# Patient Record
Sex: Female | Born: 1950
Health system: Southern US, Community
[De-identification: ages and names within clinical notes are randomized; demographics above are authoritative.]

## PROBLEM LIST (undated history)

## (undated) DIAGNOSIS — M25552 Pain in left hip: Secondary | ICD-10-CM

## (undated) DIAGNOSIS — I519 Heart disease, unspecified: Secondary | ICD-10-CM

## (undated) DIAGNOSIS — M793 Panniculitis, unspecified: Secondary | ICD-10-CM

## (undated) DIAGNOSIS — Z8711 Personal history of peptic ulcer disease: Secondary | ICD-10-CM

## (undated) DIAGNOSIS — E119 Type 2 diabetes mellitus without complications: Secondary | ICD-10-CM

## (undated) DIAGNOSIS — F419 Anxiety disorder, unspecified: Secondary | ICD-10-CM

## (undated) DIAGNOSIS — R5383 Other fatigue: Secondary | ICD-10-CM

## (undated) DIAGNOSIS — R7303 Prediabetes: Secondary | ICD-10-CM

## (undated) DIAGNOSIS — M1990 Unspecified osteoarthritis, unspecified site: Secondary | ICD-10-CM

## (undated) DIAGNOSIS — K760 Fatty (change of) liver, not elsewhere classified: Secondary | ICD-10-CM

## (undated) DIAGNOSIS — I5189 Other ill-defined heart diseases: Secondary | ICD-10-CM

## (undated) DIAGNOSIS — E739 Lactose intolerance, unspecified: Secondary | ICD-10-CM

## (undated) DIAGNOSIS — D649 Anemia, unspecified: Secondary | ICD-10-CM

## (undated) DIAGNOSIS — Z8719 Personal history of other diseases of the digestive system: Secondary | ICD-10-CM

## (undated) DIAGNOSIS — I10 Essential (primary) hypertension: Secondary | ICD-10-CM

## (undated) DIAGNOSIS — E041 Nontoxic single thyroid nodule: Secondary | ICD-10-CM

## (undated) DIAGNOSIS — G44309 Post-traumatic headache, unspecified, not intractable: Secondary | ICD-10-CM

## (undated) DIAGNOSIS — K5900 Constipation, unspecified: Secondary | ICD-10-CM

## (undated) DIAGNOSIS — D1779 Benign lipomatous neoplasm of other sites: Secondary | ICD-10-CM

## (undated) DIAGNOSIS — E785 Hyperlipidemia, unspecified: Secondary | ICD-10-CM

## (undated) DIAGNOSIS — M549 Dorsalgia, unspecified: Secondary | ICD-10-CM

## (undated) DIAGNOSIS — S069X9A Unspecified intracranial injury with loss of consciousness of unspecified duration, initial encounter: Secondary | ICD-10-CM

## (undated) DIAGNOSIS — E669 Obesity, unspecified: Secondary | ICD-10-CM

## (undated) DIAGNOSIS — F329 Major depressive disorder, single episode, unspecified: Secondary | ICD-10-CM

## (undated) DIAGNOSIS — G43909 Migraine, unspecified, not intractable, without status migrainosus: Secondary | ICD-10-CM

## (undated) DIAGNOSIS — K219 Gastro-esophageal reflux disease without esophagitis: Secondary | ICD-10-CM

## (undated) DIAGNOSIS — S0990XS Unspecified injury of head, sequela: Secondary | ICD-10-CM

## (undated) DIAGNOSIS — F32A Depression, unspecified: Secondary | ICD-10-CM

## (undated) DIAGNOSIS — Z5189 Encounter for other specified aftercare: Secondary | ICD-10-CM

## (undated) DIAGNOSIS — J45909 Unspecified asthma, uncomplicated: Secondary | ICD-10-CM

## (undated) DIAGNOSIS — C187 Malignant neoplasm of sigmoid colon: Secondary | ICD-10-CM

## (undated) DIAGNOSIS — J189 Pneumonia, unspecified organism: Secondary | ICD-10-CM

## (undated) DIAGNOSIS — R0600 Dyspnea, unspecified: Secondary | ICD-10-CM

## (undated) DIAGNOSIS — S069XAA Unspecified intracranial injury with loss of consciousness status unknown, initial encounter: Secondary | ICD-10-CM

## (undated) DIAGNOSIS — E559 Vitamin D deficiency, unspecified: Secondary | ICD-10-CM

## (undated) DIAGNOSIS — E269 Hyperaldosteronism, unspecified: Secondary | ICD-10-CM

## (undated) DIAGNOSIS — Z87442 Personal history of urinary calculi: Secondary | ICD-10-CM

## (undated) DIAGNOSIS — T7840XA Allergy, unspecified, initial encounter: Secondary | ICD-10-CM

## (undated) DIAGNOSIS — E079 Disorder of thyroid, unspecified: Secondary | ICD-10-CM

## (undated) HISTORY — DX: Anxiety disorder, unspecified: F41.9

## (undated) HISTORY — DX: Personal history of other diseases of the digestive system: Z87.19

## (undated) HISTORY — DX: Personal history of peptic ulcer disease: Z87.11

## (undated) HISTORY — PX: MYOMECTOMY: SHX85

## (undated) HISTORY — PX: FACET JOINT INJECTION: SHX5016

## (undated) HISTORY — DX: Unspecified osteoarthritis, unspecified site: M19.90

## (undated) HISTORY — DX: Constipation, unspecified: K59.00

## (undated) HISTORY — DX: Major depressive disorder, single episode, unspecified: F32.9

## (undated) HISTORY — DX: Dorsalgia, unspecified: M54.9

## (undated) HISTORY — DX: Hyperaldosteronism, unspecified: E26.9

## (undated) HISTORY — DX: Hyperlipidemia, unspecified: E78.5

## (undated) HISTORY — DX: Type 2 diabetes mellitus without complications: E11.9

## (undated) HISTORY — DX: Depression, unspecified: F32.A

## (undated) HISTORY — DX: Encounter for other specified aftercare: Z51.89

## (undated) HISTORY — PX: TUBAL LIGATION: SHX77

## (undated) HISTORY — PX: ABDOMINAL HYSTERECTOMY: SHX81

## (undated) HISTORY — DX: Obesity, unspecified: E66.9

## (undated) HISTORY — DX: Unspecified asthma, uncomplicated: J45.909

## (undated) HISTORY — DX: Allergy, unspecified, initial encounter: T78.40XA

## (undated) HISTORY — DX: Migraine, unspecified, not intractable, without status migrainosus: G43.909

## (undated) HISTORY — DX: Lactose intolerance, unspecified: E73.9

## (undated) HISTORY — DX: Gastro-esophageal reflux disease without esophagitis: K21.9

---

## 1987-11-29 HISTORY — PX: THYROIDECTOMY: SHX17

## 2001-11-28 HISTORY — PX: BREAST SURGERY: SHX581

## 2002-05-15 ENCOUNTER — Emergency Department (HOSPITAL_COMMUNITY): Admission: EM | Admit: 2002-05-15 | Discharge: 2002-05-15 | Payer: Self-pay | Admitting: Emergency Medicine

## 2002-07-08 ENCOUNTER — Emergency Department (HOSPITAL_COMMUNITY): Admission: EM | Admit: 2002-07-08 | Discharge: 2002-07-08 | Payer: Self-pay | Admitting: Emergency Medicine

## 2007-10-12 ENCOUNTER — Ambulatory Visit: Payer: Self-pay | Admitting: Gastroenterology

## 2007-10-22 ENCOUNTER — Ambulatory Visit: Payer: Self-pay | Admitting: Gastroenterology

## 2008-01-24 ENCOUNTER — Ambulatory Visit (HOSPITAL_COMMUNITY): Admission: RE | Admit: 2008-01-24 | Discharge: 2008-01-24 | Payer: Self-pay | Admitting: Neurology

## 2008-02-20 ENCOUNTER — Ambulatory Visit (HOSPITAL_COMMUNITY): Admission: RE | Admit: 2008-02-20 | Discharge: 2008-02-20 | Payer: Self-pay | Admitting: Neurology

## 2008-07-04 ENCOUNTER — Emergency Department (HOSPITAL_COMMUNITY): Admission: EM | Admit: 2008-07-04 | Discharge: 2008-07-04 | Payer: Self-pay | Admitting: Emergency Medicine

## 2008-11-28 HISTORY — PX: CHALAZION EXCISION: SHX213

## 2009-01-09 ENCOUNTER — Observation Stay (HOSPITAL_COMMUNITY): Admission: EM | Admit: 2009-01-09 | Discharge: 2009-01-11 | Payer: Self-pay | Admitting: Emergency Medicine

## 2009-03-28 HISTORY — PX: NM MYOCAR PERF WALL MOTION: HXRAD629

## 2009-03-28 HISTORY — PX: TRANSTHORACIC ECHOCARDIOGRAM: SHX275

## 2009-10-08 ENCOUNTER — Emergency Department (HOSPITAL_COMMUNITY): Admission: EM | Admit: 2009-10-08 | Discharge: 2009-10-08 | Payer: Self-pay | Admitting: Emergency Medicine

## 2011-03-15 LAB — PHOSPHORUS
Phosphorus: 3.4 mg/dL (ref 2.3–4.6)
Phosphorus: 4 mg/dL (ref 2.3–4.6)

## 2011-03-15 LAB — BASIC METABOLIC PANEL
BUN: 10 mg/dL (ref 6–23)
BUN: 12 mg/dL (ref 6–23)
BUN: 14 mg/dL (ref 6–23)
CO2: 27 mEq/L (ref 19–32)
Calcium: 8.9 mg/dL (ref 8.4–10.5)
Chloride: 104 mEq/L (ref 96–112)
Chloride: 107 mEq/L (ref 96–112)
Creatinine, Ser: 0.59 mg/dL (ref 0.4–1.2)
GFR calc non Af Amer: >60 mL/min (ref >60)
Glucose, Bld: 104 mg/dL — ABNORMAL HIGH (ref 70–99)
Glucose, Bld: 113 mg/dL — ABNORMAL HIGH (ref 70–99)
Glucose, Bld: 88 mg/dL (ref 70–99)
Potassium: 3.4 mEq/L — ABNORMAL LOW (ref 3.5–5.1)
Potassium: 3.5 mEq/L (ref 3.5–5.1)

## 2011-03-15 LAB — LIPID PANEL
LDL Cholesterol: 92 mg/dL (ref 0–99)
Total CHOL/HDL Ratio: 4.1 RATIO
Triglycerides: 181 mg/dL — ABNORMAL HIGH (ref <150)
VLDL: 36 mg/dL (ref 0–40)

## 2011-03-15 LAB — DIFFERENTIAL
Basophils Absolute: 0 10*3/uL (ref 0.0–0.1)
Eosinophils Relative: 3 % (ref 0–5)
Lymphocytes Relative: 42 % (ref 12–46)
Lymphs Abs: 1.6 10*3/uL (ref 0.7–4.0)
Neutro Abs: 1.6 10*3/uL — ABNORMAL LOW (ref 1.7–7.7)
Neutrophils Relative %: 43 % (ref 43–77)

## 2011-03-15 LAB — CBC
HCT: 33.7 % — ABNORMAL LOW (ref 36.0–46.0)
HCT: 34.9 % — ABNORMAL LOW (ref 36.0–46.0)
MCV: 88.1 fL (ref 78.0–100.0)
MCV: 88.7 fL (ref 78.0–100.0)
Platelets: 229 10*3/uL (ref 150–400)
Platelets: 249 10*3/uL (ref 150–400)
Platelets: 269 10*3/uL (ref 150–400)
RDW: 13.2 % (ref 11.5–15.5)
RDW: 13.5 % (ref 11.5–15.5)
WBC: 3.6 10*3/uL — ABNORMAL LOW (ref 4.0–10.5)
WBC: 3.8 10*3/uL — ABNORMAL LOW (ref 4.0–10.5)

## 2011-03-15 LAB — POCT CARDIAC MARKERS: CKMB, poc: <1 ng/mL — ABNORMAL LOW (ref 1.0–8.0)

## 2011-03-15 LAB — URINALYSIS, ROUTINE W REFLEX MICROSCOPIC
Ketones, ur: NEGATIVE mg/dL
Nitrite: NEGATIVE
Specific Gravity, Urine: 1.01 (ref 1.005–1.030)
pH: 6.5 (ref 5.0–8.0)

## 2011-03-15 LAB — TSH: TSH: 0.62 u[IU]/mL (ref 0.350–4.500)

## 2011-03-15 LAB — CARDIAC PANEL(CRET KIN+CKTOT+MB+TROPI)
CK, MB: 0.9 ng/mL (ref 0.3–4.0)
Relative Index: 0.8 (ref 0.0–2.5)

## 2011-03-15 LAB — PROTIME-INR
INR: 1.1 (ref 0.00–1.49)
Prothrombin Time: 14.3 seconds (ref 11.6–15.2)

## 2011-03-15 LAB — HEPATIC FUNCTION PANEL
ALT: 20 U/L (ref 0–35)
Alkaline Phosphatase: 74 U/L (ref 39–117)
Bilirubin, Direct: <0.1 mg/dL (ref 0.0–0.3)

## 2011-03-15 LAB — HEMOGLOBIN A1C: Mean Plasma Glucose: 120 mg/dL

## 2011-04-12 NOTE — H&P (Signed)
[name], [name] NO.:  [id number]   MEDICAL RECORD NO.:  [id number]          PATIENT TYPE:  OBV   LOCATION:                               FACILITY:  [facility]   PHYSICIAN:  Herbie Saxon, MDDATE OF BIRTH:  01/01/1968   DATE OF ADMISSION:  01/09/2009  DATE OF DISCHARGE:  01/11/2009                              HISTORY & PHYSICAL   PRIMARY CARE PHYSICIAN:  Pearline Cables, MD.   She has no assigned health care power of attorney and she is a full  code.   PRESENTING COMPLAINTS:  Left-sided chest pain, shortness of breath and  headache 1 day's duration.   HISTORY OF PRESENT ILLNESS:  This 60 year old African American female  has been experiencing intermittent left-sided chest pain for the last 1  week.  Per the patient, overnight she had severe intensity left-sided  chest pain, 9-10/10, tightening and dull and pressure-like in nature  radiating to her left arm, associated with shortness of breath,  dizziness, no diaphoresis, some sensation of palpitation.  No syncopal  episode.  Patient also noticed increasing headache.  She denies any body  swelling, no orthopnea, paroxysmal nocturnal dyspnea.  She has not been  having increasing heartburn or abdominal discomfort.  No change in bowel  habits.  No symptoms refferable to the genitourinary systems.  Patient  denies any cough, wheezing or hemoptysis.  There is no new joint  swelling or skin rash.   FAMILY HISTORY:  There is a strong family of coronary artery disease;  mother, myocardial infarction at age 87; uncle, myocardial infarction at  age 60; and grandmother, myocardial infarction at age 45.   PAST SURGICAL HISTORY:  Nil of note.   PAST MEDICAL HISTORY:  1. Hypertension diagnosed 20 years ago.  2. Migraines diagnosed 1-1/2 years ago.   SOCIAL HISTORY:  She lives with her children.  Currently on sick-leave.  No history of alcohol or tobacco, no drug abuse.   FAMILY HISTORY:  As stated, mother  - myocardial infarction, uncle -  myocardial infarction and grandmother - myocardial infarction.   REVIEW OF SYSTEMS:  A 14-point review of systems was performed with the  patient.  Pertinent positives as in the history of presenting  complaints.   ALLERGIES:  No known drug allergies.   MEDICATIONS:  1. Procardia 90 mg daily.  2. Pristiq 100 mg daily.  3. Keppra 500 mg b.i.d.  4. Seroquel 25 mg daily.  5. Etodolac 400-800 mg daily as needed.  6. Levoxyl 0.88 mcg daily.   PHYSICAL EXAMINATION:  VITAL SIGNS:  Temperature 98, pulse 80,  respiratory rate 20, blood pressure 136/63.  GENERAL APPEARANCE:  She is a middle-aged lady not in acute respiratory  distress.  HEENT:  Head is atraumatic and normocephalic.  Mucous membranes are  moist.  Pupils are equal, reactive to light and accommodation.  Extraocular muscles are intact.  No appreciated asymmetry.  Oropharynx  is clear.  NECK:  Supple.  No lymphadenopathy.  No thyromegaly.  No elevated JVD.  CHEST WALL:  No deformities, no tenderness.  Bilateral air entry is  fair, no rales or rhonchi.  CARDIOVASCULAR:  Heart sounds 1 and 2, regular rate and rhythm.  No  murmurs, rubs, or gallops.  Apex beat is in the fifth intercostal space  lateral to the mid clavicular line.  No parasternal heave.  ABDOMEN:  Soft, nontender, bowel sounds present, no organomegaly, no  inguinal hernia.  EXTREMITIES:  Peripheral pulses with no pedal edema, no calf tenderness,  no erythema, clubbing or cyanosis.  NEUROLOGIC:  She is alert and oriented to time, place and person.  Cranial nerves II-XII intact.  Power is 5 in all limbs. Deep tendon  reflexes 2 globally.  Sensory system normal.   LABORATORY DATA:  WBC 3.8, hematocrit 39, platelet count 269.   EKG shows normal sinus rhythm  with moderate left ventricular  hypertrophy.  Initial troponin is less than 0.05.   Sodium 134, potassium 3.5, chloride 108, bicarbonate 26, BUN 10,  creatinine 0.59,  glucose 104.  Urinalysis is negative.   Chest x-ray shows linear lingular scarring, borderline cardiomegaly.   ASSESSMENT:  1. Atypical chest pain query acute coronary syndrome.  2. Migraine.  3. Mild neutropenia.  4. Depression.  5. Family history of coronary artery disease.  6. Hypertension.  7. Obesity.  8. Anxiety.   PLAN:  Patient will be admitted to telemetry bed the initial 24 hours.  Will obtain serial cardiac enzymes and EKG.  Patient will be sent for an  echocardiogram.  We will check fasting lipids, thyroid function,  coagulation parameters.   DIET:  Heart healthy.   ACTIVITY:  Bed rest.   MEDICATIONS:  1. Morphine IV p.r.n. for severe chest pain.  2. Use of Proventil to keep sats greater than 90.  3. Nitroglycerin paste half inch q.6 h.  Hold in between 6 a.m. to      noon daily.  4. Aspirin 325 mg daily.  5. Lovenox 40 mg subcu daily for DVT prophylaxis.  6. Protonix 40 mg IV daily.  7. IV fluid to keep vein open.  8. Continue home medications except for Procardia which will be held.  9. Will start on metoprolol 25 mg p.o. b.i.d. and lisinopril 5 mg      daily.  10.Lopressor 2.5 mg IV q.6 h p.r.n. will be given if blood pressure      greater than 160/110 or irregular heart rate greater than 110 per      minute.  11.DuoNeb on unit dose q.6 h p.r.n. shortness of breath.   The patient's illness, medication, tests and treatment plan explained to  her, she verbalizes understanding.   ENCOUNTER TIME:  50 minutes.      Herbie Saxon, MD  Electronically Signed     MIO/MEDQ  D:  01/09/2009  T:  01/09/2009  Job:  [id number]

## 2011-04-12 NOTE — Procedures (Signed)
EEG NUMBER:  [id number]   HISTORY:  This is a 60 year old patient with a history of a right  occipital concussion, having trouble with memory loss.  This is a sleep-  deprived EEG recording.  No skull defects were noted.   EEG CLASSIFICATION:  Normal awake and asleep.   DESCRIPTION OF RECORDING:  The background rhythm of this recording  consists of a fairly well-modulated, medium-amplitude alpha rhythm of 8  Hz that is reactive to eye opening and closure.  As the record  progresses, the patient initially is in the waking state.  Photic  stimulation is performed, resulting in a bilateral and symmetric photic  driving response.  Hyperventilation is then performed, resulting in a  buildup of background rhythm activities with minimal background slowing  seen.  As the record progresses, the patient towards the end of the  recording enters early stage II sleep with rudimentary sleep spindles  and K complexes seen.  At no time during the recording does there appear  to be evidence of spikes, spike-wave discharges or evidence of focal  slowing.  EKG monitor shows no evidence of cardiac rhythm abnormalities  with a heart rate of 56.   IMPRESSION:  This is a normal EEG recording in the awake and sleeping  state.  No evidence of ictal or interictal discharges were seen.      Marlan Palau, M.D.  Electronically Signed     AOZ:HYQM  D:  02/20/2008 15:42:00  T:  02/21/2008 09:15:48  Job #:  [id number]

## 2011-04-12 NOTE — Procedures (Signed)
EEG NUMBER:  [id number].   REFERRING PHYSICIAN:  Rene Kocher, M.D.   CLINICAL HISTORY:  A 60 year old lady with episodes of altered awareness  following a fall in November 2008.   MEDICATIONS LISTED:  None.   This is a 17-channel EEG recorded with the patient awake and drowsy  using standard 10/20 electrode placement.   Background awake rhythm consists of 10 to 11 Hz alpha, which is of  excellent amplitude, synchronous, reactive to eye-opening and closure.  Intermittent bilateral central and temporal sharp waves are seen,  particularly during drowsiness, but no definite epileptiform activity is  noted.  Definite sleep stages are not seen except mild drowsiness, which  showed prominent sharp waves as stated above.  Length of the recording  is 23 minutes.  Technical component is excellent.  Hyperventilation and  photic stimulation are both  unremarkable.  EKG tracing reveals regular  sinus rhythm.   IMPRESSION:  This EEG performed during awake and drowsy states is  abnormal due to the presence of mild bilateral cortical irritability;  however, no definitive epileptiform features were noted.   If seizures are suspected, repeat sleep-deprived study may be  beneficial.           ______________________________  Sunny Schlein. Pearlean Brownie, MD     GEX:BMWU  D:  01/24/2008 19:10:33  T:  01/25/2008 18:03:40  Job #:  [id number]   cc:   Rene Kocher, M.D.  Fax: [phone]

## 2011-04-12 NOTE — Discharge Summary (Signed)
[name], [name]        ACCOUNT NO.:  [id number]   MEDICAL RECORD NO.:  [id number]          PATIENT TYPE:  OBV   LOCATION:  1401                         FACILITY:  [facility]   PHYSICIAN:  Herbie Saxon, MDDATE OF BIRTH:  Mar 05, 1951   DATE OF ADMISSION:  01/09/2009  DATE OF DISCHARGE:                               DISCHARGE SUMMARY   DISCHARGE DIAGNOSES:  1. Chest pain, atypical.  2. Migraine headache.  3. Mild neutropenia.  4. Depression.  5. Anxiety.  6. Mild fatty liver.  7. Family history of coronary artery disease.  8. Obesity.  9. Hypertension, stable.  10.Mild anemia of chronic disease.   RADIOLOGY:  The chest x-ray of January 09, 2009, shows borderline  cardiomegaly with linear lingular scarring or atelectasis.  Abdominal  ultrasound of January 11, 2009, shows a questionable mild fatty  infiltration of the liver.   HOSPITAL COURSE:  This 60 year old lady with a history of hypertension  and migraine headache, presented with left-sided chest pain, shortness  of breath, and headache of 1 day duration.  An initial EKG on this  admission shows moderate left ventricular hypertrophy; however, serial  cardiac enzymes and EKG have been negative.  Hemoccult has also been  negative.  A two-D echo is pending.  The patient is chest pain free and  there is no shortness of breath.  A lipid panel did show  hyperglyceridemia and she has been started on Niacin and aspirin for  this.  Migraine headache has improved with Fioricet p.r.n.  She did have  hypokalemia, which has been supplemented.  She is being discharged home  to follow up with her primary care physician in the next 1 to 3 days.  The primary care physician will arrange outpatient stress echo and  Cardiology followup as an outpatient.   CONDITION ON DISCHARGE:  Stable.   DIET:  Heart healthy, low cholesterol.   ACTIVITY:  To be increased slowly as tolerated.  The patient to report  to the emergency room  in the case of recurrence of any chest pain,  shortness of breath, or diaphoresis.  Follow up with her primary care  physician, Dr. Shanda Bumps Copland in the next 1 to 3 days.   MEDICATIONS ON DISCHARGE:  1. Nitroglycerin 0.4 mg sublingual p.r.n.  2. Enteric-coated aspirin 81 mg daily.  3. Prilosec 20 mg daily.  4. Lisinopril 5 mg daily.  5. Toprol-XL 25 mg daily.  6. Fioricet 1 to 2 tablets q.6 hours p.r.n.  7. Niacin 100 mg b.i.d.   Continue with her home medications of:  1. Pristiq 100 mg daily.  2. Keppra 500 mg b.i.d.  3. Seroquel 25 mg daily.  4. Etodolac 400 to 800 mg daily as needed.  5. Levoxyl 0.88 mg daily.   DISCHARGE PHYSICAL EXAMINATION:  GENERAL:  On examination today, she is  a middle-aged lady not in acute distress.  VITAL SIGNS:  Temperature 97.4, pulse is 60, respiratory rate is 18,  blood pressure 120/70.  HEENT:  Pupils are equal and reactive to light and accommodation.  Head  is atraumatic, normocephalic.  Mucous membranes are moist.  Oropharynx  and  nasopharynx are clear.  NECK:  Supple, no lymphadenopathy, no thyromegaly or carotid bruit, JVD  is not elevated.  CHEST:  Clinically clear, and no rales or rhonchi.  CARDIAC:  Heart sounds 1 and 2 regular rate and rhythm.  ABDOMEN:  Benign.  CNS:  No neurologic deficits.  EXTREMITIES:  Peripheral pulses present, no pedal edema.   DISCHARGE LABORATORY DATA:  Chemistry:  Sodium is 137, potassium 3.5,  chloride 104, bicarbonate 29, glucose 88, BUN 12, creatinine 0.6.  Complete blood count:  WBC is 3.6, hematocrit 33.7, platelet count is  229.  Troponin less than 0.01.  Thyroid function test is normal.  Homocystine is normal.   Discharge time less than 30 minutes.      Herbie Saxon, MD  Electronically Signed     MIO/MEDQ  D:  01/11/2009  T:  01/11/2009  Job:  [id number]

## 2011-12-12 ENCOUNTER — Ambulatory Visit (INDEPENDENT_AMBULATORY_CARE_PROVIDER_SITE_OTHER): Payer: BC Managed Care – PPO

## 2011-12-12 DIAGNOSIS — D2310 Other benign neoplasm of skin of unspecified eyelid, including canthus: Secondary | ICD-10-CM

## 2012-01-18 ENCOUNTER — Ambulatory Visit (INDEPENDENT_AMBULATORY_CARE_PROVIDER_SITE_OTHER): Payer: BC Managed Care – PPO | Admitting: Family Medicine

## 2012-01-18 VITALS — BP 150/86 | HR 60 | Temp 97.6°F | Resp 18 | Ht 63.25 in | Wt 212.0 lb

## 2012-01-18 DIAGNOSIS — E039 Hypothyroidism, unspecified: Secondary | ICD-10-CM

## 2012-01-18 DIAGNOSIS — G43909 Migraine, unspecified, not intractable, without status migrainosus: Secondary | ICD-10-CM | POA: Insufficient documentation

## 2012-01-18 DIAGNOSIS — F329 Major depressive disorder, single episode, unspecified: Secondary | ICD-10-CM | POA: Insufficient documentation

## 2012-01-18 DIAGNOSIS — K219 Gastro-esophageal reflux disease without esophagitis: Secondary | ICD-10-CM | POA: Insufficient documentation

## 2012-01-18 DIAGNOSIS — M549 Dorsalgia, unspecified: Secondary | ICD-10-CM

## 2012-01-18 DIAGNOSIS — I10 Essential (primary) hypertension: Secondary | ICD-10-CM | POA: Insufficient documentation

## 2012-01-18 MED ORDER — HYDROCODONE-ACETAMINOPHEN 5-500 MG PO TABS: 1.0000 | ORAL_TABLET | Freq: Three times a day (TID) | ORAL | Status: AC | PRN

## 2012-01-18 MED ORDER — CYCLOBENZAPRINE HCL 10 MG PO TABS: 10.0000 mg | ORAL_TABLET | Freq: Two times a day (BID) | ORAL | Status: AC | PRN

## 2012-01-18 MED ORDER — PREDNISONE 20 MG PO TABS: ORAL_TABLET | ORAL | Status: DC

## 2012-01-18 NOTE — Patient Instructions (Signed)
Call Dr. Juanda Chance office asap- [phone] ([facility]) and schedule an appointment.   We have prescribed you flexeril and vicodin (hydrocodone) today.  Remember that these medications can make you sleepy.  Do not take other narcotic or tylenol containing pain medications while you are taking the vicodin.

## 2012-01-18 NOTE — Progress Notes (Signed)
  Patient Name: [name] Date of Birth: [birth year] Medical Record Number: [id number] Gender: female Date of Encounter: 01/18/2012  History of Present Illness:  [name] is a 61 y.o. very pleasant female patient who presents with the following:  Continues to have severe lower back pain.  Has a history of facet disease and has received injections by Dr. Regino Schultze in the past.  Was here in January- received tramadol which helps some but she still wakes up in the night and needs to take another.  Pain has been worse for the last month or so.  His seems very similar to the pain that took her to Dr. Regino Schultze before.  No radiation of pain down her legs, no numbness or tingling.  Riding in a car is painful because of "the vibrations."  Is currently using ultram and robaxim for her symptoms.  Pain is especially bad if she has to change position.  Also had tried some hydrocodone which was left over from a foot operation- this relieved her pain better.   Patient Active Problem List  Diagnoses  . Hypothyroidism (acquired)  . Hypertension  . GERD (gastroesophageal reflux disease)  . Depression  . Migraine   History reviewed. No pertinent past medical history. Past Surgical History  Procedure Date  . Facet joint injection 2011 and 2012  . Breast surgery 2003    reduction  . Thyroidectomy 1989  . Tubal ligation   . Abdominal hysterectomy   . Myomectomy    History  Substance Use Topics  . Smoking status: Never Smoker   . Smokeless tobacco: Never Used  . Alcohol Use: Yes     wine on weekends   Family History  Problem Relation Age of Onset  . Heart disease Mother   . Heart disease Maternal Grandmother   . Heart disease Maternal Grandfather   . Heart disease Paternal Grandmother   . Heart disease Paternal Grandfather    Allergies  Allergen Reactions  . Toprol Xl (Metoprolol Succinate)     Medication list has been reviewed and updated.  Review of Systems: As per  HPI- otherwise negative.  Physical Examination: Filed Vitals:   01/18/12 1044  BP: 150/86  Pulse: 60  Temp: 97.6 F (36.4 C)  TempSrc: Oral  Resp: 18  Height: 5' 3.25" (1.607 m)  Weight: 212 lb (96.163 kg)    Body mass index is 37.26 kg/(m^2).  GEN: WDWN, NAD, Non-toxic, A & O x 3 HEENT: Atraumatic, Normocephalic. Neck supple. No masses, No LAD. Ears and Nose: No external deformity. CV: RRR, No M/G/R. No JVD. No thrill. No extra heart sounds. PULM: CTA B, no wheezes, crackles, rhonchi. No retractions. No resp. distress. No accessory muscle use. ABD: S, NT, ND, +BS. No rebound. No HSM. EXTR: No c/c/e NEURO Normal gait.  PSYCH: Normally interactive. Conversant. Not depressed or anxious appearing.  Calm demeanor.  Back: exquisite tenderness at both facet joints.    Assessment and Plan: 1. Back pain  cyclobenzaprine (FLEXERIL) 10 MG tablet, HYDROcodone-acetaminophen (VICODIN) 5-500 MG per tablet, predniSONE (DELTASONE) 20 MG tablet  2. Hypothyroidism (acquired)    3. Hypertension    4. GERD (gastroesophageal reflux disease)    5. Depression    6. Migraine     Continued facet diease as above- will treat with flexeril and vicodin as needed, and she will schedule an appt to see Dr. Regino Schultze as soon as possible.

## 2012-03-09 ENCOUNTER — Other Ambulatory Visit: Payer: Self-pay | Admitting: Family Medicine

## 2012-03-10 ENCOUNTER — Telehealth: Payer: Self-pay

## 2012-03-10 NOTE — Telephone Encounter (Signed)
.  [facility] Patient called and stated that she was supposed to have medication called into [facility] on [address] and it was not done.  No previous messages noted regarding this patient. Please call patient at [phone] regarding this request.

## 2012-03-10 NOTE — Telephone Encounter (Signed)
ADVISED PT THAT RX WAS FAXED IN 

## 2012-08-14 ENCOUNTER — Other Ambulatory Visit: Payer: Self-pay | Admitting: Family Medicine

## 2012-10-24 ENCOUNTER — Other Ambulatory Visit: Payer: Self-pay | Admitting: *Deleted

## 2012-11-05 ENCOUNTER — Telehealth: Payer: Self-pay

## 2012-11-05 NOTE — Telephone Encounter (Signed)
PT STATES SHE NEED HER 04-29-11 AND 01-09-12 VISITS OV AND ANY LABS,XRAYS OR CULTURES DONE THOSE 2 DAYS. PLEASE CALL [phone]. WAS TOLD IT COULD TAKE UP TO 72HRS.

## 2012-11-06 NOTE — Telephone Encounter (Signed)
Spoke with patient. She needed office notes related to back pain, no labs or cultures. Copied 01/18/12, 12/12/11, 04/29/11, 10/28/10, and 07/20/10 and x-ray report from 10/28/10. She already has x-ray reports from June 2011. Records ready for pickup.

## 2012-11-28 DIAGNOSIS — Z0271 Encounter for disability determination: Secondary | ICD-10-CM

## 2012-12-27 ENCOUNTER — Other Ambulatory Visit: Payer: Self-pay | Admitting: Physician Assistant

## 2013-01-13 ENCOUNTER — Emergency Department (HOSPITAL_COMMUNITY)
Admission: EM | Admit: 2013-01-13 | Discharge: 2013-01-13 | Disposition: A | Discharge status: Home / Self Care | Payer: Worker's Compensation | Attending: Emergency Medicine | Admitting: Emergency Medicine

## 2013-01-13 ENCOUNTER — Encounter (HOSPITAL_COMMUNITY): Payer: Self-pay | Admitting: Emergency Medicine

## 2013-01-13 DIAGNOSIS — Z8679 Personal history of other diseases of the circulatory system: Secondary | ICD-10-CM | POA: Insufficient documentation

## 2013-01-13 DIAGNOSIS — Z87828 Personal history of other (healed) physical injury and trauma: Secondary | ICD-10-CM | POA: Insufficient documentation

## 2013-01-13 DIAGNOSIS — E079 Disorder of thyroid, unspecified: Secondary | ICD-10-CM | POA: Insufficient documentation

## 2013-01-13 DIAGNOSIS — Z79899 Other long term (current) drug therapy: Secondary | ICD-10-CM | POA: Insufficient documentation

## 2013-01-13 DIAGNOSIS — Z7982 Long term (current) use of aspirin: Secondary | ICD-10-CM | POA: Insufficient documentation

## 2013-01-13 DIAGNOSIS — Z791 Long term (current) use of non-steroidal anti-inflammatories (NSAID): Secondary | ICD-10-CM | POA: Insufficient documentation

## 2013-01-13 DIAGNOSIS — G43909 Migraine, unspecified, not intractable, without status migrainosus: Secondary | ICD-10-CM | POA: Insufficient documentation

## 2013-01-13 DIAGNOSIS — I10 Essential (primary) hypertension: Secondary | ICD-10-CM | POA: Insufficient documentation

## 2013-01-13 DIAGNOSIS — K219 Gastro-esophageal reflux disease without esophagitis: Secondary | ICD-10-CM | POA: Insufficient documentation

## 2013-01-13 HISTORY — DX: Essential (primary) hypertension: I10

## 2013-01-13 HISTORY — DX: Unspecified injury of head, sequela: S09.90XS

## 2013-01-13 HISTORY — DX: Disorder of thyroid, unspecified: E07.9

## 2013-01-13 HISTORY — DX: Unspecified injury of head, sequela: G44.309

## 2013-01-13 MED ORDER — DIPHENHYDRAMINE HCL 50 MG/ML IJ SOLN
25.0000 mg | Freq: Once | INTRAMUSCULAR | Status: AC
  Administered 2013-01-13: 25 mg via INTRAVENOUS
  Filled 2013-01-13: qty 1

## 2013-01-13 MED ORDER — METOCLOPRAMIDE HCL 5 MG/ML IJ SOLN
10.0000 mg | Freq: Once | INTRAMUSCULAR | Status: AC
  Administered 2013-01-13: 10 mg via INTRAVENOUS
  Filled 2013-01-13: qty 2

## 2013-01-13 MED ORDER — HYDROMORPHONE HCL PF 1 MG/ML IJ SOLN
1.0000 mg | Freq: Once | INTRAMUSCULAR | Status: AC
  Administered 2013-01-13: 1 mg via INTRAVENOUS
  Filled 2013-01-13: qty 1

## 2013-01-13 MED ORDER — DEXAMETHASONE SODIUM PHOSPHATE 10 MG/ML IJ SOLN
10.0000 mg | Freq: Once | INTRAMUSCULAR | Status: AC
  Administered 2013-01-13: 10 mg via INTRAVENOUS
  Filled 2013-01-13: qty 1

## 2013-01-13 MED ORDER — SODIUM CHLORIDE 0.9 % IV BOLUS (SEPSIS)
1000.0000 mL | Freq: Once | INTRAVENOUS | Status: AC
  Administered 2013-01-13: 1000 mL via INTRAVENOUS

## 2013-01-13 NOTE — ED Notes (Signed)
Patient states that she has had a MHA x 4 -5 days. Has tried all of her own medications. The patient reports Nausea and typical MHA Symtoms

## 2013-01-13 NOTE — ED Provider Notes (Signed)
History     CSN: [id number]  Arrival date & time 01/13/13  1251   First MD Initiated Contact with Patient 01/13/13 1342      Chief Complaint  Patient presents with  . Migraine    (Consider location/radiation/quality/duration/timing/severity/associated sxs/prior treatment) HPI  62 year-old female with history of headache due to an old head injury presents complaining of worsening headache. Patient reports she usually has headache 3 or 4 times weekly. Her headaches usually improves with taking her usual headache cocktail. For the past 4 or 5 days she is having worsening headache that has not improved with her home medication. Describe headaches as a sharp and throbbing sensation to the right parietal region, nonradiating, persistent, moderate in severity, with associate nausea. She has cold symptoms prior to having headache including runny nose, sneezing, and nonproductive cough which has resolved. She denies fever, chills, double vision, next deafness, vomiting, diarrhea, or rash. Headache feels similar to her usual headache.  Past Medical History  Diagnosis Date  . Headaches due to old head injury   . Hypertension   . Thyroid disease   . Reflux     Past Surgical History  Procedure Laterality Date  . Facet joint injection  2011 and 2012  . Breast surgery  2003    reduction  . Thyroidectomy  1989  . Tubal ligation    . Abdominal hysterectomy    . Myomectomy      Family History  Problem Relation Age of Onset  . Heart disease Mother   . Heart disease Maternal Grandmother   . Heart disease Maternal Grandfather   . Heart disease Paternal Grandmother   . Heart disease Paternal Grandfather     History  Substance Use Topics  . Smoking status: Never Smoker   . Smokeless tobacco: Never Used  . Alcohol Use: Yes     Comment: wine on weekends    OB History   Grav Para Term Preterm Abortions TAB SAB Ect Mult Living                  Review of Systems  Constitutional:      10 Systems reviewed and all are negative for acute change except as noted in the HPI.     Allergies  Toprol xl  Home Medications   Current Outpatient Rx  Name  Route  Sig  Dispense  Refill  . aspirin 81 MG tablet   Oral   Take 81 mg by mouth daily.         . butalbital-acetaminophen-caffeine (FIORICET WITH CODEINE) 50-325-40-30 MG per capsule   Oral   Take 1 capsule by mouth every 4 (four) hours as needed.         . desvenlafaxine (PRISTIQ) 100 MG 24 hr tablet   Oral   Take 100 mg by mouth daily.         . diclofenac sodium (VOLTAREN) 1 % GEL   Topical   Apply topically.         . gabapentin (NEURONTIN) 300 MG capsule   Oral   Take 300 mg by mouth 3 (three) times daily.         Marland Kitchen HYDROcodone-acetaminophen (VICODIN) 5-500 MG per tablet      TAKE 1 TABLET BY MOUTH EVERY 8 HOURS AS NEEDED FOR PAIN   30 tablet   0   . indomethacin (INDOCIN SR) 75 MG CR capsule   Oral   Take 75 mg by mouth 2 (two) times daily  with a meal.         . levothyroxine (SYNTHROID, LEVOTHROID) 100 MCG tablet   Oral   Take 100 mcg by mouth daily.         Marland Kitchen losartan (COZAAR) 100 MG tablet      TAKE 1 TABLET BY MOUTH DAILY   30 tablet   0     Needs office visit   . losartan (COZAAR) 50 MG tablet   Oral   Take 100 mg by mouth daily.         Marland Kitchen NIFEdipine (PROCARDIA XL/ADALAT-CC) 60 MG 24 hr tablet   Oral   Take 60 mg by mouth daily.         . nitroGLYCERIN (NITROSTAT) 0.4 MG SL tablet   Sublingual   Place 0.4 mg under the tongue every 5 (five) minutes as needed.         Marland Kitchen omega-3 acid ethyl esters (LOVAZA) 1 G capsule   Oral   Take 1 g by mouth 2 (two) times daily.         Marland Kitchen omeprazole (PRILOSEC) 40 MG capsule   Oral   Take 40 mg by mouth daily.         . phentermine 37.5 MG capsule   Oral   Take 37.5 mg by mouth every morning.         . predniSONE (DELTASONE) 20 MG tablet      Take two daily for 3 days, then one daily for 3 days   9 tablet    0   . tiZANidine (ZANAFLEX) 4 MG capsule   Oral   Take 4 mg by mouth 3 (three) times daily.           BP 140/70  Pulse 69  Temp(Src) 98.4 F (36.9 C) (Oral)  Resp 16  SpO2 100%  Physical Exam  Nursing note and vitals reviewed. Constitutional: She is oriented to person, place, and time. She appears well-developed and well-nourished. No distress.  Nontoxic appearance  HENT:  Head: Normocephalic and atraumatic.  Right Ear: External ear normal.  Left Ear: External ear normal.  Nose: Nose normal.  Mouth/Throat: Oropharynx is clear and moist.  Eyes: Conjunctivae and EOM are normal. Pupils are equal, round, and reactive to light.  Neck: Normal range of motion. Neck supple.  No nuchal rigidity  Neurological: She is alert and oriented to person, place, and time. No cranial nerve deficit. Coordination normal. GCS eye subscore is 4. GCS verbal subscore is 5. GCS motor subscore is 6.  Skin: Skin is warm. No rash noted.  Psychiatric: She has a normal mood and affect.    ED Course  Procedures (including critical care time)  Labs Reviewed - No data to display No results found.   No diagnosis found.  1:58 PM Patient was seen and evaluated by me for headache. Her headache appears to be chronic in nature, likely worsened after having a recent cold symptoms. She has no evidence concerning for meningitis, or stroke. She appears nontoxic. Plan to give patient migraine cocktail and will continue to monitor. She is afebrile with stable normal vital sign.  4:35 PM Patient felt much better after receiving her migraine cocktail. She is stable for discharge. Patient will follow up with her Dr. for further management.  BP 196/102  Pulse 71  Temp(Src) 98 F (36.7 C) (Oral)  Resp 20  SpO2 97%  I have reviewed nursing notes and vital signs.  I reviewed available ER/hospitalization records thought  the EMR  1. migraine MDM          Fayrene Helper, PA-C 01/13/13 1635

## 2013-01-14 NOTE — ED Provider Notes (Signed)
Medical screening examination/treatment/procedure(s) were performed by non-physician practitioner and as supervising physician I was immediately available for consultation/collaboration.  David H Yao, MD 01/14/13 0659 

## 2013-01-31 ENCOUNTER — Ambulatory Visit (INDEPENDENT_AMBULATORY_CARE_PROVIDER_SITE_OTHER): Payer: BC Managed Care – PPO | Admitting: Family Medicine

## 2013-01-31 ENCOUNTER — Other Ambulatory Visit: Payer: Self-pay | Admitting: Physician Assistant

## 2013-01-31 VITALS — BP 138/82 | HR 76 | Temp 97.1°F | Resp 16 | Ht 63.5 in | Wt 213.0 lb

## 2013-01-31 DIAGNOSIS — I10 Essential (primary) hypertension: Secondary | ICD-10-CM

## 2013-01-31 DIAGNOSIS — M545 Low back pain, unspecified: Secondary | ICD-10-CM

## 2013-01-31 DIAGNOSIS — E889 Metabolic disorder, unspecified: Secondary | ICD-10-CM

## 2013-01-31 DIAGNOSIS — E7800 Pure hypercholesterolemia, unspecified: Secondary | ICD-10-CM

## 2013-01-31 LAB — LIPID PANEL
LDL Cholesterol: 130 mg/dL — ABNORMAL HIGH (ref 0–99)
VLDL: 21 mg/dL (ref 0–40)

## 2013-01-31 LAB — COMPREHENSIVE METABOLIC PANEL
ALT: 16 U/L (ref 0–35)
AST: 17 U/L (ref 0–37)
Albumin: 4.2 g/dL (ref 3.5–5.2)
Alkaline Phosphatase: 69 U/L (ref 39–117)
Calcium: 9.4 mg/dL (ref 8.4–10.5)
Chloride: 104 mEq/L (ref 96–112)
Potassium: 3.6 mEq/L (ref 3.5–5.3)
Sodium: 142 mEq/L (ref 135–145)

## 2013-01-31 LAB — POCT CBC
Granulocyte percent: 52.9 %G (ref 37–80)
MID (cbc): 0.3 (ref 0–0.9)
MPV: 9.1 fL (ref 0–99.8)
POC Granulocyte: 2.1 (ref 2–6.9)
POC LYMPH PERCENT: 39.5 %L (ref 10–50)
POC MID %: 7.6 %M (ref 0–12)
Platelet Count, POC: 305 10*3/uL (ref 142–424)
RBC: 4.49 M/uL (ref 4.04–5.48)
RDW, POC: 14 %

## 2013-01-31 MED ORDER — OXYCODONE-ACETAMINOPHEN 5-325 MG PO TABS: 1.0000 | ORAL_TABLET | Freq: Three times a day (TID) | ORAL | Status: DC | PRN

## 2013-01-31 MED ORDER — LEVOTHYROXINE SODIUM 100 MCG PO TABS: 100.0000 ug | ORAL_TABLET | Freq: Every day | ORAL | Status: DC

## 2013-01-31 NOTE — Patient Instructions (Addendum)
I will be in touch with you with the rest of your labs.  Please come and see me for a physical exam in the next few months- we are overdue.  Please bring all of the medications that you are currently taking that that visit so we can make sure your list is accurate.    remember that the percocet medication can make you drowsy- do not take it when you need to drive, and do not combine it with other sedating medications such as zanaflex, fiocet, xanax, or lunesta.  Use caution if combining it with your gabapentin as this can increase sedation

## 2013-01-31 NOTE — Progress Notes (Signed)
[facility] [address] [place] [address] [phone]  Date:  01/31/2013   Name:  [name]   DOB:  10/27/1951   MRN:  [id number]  PCP:  No primary provider on file.    Chief Complaint: Follow-up and Thyroid Problem   History of Present Illness:  [name] is a 62 y.o. very pleasant female patient who presents with the following:  She is here for a recheck.  She continues to be bothered by back pain- she has a history of facet disease, and will soon go back to ortho care as her worker's compensation case has been reopened.  She had used oxycodone in the past which did help, but she has tried to get by with aleve as of late.  However, the pain is interfering with her sleep and she would like some oxycodone if possible.   She sees Dr. Regino Schultze with [facility].  Last MRI about 6 months ago.    Her psychologist rxs her pristiq, lunesta and alprazolam.    She last ate this morning around 9am- .  It is now 3:30 pm  She admits to being somewhat unclear on what medications she is taking right now. Our last CPE was in 2011  Patient Active Problem List  Diagnosis  . Hypothyroidism (acquired)  . Hypertension  . GERD (gastroesophageal reflux disease)  . Depression  . Migraine    Past Medical History  Diagnosis Date  . Headaches due to old head injury   . Hypertension   . Thyroid disease   . Reflux   . Arthritis     Past Surgical History  Procedure Laterality Date  . Facet joint injection  2011 and 2012  . Breast surgery  2003    reduction  . Thyroidectomy  1989  . Tubal ligation    . Abdominal hysterectomy    . Myomectomy    . Cesarean section      History  Substance Use Topics  . Smoking status: Never Smoker   . Smokeless tobacco: Never Used  . Alcohol Use: Yes     Comment: wine on weekends    Family History  Problem Relation Age of Onset  . Heart disease Mother   . Heart disease Maternal Grandmother   . Heart  disease Maternal Grandfather   . Stroke Maternal Grandfather   . Heart disease Paternal Grandmother   . Stroke Paternal Grandmother   . Heart disease Paternal Grandfather   . Glaucoma Father   . Depression Daughter   . ADD / ADHD Son     Allergies  Allergen Reactions  . Botox (Botulinum Toxin Type A)   . Toprol Xl (Metoprolol Succinate)     Medication list has been reviewed and updated.  Current Outpatient Prescriptions on File Prior to Visit  Medication Sig Dispense Refill  . aspirin 81 MG tablet Take 81 mg by mouth daily.      . butalbital-acetaminophen-caffeine (FIORICET WITH CODEINE) 50-325-40-30 MG per capsule Take 1 capsule by mouth every 4 (four) hours as needed.      . desvenlafaxine (PRISTIQ) 100 MG 24 hr tablet Take 100 mg by mouth daily.      Marland Kitchen gabapentin (NEURONTIN) 300 MG capsule Take 300 mg by mouth 3 (three) times daily.      Marland Kitchen levothyroxine (SYNTHROID, LEVOTHROID) 100 MCG tablet Take 100 mcg by mouth daily.      Marland Kitchen losartan (COZAAR) 100 MG tablet TAKE 1 TABLET BY  MOUTH DAILY  30 tablet  0  . NIFEdipine (PROCARDIA XL/ADALAT-CC) 60 MG 24 hr tablet Take 60 mg by mouth daily.      Marland Kitchen omega-3 acid ethyl esters (LOVAZA) 1 G capsule Take 1 g by mouth 2 (two) times daily.      Marland Kitchen omeprazole (PRILOSEC) 40 MG capsule Take 40 mg by mouth daily.      Marland Kitchen tiZANidine (ZANAFLEX) 4 MG capsule Take 4 mg by mouth 3 (three) times daily.      . diclofenac sodium (VOLTAREN) 1 % GEL Apply topically.      Marland Kitchen HYDROcodone-acetaminophen (VICODIN) 5-500 MG per tablet TAKE 1 TABLET BY MOUTH EVERY 8 HOURS AS NEEDED FOR PAIN  30 tablet  0  . indomethacin (INDOCIN SR) 75 MG CR capsule Take 75 mg by mouth 2 (two) times daily with a meal.      . losartan (COZAAR) 50 MG tablet Take 100 mg by mouth daily.      . nitroGLYCERIN (NITROSTAT) 0.4 MG SL tablet Place 0.4 mg under the tongue every 5 (five) minutes as needed.      . phentermine 37.5 MG capsule Take 37.5 mg by mouth every morning.      . predniSONE  (DELTASONE) 20 MG tablet Take two daily for 3 days, then one daily for 3 days  9 tablet  0   No current facility-administered medications on file prior to visit.    Review of Systems:  As per HPI- otherwise negative.   Physical Examination: Filed Vitals:   01/31/13 1352  BP: 138/82  Pulse: 76  Temp: 97.1 F (36.2 C)  Resp: 16   Filed Vitals:   01/31/13 1352  Height: 5' 3.5" (1.613 m)  Weight: 213 lb (96.616 kg)   Body mass index is 37.13 kg/(m^2). Ideal Body Weight: Weight in (lb) to have BMI = 25: 143.1  GEN: WDWN, NAD, Non-toxic, A & O x 3, obese HEENT: Atraumatic, Normocephalic. Neck supple. No masses, No LAD. Ears and Nose: No external deformity. CV: RRR, No M/G/R. No JVD. No thrill. No extra heart sounds. PULM: CTA B, no wheezes, crackles, rhonchi. No retractions. No resp. distress. No accessory muscle use. ABD: S, NT, ND, +BS. No rebound. No HSM. EXTR: No c/c/e NEURO Normal gait.  PSYCH: Normally interactive. Conversant. Not depressed or anxious appearing.  Calm demeanor.  Back: tenderness over her lower back/ sacral area bilaterally.  Normal SLR, normal leg strength and sensation bilaterally, normal DTR.    Results for orders placed in visit on 01/31/13  POCT CBC      Result Value Range   WBC 3.9 (*) 4.6 - 10.2 K/uL   Lymph, poc 1.5  0.6 - 3.4   POC LYMPH PERCENT 39.5  10 - 50 %L   MID (cbc) 0.3  0 - 0.9   POC MID % 7.6  0 - 12 %M   POC Granulocyte 2.1  2 - 6.9   Granulocyte percent 52.9  37 - 80 %G   RBC 4.49  4.04 - 5.48 M/uL   Hemoglobin 12.7  12.2 - 16.2 g/dL   HCT, POC 19.1  47.8 - 47.9 %   MCV 91.6  80 - 97 fL   MCH, POC 28.3  27 - 31.2 pg   MCHC 30.9 (*) 31.8 - 35.4 g/dL   RDW, POC 29.5     Platelet Count, POC 305  142 - 424 K/uL   MPV 9.1  0 - 99.8 fL  Assessment and Plan: HTN (hypertension) - Plan: POCT CBC, Comprehensive metabolic panel  Unspecified hypothyroidism - Plan: TSH, levothyroxine (SYNTHROID, LEVOTHROID) 100 MCG  tablet  High cholesterol - Plan: Comprehensive metabolic panel, Lipid panel  Back pain, lumbosacral - Plan: oxyCODONE-acetaminophen (ROXICET) 5-325 MG per tablet  Refilled her synthroid today, asked her to wait to pick it up until I receive her TSH results.  Other labs pending as above.  BP ok today.  Overdue for a CPE- See patient instructions for more details.  cautioned her regarding sedation and medication interaction with her percocet.  She has used it in the past and feels able to monitor its effect on her, will use a 1/2 pill if a whole is too strong   COPLAND,JESSICA, MD

## 2013-02-01 ENCOUNTER — Encounter: Payer: Self-pay | Admitting: Family Medicine

## 2013-05-15 ENCOUNTER — Encounter: Payer: Self-pay | Admitting: Family Medicine

## 2013-05-15 ENCOUNTER — Ambulatory Visit (INDEPENDENT_AMBULATORY_CARE_PROVIDER_SITE_OTHER): Payer: BC Managed Care – PPO | Admitting: Family Medicine

## 2013-05-15 VITALS — BP 186/104 | HR 62 | Temp 97.4°F | Resp 18 | Ht 63.5 in | Wt 215.0 lb

## 2013-05-15 DIAGNOSIS — M25521 Pain in right elbow: Secondary | ICD-10-CM

## 2013-05-15 DIAGNOSIS — Z0000 Encounter for general adult medical examination without abnormal findings: Secondary | ICD-10-CM

## 2013-05-15 DIAGNOSIS — Z8742 Personal history of other diseases of the female genital tract: Secondary | ICD-10-CM

## 2013-05-15 DIAGNOSIS — I10 Essential (primary) hypertension: Secondary | ICD-10-CM

## 2013-05-15 DIAGNOSIS — B009 Herpesviral infection, unspecified: Secondary | ICD-10-CM

## 2013-05-15 DIAGNOSIS — Z87898 Personal history of other specified conditions: Secondary | ICD-10-CM

## 2013-05-15 DIAGNOSIS — D72819 Decreased white blood cell count, unspecified: Secondary | ICD-10-CM

## 2013-05-15 LAB — POCT CBC
Granulocyte percent: 59 %G (ref 37–80)
HCT, POC: 41.5 % (ref 37.7–47.9)
MCH, POC: 28.8 pg (ref 27–31.2)
MCV: 94.1 fL (ref 80–97)
MID (cbc): 0.4 (ref 0–0.9)
POC LYMPH PERCENT: 33.6 %L (ref 10–50)
Platelet Count, POC: 263 10*3/uL (ref 142–424)
RBC: 4.41 M/uL (ref 4.04–5.48)
RDW, POC: 14.4 %
WBC: 4.9 10*3/uL (ref 4.6–10.2)

## 2013-05-15 LAB — POCT URINALYSIS DIPSTICK
Blood, UA: NEGATIVE
Glucose, UA: NEGATIVE
Ketones, UA: NEGATIVE
Spec Grav, UA: 1.015
Urobilinogen, UA: 0.2

## 2013-05-15 LAB — POCT UA - MICROSCOPIC ONLY
Crystals, Ur, HPF, POC: NEGATIVE
Mucus, UA: NEGATIVE
RBC, urine, microscopic: NEGATIVE
WBC, Ur, HPF, POC: NEGATIVE

## 2013-05-15 MED ORDER — NIFEDIPINE ER OSMOTIC RELEASE 60 MG PO TB24: 60.0000 mg | ORAL_TABLET | Freq: Every day | ORAL | Status: DC

## 2013-05-15 MED ORDER — DICLOFENAC SODIUM 1 % TD GEL: 2.0000 g | Freq: Four times a day (QID) | TRANSDERMAL | Status: DC

## 2013-05-15 MED ORDER — LOSARTAN POTASSIUM 100 MG PO TABS: ORAL_TABLET | ORAL | Status: DC

## 2013-05-15 MED ORDER — VALACYCLOVIR HCL 500 MG PO TABS: 500.0000 mg | ORAL_TABLET | Freq: Two times a day (BID) | ORAL | Status: DC

## 2013-05-15 NOTE — Progress Notes (Addendum)
[facility] [address], [place] [address] [phone]  Date:  05/15/2013   Name:  [name]   DOB:  1951-10-05   MRN:  [id number]  PCP:  Abbe Amsterdam, MD    Chief Complaint: Annual Exam   History of Present Illness:  [name] is a 62 y.o. very pleasant female patient who presents with the following:  Here today for a recheck pap and CPE.  She has an LSIL pap in 2008, although she was s/p total hyst.  She then suffered a head injury which caused her to forget to follow- up her pap.  She did have a repeat pap of her vaginal cuff in 2011- it was normal.   Last seen here and had labs in March of this year.   She had her hysterectomy due to fibroids, but is aware of a prior history of abnormal pap prior to her hysterectomy.  No history of cancer that she is aware of  She notes some spots on her buttocks for the last 2 or 3 weeks- this problem seems to run in her family.  They are itchy, and will swell and seem to get infected.  This will happen to her every 6 months or so over the last few years.     She had a fall and head injury in 2008  She continues to note problems with her back pain- she has a hard time walking and would like a HD placard.  She is seeing Dr. Modesto Charon at [facility] ortho and having medial branch neurotomy; this is a procedure where her "nerve endings are burned" which is supposed to give her relief of her pain for several months.  She hopes this will help with her pain.    Her weight has gone up to 215 lbs as she is not exercising as much due to her pain.    She still sees her psychiatric nurse for medication maintenance- they continue to rx her pristiq, lunesta and alprazolam.  Overall she feels that she is doing well  Her last mammogram was about 3 weeks ago. Her last colonoscopy was in 2008- normal, 10 year recall.    She is taking losartan, but has not taken her nifedipine in some time, it seems due to  confusion.  She states her BP at home has been 160- 170/90 as of late.  She has with her a medication bag which is full of duplicate bottles- she has 5 bottles of her zanaflex.  Many of them are expired.    She recalls that her last tetanus shot was in approx 2007  Patient Active Problem List   Diagnosis Date Noted  . Hypothyroidism (acquired) 01/18/2012  . Hypertension 01/18/2012  . GERD (gastroesophageal reflux disease) 01/18/2012  . Depression 01/18/2012  . Migraine 01/18/2012    Past Medical History  Diagnosis Date  . Headaches due to old head injury   . Hypertension   . Thyroid disease   . Reflux   . Arthritis   . GERD (gastroesophageal reflux disease)   . Depression   . Allergy     Past Surgical History  Procedure Laterality Date  . Facet joint injection  2011 and 2012  . Breast surgery  2003    reduction  . Thyroidectomy  1989  . Tubal ligation    . Abdominal hysterectomy    . Myomectomy    . Cesarean section      History  Substance Use  Topics  . Smoking status: Never Smoker   . Smokeless tobacco: Never Used  . Alcohol Use: No    Family History  Problem Relation Age of Onset  . Heart disease Mother   . Heart disease Maternal Grandmother   . Heart disease Maternal Grandfather   . Stroke Maternal Grandfather   . Heart disease Paternal Grandmother   . Stroke Paternal Grandmother   . Heart disease Paternal Grandfather   . Glaucoma Father   . Diabetes Father   . Depression Daughter   . ADD / ADHD Son     Allergies  Allergen Reactions  . Botox (Botulinum Toxin Type A)   . Toprol Xl (Metoprolol Succinate)     Medication list has been reviewed and updated.  Current Outpatient Prescriptions on File Prior to Visit  Medication Sig Dispense Refill  . aspirin 81 MG tablet Take 81 mg by mouth daily.      . butalbital-acetaminophen-caffeine (FIORICET WITH CODEINE) 50-325-40-30 MG per capsule Take 1 capsule by mouth every 4 (four) hours as needed.      .  desvenlafaxine (PRISTIQ) 100 MG 24 hr tablet Take 100 mg by mouth daily.      . Diclofenac Potassium (CAMBIA PO) Take by mouth.      . diclofenac sodium (VOLTAREN) 1 % GEL Apply topically.      . gabapentin (NEURONTIN) 300 MG capsule Take 300 mg by mouth 3 (three) times daily.      Marland Kitchen levothyroxine (SYNTHROID, LEVOTHROID) 100 MCG tablet Take 1 tablet (100 mcg total) by mouth daily.  90 tablet  3  . losartan (COZAAR) 100 MG tablet TAKE 1 TABLET BY MOUTH DAILY  30 tablet  0  . omeprazole (PRILOSEC) 40 MG capsule Take 40 mg by mouth daily.      Marland Kitchen tiZANidine (ZANAFLEX) 4 MG capsule Take 4 mg by mouth 3 (three) times daily.      . indomethacin (INDOCIN SR) 75 MG CR capsule Take 75 mg by mouth 2 (two) times daily with a meal.      . NIFEdipine (PROCARDIA XL/ADALAT-CC) 60 MG 24 hr tablet Take 60 mg by mouth daily.      . nitroGLYCERIN (NITROSTAT) 0.4 MG SL tablet Place 0.4 mg under the tongue every 5 (five) minutes as needed.      Marland Kitchen omega-3 acid ethyl esters (LOVAZA) 1 G capsule Take 1 g by mouth 2 (two) times daily.      Marland Kitchen oxyCODONE-acetaminophen (ROXICET) 5-325 MG per tablet Take 1 tablet by mouth every 8 (eight) hours as needed for pain.  20 tablet  0  . phentermine 37.5 MG capsule Take 37.5 mg by mouth every morning.      . predniSONE (DELTASONE) 20 MG tablet Take two daily for 3 days, then one daily for 3 days  9 tablet  0   No current facility-administered medications on file prior to visit.    Review of Systems:  As per HPI- otherwise negative. She has many chronic complaints of pain, but nothing acute   Physical Examination: Filed Vitals:   05/15/13 0851  BP: 182/102  Pulse: 62  Temp: 97.4 F (36.3 C)  Resp: 18   Filed Vitals:   05/15/13 0851  Height: 5' 3.5" (1.613 m)  Weight: 215 lb (97.523 kg)   Body mass index is 37.48 kg/(m^2). Ideal Body Weight: Weight in (lb) to have BMI = 25: 143.1  GEN: WDWN, NAD, Non-toxic, A & O x 3, obese HEENT: Atraumatic, Normocephalic.  Neck  supple. No masses, No LAD.   Bilateral TM wnl, oropharynx normal.  PEERL,EOMI.   Ears and Nose: No external deformity. CV: RRR, No M/G/R. No JVD. No thrill. No extra heart sounds. PULM: CTA B, no wheezes, crackles, rhonchi. No retractions. No resp. distress. No accessory muscle use. ABD: S, NT, ND, +BS. No rebound. No HSM. EXTR: No c/c/e NEURO Normal gait.  PSYCH: Normally interactive. Conversant. Not depressed or anxious appearing.  Calm demeanor.  Breast; s/p reduction surgery.  No masses, discharge or dimpling GU: normal post- hysterectomy anatomy, no masses or lesions, no adnexal tenderness or masses Buttock: she has several clusters of what appear to be HSV vesicles on the right buttok.     Results for orders placed in visit on 05/15/13  POCT CBC      Result Value Range   WBC 4.9  4.6 - 10.2 K/uL   Lymph, poc 1.3  0.6 - 3.4   POC LYMPH PERCENT 33.6  10 - 50 %L   MID (cbc) 0.4  0 - 0.9   POC MID % 7.7  0 - 12 %M   POC Granulocyte 2.9  2 - 6.9   Granulocyte percent 59.0  37 - 80 %G   RBC 4.41  4.04 - 5.48 M/uL   Hemoglobin 12.7  12.2 - 16.2 g/dL   HCT, POC 78.2  95.6 - 47.9 %   MCV 94.1  80 - 97 fL   MCH, POC 28.8  27 - 31.2 pg   MCHC 30.6 (*) 31.8 - 35.4 g/dL   RDW, POC 21.3     Platelet Count, POC 263  142 - 424 K/uL   MPV 9.1  0 - 99.8 fL  POCT URINALYSIS DIPSTICK      Result Value Range   Color, UA yellow     Clarity, UA clear     Glucose, UA neg     Bilirubin, UA neg     Ketones, UA neg     Spec Grav, UA 1.015     Blood, UA neg     pH, UA 7.0     Protein, UA neg     Urobilinogen, UA 0.2     Nitrite, UA neg     Leukocytes, UA Negative    POCT UA - MICROSCOPIC ONLY      Result Value Range   WBC, Ur, HPF, POC neg     RBC, urine, microscopic neg     Bacteria, U Microscopic neg     Mucus, UA neg     Epithelial cells, urine per micros 0-1     Crystals, Ur, HPF, POC neg     Casts, Ur, LPF, POC neg     Yeast, UA neg     Amorphous, UA pos      Assessment and  Plan: Physical exam - Plan: POCT urinalysis dipstick, POCT UA - Microscopic Only, CANCELED: Pap IG and HPV (high risk) DNA detection  History of abnormal Pap smear - Plan: Pap IG and HPV (high risk) DNA detection, CANCELED: Pap IG and HPV (high risk) DNA detection  HSV (herpes simplex virus) infection - Plan: Herpes simplex virus culture, valACYclovir (VALTREX) 500 MG tablet  Leukopenia - Plan: POCT CBC, Pathologist smear review  HTN (hypertension) - Plan: losartan (COZAAR) 100 MG tablet, NIFEdipine (PROCARDIA XL/ADALAT-CC) 60 MG 24 hr tablet  Elbow pain, right - Plan: diclofenac sodium (VOLTAREN) 1 % GEL  Went through and helped her organize her medications, threw out  expired meds and consolidated duplicate bottles.  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  HSV culture pending, treat apparent recurrent outbreak with valtrex.   History of leukopenia: she has had mild leukopenia on several past CBC.  Peripheral smear today, but her wbc count is normal today HTN: she has stopped her procardia.  Will start back on this medication.  It seems she stopped it due to confusion about her medications.  Will need to follow- up soon to check her response- plan this along with lab results.   Encouraged her to regularly go through her medications and be sure that they are organized and in date, encouraged a pill box for her daily medications.   See patient instructions for more details.      Signed Abbe Amsterdam, MD  6/21- called to go over lab results but no answer.  Will wait final peripheral smear results

## 2013-05-15 NOTE — Patient Instructions (Addendum)
We are going to add back your nifedipine (BP medication).  Continue to take the losartan as well.  Use the valtrex as needed when you get an outbreak on your behind  Let's do a tetanus shot at your next visit- you need a Tdap but I do not want to do this while you have an active outbreak  You can have the zostavax (shingles shot) at your convenience.    You saw Dr. Christella Hartigan at [facility] in 2008 for your colonoscopy.  According to his notes you are due in 10 years from your last test

## 2013-05-17 ENCOUNTER — Telehealth: Payer: Self-pay | Admitting: Radiology

## 2013-05-17 LAB — PAP IG AND HPV HIGH-RISK: HPV DNA High Risk: NOT DETECTED

## 2013-05-17 NOTE — Telephone Encounter (Signed)
Patients insurance company requires prior auth for Voltaren Gel, in order to get this covered she must have tried and failed 2 NSAIDs or be unable to take NSAIDs due to anticoagulation, she can not use in combination with oral NSAID. Please advise, does not look like she meets criteria. [name]

## 2013-05-21 ENCOUNTER — Encounter: Payer: Self-pay | Admitting: Family Medicine

## 2013-05-21 ENCOUNTER — Telehealth: Payer: Self-pay | Admitting: Family Medicine

## 2013-05-21 MED ORDER — METRONIDAZOLE 500 MG PO TABS: 500.0000 mg | ORAL_TABLET | Freq: Two times a day (BID) | ORAL | Status: DC

## 2013-05-21 NOTE — Telephone Encounter (Signed)
Called to discuss her labs.  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   Normal peripheral smear, CBC now normal.   She has not yet started checking her BP since going back on her procardia, but she will start and give me an update by phone in a couple of weeks.  Copy of labs to pt

## 2013-05-21 NOTE — Telephone Encounter (Signed)
Also, forgot to tell her that she has BV.  Called in rx for flagyl for her, [facility] with her cell phone

## 2013-05-21 NOTE — Telephone Encounter (Signed)
Called her back- she actually gets this through her [facility] insurance, and has not had trouble getting it filled

## 2013-05-21 NOTE — Addendum Note (Signed)
Addended by: Abbe Amsterdam C on: 05/21/2013 04:51 PM   Modules accepted: Orders, Medications

## 2013-07-02 ENCOUNTER — Encounter: Payer: Self-pay | Admitting: Family Medicine

## 2013-10-08 ENCOUNTER — Ambulatory Visit: Payer: BC Managed Care – PPO

## 2013-10-08 ENCOUNTER — Ambulatory Visit (INDEPENDENT_AMBULATORY_CARE_PROVIDER_SITE_OTHER): Payer: BC Managed Care – PPO | Admitting: Family Medicine

## 2013-10-08 VITALS — BP 132/84 | HR 74 | Temp 98.4°F | Resp 18 | Ht 63.25 in | Wt 217.0 lb

## 2013-10-08 DIAGNOSIS — R079 Chest pain, unspecified: Secondary | ICD-10-CM

## 2013-10-08 DIAGNOSIS — R05 Cough: Secondary | ICD-10-CM

## 2013-10-08 DIAGNOSIS — J209 Acute bronchitis, unspecified: Secondary | ICD-10-CM

## 2013-10-08 LAB — POCT CBC
Granulocyte percent: 55 %G (ref 37–80)
HCT, POC: 38.3 % (ref 37.7–47.9)
Hemoglobin: 11.9 g/dL — AB (ref 12.2–16.2)
MCHC: 31.1 g/dL — AB (ref 31.8–35.4)
MPV: 9.1 fL (ref 0–99.8)
POC Granulocyte: 2.3 (ref 2–6.9)
POC MID %: 8.4 %M (ref 0–12)
RBC: 4.11 M/uL (ref 4.04–5.48)

## 2013-10-08 LAB — TSH: TSH: 0.431 u[IU]/mL (ref 0.350–4.500)

## 2013-10-08 MED ORDER — HYDROCODONE-HOMATROPINE 5-1.5 MG/5ML PO SYRP: 5.0000 mL | ORAL_SOLUTION | Freq: Three times a day (TID) | ORAL | Status: DC | PRN

## 2013-10-08 MED ORDER — NITROGLYCERIN 0.4 MG SL SUBL: 0.4000 mg | SUBLINGUAL_TABLET | SUBLINGUAL | Status: DC | PRN

## 2013-10-08 MED ORDER — IPRATROPIUM BROMIDE 0.03 % NA SOLN: 2.0000 | Freq: Four times a day (QID) | NASAL | Status: DC

## 2013-10-08 NOTE — Progress Notes (Addendum)
This chart was scribed for Shelby Sorenson, MD, by [name], ED Scribe. This patient's care was started at 8:39 AM.   Subjective:    Patient ID: [name], female    DOB: July 30, 1951, 62 y.o.   MRN: [id number]  Chief Complaint  Patient presents with   Cough    congestion   Fever    3 days last week   Medication Refill    nitroglycerin   HPI  HPI Comments:  [name] is a 62 y.o. female who presents to the [facility] complaining of a non-productive cough which has persisted for three weeks. She reports increased coughing with talking and she also reports the cough has interrupted her sleep. Additionally, she states that she often becomes hot with an episode of coughing.  Associated with the cough, she has experienced post-nasal drip, chest congestion, bilateral otalgia, and sinus pressure.   She also reports experiencing a fever - max temp was 99 F. She has used Mucinex and Theraflu without resolution. The pt denies any SOB. She also denies any productive rhinorrhea.   She states that she also experienced intermittent left-sided chest pain which she attributed to the cough and congestion. She has a sig family h/o heart disease, and she was alarmed by the pain. The pt cannot identify any triggers of the chest pain. She states her last cardiology eval was 18 months ago, was negative, and that she has never used the nitroglycerine prescribed to her. Her nitroglycerine prescription has expired for sev yrs and so she would like it renewed. Though she has no h/o personal CAD she was told to see cardiology yrly due to her family hx and multiple comorbidities.   The pt takes a thyroid replacement. She reports that in the past year, she has experienced decreased energy and increased fatigue. The pt states she has been on the same level of thyroid medication for years so wonders if her dose needs to be changed.   She is a non-smoker.   Current Outpatient Prescriptions on File  Prior to Visit  Medication Sig Dispense Refill   ALPRAZolam (XANAX) 0.25 MG tablet Take 0.25 mg by mouth 2 (two) times daily as needed for sleep.       aspirin 81 MG tablet Take 81 mg by mouth daily.       butalbital-acetaminophen-caffeine (FIORICET WITH CODEINE) 50-325-40-30 MG per capsule Take 1 capsule by mouth every 4 (four) hours as needed.       desvenlafaxine (PRISTIQ) 100 MG 24 hr tablet Take 100 mg by mouth daily.       Diclofenac Potassium (CAMBIA PO) Take by mouth.       diclofenac sodium (VOLTAREN) 1 % GEL Apply 2 g topically 4 (four) times daily.  100 g  3   Eszopiclone (ESZOPICLONE) 3 MG TABS Take 3 mg by mouth at bedtime. Take immediately before bedtime       frovatriptan (FROVA) 2.5 MG tablet Take 2.5 mg by mouth as needed for migraine. If recurs, may repeat after 2 hours. Max of 3 tabs in 24 hours.       gabapentin (NEURONTIN) 300 MG capsule Take 300 mg by mouth 3 (three) times daily.       levothyroxine (SYNTHROID, LEVOTHROID) 100 MCG tablet Take 1 tablet (100 mcg total) by mouth daily.  90 tablet  3   losartan (COZAAR) 100 MG tablet TAKE 1 TABLET BY MOUTH DAILY  90 tablet  3   NIFEdipine (PROCARDIA XL/ADALAT-CC) 60  MG 24 hr tablet Take 1 tablet (60 mg total) by mouth daily.  90 tablet  3   nitroGLYCERIN (NITROSTAT) 0.4 MG SL tablet Place 0.4 mg under the tongue every 5 (five) minutes as needed.       oxyCODONE-acetaminophen (ROXICET) 5-325 MG per tablet Take 1 tablet by mouth every 8 (eight) hours as needed for pain.  20 tablet  0   tiZANidine (ZANAFLEX) 4 MG capsule Take 4 mg by mouth 3 (three) times daily.       valACYclovir (VALTREX) 500 MG tablet Take 1 tablet (500 mg total) by mouth 2 (two) times daily. Take for 3 days as needed for outbreaks.  30 tablet  0   HYDROcodone-acetaminophen (NORCO) 7.5-325 MG per tablet Take 1 tablet by mouth 2 (two) times daily as needed for pain.       omeprazole (PRILOSEC) 40 MG capsule Take 40 mg by mouth daily.       No  current facility-administered medications on file prior to visit.   Past Medical History  Diagnosis Date   Headaches due to old head injury    Hypertension    Thyroid disease    Reflux    Arthritis    GERD (gastroesophageal reflux disease)    Depression    Allergy    Allergies  Allergen Reactions   Botox [Botulinum Toxin Type A]    Toprol Xl [Metoprolol Succinate]     Review of Systems  Constitutional: Positive for fever, diaphoresis and fatigue. Negative for chills.  HENT: Positive for congestion (Mild), ear pain, postnasal drip, sinus pressure and sore throat. Negative for ear discharge, mouth sores, nosebleeds, rhinorrhea, sneezing, trouble swallowing and voice change.   Eyes: Positive for photophobia. Negative for pain.  Respiratory: Positive for cough and chest tightness. Negative for shortness of breath.   Cardiovascular: Positive for chest pain.  Gastrointestinal: Negative for nausea, vomiting and abdominal pain.  Genitourinary: Positive for frequency. Negative for dysuria.  Musculoskeletal: Positive for arthralgias and back pain. Negative for gait problem, joint swelling, neck pain and neck stiffness.  Neurological: Positive for weakness, light-headedness and headaches. Negative for dizziness and syncope.  Hematological: Negative for adenopathy.  Psychiatric/Behavioral: Positive for sleep disturbance.     Triage Vitals: BP 132/84   Pulse 74   Temp(Src) 98.4 F (36.9 C) (Oral)   Resp 18   Ht 5' 3.25" (1.607 m)   Wt 217 lb (98.431 kg)   BMI 38.12 kg/m2   SpO2 99%    Objective:   Physical Exam  Nursing note and vitals reviewed. Constitutional: She is oriented to person, place, and time. She appears well-developed and well-nourished. No distress.  HENT:  Head: Normocephalic and atraumatic.  Right Ear: External ear and ear canal normal. Tympanic membrane is not injected, not erythematous and not retracted. A middle ear effusion is present.  Left Ear: External  ear and ear canal normal. Tympanic membrane is not injected, not erythematous and not retracted. A middle ear effusion is present.  Nose: Nose normal. No mucosal edema or rhinorrhea.  Mouth/Throat: Uvula is midline and oropharynx is clear and moist. No oropharyngeal exudate, posterior oropharyngeal edema or posterior oropharyngeal erythema.  Eyes: EOM are normal.  Neck: Neck supple. No tracheal deviation present.  Cardiovascular: Normal rate, regular rhythm and normal heart sounds.   No murmur heard. Pulmonary/Chest: Effort normal and breath sounds normal. No respiratory distress.  Musculoskeletal: Normal range of motion.  Lymphadenopathy:       Head (right side): No  submental, no submandibular, no tonsillar, no preauricular and no posterior auricular adenopathy present.       Head (left side): No submental, no submandibular, no tonsillar, no preauricular and no posterior auricular adenopathy present.    She has no cervical adenopathy.       Right: No supraclavicular adenopathy present.       Left: No supraclavicular adenopathy present.  Neurological: She is alert and oriented to person, place, and time.  Skin: Skin is warm and dry.  Psychiatric: She has a normal mood and affect. Her behavior is normal.   [facility] reading (PRIMARY) by  Dr. Clelia Croft. Chest x-ray: Question cardiomegaly vs. Poor inspiration. Increased perihilar markings.  EKG: NSR, no ischemic changes  Results for orders placed in visit on 10/08/13  POCT CBC      Result Value Range   WBC 4.2 (*) 4.6 - 10.2 K/uL   Lymph, poc 1.5  0.6 - 3.4   POC LYMPH PERCENT 36.6  10 - 50 %L   MID (cbc) 0.4  0 - 0.9   POC MID % 8.4  0 - 12 %M   POC Granulocyte 2.3  2 - 6.9   Granulocyte percent 55.0  37 - 80 %G   RBC 4.11  4.04 - 5.48 M/uL   Hemoglobin 11.9 (*) 12.2 - 16.2 g/dL   HCT, POC 91.4  78.2 - 47.9 %   MCV 93.1  80 - 97 fL   MCH, POC 29.0  27 - 31.2 pg   MCHC 31.1 (*) 31.8 - 35.4 g/dL   RDW, POC 95.6     Platelet Count, POC 246   142 - 424 K/uL   MPV 9.1  0 - 99.8 fL       Assessment & Plan:  Chest pain - Plan: Ambulatory referral to Cardiology, EKG 12-Lead, DG Chest 2 View, POCT CBC, TSH - pt has a sig fam hx of heart disease so has been seeing cardiology yrly and given prophylactic nitro which was refilled today but no actual diagnosis of CAD. Her prior cardiologist moved away and last eval was 2 yrs ago so will refer back to cardiology  Cough - Plan: EKG 12-Lead, DG Chest 2 View, POCT CBC, TSH  Acute bronchitis - If pt continues to worsen, I will be happy to call her in a zpack in the future - I just don't think she will get any significant benefit from antibiotic treatment at this time due to nml cbc and dxr - advised symptomatic care but if not starting to improve in sev d, will call in zpack.  9:22 AM Informed pt that she has bronchitis. Explained to pt that bronchitis is viral and usually lasts 3-4 weeks. Informed pt that she would be prescribed a cough syrup with pain medication as well as a prescription for nasal spray. Reviewed the pt's labs and imaging with her. Encouraged pt to continue to drink hot-tea with honey and to continue sleeping upright. Also encouraged the pt to use a humidifier at night.  Meds ordered this encounter  Medications   nitroGLYCERIN (NITROSTAT) 0.4 MG SL tablet    Sig: Place 1 tablet (0.4 mg total) under the tongue every 5 (five) minutes as needed.    Dispense:  30 tablet    Refill:  0   HYDROcodone-homatropine (HYCODAN) 5-1.5 MG/5ML syrup    Sig: Take 5 mLs by mouth every 8 (eight) hours as needed for cough.    Dispense:  120 mL  Refill:  0   ipratropium (ATROVENT) 0.03 % nasal spray    Sig: Place 2 sprays into the nose 4 (four) times daily.    Dispense:  30 mL    Refill:  1    I personally performed the services described in this documentation, which was scribed in my presence. The recorded information has been reviewed and considered, and addended by me as needed.  Shelby Sorenson, MD MPH

## 2013-10-08 NOTE — Patient Instructions (Signed)
Acute Bronchitis Bronchitis is inflammation of the airways that extend from the windpipe into the lungs (bronchi). The inflammation often causes mucus to develop. This leads to a cough, which is the most common symptom of bronchitis.  In acute bronchitis, the condition usually develops suddenly and goes away over time, usually in a couple weeks. Smoking, allergies, and asthma can make bronchitis worse. Repeated episodes of bronchitis may cause further lung problems.  CAUSES Acute bronchitis is most often caused by the same virus that causes a cold. The virus can spread from person to person (contagious).  SIGNS AND SYMPTOMS   Cough.   Fever.   Coughing up mucus.   Body aches.   Chest congestion.   Chills.   Shortness of breath.   Sore throat.  DIAGNOSIS  Acute bronchitis is usually diagnosed through a physical exam. Tests, such as chest X-rays, are sometimes done to rule out other conditions.  TREATMENT  Acute bronchitis usually goes away in a couple weeks. Often times, no medical treatment is necessary. Medicines are sometimes given for relief of fever or cough. Antibiotics are usually not needed but may be prescribed in certain situations. In some cases, an inhaler may be recommended to help reduce shortness of breath and control the cough. A cool mist vaporizer may also be used to help thin bronchial secretions and make it easier to clear the chest.  HOME CARE INSTRUCTIONS  Get plenty of rest.   Drink enough fluids to keep your urine clear or pale yellow (unless you have a medical condition that requires fluid restriction). Increasing fluids may help thin your secretions and will prevent dehydration.   Only take over-the-counter or prescription medicines as directed by your health care provider.   Avoid smoking and secondhand smoke. Exposure to cigarette smoke or irritating chemicals will make bronchitis worse. If you are a smoker, consider using nicotine gum or skin  patches to help control withdrawal symptoms. Quitting smoking will help your lungs heal faster.   Reduce the chances of another bout of acute bronchitis by washing your hands frequently, avoiding people with cold symptoms, and trying not to touch your hands to your mouth, nose, or eyes.   Follow up with your health care provider as directed.  SEEK MEDICAL CARE IF: Your symptoms do not improve after 1 week of treatment.  SEEK IMMEDIATE MEDICAL CARE IF:  You develop an increased fever or chills.   You have chest pain.   You have severe shortness of breath.  You have bloody sputum.   You develop dehydration.  You develop fainting.  You develop repeated vomiting.  You develop a severe headache. MAKE SURE YOU:   Understand these instructions.  Will watch your condition.  Will get help right away if you are not doing well or get worse. Document Released: 12/22/2004 Document Revised: 07/17/2013 Document Reviewed: 05/07/2013 [employer] Patient Information 2014 [employer].  

## 2013-10-09 ENCOUNTER — Encounter: Payer: Self-pay | Admitting: Family Medicine

## 2013-10-09 NOTE — Progress Notes (Signed)
This chart was scribed for [name], MD, by [name], ED Scribe. This patient's care was started at 8:39 AM.   Subjective:    Patient ID: [name], female    DOB: 1951/02/13, 62 y.o.   MRN: [id number]  Chief Complaint  Patient presents with  . Cough    congestion  . Fever    3 days last week  . Medication Refill    nitroglycerin   Cough Associated symptoms include chest pain, ear pain, a fever, headaches, postnasal drip and a sore throat. Pertinent negatives include no chills, rhinorrhea or shortness of breath.  Fever  Associated symptoms include chest pain, congestion (Mild), coughing, ear pain, headaches and a sore throat. Pertinent negatives include no abdominal pain, nausea or vomiting.    HPI Comments:  [name] is a 62 y.o. female who presents to the [facility] complaining of a non-productive cough which has persisted for three weeks. She reports increased coughing with talking and she also reports the cough has interrupted her sleep. Additionally, she states that she often becomes hot with an episode of coughing.  Associated with the cough, she has experienced post-nasal drip, chest congestion, bilateral otalgia, and sinus pressure.   She also reports experiencing a fever - max temp was 99 F. She has used Mucinex and Theraflu without resolution. The pt denies any SOB. She also denies any productive rhinorrhea.   She states that she also experienced intermittent left-sided chest pain which she attributed to the cough and congestion. She has a sig family h/o heart disease, and she was alarmed by the pain. The pt cannot identify any triggers of the chest pain. She states her last cardiology eval was 18 months ago, was negative, and that she has never used the nitroglycerine prescribed to her. Her nitroglycerine prescription has expired for sev yrs and so she would like it renewed. Though she has no h/o personal CAD she was told to see cardiology yrly due to  her family hx and multiple comorbidities.   The pt takes a thyroid replacement. She reports that in the past year, she has experienced decreased energy and increased fatigue. The pt states she has been on the same level of thyroid medication for years so wonders if her dose needs to be changed.   She is a non-smoker.   Current Outpatient Prescriptions on File Prior to Visit  Medication Sig Dispense Refill  . ALPRAZolam (XANAX) 0.25 MG tablet Take 0.25 mg by mouth 2 (two) times daily as needed for sleep.      Marland Kitchen aspirin 81 MG tablet Take 81 mg by mouth daily.      . butalbital-acetaminophen-caffeine (FIORICET WITH CODEINE) 50-325-40-30 MG per capsule Take 1 capsule by mouth every 4 (four) hours as needed.      . desvenlafaxine (PRISTIQ) 100 MG 24 hr tablet Take 100 mg by mouth daily.      . Diclofenac Potassium (CAMBIA PO) Take by mouth.      . diclofenac sodium (VOLTAREN) 1 % GEL Apply 2 g topically 4 (four) times daily.  100 g  3  . Eszopiclone (ESZOPICLONE) 3 MG TABS Take 3 mg by mouth at bedtime. Take immediately before bedtime      . frovatriptan (FROVA) 2.5 MG tablet Take 2.5 mg by mouth as needed for migraine. If recurs, may repeat after 2 hours. Max of 3 tabs in 24 hours.      . gabapentin (NEURONTIN) 300 MG capsule Take 300 mg by  mouth 3 (three) times daily.      Marland Kitchen levothyroxine (SYNTHROID, LEVOTHROID) 100 MCG tablet Take 1 tablet (100 mcg total) by mouth daily.  90 tablet  3  . losartan (COZAAR) 100 MG tablet TAKE 1 TABLET BY MOUTH DAILY  90 tablet  3  . NIFEdipine (PROCARDIA XL/ADALAT-CC) 60 MG 24 hr tablet Take 1 tablet (60 mg total) by mouth daily.  90 tablet  3  . oxyCODONE-acetaminophen (ROXICET) 5-325 MG per tablet Take 1 tablet by mouth every 8 (eight) hours as needed for pain.  20 tablet  0  . tiZANidine (ZANAFLEX) 4 MG capsule Take 4 mg by mouth 3 (three) times daily.      . valACYclovir (VALTREX) 500 MG tablet Take 1 tablet (500 mg total) by mouth 2 (two) times daily. Take  for 3 days as needed for outbreaks.  30 tablet  0  . HYDROcodone-acetaminophen (NORCO) 7.5-325 MG per tablet Take 1 tablet by mouth 2 (two) times daily as needed for pain.       No current facility-administered medications on file prior to visit.   Past Medical History  Diagnosis Date  . Headaches due to old head injury   . Hypertension   . Thyroid disease   . Reflux   . Arthritis   . GERD (gastroesophageal reflux disease)   . Depression   . Allergy    Allergies  Allergen Reactions  . Botox [Botulinum Toxin Type A]   . Toprol Xl [Metoprolol Succinate]     Review of Systems  Constitutional: Positive for fever, diaphoresis and fatigue. Negative for chills.  HENT: Positive for congestion (Mild), ear pain, postnasal drip, sinus pressure and sore throat. Negative for ear discharge, mouth sores, nosebleeds, rhinorrhea, sneezing, trouble swallowing and voice change.   Eyes: Positive for photophobia. Negative for pain.  Respiratory: Positive for cough and chest tightness. Negative for shortness of breath.   Cardiovascular: Positive for chest pain.  Gastrointestinal: Negative for nausea, vomiting and abdominal pain.  Genitourinary: Positive for frequency. Negative for dysuria.  Musculoskeletal: Positive for arthralgias and back pain. Negative for gait problem, joint swelling, neck pain and neck stiffness.  Neurological: Positive for weakness, light-headedness and headaches. Negative for dizziness and syncope.  Hematological: Negative for adenopathy.  Psychiatric/Behavioral: Positive for sleep disturbance.     Triage Vitals: BP 132/84  Pulse 74  Temp(Src) 98.4 F (36.9 C) (Oral)  Resp 18  Ht 5' 3.25" (1.607 m)  Wt 217 lb (98.431 kg)  BMI 38.12 kg/m2  SpO2 99%    Objective:   Physical Exam  Nursing note and vitals reviewed. Constitutional: She is oriented to person, place, and time. She appears well-developed and well-nourished. No distress.  HENT:  Head: Normocephalic and  atraumatic.  Right Ear: External ear and ear canal normal. Tympanic membrane is not injected, not erythematous and not retracted. A middle ear effusion is present.  Left Ear: External ear and ear canal normal. Tympanic membrane is not injected, not erythematous and not retracted. A middle ear effusion is present.  Nose: Nose normal. No mucosal edema or rhinorrhea.  Mouth/Throat: Uvula is midline and oropharynx is clear and moist. No oropharyngeal exudate, posterior oropharyngeal edema or posterior oropharyngeal erythema.  Eyes: EOM are normal.  Neck: Neck supple. No tracheal deviation present.  Cardiovascular: Normal rate, regular rhythm and normal heart sounds.   No murmur heard. Pulmonary/Chest: Effort normal and breath sounds normal. No respiratory distress.  Musculoskeletal: Normal range of motion.  Lymphadenopathy:  Head (right side): No submental, no submandibular, no tonsillar, no preauricular and no posterior auricular adenopathy present.       Head (left side): No submental, no submandibular, no tonsillar, no preauricular and no posterior auricular adenopathy present.    She has no cervical adenopathy.       Right: No supraclavicular adenopathy present.       Left: No supraclavicular adenopathy present.  Neurological: She is alert and oriented to person, place, and time.  Skin: Skin is warm and dry.  Psychiatric: She has a normal mood and affect. Her behavior is normal.  EXAM: CHEST 2 VIEW  COMPARISON: None.  FINDINGS: Mild cardiomegaly is noted. No pleural effusion or pneumothorax is seen. Right lung is clear. Linear density is seen in left midlung most consistent with scarring. The visualized skeletal structures are unremarkable.  IMPRESSION: No active cardiopulmonary disease.   [facility] reading (PRIMARY) by  Dr. Clelia Croft. Chest x-ray: Question cardiomegaly vs. poor inspiration. Increased perihilar markings on left.  EKG: NSR, no ischemic changes  Results for orders  placed in visit on 10/08/13  TSH      Result Value Range   TSH 0.431  0.350 - 4.500 uIU/mL  POCT CBC      Result Value Range   WBC 4.2 (*) 4.6 - 10.2 K/uL   Lymph, poc 1.5  0.6 - 3.4   POC LYMPH PERCENT 36.6  10 - 50 %L   MID (cbc) 0.4  0 - 0.9   POC MID % 8.4  0 - 12 %M   POC Granulocyte 2.3  2 - 6.9   Granulocyte percent 55.0  37 - 80 %G   RBC 4.11  4.04 - 5.48 M/uL   Hemoglobin 11.9 (*) 12.2 - 16.2 g/dL   HCT, POC 65.7  84.6 - 47.9 %   MCV 93.1  80 - 97 fL   MCH, POC 29.0  27 - 31.2 pg   MCHC 31.1 (*) 31.8 - 35.4 g/dL   RDW, POC 96.2     Platelet Count, POC 246  142 - 424 K/uL   MPV 9.1  0 - 99.8 fL       Assessment & Plan:  Chest pain - Plan: Ambulatory referral to Cardiology, EKG 12-Lead, DG Chest 2 View, POCT CBC, TSH - pt has a sig fam hx of heart disease so has been seeing cardiology yrly and given prophylactic nitro which was refilled today but no actual diagnosis of CAD. Her prior cardiologist moved away and last eval was 2 yrs ago so will refer back to cardiology  Cough - Plan: EKG 12-Lead, DG Chest 2 View, POCT CBC, TSH  Acute bronchitis - If pt continues to worsen, I will be happy to call her in a zpack in the future - I just don't think she will get any significant benefit from antibiotic treatment at this time due to nml cbc and dxr - advised symptomatic care but if not starting to improve in sev d, will call in zpack.  9:22 AM Informed pt that she has bronchitis. Explained to pt that bronchitis is viral and usually lasts 3-4 weeks. Informed pt that she would be prescribed a cough syrup with pain medication as well as a prescription for nasal spray. Reviewed the pt's labs and imaging with her. Encouraged pt to continue to drink hot-tea with honey and to continue sleeping upright. Also encouraged the pt to use a humidifier at night.  Meds ordered this encounter  Medications  .  nitroGLYCERIN (NITROSTAT) 0.4 MG SL tablet    Sig: Place 1 tablet (0.4 mg total) under the  tongue every 5 (five) minutes as needed.    Dispense:  30 tablet    Refill:  0  . HYDROcodone-homatropine (HYCODAN) 5-1.5 MG/5ML syrup    Sig: Take 5 mLs by mouth every 8 (eight) hours as needed for cough.    Dispense:  120 mL    Refill:  0  . ipratropium (ATROVENT) 0.03 % nasal spray    Sig: Place 2 sprays into the nose 4 (four) times daily.    Dispense:  30 mL    Refill:  1    I personally performed the services described in this documentation, which was scribed in my presence. The recorded information has been reviewed and considered, and addended by me as needed.  [name], MD MPH

## 2013-12-09 ENCOUNTER — Encounter: Payer: Self-pay | Admitting: *Deleted

## 2013-12-11 ENCOUNTER — Encounter: Payer: Self-pay | Admitting: Internal Medicine

## 2013-12-13 ENCOUNTER — Ambulatory Visit (INDEPENDENT_AMBULATORY_CARE_PROVIDER_SITE_OTHER): Payer: BC Managed Care – PPO | Admitting: Internal Medicine

## 2013-12-13 ENCOUNTER — Encounter: Payer: Self-pay | Admitting: Internal Medicine

## 2013-12-13 VITALS — BP 162/96 | HR 71 | Ht 63.5 in | Wt 217.5 lb

## 2013-12-13 DIAGNOSIS — R079 Chest pain, unspecified: Secondary | ICD-10-CM

## 2013-12-13 DIAGNOSIS — Z79899 Other long term (current) drug therapy: Secondary | ICD-10-CM

## 2013-12-13 DIAGNOSIS — R9431 Abnormal electrocardiogram [ECG] [EKG]: Secondary | ICD-10-CM

## 2013-12-13 DIAGNOSIS — Z8249 Family history of ischemic heart disease and other diseases of the circulatory system: Secondary | ICD-10-CM

## 2013-12-13 DIAGNOSIS — E785 Hyperlipidemia, unspecified: Secondary | ICD-10-CM

## 2013-12-13 DIAGNOSIS — Z23 Encounter for immunization: Secondary | ICD-10-CM

## 2013-12-13 DIAGNOSIS — E669 Obesity, unspecified: Secondary | ICD-10-CM | POA: Insufficient documentation

## 2013-12-13 DIAGNOSIS — I10 Essential (primary) hypertension: Secondary | ICD-10-CM

## 2013-12-13 NOTE — Patient Instructions (Addendum)
Your physician has requested that you have en exercise stress myoview. For further information please visit [email or web address]. Please follow instruction sheet, as given.  Your physician recommends that you return for lab work in: a few days to a week. You will need to be fasting - nothing to eat/drink after midnight.  Your physician recommends that you schedule a follow-up appointment in: 2-3 weeks, after your test and blood work.   You received the flu vaccine today 12/13/13

## 2013-12-13 NOTE — Progress Notes (Signed)
OFFICE NOTE  Chief Complaint:  Chest pain  Primary Care Physician: Lamar Blinks, MD  HPI:  [name] is a pleasant 63 year old female with a strong family history of heart disease. Her mother died of a sudden heart attack at age 49, her both grandparents had heart disease and a grandmother had a stroke at age 55. She was seen remotely by cardiologist more than 3 years ago and had stress testing at that time which was negative. Back in November she was complaining of chest pain which was intermittent. It was somewhat worse at that time however she had an active bronchitis and was treated. Her symptoms improved somewhat however she still had some chest pain since that time. Some of it is associated with exertion and relieved by rest and other times it comes fairly randomly. It does feel like a squeezing sensation in the upper chest it does not radiate to the neck, arm or back. She describes the discomfort as a 5 or 6/10 in severity.  She occasionally takes nitroglycerin for this however her old prescription was only recently renewed.  She is desiring to start an exercise program and would like cardiovascular evaluation and risk stratification prior to that.  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  PMHx:  Past Medical History  Diagnosis Date  . Headaches due to old head injury   . Hypertension   . Thyroid disease   . Reflux   . Arthritis   . GERD (gastroesophageal reflux disease)   . Depression   . Allergy   . Dyslipidemia   . Obesity     Past Surgical History  Procedure Laterality Date  . Facet joint injection  2011 and 2012  . Breast surgery  2003    reduction  . Thyroidectomy  1989  . Tubal ligation    . Abdominal hysterectomy    . Myomectomy    . Cesarean section    .  Transthoracic echocardiogram  03/2009    EF=>55%, borderline conc LVH; mild mitral annular calcif, trace MR; trace TR; AV mildly sclerotic, mild AV regurg; mild pulm valve regurg   . Nm myocar perf wall motion  03/2009    dipyridamole; normal pattern of perfusion in all regions, post-stress EF 65%, normal study     FAMHx:  Family History  Problem Relation Age of Onset  . Heart disease Mother     MI, HTN  . Heart disease Maternal Grandmother     MI, CVA  . Heart disease Maternal Grandfather     MI  . Stroke Maternal Grandfather   . Heart disease Paternal Grandmother   . Stroke Paternal Grandmother   . Heart disease Paternal Grandfather     MI  . Glaucoma Father   . Diabetes Father   . Depression Daughter   . ADD / ADHD Son   . Heart attack Paternal Uncle     HTN    SOCHx:   reports that she has never smoked. She has never used smokeless tobacco. She reports that she drinks about 0.5 ounces of alcohol per week. She reports that she does not use illicit drugs.  ALLERGIES:  Allergies  Allergen Reactions  . Botox [Botulinum Toxin Type A]   . Toprol Xl [Metoprolol Succinate] Hives    ROS: A comprehensive review  of systems was negative except for: Constitutional: positive for weight gain Cardiovascular: positive for chest pressure/discomfort Neurological: positive for headaches and memory problems  HOME MEDS: Current Outpatient Prescriptions  Medication Sig Dispense Refill  . ALPRAZolam (XANAX) 0.25 MG tablet Take 0.25 mg by mouth 2 (two) times daily as needed for sleep.      Marland Kitchen aspirin 81 MG tablet Take 81 mg by mouth daily.      . butalbital-acetaminophen-caffeine (FIORICET WITH CODEINE) 50-325-40-30 MG per capsule Take 1 capsule by mouth every 4 (four) hours as needed.      . desvenlafaxine (PRISTIQ) 100 MG 24 hr tablet Take 100 mg by mouth daily.      . Diclofenac Potassium (CAMBIA PO) Take 50 mg by mouth as needed.       . diclofenac sodium (VOLTAREN) 1 % GEL Apply 2 g  topically 4 (four) times daily.  100 g  3  . Eszopiclone (ESZOPICLONE) 3 MG TABS Take 3 mg by mouth at bedtime. Take immediately before bedtime      . frovatriptan (FROVA) 2.5 MG tablet Take 2.5 mg by mouth as needed for migraine. If recurs, may repeat after 2 hours. Max of 3 tabs in 24 hours.      . gabapentin (NEURONTIN) 300 MG capsule Take 300 mg by mouth 3 (three) times daily.      Marland Kitchen HYDROcodone-acetaminophen (NORCO) 7.5-325 MG per tablet Take 1 tablet by mouth 2 (two) times daily as needed for pain.      Marland Kitchen ipratropium (ATROVENT) 0.03 % nasal spray Place 2 sprays into the nose 4 (four) times daily.  30 mL  1  . levothyroxine (SYNTHROID, LEVOTHROID) 100 MCG tablet Take 1 tablet (100 mcg total) by mouth daily.  90 tablet  3  . losartan (COZAAR) 100 MG tablet TAKE 1 TABLET BY MOUTH DAILY  90 tablet  3  . NIFEdipine (PROCARDIA XL/ADALAT-CC) 60 MG 24 hr tablet Take 1 tablet (60 mg total) by mouth daily.  90 tablet  3  . nitroGLYCERIN (NITROSTAT) 0.4 MG SL tablet Place 1 tablet (0.4 mg total) under the tongue every 5 (five) minutes as needed.  30 tablet  0  . omeprazole (PRILOSEC) 40 MG capsule Take 40 mg by mouth daily.      Marland Kitchen oxyCODONE-acetaminophen (ROXICET) 5-325 MG per tablet Take 1 tablet by mouth every 8 (eight) hours as needed for pain.  20 tablet  0  . tiZANidine (ZANAFLEX) 4 MG capsule Take 4 mg by mouth 3 (three) times daily.      . valACYclovir (VALTREX) 500 MG tablet Take 1 tablet (500 mg total) by mouth 2 (two) times daily. Take for 3 days as needed for outbreaks.  30 tablet  0   No current facility-administered medications for this visit.    LABS/IMAGING: No results found for this or any previous visit (from the past 48 hour(s)). No results found.  VITALS: BP 162/96  Pulse 71  Ht 5' 3.5" (1.613 m)  Wt 217 lb 8 oz (98.657 kg)  BMI 37.92 kg/m2  EXAM: General appearance: alert and no distress Neck: no carotid bruit and no JVD Lungs: clear to auscultation bilaterally Heart:  regular rate and rhythm, S1, S2 normal, no murmur, click, rub or gallop Abdomen: soft, non-tender; bowel sounds normal; no masses,  no organomegaly Extremities: extremities normal, atraumatic, no cyanosis or edema Pulses: 2+ and symmetric Skin: Skin color, texture, turgor normal. No rashes or lesions Neurologic: Grossly normal Psych: Mood, affect normal  EKG: Normal sinus rhythm, minimal voltage criteria for LVH, nonspecific T wave changes at 71  ASSESSMENT: 1. Chest pain, concerning for unstable angina 2. Dyslipidemia - last LDL 130 in 01/2013, not on statin 3. Uncontrolled hypertension 4. Obesity 5. History of TBI with headaches and memory loss 6. Strong family history of premature coronary artery disease  PLAN: 1.   Mrs. Advincula is complaining of chest pain which is relieved by rest and nitroglycerin, but sometimes occurs without activity. She is desiring to start exercise and weight loss program and feel that risk stratification and exclusion of coronary artery disease is warranted. I would recommend an exercise nuclear stress test, based on her risk factors and strong family history. In addition she will need improvement in her cholesterol.  She has not had assessment an almost one year, would like to get a lipid NMR as well as a CMP.  Her blood pressure is elevated today she reports that it has been creeping up some. I will plan to recheck at her next office visit and if it remains elevated, likely switch her losartan over to a combination of a later generation ARB/diuretic.  Plan for followup in 2 weeks.  Thanks again for the kind referral.  Pixie Casino, MD, [facility] Attending Cardiologist [facility]  HILTY,Kenneth C 12/13/2013, 8:38 AM

## 2013-12-19 ENCOUNTER — Ambulatory Visit (HOSPITAL_COMMUNITY)
Admission: RE | Admit: 2013-12-19 | Discharge: 2013-12-19 | Disposition: A | Discharge status: Home / Self Care | Payer: BC Managed Care – PPO | Source: Ambulatory Visit | Attending: Internal Medicine | Admitting: Internal Medicine

## 2013-12-19 DIAGNOSIS — R079 Chest pain, unspecified: Secondary | ICD-10-CM | POA: Insufficient documentation

## 2013-12-19 DIAGNOSIS — Z8249 Family history of ischemic heart disease and other diseases of the circulatory system: Secondary | ICD-10-CM

## 2013-12-19 DIAGNOSIS — I10 Essential (primary) hypertension: Secondary | ICD-10-CM | POA: Insufficient documentation

## 2013-12-19 DIAGNOSIS — E785 Hyperlipidemia, unspecified: Secondary | ICD-10-CM | POA: Insufficient documentation

## 2013-12-19 DIAGNOSIS — R9431 Abnormal electrocardiogram [ECG] [EKG]: Secondary | ICD-10-CM | POA: Insufficient documentation

## 2013-12-19 LAB — NMR LIPOPROFILE WITH LIPIDS
Cholesterol, Total: 206 mg/dL — ABNORMAL HIGH (ref <200)
HDL Particle Number: 34.8 umol/L (ref >=30.5)
HDL SIZE: 9.8 nm (ref >=9.2)
HDL-C: 70 mg/dL (ref >=40)
LARGE VLDL-P: 2.8 nmol/L — AB (ref <=2.7)
LDL (calc): 114 mg/dL — ABNORMAL HIGH (ref <100)
LDL Particle Number: 1347 nmol/L — ABNORMAL HIGH (ref <1000)
LDL Size: 21.5 nm (ref >20.5)
LP-IR Score: 27 (ref <=45)
Large HDL-P: 10.6 umol/L (ref >=4.8)
Small LDL Particle Number: 303 nmol/L (ref <=527)
Triglycerides: 111 mg/dL (ref <150)
VLDL Size: 48.7 nm — ABNORMAL HIGH (ref <=46.6)

## 2013-12-19 LAB — COMPREHENSIVE METABOLIC PANEL
ALT: 22 U/L (ref 0–35)
AST: 25 U/L (ref 0–37)
Albumin: 3.9 g/dL (ref 3.5–5.2)
Alkaline Phosphatase: 72 U/L (ref 39–117)
BUN: 9 mg/dL (ref 6–23)
CALCIUM: 8.9 mg/dL (ref 8.4–10.5)
CO2: 28 mEq/L (ref 19–32)
Chloride: 106 mEq/L (ref 96–112)
Creat: 0.77 mg/dL (ref 0.50–1.10)
Glucose, Bld: 93 mg/dL (ref 70–99)
Potassium: 3.5 mEq/L (ref 3.5–5.3)
Sodium: 143 mEq/L (ref 135–145)
TOTAL PROTEIN: 6.5 g/dL (ref 6.0–8.3)
Total Bilirubin: 0.4 mg/dL (ref 0.3–1.2)

## 2013-12-19 MED ORDER — TECHNETIUM TC 99M SESTAMIBI GENERIC - CARDIOLITE
30.0000 | Freq: Once | INTRAVENOUS | Status: AC | PRN
  Administered 2013-12-19: 30 via INTRAVENOUS

## 2013-12-19 MED ORDER — TECHNETIUM TC 99M SESTAMIBI GENERIC - CARDIOLITE
10.0000 | Freq: Once | INTRAVENOUS | Status: AC | PRN
  Administered 2013-12-19: 10 via INTRAVENOUS

## 2013-12-19 NOTE — Procedures (Addendum)
[place] [address] [place] [address] [phone]  Cardiology Nuclear Med Study  [name] is a 63 y.o. female     MRN : [id number]     DOB: 1951/07/05  Procedure Date: 12/19/2013  Nuclear Med Background Indication for Stress Test:  Evaluation for Ischemia History:  Asthma Cardiac Risk Factors: Family History - CAD, Hypertension, Lipids and Obesity  Symptoms:  Chest Pain, Dizziness, DOE, Fatigue, Light-Headedness and Palpitations   Nuclear Pre-Procedure Caffeine/Decaff Intake:  7:00pm NPO After: 5:00am   IV Site: R Antecubital  IV 0.9% NS with Angio Cath:  22g  Chest Size (in):  n/a IV Started by: Azucena Cecil, RN  Height: 5' 3.5" (1.613 m)  Cup Size: D  BMI:  Body mass index is 37.83 kg/(m^2). Weight:  217 lb (98.431 kg)   Tech Comments:  n/a    Nuclear Med Study 1 or 2 day study: 1 day  Stress Test Type:  Stress  Order Authorizing Provider:  Lyman Bishop, MD    Resting Radionuclide: Technetium 80m Sestamibi  Resting Radionuclide Dose: 10.5 mCi   Stress Radionuclide:  Technetium 26m Sestamibi  Stress Radionuclide Dose: 31.2 mCi           Stress Protocol Rest HR: 67 Stress HR: 162  Rest BP: 194/108 Stress BP: 213/97  Exercise Time (min): 5:24 METS: 7.0   Predicted Max HR: 159 bpm % Max HR: 101.89 bpm Rate Pressure Product: 34506  Dose of Adenosine (mg):  n/a Dose of Lexiscan: n/a mg  Dose of Atropine (mg): n/a Dose of Dobutamine: n/a mcg/kg/min (at max HR)  Stress Test Technologist: Leane Para, CCT Nuclear Technologist: Otho Perl, CNMT   Rest Procedure:  Myocardial perfusion imaging was performed at rest 45 minutes following the intravenous administration of Technetium 39m Sestamibi. Stress Procedure:  The patient performed treadmill exercise using a Bruce  Protocol for 5:24 minutes. The patient stopped due to SOB and Fatigue and denied any chest pain.  There were no  significant ST-T wave changes.  Technetium 71m Sestamibi was injected at peak exercise and myocardial perfusion imaging was performed after a brief delay.  Transient Ischemic Dilatation (Normal <1.22):  0.98 Lung/Heart Ratio (Normal <0.45):  0.27 QGS EDV:  81 ml QGS ESV:  33 ml LV Ejection Fraction: 59%  Signed by Otho Perl, CNMT  PHYSICIAN INTERPRETATION.  Rest ECG: NSR with non-specific ST-T wave changes  Stress ECG: Borderline ST (some beats upo-sloping & other true horizontal segment depression. Not diagnostic.   Defer nuclear images.  QPS Raw Data Images:  Acquisition technically good; normal left ventricular size. Stress Images:  Normal homogeneous uptake in all areas of the myocardium. Rest Images:  Normal homogeneous uptake in all areas of the myocardium. Subtraction (SDS):  There is no evidence of scar or ischemia.  Impression Exercise Capacity:  Poor exercise capacity. BP Response:  Normal blood pressure response. Clinical Symptoms:  No chest pain. ECG Impression:  Insignificant upsloping ST segment depression. Comparison with Prior Nuclear Study: No images to compare  Overall Impression:  Low risk stress nuclear study No scintigraphic evidence of myocardial ishcemia..  LV Wall Motion:  NL LV Function; NL Wall Motion   Trusten Hume W, MD  12/19/2013 6:14 PM

## 2014-01-08 ENCOUNTER — Encounter: Payer: Self-pay | Admitting: Internal Medicine

## 2014-01-08 ENCOUNTER — Ambulatory Visit (INDEPENDENT_AMBULATORY_CARE_PROVIDER_SITE_OTHER): Payer: BC Managed Care – PPO | Admitting: Internal Medicine

## 2014-01-08 VITALS — BP 146/86 | HR 80 | Ht 64.0 in | Wt 217.7 lb

## 2014-01-08 DIAGNOSIS — K219 Gastro-esophageal reflux disease without esophagitis: Secondary | ICD-10-CM

## 2014-01-08 DIAGNOSIS — E785 Hyperlipidemia, unspecified: Secondary | ICD-10-CM

## 2014-01-08 DIAGNOSIS — E669 Obesity, unspecified: Secondary | ICD-10-CM

## 2014-01-08 DIAGNOSIS — R079 Chest pain, unspecified: Secondary | ICD-10-CM

## 2014-01-08 DIAGNOSIS — I10 Essential (primary) hypertension: Secondary | ICD-10-CM

## 2014-01-08 MED ORDER — VALSARTAN-HYDROCHLOROTHIAZIDE 160-12.5 MG PO TABS
1.0000 | ORAL_TABLET | Freq: Every day | ORAL | Status: DC
Start: 2014-01-08 — End: 2014-01-15

## 2014-01-08 NOTE — Patient Instructions (Addendum)
Dr Debara Pickett has made the following medication changes:  STOP Losartan START Diovan HCT 160-12.5 mg - take 1 tablet daily.  Please schedule a blood pressure check with Erasmo Downer, our pharmacist, in one week.  Dr Debara Pickett wants you to follow-up in 6 months. You will receive a reminder letter in the mail two months in advance. If you don't receive a letter, please call our office to schedule the follow-up appointment.

## 2014-01-08 NOTE — Progress Notes (Signed)
OFFICE NOTE  Chief Complaint:  Chest pain  Primary Care Physician: Lamar Blinks, MD  HPI:  [name] is a pleasant 63 year old female with a strong family history of heart disease. Her mother died of a sudden heart attack at age 62, her both grandparents had heart disease and a grandmother had a stroke at age 85. She was seen remotely by cardiologist more than 3 years ago and had stress testing at that time which was negative. Back in November she was complaining of chest pain which was intermittent. It was somewhat worse at that time however she had an active bronchitis and was treated. Her symptoms improved somewhat however she still had some chest pain since that time. Some of it is associated with exertion and relieved by rest and other times it comes fairly randomly. It does feel like a squeezing sensation in the upper chest it does not radiate to the neck, arm or back. She describes the discomfort as a 5 or 6/10 in severity.  She occasionally takes nitroglycerin for this however her old prescription was only recently renewed.  She is desiring to start an exercise program and would like cardiovascular evaluation and risk stratification prior to that.  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  [name] returns today for followup of her nuclear stress test.   This demonstrated no reversible ischemia. She continues to have intermittent shortness of breath with exertion and short-lived chest discomfort. She also had a lipid NMR performed, which demonstrated elevated LDL cholesterol of 117, with elevated LDL particle numbers to 1347.  Her contents a metabolic profile was normal.  Finally, her blood pressure is noted to be elevated again today and read 146/86 in the office. It has been high in  the 150s routinely.  PMHx:  Past Medical History  Diagnosis Date  . Headaches due to old head injury   . Hypertension   . Thyroid disease   . Reflux   . Arthritis   . GERD (gastroesophageal reflux disease)   . Depression   . Allergy   . Dyslipidemia   . Obesity     Past Surgical History  Procedure Laterality Date  . Facet joint injection  2011 and 2012  . Breast surgery  2003    reduction  . Thyroidectomy  1989  . Tubal ligation    . Abdominal hysterectomy    . Myomectomy    . Cesarean section    . Transthoracic echocardiogram  03/2009    EF=>55%, borderline conc LVH; mild mitral annular calcif, trace MR; trace TR; AV mildly sclerotic, mild AV regurg; mild pulm valve regurg   . Nm myocar perf wall motion  03/2009    dipyridamole; normal pattern of perfusion in all regions, post-stress EF 65%, normal study     FAMHx:  Family History  Problem Relation Age of Onset  . Heart disease Mother     MI, HTN  . Heart disease Maternal Grandmother     MI, CVA  . Heart disease Maternal Grandfather     MI  . Stroke Maternal Grandfather   . Heart disease Paternal Grandmother   . Stroke Paternal Grandmother   . Heart disease Paternal Grandfather     MI  . Glaucoma Father   . Diabetes Father   .  Depression Daughter   . ADD / ADHD Son   . Heart attack Paternal Uncle     HTN    SOCHx:   reports that she has never smoked. She has never used smokeless tobacco. She reports that she drinks about 0.5 ounces of alcohol per week. She reports that she does not use illicit drugs.  ALLERGIES:  Allergies  Allergen Reactions  . Botox [Botulinum Toxin Type A]   . Toprol Xl [Metoprolol Succinate] Hives    ROS: A comprehensive review of systems was negative except for: Constitutional: positive for weight gain Cardiovascular: positive for chest pressure/discomfort Neurological: positive for headaches and memory problems  HOME MEDS: Current Outpatient Prescriptions  Medication Sig  Dispense Refill  . ALPRAZolam (XANAX) 0.25 MG tablet Take 0.25 mg by mouth 2 (two) times daily as needed for sleep.      Marland Kitchen aspirin 81 MG tablet Take 81 mg by mouth daily.      . butalbital-acetaminophen-caffeine (FIORICET WITH CODEINE) 50-325-40-30 MG per capsule Take 1 capsule by mouth every 4 (four) hours as needed.      . desvenlafaxine (PRISTIQ) 100 MG 24 hr tablet Take 100 mg by mouth daily.      . Diclofenac Potassium (CAMBIA PO) Take 50 mg by mouth as needed.       . diclofenac sodium (VOLTAREN) 1 % GEL Apply 2 g topically 4 (four) times daily.  100 g  3  . Eszopiclone (ESZOPICLONE) 3 MG TABS Take 3 mg by mouth at bedtime. Take immediately before bedtime      . frovatriptan (FROVA) 2.5 MG tablet Take 2.5 mg by mouth as needed for migraine. If recurs, may repeat after 2 hours. Max of 3 tabs in 24 hours.      . gabapentin (NEURONTIN) 300 MG capsule Take 300 mg by mouth 3 (three) times daily.      Marland Kitchen HYDROcodone-acetaminophen (NORCO) 7.5-325 MG per tablet Take 1 tablet by mouth 2 (two) times daily as needed for pain.      Marland Kitchen ipratropium (ATROVENT) 0.03 % nasal spray Place 2 sprays into the nose 4 (four) times daily.  30 mL  1  . levothyroxine (SYNTHROID, LEVOTHROID) 100 MCG tablet Take 1 tablet (100 mcg total) by mouth daily.  90 tablet  3  . NIFEdipine (PROCARDIA XL/ADALAT-CC) 60 MG 24 hr tablet Take 1 tablet (60 mg total) by mouth daily.  90 tablet  3  . nitroGLYCERIN (NITROSTAT) 0.4 MG SL tablet Place 1 tablet (0.4 mg total) under the tongue every 5 (five) minutes as needed.  30 tablet  0  . omeprazole (PRILOSEC) 40 MG capsule Take 40 mg by mouth daily.      Marland Kitchen oxyCODONE-acetaminophen (ROXICET) 5-325 MG per tablet Take 1 tablet by mouth every 8 (eight) hours as needed for pain.  20 tablet  0  . tiZANidine (ZANAFLEX) 4 MG capsule Take 4 mg by mouth 3 (three) times daily.      . valACYclovir (VALTREX) 500 MG tablet Take 1 tablet (500 mg total) by mouth 2 (two) times daily. Take for 3 days as  needed for outbreaks.  30 tablet  0  . valsartan-hydrochlorothiazide (DIOVAN HCT) 160-12.5 MG per tablet Take 1 tablet by mouth daily.  30 tablet  11   No current facility-administered medications for this visit.    LABS/IMAGING: No results found for this or any previous visit (from the past 48 hour(s)). No results found.  VITALS: BP 146/86  Pulse 80  Ht  5\' 4"  (1.626 m)  Wt 217 lb 11.2 oz (98.748 kg)  BMI 37.35 kg/m2  EXAM: deferred  EKG: deferred  ASSESSMENT: 1. Chest pain - negative nuclear stress test 2. Dyslipidemia - last LDL 130 in 01/2013, not on statin 3. Uncontrolled hypertension 4. Obesity 5. History of TBI with headaches and memory loss 6. Strong family history of premature coronary artery disease  PLAN: 1.   [name] had a negative nuclear stress test which was low risk. I think this is fairly reassuring as her symptoms are not typical. Her cholesterol remains elevated however her LDL is improved since her last study in March of 2014. Overall her LDL control is good and particle numbers only mildly elevated. I would recommend continuing work on dietary changes, weight loss and exercise. She should be able to reach her cholesterol targets with this. She did not reach that goal in 6 months, she may need low-dose statin therapy. I would recommend discontinuing losartan today and starting Diovan/HCT 160/12.5 mg daily.  I will have her followup with Cyril Mourning, or hypertension pharmacist for followup of her blood pressure in one week period and then I will see her back in 6 months.  Thanks again for the kind referral.  Pixie Casino, MD, [facility] Attending Cardiologist [facility]  Nafisa Olds C 01/08/2014, 7:19 PM

## 2014-01-15 ENCOUNTER — Ambulatory Visit (INDEPENDENT_AMBULATORY_CARE_PROVIDER_SITE_OTHER): Payer: BC Managed Care – PPO | Admitting: Pharmacist Clinician (PhC)/ Clinical Pharmacy Specialist

## 2014-01-15 ENCOUNTER — Encounter: Payer: Self-pay | Admitting: Pharmacist Clinician (PhC)/ Clinical Pharmacy Specialist

## 2014-01-15 VITALS — BP 164/92 | HR 68 | Ht 64.0 in | Wt 219.3 lb

## 2014-01-15 DIAGNOSIS — I10 Essential (primary) hypertension: Secondary | ICD-10-CM

## 2014-01-15 MED ORDER — VALSARTAN-HYDROCHLOROTHIAZIDE 320-25 MG PO TABS: 1.0000 | ORAL_TABLET | Freq: Every day | ORAL | Status: DC

## 2014-01-15 NOTE — Progress Notes (Signed)
01/15/2014 HARVEST DEIST 1951-07-05 [id number]   HPI:  [name] is a 63 y.o. female patient of Dr Debara Pickett, with a PMH below who presents today for a blood pressure check.  She saw Dr. Debara Pickett last week and was switched from losartan 100mg  to valsartan hct 160/12.5mg .  Her BP at that time was 146/86, however she noted to him that it tended to run in the 941D systolic at home.  [name] has a strong family history of heart disease, including 3 grandparents a paternal uncle and mother all having an MI.  At this time neither of her children appear to have any problems.  She has had hypertension for >20 years and been on the losartan since "it was a new drug".   She does not use tobacco products, rarely drinks alcohol and drinks only minimal caffeine.  She eats most of her meals at home and does not cook with added salt.  She has recently started an aerobics program 2-3 times per week and tries to walk 1/4-1/2 mile on the days she does not go to aerobics.  Today she brings some home BP readings from the past week, ranging from 142/90 to 189/102.  Her BP cuff is a newer Omron model that she purchased approximately 1 year ago.   She admits to some issues with medication compliance, states that she gets her meds in every day, but not always at the same time.   Current Outpatient Prescriptions  Medication Sig Dispense Refill  . ALPRAZolam (XANAX) 0.25 MG tablet Take 0.25 mg by mouth 2 (two) times daily as needed for sleep.      Marland Kitchen aspirin 81 MG tablet Take 81 mg by mouth daily.      . butalbital-acetaminophen-caffeine (FIORICET WITH CODEINE) 50-325-40-30 MG per capsule Take 1 capsule by mouth every 4 (four) hours as needed.      . desvenlafaxine (PRISTIQ) 100 MG 24 hr tablet Take 100 mg by mouth daily.      . Diclofenac Potassium (CAMBIA PO) Take 50 mg by mouth as needed.       . diclofenac sodium (VOLTAREN) 1 % GEL Apply 2 g topically 4 (four) times daily.  100 g  3  .  Eszopiclone (ESZOPICLONE) 3 MG TABS Take 3 mg by mouth at bedtime. Take immediately before bedtime      . frovatriptan (FROVA) 2.5 MG tablet Take 2.5 mg by mouth as needed for migraine. If recurs, may repeat after 2 hours. Max of 3 tabs in 24 hours.      . gabapentin (NEURONTIN) 300 MG capsule Take 300 mg by mouth 3 (three) times daily.      Marland Kitchen HYDROcodone-acetaminophen (NORCO) 7.5-325 MG per tablet Take 1 tablet by mouth 2 (two) times daily as needed for pain.      Marland Kitchen ipratropium (ATROVENT) 0.03 % nasal spray Place 2 sprays into the nose 4 (four) times daily.  30 mL  1  . levothyroxine (SYNTHROID, LEVOTHROID) 100 MCG tablet Take 1 tablet (100 mcg total) by mouth daily.  90 tablet  3  . NIFEdipine (PROCARDIA XL/ADALAT-CC) 60 MG 24 hr tablet Take 1 tablet (60 mg total) by mouth daily.  90 tablet  3  . nitroGLYCERIN (NITROSTAT) 0.4 MG SL tablet Place 1 tablet (0.4 mg total) under the tongue every 5 (five) minutes as needed.  30 tablet  0  . omeprazole (PRILOSEC) 40 MG capsule Take 40 mg by mouth daily.      Marland Kitchen  oxyCODONE-acetaminophen (ROXICET) 5-325 MG per tablet Take 1 tablet by mouth every 8 (eight) hours as needed for pain.  20 tablet  0  . tiZANidine (ZANAFLEX) 4 MG capsule Take 4 mg by mouth 3 (three) times daily.      . valACYclovir (VALTREX) 500 MG tablet Take 1 tablet (500 mg total) by mouth 2 (two) times daily. Take for 3 days as needed for outbreaks.  30 tablet  0  . valsartan-hydrochlorothiazide (DIOVAN HCT) 160-12.5 MG per tablet Take 1 tablet by mouth daily.  30 tablet  11   No current facility-administered medications for this visit.    Allergies  Allergen Reactions  . Botox [Botulinum Toxin Type A]   . Toprol Xl [Metoprolol Succinate] Hives    Past Medical History  Diagnosis Date  . Headaches due to old head injury   . Hypertension   . Thyroid disease   . Reflux   . Arthritis   . GERD (gastroesophageal reflux disease)   . Depression   . Allergy   . Dyslipidemia   . Obesity      Blood pressure 164/92, pulse 68, height 5\' 4"  (1.626 m), weight 219 lb 4.8 oz (99.474 kg).   ASSESSMENT AND PLAN:  Today [name]'s blood pressure remains elevated.  It is actually higher than when she was in to see Dr. Debara Pickett last week.  She has had no problems with converting to the valsartan hct 160/12.5, so I am going to double that dose to 320/25mg .  I have asked her to continue monitoring home BP readings several times per week and gave her a log sheet to record those values.  We checked her Omron cuff in the office today and found it to be quite accurate, reading 166/92, HR 66.  I have encouraged her to continue with the exercise and monitor her diet to avoid high sodium foods.  I will see her back in 1 month for a follow up appointment.  Tommy Medal PharmD CPP [facility]

## 2014-01-15 NOTE — Patient Instructions (Signed)
Return for follow up blood pressure check in 1 month  Your blood pressure today is high at 164/92  Check your blood pressure at home 3-5 times per week and keep record of the readings.  Double your valsartan hct 160/32 to 320/25mg   Bring all of your meds, your BP cuff and your record of home blood pressures to your next appointment.  Exercise as you're able, try to walk approximately 30 minutes per day.  Keep salt intake to a minimum, especially watch canned and prepared boxed foods.  Eat more fresh fruits and vegetables and fewer canned items.  Avoid eating in fast food restaurants.   HOW TO TAKE YOUR BLOOD PRESSURE:   Rest 5 minutes before taking your blood pressure.    Don't smoke or drink caffeinated beverages for at least 30 minutes before.   Take your blood pressure before (not after) you eat.   Sit comfortably with your back supported and both feet on the floor (don't cross your legs).   Elevate your arm to heart level on a table or a desk.   Use the proper sized cuff. It should fit smoothly and snugly around your bare upper arm. There should be enough room to slip a fingertip under the cuff. The bottom edge of the cuff should be 1 inch above the crease of the elbow.   Ideally, take 3 measurements at one sitting and record the average.

## 2014-02-05 ENCOUNTER — Other Ambulatory Visit: Payer: Self-pay | Admitting: Family Medicine

## 2014-02-12 ENCOUNTER — Ambulatory Visit (INDEPENDENT_AMBULATORY_CARE_PROVIDER_SITE_OTHER): Payer: BC Managed Care – PPO | Admitting: Pharmacist Clinician (PhC)/ Clinical Pharmacy Specialist

## 2014-02-12 VITALS — BP 160/90 | Ht 64.0 in | Wt 215.5 lb

## 2014-02-12 DIAGNOSIS — I10 Essential (primary) hypertension: Secondary | ICD-10-CM

## 2014-02-12 MED ORDER — AMLODIPINE BESYLATE 5 MG PO TABS: 5.0000 mg | ORAL_TABLET | Freq: Every day | ORAL | Status: DC

## 2014-02-12 NOTE — Patient Instructions (Signed)
Return for a a follow up appointment in 6 wks  Your blood pressure today is 160/90  Check your blood pressure at home daily (if able) and keep record of the readings.  Take your BP meds as follows - continue with valsartan/hct each morning and switch from nifedipine (procardia) to amlodipine 5mg  and take this one in the evenings  Bring your record of home blood pressures to your next appointment.  Exercise as you're able, try to walk approximately 30 minutes per day.  Keep salt intake to a minimum, especially watch canned and prepared boxed foods.  Eat more fresh fruits and vegetables and fewer canned items.  Avoid eating in fast food restaurants.    HOW TO TAKE YOUR BLOOD PRESSURE:   Rest 5 minutes before taking your blood pressure.    Don't smoke or drink caffeinated beverages for at least 30 minutes before.   Take your blood pressure before (not after) you eat.   Sit comfortably with your back supported and both feet on the floor (don't cross your legs).   Elevate your arm to heart level on a table or a desk.   Use the proper sized cuff. It should fit smoothly and snugly around your bare upper arm. There should be enough room to slip a fingertip under the cuff. The bottom edge of the cuff should be 1 inch above the crease of the elbow.   Ideally, take 3 measurements at one sitting and record the average.

## 2014-02-13 ENCOUNTER — Encounter: Payer: Self-pay | Admitting: Pharmacist Clinician (PhC)/ Clinical Pharmacy Specialist

## 2014-02-13 NOTE — Progress Notes (Signed)
02/13/2014 LAKEN ROG [birth year] [id number]   HPI:  KELCY LAIBLE is a 63 y.o. female patient of Dr Debara Pickett, with a PMH below who presents today for a blood pressure check.  I saw her last on February 18 and at that time her BP was 164/92. We doubled her valsartan hct from 160/12.5 to 320/25 at that visit.  Unfortunately her pressures seem to be unchanged with the increase in dose.  Her lowest home reading was 140/82 and the highest was 194/90.  She brought her home meter in at her last visit and we found it to be quite accurate.  She does complain today of severe allergy problems including persistent cough and difficulty sleeping.  States this has been going on for several days.  She is not taking any decongestants, only chlorpheniramine and cetirizine.  Ms. Petion has a strong family history of heart disease, including 3 grandparents, paternal uncle and mother all having an MI.  At this time neither of her children appear to have any problems.  She has had hypertension for >20 years and previously been on losartan since "it was a new drug".   She does not use tobacco products, rarely drinks alcohol and drinks only minimal caffeine.  She eats most of her meals at home and does not cook with added salt.  She has recently started an aerobics program 2-3 times per week and tries to walk 1/4-1/2 mile on the days she does not go to aerobics.  She admits to some issues with medication compliance, states that she gets her meds in every day, but not always at the same time.   Current Outpatient Prescriptions  Medication Sig Dispense Refill  . ALPRAZolam (XANAX) 0.25 MG tablet Take 0.25 mg by mouth 2 (two) times daily as needed for sleep.      Marland Kitchen amLODipine (NORVASC) 5 MG tablet Take 1 tablet (5 mg total) by mouth daily.  30 tablet  3  . aspirin 81 MG tablet Take 81 mg by mouth daily.      . butalbital-acetaminophen-caffeine (FIORICET WITH CODEINE) 50-325-40-30 MG per capsule Take  1 capsule by mouth every 4 (four) hours as needed.      . desvenlafaxine (PRISTIQ) 100 MG 24 hr tablet Take 100 mg by mouth daily.      . Diclofenac Potassium (CAMBIA PO) Take 50 mg by mouth as needed.       . diclofenac sodium (VOLTAREN) 1 % GEL Apply 2 g topically 4 (four) times daily.  100 g  3  . Eszopiclone (ESZOPICLONE) 3 MG TABS Take 3 mg by mouth at bedtime. Take immediately before bedtime      . frovatriptan (FROVA) 2.5 MG tablet Take 2.5 mg by mouth as needed for migraine. If recurs, may repeat after 2 hours. Max of 3 tabs in 24 hours.      . gabapentin (NEURONTIN) 300 MG capsule Take 300 mg by mouth 3 (three) times daily.      Marland Kitchen HYDROcodone-acetaminophen (NORCO) 7.5-325 MG per tablet Take 1 tablet by mouth 2 (two) times daily as needed for pain.      Marland Kitchen ipratropium (ATROVENT) 0.03 % nasal spray Place 2 sprays into the nose 4 (four) times daily.  30 mL  1  . levothyroxine (SYNTHROID, LEVOTHROID) 100 MCG tablet Take 1 tablet (100 mcg total) by mouth daily.  90 tablet  3  . nitroGLYCERIN (NITROSTAT) 0.4 MG SL tablet Place 1 tablet (0.4 mg total) under the  tongue every 5 (five) minutes as needed.  30 tablet  0  . omeprazole (PRILOSEC) 40 MG capsule Take 40 mg by mouth daily.      Marland Kitchen oxyCODONE-acetaminophen (ROXICET) 5-325 MG per tablet Take 1 tablet by mouth every 8 (eight) hours as needed for pain.  20 tablet  0  . tiZANidine (ZANAFLEX) 4 MG capsule Take 4 mg by mouth 3 (three) times daily.      . valACYclovir (VALTREX) 500 MG tablet TAKE 1 TABLET BY MOUTH TWICE DAILY FOR 3 DAYS AS NEEDED FOR OUTBREAKS  30 tablet  2  . valsartan-hydrochlorothiazide (DIOVAN-HCT) 320-25 MG per tablet Take 1 tablet by mouth daily.  30 tablet  5   No current facility-administered medications for this visit.    Allergies  Allergen Reactions  . Botox [Botulinum Toxin Type A]   . Toprol Xl [Metoprolol Succinate] Hives    Past Medical History  Diagnosis Date  . Headaches due to old head injury   .  Hypertension   . Thyroid disease   . Reflux   . Arthritis   . GERD (gastroesophageal reflux disease)   . Depression   . Allergy   . Dyslipidemia   . Obesity     Blood pressure 160/90, height 5\' 4"  (1.626 m), weight 215 lb 8 oz (97.75 kg).   ASSESSMENT AND PLAN:  Today Ms. Hosack's blood pressure continues to remain elevated.  After doubling the valsartan I would have expected some decrease in her home readings.  She has been taking nifedipine xl 60mg  for some time.  She does complain about chest pain from time to time, but has no indication of vasospastic or chronic stable angina.  So I will go ahead and switch the Procardia to amlodipine 5mg .  She doe She will take this every evening and continue the higher dose valsartan hctz every morning.  If the number of chest pain episodes increases I have asked her to call me and we can switch back to the nifedipine.   I have asked her to continue monitoring home BP readings several times per week and gave her a log sheet to record those values.  I have encouraged her to continue with the exercise (she hasn't done any for the past 2-3 weeks) and monitor her diet to avoid high sodium foods.  I will see her back in 6 weeks for a follow up appointment.  Tommy Medal PharmD CPP [facility]

## 2014-03-26 ENCOUNTER — Encounter: Payer: Self-pay | Admitting: Pharmacist Clinician (PhC)/ Clinical Pharmacy Specialist

## 2014-03-26 ENCOUNTER — Ambulatory Visit (INDEPENDENT_AMBULATORY_CARE_PROVIDER_SITE_OTHER): Payer: BC Managed Care – PPO | Admitting: Pharmacist Clinician (PhC)/ Clinical Pharmacy Specialist

## 2014-03-26 VITALS — BP 174/96 | Ht 64.0 in | Wt 216.0 lb

## 2014-03-26 DIAGNOSIS — I10 Essential (primary) hypertension: Secondary | ICD-10-CM

## 2014-03-26 MED ORDER — AMLODIPINE BESYLATE 10 MG PO TABS: 10.0000 mg | ORAL_TABLET | Freq: Every day | ORAL | Status: DC

## 2014-03-26 NOTE — Progress Notes (Signed)
03/26/2014 [name] 11/21/1951 [id number]   HPI:  [name] is a 63 y.o. female patient of Dr Debara Pickett, with a PMH below who presents today for a blood pressure check.  I saw her last about a month ago, when her pressure was still in the 160/90 range.  We switched her off the procardia 60 mg and to amlodipine 5mg .  Today her pressures remain unchanged, and she states that the home readings are running from 160-190/90-100.  February 18 and at that time her BP was 164/92.  Previously we had doubled her valsartan/hctz, again found little to no change in her readings.   [name] has a strong family history of heart disease, including 3 grandparents, paternal uncle and mother all having an MI.  At this time neither of her children appear to have any problems.  She has had hypertension for >20 years and previously been on losartan since "it was a new drug".   She does not use tobacco products, rarely drinks alcohol and drinks only minimal caffeine.  She eats most of her meals at home and does not cook with added salt.  She states her compliance with medications has improved, on the other hand, she has already quit going to a new aerobics class and walking, in favor of working in her garden.  She states she has only used her migraine medication twice in the past month, although non-migraine headaches are frequent, for which she takes Fioricet (butalbital-APAP-caffeine).    Current Outpatient Prescriptions  Medication Sig Dispense Refill  . ALPRAZolam (XANAX) 0.25 MG tablet Take 0.25 mg by mouth 2 (two) times daily as needed for sleep.      Marland Kitchen amLODipine (NORVASC) 5 MG tablet Take 1 tablet (5 mg total) by mouth daily.  30 tablet  3  . aspirin 81 MG tablet Take 81 mg by mouth daily.      . butalbital-acetaminophen-caffeine (FIORICET WITH CODEINE) 50-325-40-30 MG per capsule Take 1 capsule by mouth every 4 (four) hours as needed.      . desvenlafaxine (PRISTIQ) 100 MG 24 hr  tablet Take 100 mg by mouth daily.      . Diclofenac Potassium (CAMBIA PO) Take 50 mg by mouth as needed.       . diclofenac sodium (VOLTAREN) 1 % GEL Apply 2 g topically 4 (four) times daily.  100 g  3  . Eszopiclone (ESZOPICLONE) 3 MG TABS Take 3 mg by mouth at bedtime. Take immediately before bedtime      . frovatriptan (FROVA) 2.5 MG tablet Take 2.5 mg by mouth as needed for migraine. If recurs, may repeat after 2 hours. Max of 3 tabs in 24 hours.      . gabapentin (NEURONTIN) 300 MG capsule Take 300 mg by mouth 3 (three) times daily.      Marland Kitchen HYDROcodone-acetaminophen (NORCO) 7.5-325 MG per tablet Take 1 tablet by mouth 2 (two) times daily as needed for pain.      Marland Kitchen ipratropium (ATROVENT) 0.03 % nasal spray Place 2 sprays into the nose 4 (four) times daily.  30 mL  1  . levothyroxine (SYNTHROID, LEVOTHROID) 100 MCG tablet Take 1 tablet (100 mcg total) by mouth daily.  90 tablet  3  . nitroGLYCERIN (NITROSTAT) 0.4 MG SL tablet Place 1 tablet (0.4 mg total) under the tongue every 5 (five) minutes as needed.  30 tablet  0  . omeprazole (PRILOSEC) 40 MG capsule Take 40 mg by mouth daily.      Marland Kitchen  oxyCODONE-acetaminophen (ROXICET) 5-325 MG per tablet Take 1 tablet by mouth every 8 (eight) hours as needed for pain.  20 tablet  0  . tiZANidine (ZANAFLEX) 4 MG capsule Take 4 mg by mouth 3 (three) times daily.      . valACYclovir (VALTREX) 500 MG tablet TAKE 1 TABLET BY MOUTH TWICE DAILY FOR 3 DAYS AS NEEDED FOR OUTBREAKS  30 tablet  2  . valsartan-hydrochlorothiazide (DIOVAN-HCT) 320-25 MG per tablet Take 1 tablet by mouth daily.  30 tablet  5   No current facility-administered medications for this visit.    Allergies  Allergen Reactions  . Botox [Botulinum Toxin Type A]   . Toprol Xl [Metoprolol Succinate] Hives    Past Medical History  Diagnosis Date  . Headaches due to old head injury   . Hypertension   . Thyroid disease   . Reflux   . Arthritis   . GERD (gastroesophageal reflux disease)    . Depression   . Allergy   . Dyslipidemia   . Obesity     Blood pressure 174/96, height 5\' 4"  (1.626 m), weight 216 lb (97.977 kg).   ASSESSMENT AND PLAN:  Today [name]'s blood pressure continues to remain elevated.  After doubling the valsartan I would have expected some decrease in her home readings.  Today we will increase the amlodipine to 10mg  once daily.   If we continue to see no improvement in her readings I would consider checking her for renal artery stenosis or looking into a sleep study.  I have asked her to continue monitoring home BP readings several times per week and gave her a log sheet to record those values.  I will see her back in about 3 weeks.  I have encouraged her to continue with the exercise (she hasn't done any for the past 2-3 weeks) and monitor her diet to avoid high sodium foods.    Tommy Medal PharmD CPP [facility]

## 2014-03-26 NOTE — Patient Instructions (Signed)
.  Return for a a follow up appointment in 3 weeks   Your blood pressure today is 174/96  Check your blood pressure at home daily (if able) and keep record of the readings.  Take your BP meds as follows - continue with valsartan hctz 320/25 and increase amlodipine to 10mg  each evening  Bring all of your meds, your BP cuff and your record of home blood pressures to your next appointment.  Exercise as you're able, try to walk approximately 30 minutes per day.  Keep salt intake to a minimum, especially watch canned and prepared boxed foods.  Eat more fresh fruits and vegetables and fewer canned items.  Avoid eating in fast food restaurants.    HOW TO TAKE YOUR BLOOD PRESSURE:   Rest 5 minutes before taking your blood pressure.    Don't smoke or drink caffeinated beverages for at least 30 minutes before.   Take your blood pressure before (not after) you eat.   Sit comfortably with your back supported and both feet on the floor (don't cross your legs).   Elevate your arm to heart level on a table or a desk.   Use the proper sized cuff. It should fit smoothly and snugly around your bare upper arm. There should be enough room to slip a fingertip under the cuff. The bottom edge of the cuff should be 1 inch above the crease of the elbow.   Ideally, take 3 measurements at one sitting and record the average.

## 2014-04-23 ENCOUNTER — Ambulatory Visit: Payer: Self-pay | Admitting: Pharmacist Clinician (PhC)/ Clinical Pharmacy Specialist

## 2014-05-12 ENCOUNTER — Ambulatory Visit (INDEPENDENT_AMBULATORY_CARE_PROVIDER_SITE_OTHER): Payer: BC Managed Care – PPO | Admitting: Pharmacist Clinician (PhC)/ Clinical Pharmacy Specialist

## 2014-05-12 VITALS — BP 156/86 | Ht 64.0 in | Wt 217.2 lb

## 2014-05-12 DIAGNOSIS — I10 Essential (primary) hypertension: Secondary | ICD-10-CM

## 2014-05-12 NOTE — Patient Instructions (Signed)
Return for a a follow up appointment in 2 months with Dr. Debara Pickett  Your blood pressure today is 156/86  Check your blood pressure at home 3 times per week and keep record of the readings.  Continue with BP meds as you have been  Bring all of your meds, your BP cuff and your record of home blood pressures to your next appointment.  Exercise as you're able, try to walk approximately 30 minutes per day.  Keep salt intake to a minimum, especially watch canned and prepared boxed foods.  Eat more fresh fruits and vegetables and fewer canned items.  Avoid eating in fast food restaurants.    HOW TO TAKE YOUR BLOOD PRESSURE:   Rest 5 minutes before taking your blood pressure.    Don't smoke or drink caffeinated beverages for at least 30 minutes before.   Take your blood pressure before (not after) you eat.   Sit comfortably with your back supported and both feet on the floor (don't cross your legs).   Elevate your arm to heart level on a table or a desk.   Use the proper sized cuff. It should fit smoothly and snugly around your bare upper arm. There should be enough room to slip a fingertip under the cuff. The bottom edge of the cuff should be 1 inch above the crease of the elbow.   Ideally, take 3 measurements at one sitting and record the average.

## 2014-05-14 ENCOUNTER — Encounter: Payer: Self-pay | Admitting: Pharmacist Clinician (PhC)/ Clinical Pharmacy Specialist

## 2014-05-14 NOTE — Progress Notes (Signed)
05/14/2014 [name] Oct 03, 1951 [id number]   HPI:  [name] is a 63 y.o. female patient of Dr Debara Pickett, with a PMH below who presents today for a blood pressure check.  When I saw her last about a 6 weeks ago, we had previously switched her Procardia to amlodipine 5 mg, then raised to 10mg  when her BP was unchanged.  We had also previously doubled her valsartan/hctz, again finding little to no change in her readings.   Ms. Morace has a strong family history of heart disease, including 3 grandparents, paternal uncle and mother all having an MI.  At this time neither of her children appear to have any problems.  She has had hypertension for >20 years and previously been on losartan since "it was a new drug".   She does not use tobacco products, rarely drinks alcohol and drinks only minimal caffeine.  She eats most of her meals at home and does not cook with added salt.  She has been trying to increase the number of vegetables she eats and decrease the starches.  She states her compliance with medications is good, but does no regular exercise other than working in her garden.  Today she comes in, stating she is having a migrane, which started early this am.  She has been checking her BP at home, and about 1/2 of those readings were ,150/90, with the highest being 527 systolic on a day she indicated she had forgotten to take her medications.    Current Outpatient Prescriptions  Medication Sig Dispense Refill  . ALPRAZolam (XANAX) 0.25 MG tablet Take 0.25 mg by mouth 2 (two) times daily as needed for sleep.      Marland Kitchen amLODipine (NORVASC) 10 MG tablet Take 1 tablet (10 mg total) by mouth daily.  30 tablet  5  . aspirin 81 MG tablet Take 81 mg by mouth daily.      . butalbital-acetaminophen-caffeine (FIORICET WITH CODEINE) 50-325-40-30 MG per capsule Take 1 capsule by mouth every 4 (four) hours as needed.      . desvenlafaxine (PRISTIQ) 100 MG 24 hr tablet Take 100 mg by mouth  daily.      . Diclofenac Potassium (CAMBIA PO) Take 50 mg by mouth as needed.       . diclofenac sodium (VOLTAREN) 1 % GEL Apply 2 g topically 4 (four) times daily.  100 g  3  . Eszopiclone (ESZOPICLONE) 3 MG TABS Take 3 mg by mouth at bedtime. Take immediately before bedtime      . frovatriptan (FROVA) 2.5 MG tablet Take 2.5 mg by mouth as needed for migraine. If recurs, may repeat after 2 hours. Max of 3 tabs in 24 hours.      . gabapentin (NEURONTIN) 300 MG capsule Take 300 mg by mouth 3 (three) times daily.      Marland Kitchen HYDROcodone-acetaminophen (NORCO) 7.5-325 MG per tablet Take 1 tablet by mouth 2 (two) times daily as needed for pain.      Marland Kitchen ipratropium (ATROVENT) 0.03 % nasal spray Place 2 sprays into the nose 4 (four) times daily.  30 mL  1  . levothyroxine (SYNTHROID, LEVOTHROID) 100 MCG tablet Take 1 tablet (100 mcg total) by mouth daily.  90 tablet  3  . nitroGLYCERIN (NITROSTAT) 0.4 MG SL tablet Place 1 tablet (0.4 mg total) under the tongue every 5 (five) minutes as needed.  30 tablet  0  . omeprazole (PRILOSEC) 40 MG capsule Take 40 mg by  mouth daily.      Marland Kitchen oxyCODONE-acetaminophen (ROXICET) 5-325 MG per tablet Take 1 tablet by mouth every 8 (eight) hours as needed for pain.  20 tablet  0  . tiZANidine (ZANAFLEX) 4 MG capsule Take 4 mg by mouth 3 (three) times daily.      . valACYclovir (VALTREX) 500 MG tablet TAKE 1 TABLET BY MOUTH TWICE DAILY FOR 3 DAYS AS NEEDED FOR OUTBREAKS  30 tablet  2  . valsartan-hydrochlorothiazide (DIOVAN-HCT) 320-25 MG per tablet Take 1 tablet by mouth daily.  30 tablet  5   No current facility-administered medications for this visit.    Allergies  Allergen Reactions  . Botox [Botulinum Toxin Type A]   . Toprol Xl [Metoprolol Succinate] Hives    Past Medical History  Diagnosis Date  . Headaches due to old head injury   . Hypertension   . Thyroid disease   . Reflux   . Arthritis   . GERD (gastroesophageal reflux disease)   . Depression   . Allergy    . Dyslipidemia   . Obesity     Blood pressure 156/86, height 5\' 4"  (1.626 m), weight 217 lb 3.2 oz (98.521 kg).   ASSESSMENT AND PLAN:  Her BP has definitely improved since increasing the amlodipine to 10mg  daily.  I'm not sure how much the migraine today is affecting her readings.  [facility] guidelines recommend for her age are that we keep her BP <150/90.  I will not make any changes today.  I have asked that she continue to keep a log of her home readings and I would like to see her again in about 6 weeks.  However, she is scheduled to see Dr. Debara Pickett in 2 months, so I will defer to that appointment.  As she has good renal function, if her pressure remains high, I would consider adding spironolactone to bring her readings down.     [name] PharmD CPP [facility]

## 2014-05-15 ENCOUNTER — Ambulatory Visit (INDEPENDENT_AMBULATORY_CARE_PROVIDER_SITE_OTHER): Payer: BC Managed Care – PPO | Admitting: Family Medicine

## 2014-05-15 ENCOUNTER — Other Ambulatory Visit: Payer: Self-pay | Admitting: Family Medicine

## 2014-05-15 VITALS — BP 138/80 | HR 63 | Temp 97.8°F | Resp 18 | Ht 63.5 in | Wt 213.0 lb

## 2014-05-15 DIAGNOSIS — A499 Bacterial infection, unspecified: Secondary | ICD-10-CM

## 2014-05-15 DIAGNOSIS — A5901 Trichomonal vulvovaginitis: Secondary | ICD-10-CM

## 2014-05-15 DIAGNOSIS — B9689 Other specified bacterial agents as the cause of diseases classified elsewhere: Secondary | ICD-10-CM

## 2014-05-15 DIAGNOSIS — N390 Urinary tract infection, site not specified: Secondary | ICD-10-CM

## 2014-05-15 DIAGNOSIS — N898 Other specified noninflammatory disorders of vagina: Secondary | ICD-10-CM

## 2014-05-15 LAB — POCT UA - MICROSCOPIC ONLY
CASTS, UR, LPF, POC: NEGATIVE
Crystals, Ur, HPF, POC: NEGATIVE
Mucus, UA: POSITIVE
Yeast, UA: NEGATIVE

## 2014-05-15 LAB — POCT WET PREP WITH KOH
Clue Cells Wet Prep HPF POC: 25
KOH PREP POC: NEGATIVE
TRICHOMONAS UA: POSITIVE
YEAST WET PREP PER HPF POC: NEGATIVE

## 2014-05-15 LAB — POCT URINALYSIS DIPSTICK
Glucose, UA: NEGATIVE
Ketones, UA: NEGATIVE
Nitrite, UA: NEGATIVE
Protein, UA: 100
Spec Grav, UA: 1.025
Urobilinogen, UA: 1
pH, UA: 6

## 2014-05-15 MED ORDER — METRONIDAZOLE 500 MG PO TABS: 2000.0000 mg | ORAL_TABLET | Freq: Once | ORAL | Status: DC

## 2014-05-15 NOTE — Patient Instructions (Signed)
Trichomoniasis °Trichomoniasis is an infection caused by an organism called Trichomonas. The infection can affect both women and men. In women, the outer female genitalia and the vagina are affected. In men, the penis is mainly affected, but the prostate and other reproductive organs can also be involved. Trichomoniasis is a sexually transmitted infection (STI) and is most often passed to another person through sexual contact.  °RISK FACTORS °· Having unprotected sexual intercourse. °· Having sexual intercourse with an infected partner. °SIGNS AND SYMPTOMS  °Symptoms of trichomoniasis in women include: °· Abnormal gray-green frothy vaginal discharge. °· Itching and irritation of the vagina. °· Itching and irritation of the area outside the vagina. °Symptoms of trichomoniasis in men include:  °· Penile discharge with or without pain. °· Pain during urination. This results from inflammation of the urethra. °DIAGNOSIS  °Trichomoniasis may be found during a Pap test or physical exam. Your health care provider may use one of the following methods to help diagnose this infection: °· Examining vaginal discharge under a microscope. For men, urethral discharge would be examined. °· Testing the pH of the vagina with a test tape. °· Using a vaginal swab test that checks for the Trichomonas organism. A test is available that provides results within a few minutes. °· Doing a culture test for the organism. This is not usually needed. °TREATMENT  °· You may be given medicine to fight the infection. Women should inform their health care provider if they could be or are pregnant. Some medicines used to treat the infection should not be taken during pregnancy. °· Your health care provider may recommend over-the-counter medicines or creams to decrease itching or irritation. °· Your sexual partner will need to be treated if infected. °HOME CARE INSTRUCTIONS  °· Take all medicine prescribed by your health care provider. °· Take  over-the-counter medicine for itching or irritation as directed by your health care provider. °· Do not have sexual intercourse while you have the infection. °· Women should not douche or wear tampons while they have the infection. °· Discuss your infection with your partner. Your partner may have gotten the infection from you, or you may have gotten it from your partner. °· Have your sex partner get examined and treated if necessary. °· Practice safe, informed, and protected sex. °· See your health care provider for other STI testing. °SEEK MEDICAL CARE IF:  °· You still have symptoms after you finish your medicine. °· You develop abdominal pain. °· You have pain when you urinate. °· You have bleeding after sexual intercourse. °· You develop a rash. °· Your medicine makes you sick or makes you throw up (vomit). °Document Released: 05/10/2001 Document Revised: 11/19/2013 Document Reviewed: 08/26/2013 °[employer]® Patient Information ©2015 [employer]. This information is not intended to replace advice given to you by your health care provider. Make sure you discuss any questions you have with your health care provider. ° °

## 2014-05-15 NOTE — Progress Notes (Addendum)
Subjective:    Patient ID: [name], female    DOB: 04/10/1951, 63 y.o.   MRN: [id number] Chief Complaint  Patient presents with  . Vaginal Discharge    HPI Noticed a thin watery yellow-green discharge for several weeks. No itching or pain but there is an odd odor. Did try monistat which slowed it down. No abd, n/v. 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 Separated from her husband a while ago and he did have sex w/ someone else. She has had trichomonas in the past and thinks it is that again.  Past Medical History  Diagnosis Date  . Headaches due to old head injury   . Hypertension   . Thyroid disease   . Reflux   . Arthritis   . GERD (gastroesophageal reflux disease)   . Depression   . Allergy   . Dyslipidemia   . Obesity    Current Outpatient Prescriptions on File Prior to Visit  Medication Sig Dispense Refill  . ALPRAZolam (XANAX) 0.25 MG tablet Take 0.25 mg by mouth 2 (two) times daily as needed for sleep.      Marland Kitchen amLODipine (NORVASC) 10 MG tablet Take 1 tablet (10 mg total) by mouth daily.  30 tablet  5  . aspirin 81 MG tablet Take 81 mg by mouth daily.      . butalbital-acetaminophen-caffeine (FIORICET WITH CODEINE) 50-325-40-30 MG per capsule Take 1 capsule by mouth every 4 (four) hours as needed.      . desvenlafaxine (PRISTIQ) 100 MG 24 hr tablet Take 100 mg by mouth daily.      . Diclofenac Potassium (CAMBIA PO) Take 50 mg by mouth as needed.       . diclofenac sodium (VOLTAREN) 1 % GEL Apply 2 g topically 4 (four) times daily.  100 g  3  . Eszopiclone (ESZOPICLONE) 3 MG TABS Take 3 mg by mouth at bedtime. Take immediately before bedtime      . frovatriptan (FROVA) 2.5 MG tablet Take 2.5 mg by mouth as needed for migraine. If recurs, may repeat after 2 hours. Max of 3 tabs in 24 hours.      . gabapentin (NEURONTIN) 300 MG capsule Take 300 mg by mouth 3 (three) times daily.      Marland Kitchen  HYDROcodone-acetaminophen (NORCO) 7.5-325 MG per tablet Take 1 tablet by mouth 2 (two) times daily as needed for pain.      Marland Kitchen ipratropium (ATROVENT) 0.03 % nasal spray Place 2 sprays into the nose 4 (four) times daily.  30 mL  1  . levothyroxine (SYNTHROID, LEVOTHROID) 100 MCG tablet Take 1 tablet (100 mcg total) by mouth daily.  90 tablet  3  . nitroGLYCERIN (NITROSTAT) 0.4 MG SL tablet Place 1 tablet (0.4 mg total) under the tongue every 5 (five) minutes as needed.  30 tablet  0  . omeprazole (PRILOSEC) 40 MG capsule Take 40 mg by mouth daily.      Marland Kitchen oxyCODONE-acetaminophen (ROXICET) 5-325 MG per tablet Take 1 tablet by mouth every 8 (eight) hours as needed for pain.  20 tablet  0  . tiZANidine (ZANAFLEX) 4 MG capsule Take 4 mg by mouth 3 (three) times daily.      . valACYclovir (VALTREX) 500 MG tablet TAKE 1 TABLET BY MOUTH TWICE DAILY FOR 3 DAYS AS NEEDED FOR OUTBREAKS  30 tablet  2  . valsartan-hydrochlorothiazide (DIOVAN-HCT) 320-25 MG per tablet Take 1  tablet by mouth daily.  30 tablet  5   No current facility-administered medications on file prior to visit.   Allergies  Allergen Reactions  . Botox [Botulinum Toxin Type A]   . Toprol Xl [Metoprolol Succinate] Hives      Review of Systems  Constitutional: Negative for fever, chills, diaphoresis, activity change, appetite change, fatigue and unexpected weight change.  Gastrointestinal: Negative for abdominal pain, diarrhea, constipation, blood in stool, anal bleeding and rectal pain.  Genitourinary: Positive for vaginal discharge. Negative for dysuria, urgency, frequency, hematuria, decreased urine volume, vaginal bleeding, difficulty urinating, genital sores, vaginal pain, menstrual problem, pelvic pain and dyspareunia.  Musculoskeletal: Negative for gait problem.  Skin: Negative for rash.  Hematological: Negative for adenopathy.  Psychiatric/Behavioral: The patient is not nervous/anxious.       BP 138/80  Pulse 63  Temp(Src)  97.8 F (36.6 C) (Oral)  Resp 18  Ht 5' 3.5" (1.613 m)  Wt 213 lb (96.616 kg)  BMI 37.13 kg/m2  SpO2 98% Objective:   Physical Exam  Constitutional: She is oriented to person, place, and time. She appears well-developed and well-nourished. No distress.  HENT:  Head: Normocephalic and atraumatic.  Cardiovascular: Normal rate, regular rhythm, normal heart sounds and intact distal pulses.   Pulmonary/Chest: Effort normal and breath sounds normal.  Abdominal: Soft. Bowel sounds are normal. She exhibits no distension. There is no tenderness. There is no rebound and no guarding.  Genitourinary: Uterus normal. Pelvic exam was performed with patient supine. There is no rash, tenderness or lesion on the right labia. There is no rash, tenderness or lesion on the left labia. Cervix exhibits no motion tenderness and no friability. Right adnexum displays no mass, no tenderness and no fullness. Left adnexum displays no mass, no tenderness and no fullness. There is erythema around the vagina. No tenderness around the vagina. Vaginal discharge found.  Moderate amount of frothy yellow thin discharge  Lymphadenopathy:       Right: No inguinal adenopathy present.       Left: No inguinal adenopathy present.  Neurological: She is alert and oriented to person, place, and time.  Skin: Skin is warm and dry. She is not diaphoretic.  Psychiatric: She has a normal mood and affect. Her behavior is normal.   Results for orders placed in visit on 05/15/14  POCT UA - MICROSCOPIC ONLY      Result Value Ref Range   WBC, Ur, HPF, POC 30-40     RBC, urine, microscopic 10-15     Bacteria, U Microscopic 3+     Mucus, UA positive     Epithelial cells, urine per micros 5-10     Crystals, Ur, HPF, POC neg     Casts, Ur, LPF, POC neg     Yeast, UA neg    POCT URINALYSIS DIPSTICK      Result Value Ref Range   Color, UA dark yellow     Clarity, UA cloudy     Glucose, UA neg     Bilirubin, UA small     Ketones, UA neg      Spec Grav, UA 1.025     Blood, UA small     pH, UA 6.0     Protein, UA 100     Urobilinogen, UA 1.0     Nitrite, UA neg     Leukocytes, UA large (3+)    POCT WET PREP WITH KOH      Result Value Ref Range  Trichomonas, UA Positive     Clue Cells Wet Prep HPF POC 25%     Epithelial Wet Prep HPF POC 10-15     Yeast Wet Prep HPF POC neg     Bacteria Wet Prep HPF POC 3+     RBC Wet Prep HPF POC 3-5     WBC Wet Prep HPF POC 20-30     KOH Prep POC Negative        Assessment & Plan:   Vaginal discharge - Plan: POCT UA - Microscopic Only, POCT urinalysis dipstick, POCT Wet Prep with KOH, GC/Chlamydia Probe Amp, Urine culture  Trichomonal vaginitis - Plan: Urine culture Husband needs treated as well to prevent re-infection. Abstain from intercourse x 1 wk after treatment and RTC for TOC in about 3 wks - ok to sched appt for this.  GBS UTI - amox 875 bid x 5d  Meds ordered this encounter  Medications  . metroNIDAZOLE (FLAGYL) 500 MG tablet    Sig: Take 4 tablets (2,000 mg total) by mouth once.    Dispense:  4 tablet    Refill:  0   Delman Cheadle, MD MPH

## 2014-05-17 LAB — GC/CHLAMYDIA PROBE AMP
CT PROBE, AMP APTIMA: NEGATIVE
GC Probe RNA: NEGATIVE

## 2014-05-17 LAB — URINE CULTURE: Colony Count: >=100000

## 2014-05-27 ENCOUNTER — Encounter: Payer: Self-pay | Admitting: Family Medicine

## 2014-05-28 MED ORDER — AMOXICILLIN 875 MG PO TABS: 875.0000 mg | ORAL_TABLET | Freq: Two times a day (BID) | ORAL | Status: DC

## 2014-05-28 NOTE — Addendum Note (Signed)
Addended by: Delman Cheadle on: 05/28/2014 09:49 AM   Modules accepted: Orders

## 2014-05-29 ENCOUNTER — Encounter: Payer: Self-pay | Admitting: Family Medicine

## 2014-07-15 ENCOUNTER — Encounter: Payer: Self-pay | Admitting: Internal Medicine

## 2014-07-15 ENCOUNTER — Ambulatory Visit (INDEPENDENT_AMBULATORY_CARE_PROVIDER_SITE_OTHER): Payer: BC Managed Care – PPO | Admitting: Internal Medicine

## 2014-07-15 VITALS — BP 112/78 | HR 61 | Ht 63.0 in | Wt 214.5 lb

## 2014-07-15 DIAGNOSIS — E669 Obesity, unspecified: Secondary | ICD-10-CM

## 2014-07-15 DIAGNOSIS — S069X0D Unspecified intracranial injury without loss of consciousness, subsequent encounter: Secondary | ICD-10-CM

## 2014-07-15 DIAGNOSIS — G43909 Migraine, unspecified, not intractable, without status migrainosus: Secondary | ICD-10-CM

## 2014-07-15 DIAGNOSIS — S069X9A Unspecified intracranial injury with loss of consciousness of unspecified duration, initial encounter: Secondary | ICD-10-CM | POA: Insufficient documentation

## 2014-07-15 DIAGNOSIS — R0789 Other chest pain: Secondary | ICD-10-CM

## 2014-07-15 DIAGNOSIS — S069XAA Unspecified intracranial injury with loss of consciousness status unknown, initial encounter: Secondary | ICD-10-CM | POA: Insufficient documentation

## 2014-07-15 DIAGNOSIS — E785 Hyperlipidemia, unspecified: Secondary | ICD-10-CM

## 2014-07-15 DIAGNOSIS — G43109 Migraine with aura, not intractable, without status migrainosus: Secondary | ICD-10-CM

## 2014-07-15 DIAGNOSIS — I10 Essential (primary) hypertension: Secondary | ICD-10-CM

## 2014-07-15 DIAGNOSIS — Z5189 Encounter for other specified aftercare: Secondary | ICD-10-CM

## 2014-07-15 LAB — LIPID PANEL
CHOLESTEROL: 196 mg/dL (ref 0–200)
HDL: 66 mg/dL (ref >39)
LDL Cholesterol: 111 mg/dL — ABNORMAL HIGH (ref 0–99)
Total CHOL/HDL Ratio: 3 Ratio
Triglycerides: 94 mg/dL (ref <150)
VLDL: 19 mg/dL (ref 0–40)

## 2014-07-15 NOTE — Progress Notes (Signed)
OFFICE NOTE  Chief Complaint:  No complaints  Primary Care Physician: Shelby Blinks, MD  HPI:  [name] is a pleasant 63 year old female with a strong family history of heart disease. Her mother died of a sudden heart attack at age 61, her both grandparents had heart disease and a grandmother had a stroke at age 62. She was seen remotely by cardiologist more than 3 years ago and had stress testing at that time which was negative. Back in November she was complaining of chest pain which was intermittent. It was somewhat worse at that time however she had an active bronchitis and was treated. Her symptoms improved somewhat however she still had some chest pain since that time. Some of it is associated with exertion and relieved by rest and other times it comes fairly randomly. It does feel like a squeezing sensation in the upper chest it does not radiate to the neck, arm or back. She describes the discomfort as a 5 or 6/10 in severity.  She occasionally takes nitroglycerin for this however her old prescription was only recently renewed.  She is desiring to start an exercise program and would like cardiovascular evaluation and risk stratification prior to that.  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  [name] had a recent nuclear stress test which was negative.  This demonstrated no reversible ischemia. She also had a lipid NMR performed, which demonstrated elevated LDL cholesterol of 117, with elevated LDL particle numbers to 1347.  Her contents a metabolic profile was normal.  Her blood pressure today is much improved. She denies any further chest pain and only occasionally has migraine headaches. We have not assessed her cholesterol since her last lipid profile and she's not on treatment for  her elevated cholesterol.  PMHx:  Past Medical History  Diagnosis Date  . Headaches due to old head injury   . Hypertension   . Thyroid disease   . Reflux   . Arthritis   . GERD (gastroesophageal reflux disease)   . Depression   . Allergy   . Dyslipidemia   . Obesity     Past Surgical History  Procedure Laterality Date  . Facet joint injection  2011 and 2012  . Breast surgery  2003    reduction  . Thyroidectomy  1989  . Tubal ligation    . Abdominal hysterectomy    . Myomectomy    . Cesarean section    . Transthoracic echocardiogram  03/2009    EF=>55%, borderline conc LVH; mild mitral annular calcif, trace MR; trace TR; AV mildly sclerotic, mild AV regurg; mild pulm valve regurg   . Nm myocar perf wall motion  03/2009    dipyridamole; normal pattern of perfusion in all regions, post-stress EF 65%, normal study     FAMHx:  Family History  Problem Relation Age of Onset  . Heart disease Mother     MI, HTN  . Hypertension Mother   . Heart disease Maternal Grandmother     MI, CVA  . Heart disease Maternal Grandfather     MI  . Stroke Maternal Grandfather   . Heart disease Paternal Grandmother   . Stroke Paternal Grandmother   . Heart disease Paternal Grandfather     MI  . Glaucoma Father   . Diabetes  Father   . Depression Daughter   . ADD / ADHD Son   . Heart attack Paternal Uncle     HTN  . Hypertension Paternal Uncle     SOCHx:   reports that she has never smoked. She has never used smokeless tobacco. She reports that she drinks about .5 - 1 ounces of alcohol per week. She reports that she does not use illicit drugs.  ALLERGIES:  Allergies  Allergen Reactions  . Botox [Botulinum Toxin Type A]   . Toprol Xl [Metoprolol Succinate] Hives    ROS: A comprehensive review of systems was negative except for: Neurological: positive for headaches and memory problems  HOME MEDS: Current Outpatient Prescriptions  Medication Sig Dispense Refill  . ALPRAZolam  (XANAX) 0.25 MG tablet Take 0.25 mg by mouth 2 (two) times daily as needed for sleep.      Marland Kitchen amLODipine (NORVASC) 10 MG tablet Take 1 tablet (10 mg total) by mouth daily.  30 tablet  5  . aspirin 81 MG tablet Take 81 mg by mouth daily.      . butalbital-acetaminophen-caffeine (FIORICET WITH CODEINE) 50-325-40-30 MG per capsule Take 1 capsule by mouth every 4 (four) hours as needed.      . desvenlafaxine (PRISTIQ) 100 MG 24 hr tablet Take 100 mg by mouth daily.      . Diclofenac Potassium (CAMBIA PO) Take 50 mg by mouth as needed.       . diclofenac sodium (VOLTAREN) 1 % GEL Apply 2 g topically 4 (four) times daily.  100 g  3  . Eszopiclone (ESZOPICLONE) 3 MG TABS Take 3 mg by mouth at bedtime. Take immediately before bedtime      . frovatriptan (FROVA) 2.5 MG tablet Take 2.5 mg by mouth as needed for migraine. If recurs, may repeat after 2 hours. Max of 3 tabs in 24 hours.      . gabapentin (NEURONTIN) 300 MG capsule Take 300 mg by mouth 3 (three) times daily.      Marland Kitchen HYDROcodone-acetaminophen (NORCO) 7.5-325 MG per tablet Take 1 tablet by mouth 2 (two) times daily as needed for pain.      Marland Kitchen ipratropium (ATROVENT) 0.03 % nasal spray Place 2 sprays into the nose 4 (four) times daily.  30 mL  1  . levothyroxine (SYNTHROID, LEVOTHROID) 100 MCG tablet Take 1 tablet (100 mcg total) by mouth daily.  90 tablet  3  . nitroGLYCERIN (NITROSTAT) 0.4 MG SL tablet Place 1 tablet (0.4 mg total) under the tongue every 5 (five) minutes as needed.  30 tablet  0  . omeprazole (PRILOSEC) 40 MG capsule Take 40 mg by mouth daily.      Marland Kitchen oxyCODONE-acetaminophen (ROXICET) 5-325 MG per tablet Take 1 tablet by mouth every 8 (eight) hours as needed for pain.  20 tablet  0  . valACYclovir (VALTREX) 500 MG tablet TAKE 1 TABLET BY MOUTH TWICE DAILY FOR 3 DAYS AS NEEDED FOR OUTBREAKS  30 tablet  2  . valsartan-hydrochlorothiazide (DIOVAN-HCT) 320-25 MG per tablet Take 1 tablet by mouth daily.  30 tablet  5   No current  facility-administered medications for this visit.    LABS/IMAGING: No results found for this or any previous visit (from the past 48 hour(s)). No results found.  VITALS: BP 112/78  Pulse 61  Ht 5\' 3"  (1.6 m)  Wt 214 lb 8 oz (97.297 kg)  BMI 38.01 kg/m2  EXAM: General appearance: alert and no distress Neck: no carotid bruit  and no JVD Lungs: clear to auscultation bilaterally Heart: regular rate and rhythm, S1, S2 normal, no murmur, click, rub or gallop Abdomen: soft, non-tender; bowel sounds normal; no masses,  no organomegaly Extremities: extremities normal, atraumatic, no cyanosis or edema Pulses: 2+ and symmetric Skin: Skin color, texture, turgor normal. No rashes or lesions Neurologic: Grossly normal  EKG: Normal sinus rhythm at 61, nonspecific T wave changes  ASSESSMENT: 1. Dyslipidemia - last LDL 130 in 01/2013, not on statin 2. Hypertension - controlled 3. Obesity 4. History of TBI with headaches and memory loss 5. Strong family history of premature coronary artery disease  PLAN: 1.   Mrs. Condie had a negative nuclear stress test which was low risk. Her chest pain is now resolved. Her blood pressure is much better controlled. She continues to have some migraine headaches secondary to traumatic brain injury. She does have a family history of coronary artery disease which is strong and therefore needs aggressive screening. Her last lipid profile was elevated a year ago. I would recommend rechecking her cholesterol this time. I continue to stress working on weight loss. Her weight is 214 today and was as low as 203. She needs to work on exercise and dietary changes. Plan to see her back annually or sooner as necessary.  Pixie Casino, MD, [facility] Attending Cardiologist [facility]  HILTY,Kenneth C 07/15/2014, 8:41 AM

## 2014-07-15 NOTE — Patient Instructions (Addendum)
Your physician recommends that you schedule a follow-up appointment in: [place] physician recommends that you return for lab work  Fasting Lipids

## 2014-07-21 ENCOUNTER — Encounter: Payer: Self-pay | Admitting: *Deleted

## 2014-08-25 ENCOUNTER — Ambulatory Visit (INDEPENDENT_AMBULATORY_CARE_PROVIDER_SITE_OTHER): Payer: BC Managed Care – PPO

## 2014-08-25 ENCOUNTER — Ambulatory Visit (INDEPENDENT_AMBULATORY_CARE_PROVIDER_SITE_OTHER): Payer: BC Managed Care – PPO | Admitting: Family Medicine

## 2014-08-25 ENCOUNTER — Encounter: Payer: Self-pay | Admitting: Family Medicine

## 2014-08-25 VITALS — BP 142/90 | HR 88 | Temp 98.0°F | Resp 16 | Ht 63.25 in | Wt 208.2 lb

## 2014-08-25 DIAGNOSIS — E039 Hypothyroidism, unspecified: Secondary | ICD-10-CM

## 2014-08-25 DIAGNOSIS — M25559 Pain in unspecified hip: Secondary | ICD-10-CM

## 2014-08-25 DIAGNOSIS — H6120 Impacted cerumen, unspecified ear: Secondary | ICD-10-CM

## 2014-08-25 DIAGNOSIS — I10 Essential (primary) hypertension: Secondary | ICD-10-CM

## 2014-08-25 DIAGNOSIS — Z23 Encounter for immunization: Secondary | ICD-10-CM

## 2014-08-25 DIAGNOSIS — M25552 Pain in left hip: Secondary | ICD-10-CM

## 2014-08-25 DIAGNOSIS — H6122 Impacted cerumen, left ear: Secondary | ICD-10-CM

## 2014-08-25 LAB — CBC
HEMATOCRIT: 36.7 % (ref 36.0–46.0)
Hemoglobin: 12.6 g/dL (ref 12.0–15.0)
MCH: 29.1 pg (ref 26.0–34.0)
MCHC: 34.3 g/dL (ref 30.0–36.0)
MCV: 84.8 fL (ref 78.0–100.0)
Platelets: 233 10*3/uL (ref 150–400)
RBC: 4.33 MIL/uL (ref 3.87–5.11)
RDW: 14 % (ref 11.5–15.5)
WBC: 4 10*3/uL (ref 4.0–10.5)

## 2014-08-25 LAB — TSH: TSH: 0.265 u[IU]/mL — ABNORMAL LOW (ref 0.350–4.500)

## 2014-08-25 LAB — COMPREHENSIVE METABOLIC PANEL
ALBUMIN: 4.1 g/dL (ref 3.5–5.2)
ALT: 14 U/L (ref 0–35)
AST: 17 U/L (ref 0–37)
Alkaline Phosphatase: 59 U/L (ref 39–117)
BUN: 10 mg/dL (ref 6–23)
CALCIUM: 9.7 mg/dL (ref 8.4–10.5)
CHLORIDE: 103 meq/L (ref 96–112)
CO2: 30 mEq/L (ref 19–32)
Creat: 0.75 mg/dL (ref 0.50–1.10)
GLUCOSE: 103 mg/dL — AB (ref 70–99)
Potassium: 3.5 mEq/L (ref 3.5–5.3)
Sodium: 140 mEq/L (ref 135–145)
Total Bilirubin: 0.4 mg/dL (ref 0.2–1.2)
Total Protein: 7 g/dL (ref 6.0–8.3)

## 2014-08-25 MED ORDER — LEVOTHYROXINE SODIUM 100 MCG PO TABS: 100.0000 ug | ORAL_TABLET | Freq: Every day | ORAL | Status: DC

## 2014-08-25 NOTE — Patient Instructions (Signed)
I will be in touch with your TSH level- wait to fill your thyroid medication until you hear from me!   You can certainly continue to use aspirin or tylenol as needed for your joints.  If your pain gets to be enough that you would consider further treatment [facility] can certainly help you.

## 2014-08-25 NOTE — Progress Notes (Addendum)
[facility] [address], [place]  [address] [phone]  Date:  08/25/2014   Name:  [name]   DOB:  1951-05-17   MRN:  [id number]  PCP:  Lamar Blinks, MD    Chief Complaint: Medication Refill and Hearing Problem   History of Present Illness:  [name] is a 63 y.o. very pleasant female patient who presents with the following:  Here today for a follow-up.  She needs her synthroid refilled.   Due for a TSH check today.  She is working on losing weight slowly.  Cholesterol check done in August and looked ok. She does have arthritis and uses some aspirin.  She hates taking medications so only takes aspirin occasionally  Her right big toe and left hip are her main issues.  There is a strong family history of hip degenerative change  She also cannot seem to hear as well as usual- wonders if her ears need irrigation.    Dr. Debara Pickett follows her BP She is not fasting today.    She is seeing Dr. Jacelyn Grip for her back at [facility] Readings from Last 3 Encounters:  08/25/14 208 lb 3.2 oz (94.439 kg)  07/15/14 214 lb 8 oz (97.297 kg)  05/15/14 213 lb (96.616 kg)     Patient Active Problem List   Diagnosis Date Noted  . TBI (traumatic brain injury) 07/15/2014  . Chest pain 12/13/2013  . Dyslipidemia 12/13/2013  . Obesity (BMI 35.0-39.9 without comorbidity) 12/13/2013  . Hypothyroidism (acquired) 01/18/2012  . Hypertension 01/18/2012  . GERD (gastroesophageal reflux disease) 01/18/2012  . Depression 01/18/2012  . Migraine syndrome 01/18/2012    Past Medical History  Diagnosis Date  . Headaches due to old head injury   . Hypertension   . Thyroid disease   . Reflux   . Arthritis   . GERD (gastroesophageal reflux disease)   . Depression   . Allergy   . Dyslipidemia   . Obesity     Past Surgical History  Procedure Laterality Date  . Facet joint injection  2011 and 2012  . Breast surgery  2003    reduction  .  Thyroidectomy  1989  . Tubal ligation    . Abdominal hysterectomy    . Myomectomy    . Cesarean section    . Transthoracic echocardiogram  03/2009    EF=>55%, borderline conc LVH; mild mitral annular calcif, trace MR; trace TR; AV mildly sclerotic, mild AV regurg; mild pulm valve regurg   . Nm myocar perf wall motion  03/2009    dipyridamole; normal pattern of perfusion in all regions, post-stress EF 65%, normal study     History  Substance Use Topics  . Smoking status: Never Smoker   . Smokeless tobacco: Never Used  . Alcohol Use: 0.5 - 1.0 oz/week    1-2 drink(s) per week    Family History  Problem Relation Age of Onset  . Heart disease Mother     MI, HTN  . Hypertension Mother   . Heart disease Maternal Grandmother     MI, CVA  . Heart disease Maternal Grandfather     MI  . Stroke Maternal Grandfather   . Heart disease Paternal Grandmother   . Stroke Paternal Grandmother   . Heart disease Paternal Grandfather     MI  . Glaucoma Father   . Diabetes Father   . Depression Daughter   . ADD / ADHD Son   .  Heart attack Paternal Uncle     HTN  . Hypertension Paternal Uncle     Allergies  Allergen Reactions  . Botox [Botulinum Toxin Type A]   . Toprol Xl [Metoprolol Succinate] Hives    Medication list has been reviewed and updated.  Current Outpatient Prescriptions on File Prior to Visit  Medication Sig Dispense Refill  . ALPRAZolam (XANAX) 0.25 MG tablet Take 0.25 mg by mouth 2 (two) times daily as needed for sleep.      Marland Kitchen amLODipine (NORVASC) 10 MG tablet Take 1 tablet (10 mg total) by mouth daily.  30 tablet  5  . aspirin 81 MG tablet Take 81 mg by mouth daily.      . butalbital-acetaminophen-caffeine (FIORICET WITH CODEINE) 50-325-40-30 MG per capsule Take 1 capsule by mouth every 4 (four) hours as needed.      . desvenlafaxine (PRISTIQ) 100 MG 24 hr tablet Take 100 mg by mouth daily.      . Diclofenac Potassium (CAMBIA PO) Take 50 mg by mouth as needed.       .  diclofenac sodium (VOLTAREN) 1 % GEL Apply 2 g topically 4 (four) times daily.  100 g  3  . Eszopiclone (ESZOPICLONE) 3 MG TABS Take 3 mg by mouth at bedtime. Take immediately before bedtime      . frovatriptan (FROVA) 2.5 MG tablet Take 2.5 mg by mouth as needed for migraine. If recurs, may repeat after 2 hours. Max of 3 tabs in 24 hours.      . gabapentin (NEURONTIN) 300 MG capsule Take 300 mg by mouth 3 (three) times daily.      Marland Kitchen HYDROcodone-acetaminophen (NORCO) 7.5-325 MG per tablet Take 1 tablet by mouth 2 (two) times daily as needed for pain.      Marland Kitchen ipratropium (ATROVENT) 0.03 % nasal spray Place 2 sprays into the nose 4 (four) times daily.  30 mL  1  . levothyroxine (SYNTHROID, LEVOTHROID) 100 MCG tablet Take 1 tablet (100 mcg total) by mouth daily.  90 tablet  3  . nitroGLYCERIN (NITROSTAT) 0.4 MG SL tablet Place 1 tablet (0.4 mg total) under the tongue every 5 (five) minutes as needed.  30 tablet  0  . omeprazole (PRILOSEC) 40 MG capsule Take 40 mg by mouth daily.      Marland Kitchen oxyCODONE-acetaminophen (ROXICET) 5-325 MG per tablet Take 1 tablet by mouth every 8 (eight) hours as needed for pain.  20 tablet  0  . valACYclovir (VALTREX) 500 MG tablet TAKE 1 TABLET BY MOUTH TWICE DAILY FOR 3 DAYS AS NEEDED FOR OUTBREAKS  30 tablet  2  . valsartan-hydrochlorothiazide (DIOVAN-HCT) 320-25 MG per tablet Take 1 tablet by mouth daily.  30 tablet  5   No current facility-administered medications on file prior to visit.    Review of Systems:  As per HPI- otherwise negative.   Physical Examination: Filed Vitals:   08/25/14 0920  BP: 162/83  Pulse: 88  Temp: 98 F (36.7 C)  Resp: 16   Filed Vitals:   08/25/14 0920  Height: 5' 3.25" (1.607 m)  Weight: 208 lb 3.2 oz (94.439 kg)   Body mass index is 36.57 kg/(m^2). Ideal Body Weight: Weight in (lb) to have BMI = 25: 142  GEN: WDWN, NAD, Non-toxic, A & O x 3, obese, looks well HEENT: Atraumatic, Normocephalic. Neck supple. No masses, No LAD.   rightTM wnl, oropharynx normal.  PEERL,EOMI.   Ears and Nose: No external deformity. CV: RRR, No M/G/R.  No JVD. No thrill. No extra heart sounds. PULM: CTA B, no wheezes, crackles, rhonchi. No retractions. No resp. distress. No accessory muscle use. EXTR: No c/c/e NEURO Normal gait.  PSYCH: Normally interactive. Conversant. Not depressed or anxious appearing.  Calm demeanor.  Irrigated left ear and removed cerumen, then normal exam of left TM Left hip: she has some discomfort with rotation of the left hip.  Normal flexion, normal strength of the leg.    [facility] reading (PRIMARY) by  Dr. Lorelei Pont Left hip: minimal degenerative change   LEFT HIP - COMPLETE 2+ VIEW  COMPARISON: 04/29/2011  FINDINGS: Question mild osseous demineralization.  Minimal narrowing of the hip joints bilaterally.  SI joints symmetric.  No acute fracture, dislocation or bone destruction.  IMPRESSION: Minimal stable narrowing of the hip joints bilaterally suggesting minimal degenerative changes.  No acute abnormalities.  Assessment and Plan: Need for prophylactic vaccination and inoculation against influenza - Plan: Flu Vaccine QUAD 36+ mos IM  Unspecified hypothyroidism - Plan: levothyroxine (SYNTHROID, LEVOTHROID) 100 MCG tablet, TSH  Left hip pain - Plan: DG Hip Complete Left, Ambulatory referral to Orthopedic Surgery  Essential hypertension - Plan: CBC, Comprehensive metabolic panel  Cerumen impaction, left  Resolved cerumen impaction and she did feel that her hearing is better She would like to have ortho evaluate her hip- will make referral for her.   Check TSH and other labs, refilled her synthroid   Signed Lamar Blinks, MD  9/30: called her to go over her labs.  Her TSH is suppressed- will decrease her dose from 100 to 88 mcg, and she will come by for a lab draw only in about 6 weeks to check her TSH.    Results for orders placed in visit on 08/25/14  TSH      Result Value Ref Range    TSH 0.265 (*) 0.350 - 4.500 uIU/mL  CBC      Result Value Ref Range   WBC 4.0  4.0 - 10.5 K/uL   RBC 4.33  3.87 - 5.11 MIL/uL   Hemoglobin 12.6  12.0 - 15.0 g/dL   HCT 36.7  36.0 - 46.0 %   MCV 84.8  78.0 - 100.0 fL   MCH 29.1  26.0 - 34.0 pg   MCHC 34.3  30.0 - 36.0 g/dL   RDW 14.0  11.5 - 15.5 %   Platelets 233  150 - 400 K/uL  COMPREHENSIVE METABOLIC PANEL      Result Value Ref Range   Sodium 140  135 - 145 mEq/L   Potassium 3.5  3.5 - 5.3 mEq/L   Chloride 103  96 - 112 mEq/L   CO2 30  19 - 32 mEq/L   Glucose, Bld 103 (*) 70 - 99 mg/dL   BUN 10  6 - 23 mg/dL   Creat 0.75  0.50 - 1.10 mg/dL   Total Bilirubin 0.4  0.2 - 1.2 mg/dL   Alkaline Phosphatase 59  39 - 117 U/L   AST 17  0 - 37 U/L   ALT 14  0 - 35 U/L   Total Protein 7.0  6.0 - 8.3 g/dL   Albumin 4.1  3.5 - 5.2 g/dL   Calcium 9.7  8.4 - 10.5 mg/dL

## 2014-08-27 MED ORDER — LEVOTHYROXINE SODIUM 88 MCG PO TABS: 88.0000 ug | ORAL_TABLET | Freq: Every day | ORAL | Status: DC

## 2014-08-27 NOTE — Addendum Note (Signed)
Addended by: Lamar Blinks C on: 08/27/2014 05:10 PM   Modules accepted: Orders, Medications

## 2014-09-01 ENCOUNTER — Encounter: Payer: Self-pay | Admitting: Family Medicine

## 2014-09-01 DIAGNOSIS — E034 Atrophy of thyroid (acquired): Secondary | ICD-10-CM

## 2014-09-01 MED ORDER — LEVOTHYROXINE SODIUM 88 MCG PO TABS: 88.0000 ug | ORAL_TABLET | Freq: Every day | ORAL | Status: DC

## 2014-09-01 NOTE — Telephone Encounter (Signed)
Hi- I sent it over again.  Not sure why they did not get it but let me know if not successful this time!

## 2014-10-13 ENCOUNTER — Telehealth: Payer: Self-pay

## 2014-10-13 DIAGNOSIS — E039 Hypothyroidism, unspecified: Secondary | ICD-10-CM

## 2014-10-13 NOTE — Telephone Encounter (Signed)
Pt is needing to talk with someone about beening referred to [facility] endocrinology  Best number [phone]

## 2014-10-13 NOTE — Telephone Encounter (Signed)
Pt states that she has discussed this with Dr. Lorelei Pont numerous times due to her thyroid malfunctioning. She would like to finally get the referral and wants to go to [place]. I have pended referral. Please advise.

## 2014-10-28 ENCOUNTER — Other Ambulatory Visit: Payer: Self-pay | Admitting: Internal Medicine

## 2014-10-28 NOTE — Telephone Encounter (Signed)
Rx was sent to pharmacy electronically. 

## 2014-10-29 ENCOUNTER — Encounter: Payer: Self-pay | Admitting: Endocrinology

## 2014-10-29 ENCOUNTER — Ambulatory Visit (INDEPENDENT_AMBULATORY_CARE_PROVIDER_SITE_OTHER): Payer: BC Managed Care – PPO | Admitting: Endocrinology

## 2014-10-29 ENCOUNTER — Other Ambulatory Visit (INDEPENDENT_AMBULATORY_CARE_PROVIDER_SITE_OTHER): Payer: BC Managed Care – PPO

## 2014-10-29 VITALS — BP 121/97 | HR 86 | Temp 98.2°F | Resp 14 | Ht 63.5 in | Wt 205.4 lb

## 2014-10-29 DIAGNOSIS — E049 Nontoxic goiter, unspecified: Secondary | ICD-10-CM

## 2014-10-29 DIAGNOSIS — E039 Hypothyroidism, unspecified: Secondary | ICD-10-CM

## 2014-10-29 DIAGNOSIS — I519 Heart disease, unspecified: Principal | ICD-10-CM

## 2014-10-29 DIAGNOSIS — E041 Nontoxic single thyroid nodule: Secondary | ICD-10-CM

## 2014-10-29 LAB — T3, FREE: T3, Free: 3.2 pg/mL (ref 2.3–4.2)

## 2014-10-29 LAB — TSH: TSH: 0.14 u[IU]/mL — ABNORMAL LOW (ref 0.35–4.50)

## 2014-10-29 LAB — T4, FREE: Free T4: 1.15 ng/dL (ref 0.60–1.60)

## 2014-10-29 NOTE — Progress Notes (Signed)
[name] 63 y.o.     Reason for Appointment: Goiter, new consultation    History of Present Illness:   The patient's thyroid enlargement was first discovered in the late 1980s when she had a thyroid nodule on the left side. She does not remember the details of this and [place] was [place] state She was told to have surgery for this because of having symptoms of pressure such as swallowing difficulties. She does not know if her whole thyroid was removed She was told to take thyroid hormone subsequently and was told to take it indefinitely Initially was started on 88 g levothyroxine  Not clear from her records if she has had hypothyroidism, has not had any high TSH levels documented Her dosage has been adjusted periodically by her PCP and has been reduced in 9/15 when TSH was slightly low  She has now come in for a consultation because of her feeling a swelling in her left lower neck for about a year which is gradually increasing in size.  She has not had any local discomfort She has had no difficulty with swallowing   Does not feel like she has any choking sensation in her neck or pressure in any position or when lying down.  She does complain of feeling fatigued but this has been going on for some time and is not any worse recently No cold intolerance or recent weight change She is taking her thyroid supplement regularly in the mornings  Lab Results  Component Value Date   TSH 0.265* 08/25/2014   TSH 0.431 10/08/2013   TSH 0.533 01/31/2013        Medication List       This list is accurate as of: 10/29/14  9:43 AM.  Always use your most recent med list.               ALPRAZolam 0.25 MG tablet  Commonly known as:  XANAX  Take 0.25 mg by mouth 2 (two) times daily as needed for sleep.     amLODipine 10 MG tablet  Commonly known as:  NORVASC  Take 1 tablet (10 mg total) by mouth daily.     aspirin 81 MG tablet  Take 81 mg by mouth  daily.     butalbital-acetaminophen-caffeine 50-325-40-30 MG per capsule  Commonly known as:  FIORICET WITH CODEINE  Take 1 capsule by mouth every 4 (four) hours as needed.     CAMBIA PO  Take 50 mg by mouth as needed.     desvenlafaxine 100 MG 24 hr tablet  Commonly known as:  PRISTIQ  Take 100 mg by mouth daily.     diclofenac sodium 1 % Gel  Commonly known as:  VOLTAREN  Apply 2 g topically 4 (four) times daily.     eszopiclone 3 MG Tabs  Generic drug:  Eszopiclone  Take 3 mg by mouth at bedtime. Take immediately before bedtime     frovatriptan 2.5 MG tablet  Commonly known as:  FROVA  Take 2.5 mg by mouth as needed for migraine. If recurs, may repeat after 2 hours. Max of 3 tabs in 24 hours.     gabapentin 300 MG capsule  Commonly known as:  NEURONTIN  Take 300 mg by mouth 3 (three) times daily.     HYDROcodone-acetaminophen 7.5-325 MG per tablet  Commonly known as:  NORCO  Take 1 tablet by mouth 2 (two) times daily as needed for pain.  ipratropium 0.03 % nasal spray  Commonly known as:  ATROVENT  Place 2 sprays into the nose 4 (four) times daily.     levothyroxine 88 MCG tablet  Commonly known as:  SYNTHROID, LEVOTHROID  Take 1 tablet (88 mcg total) by mouth daily.     nitroGLYCERIN 0.4 MG SL tablet  Commonly known as:  NITROSTAT  Place 1 tablet (0.4 mg total) under the tongue every 5 (five) minutes as needed.     omeprazole 40 MG capsule  Commonly known as:  PRILOSEC  Take 40 mg by mouth daily.     oxyCODONE-acetaminophen 5-325 MG per tablet  Commonly known as:  ROXICET  Take 1 tablet by mouth every 8 (eight) hours as needed for pain.     valACYclovir 500 MG tablet  Commonly known as:  VALTREX  TAKE 1 TABLET BY MOUTH TWICE DAILY FOR 3 DAYS AS NEEDED FOR OUTBREAKS     valsartan-hydrochlorothiazide 320-25 MG per tablet  Commonly known as:  DIOVAN-HCT  TAKE 1 TABLET BY MOUTH EVERY DAY        Allergies:  Allergies  Allergen Reactions  . Botox  [Botulinum Toxin Type A]   . Toprol Xl [Metoprolol Succinate] Hives    Past Medical History  Diagnosis Date  . Headaches due to old head injury   . Hypertension   . Thyroid disease   . Reflux   . Arthritis   . GERD (gastroesophageal reflux disease)   . Depression   . Allergy   . Dyslipidemia   . Obesity     Past Surgical History  Procedure Laterality Date  . Facet joint injection  2011 and 2012  . Breast surgery  2003    reduction  . Thyroidectomy  1989  . Tubal ligation    . Abdominal hysterectomy    . Myomectomy    . Cesarean section    . Transthoracic echocardiogram  03/2009    EF=>55%, borderline conc LVH; mild mitral annular calcif, trace MR; trace TR; AV mildly sclerotic, mild AV regurg; mild pulm valve regurg   . Nm myocar perf wall motion  03/2009    dipyridamole; normal pattern of perfusion in all regions, post-stress EF 65%, normal study     Family History  Problem Relation Age of Onset  . Heart disease Mother     MI, HTN  . Hypertension Mother   . Heart disease Maternal Grandmother     MI, CVA  . Heart disease Maternal Grandfather     MI  . Stroke Maternal Grandfather   . Heart disease Paternal Grandmother   . Stroke Paternal Grandmother   . Heart disease Paternal Grandfather     MI  . Glaucoma Father   . Diabetes Father   . Depression Daughter   . ADD / ADHD Son   . Heart attack Paternal Uncle     HTN  . Hypertension Paternal Uncle     Social History:  reports that she has never smoked. She has never used smokeless tobacco. She reports that she drinks about 0.5 - 1.0 oz of alcohol per week. She reports that she does not use illicit drugs.   Review of Systems:  She has headaches after her injury ? Migraines No history of coronary artery disease She is on multiple medications for hypertension There is a history of depression and anxiety and is on multiple medications No recent change in bowel habits  She is not taking any lipid-lowering drugs  No  history of Diabetes.             Examination:   BP 121/97 mmHg  Pulse 86  Temp(Src) 98.2 F (36.8 C)  Resp 14  Ht 5' 3.5" (1.613 m)  Wt 205 lb 6.4 oz (93.169 kg)  BMI 35.81 kg/m2  SpO2 97%   General Appearance: pleasant, well-built and nourished, mild generalized obesity present          Eyes: No abnormal prominence or swelling.          Neck: The thyroid is enlarged mostly on the left side, firm, rubbery and smooth.  Left lobe is enlarged about 2-1/2-3 times normal and extends to the isthmus to a smaller extent and slightly to the right medial lobe.  No discrete nodule is felt There is no stridor. Pemberton sign is negative There is no cervical lymphadenopathy.     Cardiovascular: Normal  heart sounds, no murmur Respiratory:  Lungs clear Abdomen: No hepatosplenomegaly Neurological: REFLEXES: at biceps and ankles are difficult to elicit Skin: no rash      Extremities: No edema   Assessment/Plan:  GOITER: Long-standing history of goiter treated previously with at least partial thyroidectomy Also has a strong family history of goiter She has a recurrence of her left-sided goiter which is moderately increased in size and diffuse. She does not have any symptoms of local pressure on her trachea or esophagus  Discussed with the patient that she will need an ultrasound to evaluate the nature of her thyroid enlargement and consider a needle aspiration if she has a dominant nodule   ?  HYPOTHYROIDISM Although she has been on levothyroxine long-term it is unclear whether she is taking this for supplementation or suppression since her surgery on her thyroid nodule/goiter Since her TSH level has been generally low normal she may still have normal functioning thyroid tissue and may not need thyroid supplementation.  Her levothyroxine has not been beneficial in reducing recurrence of her goiter Will also check her TSH again today as well as TPO antibodies to rule out  Hashimoto's thyroiditis   [facility] 10/29/2014

## 2014-10-30 LAB — THYROID PEROXIDASE ANTIBODY: Thyroid Peroxidase Ab: 8 IU/mL (ref 0–34)

## 2014-11-03 ENCOUNTER — Telehealth: Payer: Self-pay | Admitting: *Deleted

## 2014-11-03 NOTE — Telephone Encounter (Signed)
Received prior authorization for valsartan-hctz 320/25mg  from [employer]. Called [place] number [id number] ([employer]) which is the 3rd party vendor for worker's comp. They report that patient's med was denied by the "adjuster" and patient would need to contact the "adjuster". Spoke with patient about this and she reports she pays for this medication and it is to be processed by her [employer] and NOT worker's comp. She states she has this issue every few months when she requests refill. Patient has medication and states she will straighten this out with pharmacy.

## 2014-11-04 ENCOUNTER — Ambulatory Visit
Admission: RE | Admit: 2014-11-04 | Discharge: 2014-11-04 | Disposition: A | Discharge status: Home / Self Care | Payer: BC Managed Care – PPO | Source: Ambulatory Visit | Attending: Endocrinology | Admitting: Endocrinology

## 2014-11-04 DIAGNOSIS — E049 Nontoxic goiter, unspecified: Secondary | ICD-10-CM

## 2014-11-10 ENCOUNTER — Other Ambulatory Visit: Payer: Self-pay | Admitting: Endocrinology

## 2014-11-10 DIAGNOSIS — E041 Nontoxic single thyroid nodule: Secondary | ICD-10-CM | POA: Insufficient documentation

## 2014-12-04 ENCOUNTER — Telehealth: Payer: Self-pay | Admitting: Endocrinology

## 2014-12-04 NOTE — Telephone Encounter (Signed)
Patient states that Dr. Dwyane Dee was to refer her out for thyroid nodules; she has not heard of anything yet    Please advise   Thank you

## 2014-12-10 ENCOUNTER — Ambulatory Visit (INDEPENDENT_AMBULATORY_CARE_PROVIDER_SITE_OTHER): Payer: BC Managed Care – PPO | Admitting: Family Medicine

## 2014-12-10 VITALS — BP 152/80 | HR 64 | Temp 97.8°F | Resp 16 | Ht 64.0 in | Wt 213.6 lb

## 2014-12-10 DIAGNOSIS — M10041 Idiopathic gout, right hand: Secondary | ICD-10-CM

## 2014-12-10 MED ORDER — COLCHICINE 0.6 MG PO TABS: ORAL_TABLET | ORAL | Status: DC

## 2014-12-10 NOTE — Progress Notes (Signed)
[facility] [address] [place] [address] [phone]  Date:  12/10/2014   Name:  [name]   DOB:  [birth year]   MRN:  [id number]  PCP:  Lamar Blinks, MD    Chief Complaint: Gout and Hand Pain   History of Present Illness:  [name] is a 64 y.o. very pleasant female patient who presents with the following:  Here today with possible gout in her hand.  Dr. Dwyane Dee is handling her thyroid issues at this point.  She notes pain in her right hand that started a week ago.  There was no injury, it was there when she woke up one morning.  She has had gout in her knee and big toe and this seems like the same thing.  She tried some voltaren gel which did help.   She has a bottle of colchicine from 2009 but she did not try taking it yet because it was so old.   She also tried some aleve.  She is right handed  She did not take her BP medication yet today  Patient Active Problem List   Diagnosis Date Noted  . Left thyroid nodule 11/10/2014  . TBI (traumatic brain injury) 07/15/2014  . Chest pain 12/13/2013  . Dyslipidemia 12/13/2013  . Obesity (BMI 35.0-39.9 without comorbidity) 12/13/2013  . Hypothyroidism (acquired) 01/18/2012  . Hypertension 01/18/2012  . GERD (gastroesophageal reflux disease) 01/18/2012  . Depression 01/18/2012  . Migraine syndrome 01/18/2012    Past Medical History  Diagnosis Date  . Headaches due to old head injury   . Hypertension   . Thyroid disease   . Reflux   . Arthritis   . GERD (gastroesophageal reflux disease)   . Depression   . Allergy   . Dyslipidemia   . Obesity     Past Surgical History  Procedure Laterality Date  . Facet joint injection  2011 and 2012  . Breast surgery  2003    reduction  . Thyroidectomy  1989  . Tubal ligation    . Abdominal hysterectomy    . Myomectomy    . Cesarean section    . Transthoracic echocardiogram  03/2009    EF=>55%, borderline conc LVH; mild mitral  annular calcif, trace MR; trace TR; AV mildly sclerotic, mild AV regurg; mild pulm valve regurg   . Nm myocar perf wall motion  03/2009    dipyridamole; normal pattern of perfusion in all regions, post-stress EF 65%, normal study     History  Substance Use Topics  . Smoking status: Never Smoker   . Smokeless tobacco: Never Used  . Alcohol Use: 0.5 - 1.0 oz/week    1-2 Not specified per week    Family History  Problem Relation Age of Onset  . Heart disease Mother     MI, HTN  . Hypertension Mother   . Thyroid disease Mother   . Heart disease Maternal Grandmother     MI, CVA  . Heart disease Maternal Grandfather     MI  . Stroke Maternal Grandfather   . Heart disease Paternal Grandmother   . Stroke Paternal Grandmother   . Heart disease Paternal Grandfather     MI  . Glaucoma Father   . Diabetes Father   . Depression Daughter   . ADD / ADHD Son   . Heart attack Paternal Uncle     HTN  . Hypertension Paternal Uncle   . Thyroid disease Sister  Allergies  Allergen Reactions  . Botox [Botulinum Toxin Type A]   . Toprol Xl [Metoprolol Succinate] Hives    Medication list has been reviewed and updated.  Current Outpatient Prescriptions on File Prior to Visit  Medication Sig Dispense Refill  . ALPRAZolam (XANAX) 0.25 MG tablet Take 0.25 mg by mouth 2 (two) times daily as needed for sleep.    Marland Kitchen amLODipine (NORVASC) 10 MG tablet Take 1 tablet (10 mg total) by mouth daily. 30 tablet 5  . aspirin 81 MG tablet Take 81 mg by mouth daily.    . butalbital-acetaminophen-caffeine (FIORICET WITH CODEINE) 50-325-40-30 MG per capsule Take 1 capsule by mouth every 4 (four) hours as needed.    . desvenlafaxine (PRISTIQ) 100 MG 24 hr tablet Take 100 mg by mouth daily.    . Diclofenac Potassium (CAMBIA PO) Take 50 mg by mouth as needed.     . diclofenac sodium (VOLTAREN) 1 % GEL Apply 2 g topically 4 (four) times daily. 100 g 3  . Eszopiclone (ESZOPICLONE) 3 MG TABS Take 3 mg by mouth  at bedtime. Take immediately before bedtime    . frovatriptan (FROVA) 2.5 MG tablet Take 2.5 mg by mouth as needed for migraine. If recurs, may repeat after 2 hours. Max of 3 tabs in 24 hours.    . gabapentin (NEURONTIN) 300 MG capsule Take 300 mg by mouth 3 (three) times daily.    Marland Kitchen HYDROcodone-acetaminophen (NORCO) 7.5-325 MG per tablet Take 1 tablet by mouth 2 (two) times daily as needed for pain.    Marland Kitchen ipratropium (ATROVENT) 0.03 % nasal spray Place 2 sprays into the nose 4 (four) times daily. 30 mL 1  . nitroGLYCERIN (NITROSTAT) 0.4 MG SL tablet Place 1 tablet (0.4 mg total) under the tongue every 5 (five) minutes as needed. 30 tablet 0  . omeprazole (PRILOSEC) 40 MG capsule Take 40 mg by mouth daily.    Marland Kitchen oxyCODONE-acetaminophen (ROXICET) 5-325 MG per tablet Take 1 tablet by mouth every 8 (eight) hours as needed for pain. 20 tablet 0  . valACYclovir (VALTREX) 500 MG tablet TAKE 1 TABLET BY MOUTH TWICE DAILY FOR 3 DAYS AS NEEDED FOR OUTBREAKS 30 tablet 2  . valsartan-hydrochlorothiazide (DIOVAN-HCT) 320-25 MG per tablet TAKE 1 TABLET BY MOUTH EVERY DAY 30 tablet 8  . levothyroxine (SYNTHROID, LEVOTHROID) 88 MCG tablet Take 1 tablet (88 mcg total) by mouth daily. (Patient not taking: Reported on 12/10/2014) 90 tablet 3   No current facility-administered medications on file prior to visit.    Review of Systems:  As per HPI- otherwise negative.   Physical Examination: Filed Vitals:   12/10/14 0844  BP: 160/70  Pulse: 64  Temp: 97.8 F (36.6 C)  Resp: 16   Filed Vitals:   12/10/14 0844  Height: 5\' 4"  (1.626 m)  Weight: 213 lb 9.6 oz (96.888 kg)   Body mass index is 36.65 kg/(m^2). Ideal Body Weight: Weight in (lb) to have BMI = 25: 145.3  GEN: WDWN, NAD, Non-toxic, A & O x 3, overweight, looks well HEENT: Atraumatic, Normocephalic. Neck supple. No masses, No LAD. Ears and Nose: No external deformity. CV: RRR, No M/G/R. No JVD. No thrill. No extra heart sounds. PULM: CTA B, no  wheezes, crackles, rhonchi. No retractions. No resp. distress. No accessory muscle use. EXTR: No c/c/e NEURO Normal gait.  PSYCH: Normally interactive. Conversant. Not depressed or anxious appearing.  Calm demeanor.  Right hand: tenderness and mild swelling over the 3rd MCP.  She  states this area was more swollen, red and hot yesterday but this has improved.  She has discomfort with flexion of the finger but full ROM.  NV intact  Assessment and Plan: Acute idiopathic gout of right hand - Plan: colchicine 0.6 MG tablet  Likely gout of right hand.  No injury so did not perform x-rays today.  She has had good results with colchicine in the past- she will try this again and will let me know if not better in the next couple of days.  Ok to continue OTC NSAID medications, plan general recheck in 3 months.    Signed Lamar Blinks, MD

## 2014-12-10 NOTE — Patient Instructions (Signed)
We are going to treat you for gout with colchicine, please let me know if you are not better in the next couple of days.

## 2014-12-16 ENCOUNTER — Encounter (HOSPITAL_COMMUNITY)
Admission: RE | Admit: 2014-12-16 | Discharge: 2014-12-16 | Disposition: A | Discharge status: Home / Self Care | Payer: BC Managed Care – PPO | Source: Ambulatory Visit | Attending: Endocrinology | Admitting: Endocrinology

## 2014-12-16 DIAGNOSIS — E041 Nontoxic single thyroid nodule: Secondary | ICD-10-CM | POA: Insufficient documentation

## 2014-12-16 MED ORDER — SODIUM IODIDE I 131 CAPSULE
14.1000 | Freq: Once | INTRAVENOUS | Status: AC | PRN
  Administered 2014-12-16: 14.1 via ORAL

## 2014-12-17 ENCOUNTER — Encounter (HOSPITAL_COMMUNITY)
Admission: RE | Admit: 2014-12-17 | Discharge: 2014-12-17 | Disposition: A | Discharge status: Home / Self Care | Payer: BC Managed Care – PPO | Source: Ambulatory Visit | Attending: Endocrinology | Admitting: Endocrinology

## 2014-12-17 DIAGNOSIS — E041 Nontoxic single thyroid nodule: Secondary | ICD-10-CM | POA: Diagnosis not present

## 2014-12-17 MED ORDER — SODIUM PERTECHNETATE TC 99M INJECTION
10.0000 | Freq: Once | INTRAVENOUS | Status: AC | PRN
  Administered 2014-12-17: 10 via INTRAVENOUS

## 2014-12-19 ENCOUNTER — Other Ambulatory Visit: Payer: Self-pay | Admitting: Endocrinology

## 2014-12-19 DIAGNOSIS — E041 Nontoxic single thyroid nodule: Secondary | ICD-10-CM

## 2014-12-25 ENCOUNTER — Encounter: Payer: Self-pay | Admitting: Family Medicine

## 2014-12-26 ENCOUNTER — Encounter: Payer: Self-pay | Admitting: Family Medicine

## 2014-12-29 ENCOUNTER — Other Ambulatory Visit (INDEPENDENT_AMBULATORY_CARE_PROVIDER_SITE_OTHER): Payer: BC Managed Care – PPO

## 2014-12-29 DIAGNOSIS — E041 Nontoxic single thyroid nodule: Secondary | ICD-10-CM

## 2014-12-29 LAB — T3, FREE: T3, Free: 3.2 pg/mL (ref 2.3–4.2)

## 2014-12-29 LAB — TSH: TSH: 0.7 u[IU]/mL (ref 0.35–4.50)

## 2014-12-29 LAB — T4, FREE: FREE T4: 0.85 ng/dL (ref 0.60–1.60)

## 2015-01-01 ENCOUNTER — Encounter: Payer: Self-pay | Admitting: Endocrinology

## 2015-01-01 ENCOUNTER — Ambulatory Visit (INDEPENDENT_AMBULATORY_CARE_PROVIDER_SITE_OTHER): Payer: BC Managed Care – PPO | Admitting: Endocrinology

## 2015-01-01 VITALS — BP 161/95 | HR 64 | Temp 98.0°F | Resp 14 | Ht 64.0 in | Wt 210.2 lb

## 2015-01-01 DIAGNOSIS — E041 Nontoxic single thyroid nodule: Secondary | ICD-10-CM

## 2015-01-01 NOTE — Progress Notes (Signed)
[name] 63 y.o.     Reason for Appointment: Goiter, follow-up visit     History of Present Illness:   Prior history: The patient's thyroid enlargement was first discovered in the late 1980s when she had a thyroid nodule on the left side. She does not remember the details of this and this was evaluated in another state She was told to have surgery for this because of having symptoms of pressure such as swallowing difficulties. She does not know if her whole thyroid was removed She was told to take thyroid hormone subsequently and was told to take it indefinitely Initially was started on 88 g levothyroxine but this was subsequently reduced progressively down to 25 g  Recent history:  Because of her TSH being persistently low her levothyroxine was stopped and she is not taking any now.  She does feel better overall with her energy level  She was evaluated for the swelling of her neck that she had been noticing for about a year which was gradually increasing in size. She has not had any local discomfort She has had no difficulty with swallowing   Does not feel like she has any choking sensation in her neck or pressure in any position   Her ultrasound showed left thyroid enlargement with a complex nodule 3.4 cm in size Thyroid scan showed heterogenous uptake with areas of increased and decreased uptake and no cold nodule Uptake was slightly above the 30% upper normal limit  Lab Results  Component Value Date   TSH 0.70 12/29/2014   TSH 0.14* 10/29/2014   TSH 0.265* 08/25/2014   FREET4 0.85 12/29/2014   FREET4 1.15 10/29/2014         Medication List       This list is accurate as of: 01/01/15 10:29 AM.  Always use your most recent med list.               ALPRAZolam 0.25 MG tablet  Commonly known as:  XANAX  Take 0.25 mg by mouth 2 (two) times daily as needed for sleep.     amLODipine 10 MG tablet  Commonly known as:  NORVASC  Take 1 tablet  (10 mg total) by mouth daily.     aspirin 81 MG tablet  Take 81 mg by mouth daily.     butalbital-acetaminophen-caffeine 50-325-40-30 MG per capsule  Commonly known as:  FIORICET WITH CODEINE  Take 1 capsule by mouth every 4 (four) hours as needed.     CAMBIA PO  Take 50 mg by mouth as needed.     colchicine 0.6 MG tablet  Take 2 pills, then repeat one pill an hour later     desvenlafaxine 100 MG 24 hr tablet  Commonly known as:  PRISTIQ  Take 100 mg by mouth daily.     diclofenac sodium 1 % Gel  Commonly known as:  VOLTAREN  Apply 2 g topically 4 (four) times daily.     eszopiclone 3 MG Tabs  Generic drug:  Eszopiclone  Take 3 mg by mouth at bedtime. Take immediately before bedtime     frovatriptan 2.5 MG tablet  Commonly known as:  FROVA  Take 2.5 mg by mouth as needed for migraine. If recurs, may repeat after 2 hours. Max of 3 tabs in 24 hours.     gabapentin 300 MG capsule  Commonly known as:  NEURONTIN  Take 300 mg by mouth 3 (three) times daily.     HYDROcodone-acetaminophen  7.5-325 MG per tablet  Commonly known as:  NORCO  Take 1 tablet by mouth 2 (two) times daily as needed for pain.     ipratropium 0.03 % nasal spray  Commonly known as:  ATROVENT  Place 2 sprays into the nose 4 (four) times daily.     nitroGLYCERIN 0.4 MG SL tablet  Commonly known as:  NITROSTAT  Place 1 tablet (0.4 mg total) under the tongue every 5 (five) minutes as needed.     omeprazole 40 MG capsule  Commonly known as:  PRILOSEC  Take 40 mg by mouth daily.     oxyCODONE-acetaminophen 5-325 MG per tablet  Commonly known as:  ROXICET  Take 1 tablet by mouth every 8 (eight) hours as needed for pain.     valACYclovir 500 MG tablet  Commonly known as:  VALTREX  TAKE 1 TABLET BY MOUTH TWICE DAILY FOR 3 DAYS AS NEEDED FOR OUTBREAKS     valsartan-hydrochlorothiazide 320-25 MG per tablet  Commonly known as:  DIOVAN-HCT  TAKE 1 TABLET BY MOUTH EVERY DAY        Allergies:    Allergies  Allergen Reactions  . Botox [Botulinum Toxin Type A]   . Toprol Xl [Metoprolol Succinate] Hives    Past Medical History  Diagnosis Date  . Headaches due to old head injury   . Hypertension   . Thyroid disease   . Reflux   . Arthritis   . GERD (gastroesophageal reflux disease)   . Depression   . Allergy   . Dyslipidemia   . Obesity     Past Surgical History  Procedure Laterality Date  . Facet joint injection  2011 and 2012  . Breast surgery  2003    reduction  . Thyroidectomy  1989  . Tubal ligation    . Abdominal hysterectomy    . Myomectomy    . Cesarean section    . Transthoracic echocardiogram  03/2009    EF=>55%, borderline conc LVH; mild mitral annular calcif, trace MR; trace TR; AV mildly sclerotic, mild AV regurg; mild pulm valve regurg   . Nm myocar perf wall motion  03/2009    dipyridamole; normal pattern of perfusion in all regions, post-stress EF 65%, normal study     Family History  Problem Relation Age of Onset  . Heart disease Mother     MI, HTN  . Hypertension Mother   . Thyroid disease Mother   . Heart disease Maternal Grandmother     MI, CVA  . Heart disease Maternal Grandfather     MI  . Stroke Maternal Grandfather   . Heart disease Paternal Grandmother   . Stroke Paternal Grandmother   . Heart disease Paternal Grandfather     MI  . Glaucoma Father   . Diabetes Father   . Depression Daughter   . ADD / ADHD Son   . Heart attack Paternal Uncle     HTN  . Hypertension Paternal Uncle   . Thyroid disease Sister     Social History:  reports that she has never smoked. She has never used smokeless tobacco. She reports that she drinks about 0.5 - 1.0 oz of alcohol per week. She reports that she does not use illicit drugs.   Review of Systems:   She is on multiple medications for hypertension, blood pressure is high which she thinks is from rushing and not taking her morning medication         Examination:   BP  161/95 mmHg   Pulse 64  Temp(Src) 98 F (36.7 C)  Resp 14  Ht 5\' 4"  (1.626 m)  Wt 210 lb 3.2 oz (95.346 kg)  BMI 36.06 kg/m2  SpO2 98%   General Appearance: pleasant, well-built and nourished, mild generalized obesity present          Eyes: No abnormal prominence or swelling.           Neck: The thyroid is palpable on the left side, firm and smooth.  Left lobe is enlarged about 3 times normal and extends to the isthmus to a smaller extent and slightly to the right medial lobe.  Feels firmer on the superior margin and extends inferiorly to the suprasternal notch.  No discrete nodule is felt   Assessment/Plan:  GOITER: Long-standing history of goiter treated previously with thyroidectomy She has a recurrence of her left-sided goiter which is moderately increased in size and appears to contain a heterogeneously active large nodule.. She does not have any symptoms of local pressure on her trachea or esophagus Since she does not have a cold area in the thyroid nodule she does not need a biopsy.  Most likely this is a recurrence of her previous goiter Also appears to have partial autonomy of the thyroid since her TSH was low even with 25 g of levothyroxine.  Thyroid function is now normal with out any thyroid supplement and she feels fairly good  She will come back in 6 months and will repeat her ultrasound at that time   [place] 01/01/2015

## 2015-01-26 ENCOUNTER — Telehealth: Payer: Self-pay | Admitting: Pharmacist Clinician (PhC)/ Clinical Pharmacy Specialist

## 2015-01-26 MED ORDER — VALSARTAN 320 MG PO TABS: 320.0000 mg | ORAL_TABLET | Freq: Every day | ORAL | Status: DC

## 2015-01-26 NOTE — Telephone Encounter (Signed)
Pt called, states that BP good since started valsartan hctz last summer (320/25), but having more episodes of gout and with increasing frequency.  Will stop hctz and call in rx for just valsartan 320.  Scheduled pt for follow up in 1 month.

## 2015-03-02 ENCOUNTER — Ambulatory Visit (INDEPENDENT_AMBULATORY_CARE_PROVIDER_SITE_OTHER): Payer: BC Managed Care – PPO | Admitting: Pharmacist Clinician (PhC)/ Clinical Pharmacy Specialist

## 2015-03-02 VITALS — BP 158/80 | HR 76 | Ht 64.0 in | Wt 214.4 lb

## 2015-03-02 DIAGNOSIS — I10 Essential (primary) hypertension: Secondary | ICD-10-CM | POA: Diagnosis not present

## 2015-03-02 NOTE — Patient Instructions (Signed)
Your blood pressure today is  158/80  Check your blood pressure at home several times each week and keep record of the readings.  Take your BP meds as follows: no changes  Bring all of your meds, your BP cuff and your record of home blood pressures to your next appointment.  Exercise as you're able, try to walk approximately 30 minutes per day.  Keep salt intake to a minimum, especially watch canned and prepared boxed foods.  Eat more fresh fruits and vegetables and fewer canned items.  Avoid eating in fast food restaurants.    HOW TO TAKE YOUR BLOOD PRESSURE: . Rest 5 minutes before taking your blood pressure. .  Don't smoke or drink caffeinated beverages for at least 30 minutes before. . Take your blood pressure before (not after) you eat. . Sit comfortably with your back supported and both feet on the floor (don't cross your legs). . Elevate your arm to heart level on a table or a desk. . Use the proper sized cuff. It should fit smoothly and snugly around your bare upper arm. There should be enough room to slip a fingertip under the cuff. The bottom edge of the cuff should be 1 inch above the crease of the elbow. . Ideally, take 3 measurements at one sitting and record the average.

## 2015-03-05 ENCOUNTER — Encounter: Payer: Self-pay | Admitting: Pharmacist Clinician (PhC)/ Clinical Pharmacy Specialist

## 2015-03-05 NOTE — Assessment & Plan Note (Signed)
Pt has slightly elevated BP in the office today, however her home readings have all been much better.  She has just recently joined a gym and would like to work on lifestyle changes to keep her BP in check.  I encouraged her to increase her gym time to 3-4 days per week as well as to continue working on weight loss and healthy eating.  She will continue to monitor her BP at home on a regular basis and should she see it trend to >150 at least 1/2 of the time, she is to call the office for follow up.  She is due for follow up with Dr. Debara Pickett in the summer.  I can continue to see her afterward, should her BP not be well controlled at that time.

## 2015-03-05 NOTE — Progress Notes (Signed)
03/05/2015 [name] 05/16/1951 [id number]   HPI:  [name] is a 64 y.o. female patient of Dr Debara Pickett, with a PMH below who presents today for hypertension clinic evaluation.  She was previously on valsartan/hctz 320/25, however the HCTZ was triggering gout flares.  At the end of February we discontinued the HCTZ portion and left her on just valsartan 320 in addition to the amlodipine she was already taking  Cardiac Hx: has had hypertension for >20 years, also suffers from migranes  Family Hx: MI in mother, 3 grandparents, paternal uncle  Social Hx: rare alcohol, no tobacco, minimal caffeine  Diet: eats most meals at home, does not add salt  Exercise: recently joined gym, has been going twice weekly; 35-45 minutes on treadmill, some weights (trying to decrease "arm flab")  Home BP readings: only 2 readings in past month were >563 systolic, majority were 149-702O.    Current antihypertensive medications: valsartan 320 mg qd, amlodipine 10 mg qd   Current Outpatient Prescriptions  Medication Sig Dispense Refill  . ALPRAZolam (XANAX) 0.25 MG tablet Take 0.25 mg by mouth 2 (two) times daily as needed for sleep.    Marland Kitchen amLODipine (NORVASC) 10 MG tablet Take 1 tablet (10 mg total) by mouth daily. 30 tablet 5  . aspirin 81 MG tablet Take 81 mg by mouth daily.    . butalbital-acetaminophen-caffeine (FIORICET WITH CODEINE) 50-325-40-30 MG per capsule Take 1 capsule by mouth every 4 (four) hours as needed.    . colchicine 0.6 MG tablet Take 2 pills, then repeat one pill an hour later 30 tablet 0  . desvenlafaxine (PRISTIQ) 100 MG 24 hr tablet Take 100 mg by mouth daily.    . Diclofenac Potassium (CAMBIA PO) Take 50 mg by mouth as needed.     . diclofenac sodium (VOLTAREN) 1 % GEL Apply 2 g topically 4 (four) times daily. 100 g 3  . Eszopiclone (ESZOPICLONE) 3 MG TABS Take 3 mg by mouth at bedtime. Take immediately before bedtime    . frovatriptan (FROVA) 2.5 MG  tablet Take 2.5 mg by mouth as needed for migraine. If recurs, may repeat after 2 hours. Max of 3 tabs in 24 hours.    . gabapentin (NEURONTIN) 300 MG capsule Take 300 mg by mouth 3 (three) times daily.    Marland Kitchen HYDROcodone-acetaminophen (NORCO) 7.5-325 MG per tablet Take 1 tablet by mouth 2 (two) times daily as needed for pain.    Marland Kitchen ipratropium (ATROVENT) 0.03 % nasal spray Place 2 sprays into the nose 4 (four) times daily. 30 mL 1  . nitroGLYCERIN (NITROSTAT) 0.4 MG SL tablet Place 1 tablet (0.4 mg total) under the tongue every 5 (five) minutes as needed. 30 tablet 0  . omeprazole (PRILOSEC) 40 MG capsule Take 40 mg by mouth daily.    Marland Kitchen oxyCODONE-acetaminophen (ROXICET) 5-325 MG per tablet Take 1 tablet by mouth every 8 (eight) hours as needed for pain. (Patient not taking: Reported on 01/01/2015) 20 tablet 0  . valACYclovir (VALTREX) 500 MG tablet TAKE 1 TABLET BY MOUTH TWICE DAILY FOR 3 DAYS AS NEEDED FOR OUTBREAKS 30 tablet 2  . valsartan (DIOVAN) 320 MG tablet Take 1 tablet (320 mg total) by mouth daily. 30 tablet 3   No current facility-administered medications for this visit.    Allergies  Allergen Reactions  . Botox [Botulinum Toxin Type A]   . Toprol Xl [Metoprolol Succinate] Hives    Past Medical History  Diagnosis Date  .  Headaches due to old head injury   . Hypertension   . Thyroid disease   . Reflux   . Arthritis   . GERD (gastroesophageal reflux disease)   . Depression   . Allergy   . Dyslipidemia   . Obesity     Blood pressure 158/80, pulse 76, height 5\' 4"  (1.626 m), weight 214 lb 6.4 oz (97.251 kg).    [name] PharmD CPP [facility]

## 2015-03-25 ENCOUNTER — Other Ambulatory Visit: Payer: Self-pay | Admitting: Family Medicine

## 2015-04-14 ENCOUNTER — Other Ambulatory Visit: Payer: Self-pay | Admitting: Internal Medicine

## 2015-04-14 NOTE — Telephone Encounter (Signed)
Rx has been sent to the pharmacy electronically. ° °

## 2015-04-22 ENCOUNTER — Telehealth: Payer: Self-pay

## 2015-04-22 NOTE — Telephone Encounter (Signed)
Patient is calling because she asked pharmacy for refill and they told her no. Patient is requesting a new prescription for valtrex sent to [facility] on [address]. Patient phone: [phone]

## 2015-04-23 NOTE — Telephone Encounter (Signed)
Called [facility], pt has a Rx on file and is ready to refill. Left detailed message letting pt know.

## 2015-05-15 ENCOUNTER — Telehealth: Payer: Self-pay | Admitting: Internal Medicine

## 2015-05-15 MED ORDER — CARVEDILOL 3.125 MG PO TABS: 3.1250 mg | ORAL_TABLET | Freq: Two times a day (BID) | ORAL | Status: DC

## 2015-05-15 MED ORDER — VALSARTAN 320 MG PO TABS: 160.0000 mg | ORAL_TABLET | Freq: Every day | ORAL | Status: DC

## 2015-05-15 NOTE — Telephone Encounter (Signed)
Spoke with pt, aware of dr Doug Sou recommendations. Patient voiced understanding of medications change. Follow up scheduled next week with the pharm md to help with the replacement of the valsartan.

## 2015-05-15 NOTE — Addendum Note (Signed)
Addended by: Cristopher Estimable on: 05/15/2015 01:48 PM   Modules accepted: Orders

## 2015-05-15 NOTE — Telephone Encounter (Signed)
The Valsartan is not agreeing with her stomach. Her stomach is upset every day,Please call to advise.

## 2015-05-15 NOTE — Telephone Encounter (Signed)
Spoke with pt, for about one month she has nausea after the valsartan. She has tried taking it in the morning and at night with no change in the symptoms. She is taking the omeprazole. Her bp is running 145-148/80's. She would like to change to something else. Will forward for dr jordan's (dod) review

## 2015-05-15 NOTE — Telephone Encounter (Signed)
Options limited. We can try her on carvedilol 3.125 mg bid. May be limited by bradycardia. With carvedilol she can reduce Valsartan to 160 mg until she can be seen back in HTN clinic.  Dineen Conradt Martin[place] MD, [facility]

## 2015-05-20 ENCOUNTER — Ambulatory Visit (INDEPENDENT_AMBULATORY_CARE_PROVIDER_SITE_OTHER): Payer: BC Managed Care – PPO | Admitting: Pharmacist

## 2015-05-20 VITALS — BP 142/84 | HR 68 | Wt 216.2 lb

## 2015-05-20 DIAGNOSIS — I10 Essential (primary) hypertension: Secondary | ICD-10-CM

## 2015-05-20 NOTE — Progress Notes (Signed)
05/20/2015 [name] [birth year] [id number]   HPI:  DAINELLE HUN is a 64 y.o. female patient of Dr Debara Pickett, with a PMH below who presents today for hypertension clinic evaluation.  She called in last week to report nausea with her valsartan 320mg  daily.  Dr. Martinique suggested decreasing valsartan to 160mg  and starting carvediolol 3.125mg  BID.  She made this change on 6/20 and states she is feeling much better.  Her BP have been running high at home since this change but she is also taking her BP before she takes her medication.  They have been 675-916B systolic.  She is close to goal of <140/90 in office today.  HR stable with low dose beta blocker.   Cardiac Hx: has had hypertension for >20 years, also suffers from migranes  Family Hx: MI in mother, 3 grandparents, paternal uncle  Social Hx: rare alcohol, no tobacco, minimal caffeine  Diet: eats most meals at home, does not add salt  Exercise: Pt working with sister to get motivated to start an exercise program.   Home BP readings: average 846K systolic  Current antihypertensive medications: valsartan 160 mg qd, amlodipine 10 mg qd, carvediolol 3.125mg  BID   Current Outpatient Prescriptions  Medication Sig Dispense Refill  . ALPRAZolam (XANAX) 0.25 MG tablet Take 0.25 mg by mouth 2 (two) times daily as needed for sleep.    Marland Kitchen amLODipine (NORVASC) 10 MG tablet TAKE 1 TABLET BY MOUTH DAILY 30 tablet 9  . aspirin 81 MG tablet Take 81 mg by mouth daily.    . butalbital-acetaminophen-caffeine (FIORICET WITH CODEINE) 50-325-40-30 MG per capsule Take 1 capsule by mouth every 4 (four) hours as needed.    . carvedilol (COREG) 3.125 MG tablet Take 1 tablet (3.125 mg total) by mouth 2 (two) times daily. 60 tablet 12  . colchicine 0.6 MG tablet Take 2 pills, then repeat one pill an hour later 30 tablet 0  . desvenlafaxine (PRISTIQ) 100 MG 24 hr tablet Take 100 mg by mouth daily.    . Diclofenac Potassium (CAMBIA PO) Take  50 mg by mouth as needed.     . diclofenac sodium (VOLTAREN) 1 % GEL Apply 2 g topically 4 (four) times daily. 100 g 3  . Eszopiclone (ESZOPICLONE) 3 MG TABS Take 3 mg by mouth at bedtime. Take immediately before bedtime    . frovatriptan (FROVA) 2.5 MG tablet Take 2.5 mg by mouth as needed for migraine. If recurs, may repeat after 2 hours. Max of 3 tabs in 24 hours.    . gabapentin (NEURONTIN) 300 MG capsule Take 300 mg by mouth 3 (three) times daily.    Marland Kitchen HYDROcodone-acetaminophen (NORCO) 7.5-325 MG per tablet Take 1 tablet by mouth 2 (two) times daily as needed for pain.    Marland Kitchen ipratropium (ATROVENT) 0.03 % nasal spray Place 2 sprays into the nose 4 (four) times daily. 30 mL 1  . nitroGLYCERIN (NITROSTAT) 0.4 MG SL tablet Place 1 tablet (0.4 mg total) under the tongue every 5 (five) minutes as needed. 30 tablet 0  . omeprazole (PRILOSEC) 40 MG capsule Take 40 mg by mouth daily.    Marland Kitchen oxyCODONE-acetaminophen (ROXICET) 5-325 MG per tablet Take 1 tablet by mouth every 8 (eight) hours as needed for pain. (Patient not taking: Reported on 01/01/2015) 20 tablet 0  . valACYclovir (VALTREX) 500 MG tablet TAKE 1 TABLET BY MOUTH TWICE DAILY FOR 3 DAYS AS NEEDED FOR OUTBREAKS 30 tablet 4  . valsartan (DIOVAN) 320  MG tablet Take 0.5 tablets (160 mg total) by mouth daily. 30 tablet 3   No current facility-administered medications for this visit.    Allergies  Allergen Reactions  . Botox [Botulinum Toxin Type A]   . Hydrochlorothiazide Other (See Comments)    Increases risk of gout flares  . Toprol Xl [Metoprolol Succinate] Hives    Past Medical History  Diagnosis Date  . Headaches due to old head injury   . Hypertension   . Thyroid disease   . Reflux   . Arthritis   . GERD (gastroesophageal reflux disease)   . Depression   . Allergy   . Dyslipidemia   . Obesity     Blood pressure 142/84, pulse 68, weight 216 lb 4 oz (98.09 kg).   Assessment and Plan 1.  Hypertension- Pt's BP close to goal  in office today.  Suspect her home BP are higher because she is checking this before taking her morning medications.  Suggested she check her blood pressure a few hours after her AM medications to see if she is getting better readings.  Her nausea is better with lower dose valsartan.  Will continue for now.  If issues arise in the future, may need to switch to ACE-I as I found no reason patient has failed therapy in the past.  Will have her follow up with Dr. Debara Pickett for her regular appointment as long as her BP improve at home.  If no improvement, she was instructed to call the office for a follow up in the HTN clinic.

## 2015-06-05 ENCOUNTER — Other Ambulatory Visit: Payer: Self-pay | Admitting: Endocrinology

## 2015-06-05 DIAGNOSIS — E041 Nontoxic single thyroid nodule: Secondary | ICD-10-CM

## 2015-06-19 ENCOUNTER — Encounter: Payer: Self-pay | Admitting: Family Medicine

## 2015-06-19 ENCOUNTER — Other Ambulatory Visit: Payer: Self-pay | Admitting: Family Medicine

## 2015-06-19 DIAGNOSIS — M545 Low back pain, unspecified: Secondary | ICD-10-CM

## 2015-06-22 ENCOUNTER — Ambulatory Visit
Admission: RE | Admit: 2015-06-22 | Discharge: 2015-06-22 | Disposition: A | Discharge status: Home / Self Care | Payer: BC Managed Care – PPO | Source: Ambulatory Visit | Attending: Endocrinology | Admitting: Endocrinology

## 2015-06-22 DIAGNOSIS — E041 Nontoxic single thyroid nodule: Secondary | ICD-10-CM

## 2015-06-22 MED ORDER — OXYCODONE-ACETAMINOPHEN 5-325 MG PO TABS: 1.0000 | ORAL_TABLET | Freq: Three times a day (TID) | ORAL | Status: DC | PRN

## 2015-06-25 NOTE — Progress Notes (Signed)
Quick Note:  Please let patient know that the thyroid nodule is significantly larger, recommend needle biopsy. Will order if agreeable with her  ______

## 2015-07-01 ENCOUNTER — Other Ambulatory Visit: Payer: Self-pay | Admitting: Endocrinology

## 2015-07-01 DIAGNOSIS — E041 Nontoxic single thyroid nodule: Secondary | ICD-10-CM

## 2015-07-09 ENCOUNTER — Encounter: Payer: Self-pay | Admitting: Family Medicine

## 2015-07-20 ENCOUNTER — Ambulatory Visit (INDEPENDENT_AMBULATORY_CARE_PROVIDER_SITE_OTHER): Payer: BC Managed Care – PPO | Admitting: Pharmacist Clinician (PhC)/ Clinical Pharmacy Specialist

## 2015-07-20 ENCOUNTER — Encounter: Payer: Self-pay | Admitting: Pharmacist Clinician (PhC)/ Clinical Pharmacy Specialist

## 2015-07-20 VITALS — BP 150/80 | HR 60 | Ht 63.0 in | Wt 220.1 lb

## 2015-07-20 DIAGNOSIS — I10 Essential (primary) hypertension: Secondary | ICD-10-CM | POA: Diagnosis not present

## 2015-07-20 NOTE — Patient Instructions (Addendum)
Return for a a follow up appointment in 3 months  Your blood pressure today is 150/80  (goal is <150/90)  Check your blood pressure at home 2-4 times each week and keep record of the readings.  Take your BP meds as follows: switch either valsartan or amlodipine to evenings.  Continue all other medications as is  Bring all of your meds, your BP cuff and your record of home blood pressures to your next appointment.  Exercise as you're able, try to walk approximately 30 minutes per day.  Keep salt intake to a minimum, especially watch canned and prepared boxed foods.  Eat more fresh fruits and vegetables and fewer canned items.  Avoid eating in fast food restaurants.    HOW TO TAKE YOUR BLOOD PRESSURE: . Rest 5 minutes before taking your blood pressure. .  Don't smoke or drink caffeinated beverages for at least 30 minutes before. . Take your blood pressure before (not after) you eat. . Sit comfortably with your back supported and both feet on the floor (don't cross your legs). . Elevate your arm to heart level on a table or a desk. . Use the proper sized cuff. It should fit smoothly and snugly around your bare upper arm. There should be enough room to slip a fingertip under the cuff. The bottom edge of the cuff should be 1 inch above the crease of the elbow. . Ideally, take 3 measurements at one sitting and record the average.

## 2015-07-20 NOTE — Progress Notes (Signed)
07/20/2015 [name] 01/17/1951 [id number]   HPI:  [name] is a 64 y.o. female patient of Dr Debara Pickett, with a PMH below who presents today for hypertension clinic follow up.  She has previously reported nausea with her valsartan 320 mg, but is currently taking full dose without problems.  Her BP have been running high at home, still in the 702-637C systolic.  In the past few weeks she has been taken off her thyroid replacement, and is scheduled for a biopsy next week.   According to patient it is "out of whack" and she has gained about 5 pounds since stopping the medication.     Cardiac Hx: has had hypertension for >20 years, also suffers from migranes  Family Hx: MI in mother, 3 grandparents, paternal uncle  Social Hx: rare alcohol, no tobacco, minimal caffeine  Diet: eats most meals at home, does not add salt  Exercise: recently started walking 1/2 mile per day  Home BP readings: average 58-=850Y systolic  Current antihypertensive medications: valsartan 160 mg qd, amlodipine 10 mg qd, carvediolol 3.125mg  BID   Current Outpatient Prescriptions  Medication Sig Dispense Refill  . ALPRAZolam (XANAX) 0.25 MG tablet Take 0.25 mg by mouth 2 (two) times daily as needed for sleep.    Marland Kitchen amLODipine (NORVASC) 10 MG tablet TAKE 1 TABLET BY MOUTH DAILY 30 tablet 9  . aspirin 81 MG tablet Take 81 mg by mouth daily.    . butalbital-acetaminophen-caffeine (FIORICET WITH CODEINE) 50-325-40-30 MG per capsule Take 1 capsule by mouth every 4 (four) hours as needed.    . carvedilol (COREG) 3.125 MG tablet Take 1 tablet (3.125 mg total) by mouth 2 (two) times daily. 60 tablet 12  . colchicine 0.6 MG tablet Take 2 pills, then repeat one pill an hour later 30 tablet 0  . desvenlafaxine (PRISTIQ) 100 MG 24 hr tablet Take 100 mg by mouth daily.    . Diclofenac Potassium (CAMBIA PO) Take 50 mg by mouth as needed.     . diclofenac sodium (VOLTAREN) 1 % GEL Apply 2 g topically 4  (four) times daily. 100 g 3  . Eszopiclone (ESZOPICLONE) 3 MG TABS Take 3 mg by mouth at bedtime. Take immediately before bedtime    . frovatriptan (FROVA) 2.5 MG tablet Take 2.5 mg by mouth as needed for migraine. If recurs, may repeat after 2 hours. Max of 3 tabs in 24 hours.    . gabapentin (NEURONTIN) 300 MG capsule Take 300 mg by mouth 3 (three) times daily.    Marland Kitchen HYDROcodone-acetaminophen (NORCO) 7.5-325 MG per tablet Take 1 tablet by mouth 2 (two) times daily as needed for pain.    Marland Kitchen ipratropium (ATROVENT) 0.03 % nasal spray Place 2 sprays into the nose 4 (four) times daily. 30 mL 1  . nitroGLYCERIN (NITROSTAT) 0.4 MG SL tablet Place 1 tablet (0.4 mg total) under the tongue every 5 (five) minutes as needed. 30 tablet 0  . omeprazole (PRILOSEC) 40 MG capsule Take 40 mg by mouth daily.    Marland Kitchen oxyCODONE-acetaminophen (ROXICET) 5-325 MG per tablet Take 1 tablet by mouth every 8 (eight) hours as needed. 30 tablet 0  . valACYclovir (VALTREX) 500 MG tablet TAKE 1 TABLET BY MOUTH TWICE DAILY FOR 3 DAYS AS NEEDED FOR OUTBREAKS 30 tablet 4  . valsartan (DIOVAN) 320 MG tablet Take 0.5 tablets (160 mg total) by mouth daily. 30 tablet 3   No current facility-administered medications for this visit.  Allergies  Allergen Reactions  . Botox [Botulinum Toxin Type A]   . Hydrochlorothiazide Other (See Comments)    Increases risk of gout flares  . Toprol Xl [Metoprolol Succinate] Hives    Past Medical History  Diagnosis Date  . Headaches due to old head injury   . Hypertension   . Thyroid disease   . Reflux   . Arthritis   . GERD (gastroesophageal reflux disease)   . Depression   . Allergy   . Dyslipidemia   . Obesity     Blood pressure 150/80, pulse 60, height 5\' 3"  (1.6 m), weight 220 lb 1.6 oz (99.837 kg).     Tommy Medal PharmD CPP [facility]

## 2015-07-20 NOTE — Assessment & Plan Note (Signed)
Today her BP in the office is at goal, but home readings are reading higher.  She currently takes the valsartan and amlodipine at the same time each morning, and checks her BP about then.  I suggested she move one of the two meds to evening.  My hope is that this will give her better morning BP readings.  She is due for a needle biopsy on her thyroid next week and will hopefully be able to get her medications sorted out soon thereafter.  Because she is right on the goal line, I suggested we continue with this small change in timing of medications and watch over the next 3 months to see what her BP does as she hopefully gets her thyroid problems sorted out.  She will need to bring her newer BP cuff with her to verify it's accuracy.  I will see her again in 3 months.

## 2015-07-28 ENCOUNTER — Other Ambulatory Visit (HOSPITAL_COMMUNITY)
Admission: RE | Admit: 2015-07-28 | Discharge: 2015-07-28 | Disposition: A | Discharge status: Home / Self Care | Payer: BC Managed Care – PPO | Source: Ambulatory Visit | Attending: Endocrinology | Admitting: Endocrinology

## 2015-07-28 ENCOUNTER — Ambulatory Visit
Admission: RE | Admit: 2015-07-28 | Discharge: 2015-07-28 | Disposition: A | Discharge status: Home / Self Care | Payer: BC Managed Care – PPO | Source: Ambulatory Visit | Attending: Endocrinology | Admitting: Endocrinology

## 2015-07-28 DIAGNOSIS — E041 Nontoxic single thyroid nodule: Secondary | ICD-10-CM | POA: Insufficient documentation

## 2015-07-28 NOTE — Procedures (Signed)
  US guided L thyroid nodule biopsy  4  25g needle sample obtained Pt tolerated well

## 2015-08-17 ENCOUNTER — Other Ambulatory Visit: Payer: Self-pay | Admitting: Internal Medicine

## 2015-08-17 NOTE — Telephone Encounter (Signed)
Rx request sent to pharmacy.  

## 2015-08-27 ENCOUNTER — Encounter: Payer: Self-pay | Admitting: Internal Medicine

## 2015-08-27 ENCOUNTER — Ambulatory Visit (INDEPENDENT_AMBULATORY_CARE_PROVIDER_SITE_OTHER): Payer: BC Managed Care – PPO | Admitting: Internal Medicine

## 2015-08-27 VITALS — BP 146/78 | HR 63 | Ht 63.0 in | Wt 216.6 lb

## 2015-08-27 DIAGNOSIS — Z79899 Other long term (current) drug therapy: Secondary | ICD-10-CM

## 2015-08-27 DIAGNOSIS — R0789 Other chest pain: Secondary | ICD-10-CM

## 2015-08-27 DIAGNOSIS — E669 Obesity, unspecified: Secondary | ICD-10-CM

## 2015-08-27 DIAGNOSIS — M109 Gout, unspecified: Secondary | ICD-10-CM | POA: Diagnosis not present

## 2015-08-27 DIAGNOSIS — E785 Hyperlipidemia, unspecified: Secondary | ICD-10-CM

## 2015-08-27 DIAGNOSIS — I10 Essential (primary) hypertension: Secondary | ICD-10-CM

## 2015-08-27 NOTE — Patient Instructions (Signed)
Your physician recommends that you return for lab work FASTING  Your physician wants you to follow-up in: 1 year with Dr. Hilty. You will receive a reminder letter in the mail two months in advance. If you don't receive a letter, please call our office to schedule the follow-up appointment.  

## 2015-08-28 NOTE — Progress Notes (Signed)
OFFICE NOTE  Chief Complaint:  Mild neck pain  Primary Care Physician: Lamar Blinks, MD  HPI:  [name] is a pleasant 64 year old female with a strong family history of heart disease. Her mother died of a sudden heart attack at age 59, her both grandparents had heart disease and a grandmother had a stroke at age 37. She was seen remotely by cardiologist more than 3 years ago and had stress testing at that time which was negative. Back in November she was complaining of chest pain which was intermittent. It was somewhat worse at that time however she had an active bronchitis and was treated. Her symptoms improved somewhat however she still had some chest pain since that time. Some of it is associated with exertion and relieved by rest and other times it comes fairly randomly. It does feel like a squeezing sensation in the upper chest it does not radiate to the neck, arm or back. She describes the discomfort as a 5 or 6/10 in severity.  She occasionally takes nitroglycerin for this however her old prescription was only recently renewed.  She is desiring to start an exercise program and would like cardiovascular evaluation and risk stratification prior to that.  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  [name] had a recent nuclear stress test which was negative.  This demonstrated no reversible ischemia. She also had a lipid NMR performed, which demonstrated elevated LDL cholesterol of 117, with elevated LDL particle numbers to 1347.  Her contents a metabolic profile was normal.  Her blood pressure today is much improved. She denies any further chest pain and only occasionally has migraine headaches. We have not assessed her cholesterol since her last lipid profile and she's not on treatment for  her elevated cholesterol.  I saw [name] back in the office today. Her main complaint is some discomfort over the right trapezius area. She says this is usually worse with stress and tension. She denies any chest pain per se today. Recently her headaches have improved somewhat. Her cholesterol has been fairly well-controlled. Her blood pressure is at goal today. Unfortunate she's not managed to lose much weight and remains close to morbid obesity with BMI of 38.  PMHx:  Past Medical History  Diagnosis Date  . Headaches due to old head injury   . Hypertension   . Thyroid disease   . Reflux   . Arthritis   . GERD (gastroesophageal reflux disease)   . Depression   . Allergy   . Dyslipidemia   . Obesity     Past Surgical History  Procedure Laterality Date  . Facet joint injection  2011 and 2012  . Breast surgery  2003    reduction  . Thyroidectomy  1989  . Tubal ligation    . Abdominal hysterectomy    . Myomectomy    . Cesarean section    . Transthoracic echocardiogram  03/2009    EF=>55%, borderline conc LVH; mild mitral annular calcif, trace MR; trace TR; AV mildly sclerotic, mild AV regurg; mild pulm valve regurg   . Nm myocar perf wall motion  03/2009    dipyridamole; normal pattern of perfusion in all regions, post-stress EF 65%, normal study     FAMHx:  Family History  Problem Relation Age of Onset  .  Heart disease Mother     MI, HTN  . Hypertension Mother   . Thyroid disease Mother   . Heart disease Maternal Grandmother     MI, CVA  . Heart disease Maternal Grandfather     MI  . Stroke Maternal Grandfather   . Heart disease Paternal Grandmother   . Stroke Paternal Grandmother   . Heart disease Paternal Grandfather     MI  . Glaucoma Father   . Diabetes Father   . Depression Daughter   . ADD / ADHD Son   . Heart attack Paternal Uncle     HTN  . Hypertension Paternal Uncle   . Thyroid disease Sister     SOCHx:   reports that she has never smoked.  She has never used smokeless tobacco. She reports that she drinks about 0.5 - 1.0 oz of alcohol per week. She reports that she does not use illicit drugs.  ALLERGIES:  Allergies  Allergen Reactions  . Botox [Botulinum Toxin Type A]   . Hydrochlorothiazide Other (See Comments)    Increases risk of gout flares  . Toprol Xl [Metoprolol Succinate] Hives    ROS: A comprehensive review of systems was negative except for: Musculoskeletal: positive for neck pain  HOME MEDS: Current Outpatient Prescriptions  Medication Sig Dispense Refill  . ALPRAZolam (XANAX) 0.25 MG tablet Take 0.25 mg by mouth 2 (two) times daily as needed for sleep.    Marland Kitchen amLODipine (NORVASC) 10 MG tablet TAKE 1 TABLET BY MOUTH DAILY 30 tablet 9  . aspirin 81 MG tablet Take 81 mg by mouth daily.    . butalbital-acetaminophen-caffeine (FIORICET WITH CODEINE) 50-325-40-30 MG per capsule Take 1 capsule by mouth every 4 (four) hours as needed.    . carvedilol (COREG) 3.125 MG tablet Take 1 tablet (3.125 mg total) by mouth 2 (two) times daily. 60 tablet 12  . colchicine 0.6 MG tablet Take 2 pills, then repeat one pill an hour later 30 tablet 0  . desvenlafaxine (PRISTIQ) 100 MG 24 hr tablet Take 100 mg by mouth daily.    . Diclofenac Potassium (CAMBIA PO) Take 50 mg by mouth as needed.     . diclofenac sodium (VOLTAREN) 1 % GEL Apply 2 g topically 4 (four) times daily. 100 g 3  . Eszopiclone (ESZOPICLONE) 3 MG TABS Take 3 mg by mouth at bedtime. Take immediately before bedtime    . frovatriptan (FROVA) 2.5 MG tablet Take 2.5 mg by mouth as needed for migraine. If recurs, may repeat after 2 hours. Max of 3 tabs in 24 hours.    . gabapentin (NEURONTIN) 300 MG capsule Take 300 mg by mouth 3 (three) times daily.    Marland Kitchen HYDROcodone-acetaminophen (NORCO) 7.5-325 MG per tablet Take 1 tablet by mouth 2 (two) times daily as needed for pain.    Marland Kitchen ipratropium (ATROVENT) 0.03 % nasal spray Place 2 sprays into the nose 4 (four) times daily. 30  mL 1  . nitroGLYCERIN (NITROSTAT) 0.4 MG SL tablet Place 1 tablet (0.4 mg total) under the tongue every 5 (five) minutes as needed. 30 tablet 0  . omeprazole (PRILOSEC) 40 MG capsule Take 40 mg by mouth daily.    Marland Kitchen oxyCODONE-acetaminophen (ROXICET) 5-325 MG per tablet Take 1 tablet by mouth every 8 (eight) hours as needed. 30 tablet 0  . valACYclovir (VALTREX) 500 MG tablet TAKE 1 TABLET BY MOUTH TWICE DAILY FOR 3 DAYS AS NEEDED FOR OUTBREAKS 30 tablet 4  . valsartan (DIOVAN) 320  MG tablet TAKE 1 TABLET BY MOUTH EVERY DAY 30 tablet 0   No current facility-administered medications for this visit.    LABS/IMAGING: No results found for this or any previous visit (from the past 48 hour(s)). No results found.  VITALS: BP 146/78 mmHg  Pulse 63  Ht 5\' 3"  (1.6 m)  Wt 216 lb 9.6 oz (98.249 kg)  BMI 38.38 kg/m2  EXAM: General appearance: alert and no distress Neck: no carotid bruit and no JVD Lungs: clear to auscultation bilaterally Heart: regular rate and rhythm, S1, S2 normal, no murmur, click, rub or gallop Abdomen: soft, non-tender; bowel sounds normal; no masses,  no organomegaly Extremities: extremities normal, atraumatic, no cyanosis or edema Pulses: 2+ and symmetric Skin: Skin color, texture, turgor normal. No rashes or lesions Neurologic: Grossly normal  EKG: Normal sinus rhythm at 63, mild nonspecific T-wave changes  ASSESSMENT: 1. Dyslipidemia - last LDL 130 in 01/2013, not on statin 2. Hypertension - controlled 3. Obesity 4. History of TBI with headaches and memory loss 5. Strong family history of premature coronary artery disease  PLAN: 1.   [name] had a negative nuclear stress test which was low risk. Her chest pain is now resolved. Her blood pressure is much better controlled. She is due for repeat cholesterol test. Otherwise no changes to her medications at this time. Plan to see her back annually or sooner as necessary.  Pixie Casino, MD,  [facility] Attending Cardiologist [place] [facility] 08/28/2015, 9:57 AM

## 2015-09-05 ENCOUNTER — Telehealth: Payer: Self-pay | Admitting: Family

## 2015-09-05 DIAGNOSIS — J309 Allergic rhinitis, unspecified: Secondary | ICD-10-CM

## 2015-09-05 DIAGNOSIS — J069 Acute upper respiratory infection, unspecified: Secondary | ICD-10-CM

## 2015-09-05 MED ORDER — BENZONATATE 200 MG PO CAPS: 200.0000 mg | ORAL_CAPSULE | Freq: Three times a day (TID) | ORAL | Status: DC | PRN

## 2015-09-05 NOTE — Progress Notes (Signed)
We are sorry that you are not feeling well.  Here is how we plan to help!  Based on what you have shared with me it looks like you have upper respiratory tract inflammation that has resulted in a significant cough.  Inflammation and infection in the upper respiratory tract is commonly called bronchitis and has four common causes:  Allergies, Viral Infections, Acid Reflux and Bacterial Infections.  Allergies, viruses and acid reflux are treated by controlling symptoms or eliminating the cause. An example might be a cough caused by taking certain blood pressure medications. You stop the cough by changing the medication. Another example might be a cough caused by acid reflux. Controlling the reflux helps control the cough.  Based on your presentation I believe you most likely have A cough due to allergies.  I recommend that you start the an over-the counter-allergy medication such as Claritin 10 mg or Zyrtec 10 mg daily.    In addition you may use A non-prescription cough medication called Robitussin DAC. Take 2 teaspoons every 8 hours or Delsym: take 2 teaspoons every 12 hours., A non-prescription cough medication called Mucinex DM: take 2 tablets every 12 hours. and A prescription cough medication called Tessalon Perles 100mg. You may take 1-2 capsules every 8 hours as needed for your cough.    HOME CARE . Only take medications as instructed by your medical team. . Complete the entire course of an antibiotic. . Drink plenty of fluids and get plenty of rest. . Avoid close contacts especially the very young and the elderly . Cover your mouth if you cough or cough into your sleeve. . Always remember to wash your hands . A steam or ultrasonic humidifier can help congestion.    GET HELP RIGHT AWAY IF: . You develop worsening fever. . You become short of breath . You cough up blood. . Your symptoms persist after you have completed your treatment plan MAKE SURE YOU   Understand these  instructions.  Will watch your condition.  Will get help right away if you are not doing well or get worse.  Your e-visit answers were reviewed by a board certified advanced clinical practitioner to complete your personal care plan.  Depending on the condition, your plan could have included both over the counter or prescription medications. If there is a problem please reply  once you have received a response from your provider. Your safety is important to us.  If you have drug allergies check your prescription carefully.    You can use MyChart to ask questions about today's visit, request a non-urgent call back, or ask for a work or school excuse for 24 hours related to this e-Visit. If it has been greater than 24 hours you will need to follow up with your provider, or enter a new e-Visit to address those concerns. You will get an e-mail in the next two days asking about your experience.  I hope that your e-visit has been valuable and will speed your recovery. Thank you for using e-visits.   

## 2015-09-15 ENCOUNTER — Telehealth: Payer: Self-pay | Admitting: *Deleted

## 2015-09-15 NOTE — Telephone Encounter (Signed)
Called pt to follow up with on 09/15/15 e-Visit per her request and she stated that she was trying to get a copy of her visit.  She waited 24 hours and logged back in to [place] and found it there.  Nothing further was needed.

## 2015-09-20 ENCOUNTER — Other Ambulatory Visit: Payer: Self-pay | Admitting: Internal Medicine

## 2015-09-30 ENCOUNTER — Encounter: Payer: Self-pay | Admitting: Family Medicine

## 2015-09-30 ENCOUNTER — Ambulatory Visit (INDEPENDENT_AMBULATORY_CARE_PROVIDER_SITE_OTHER): Payer: BC Managed Care – PPO | Admitting: Family Medicine

## 2015-09-30 VITALS — BP 140/90 | HR 65 | Temp 98.4°F | Resp 16 | Ht 63.0 in | Wt 218.4 lb

## 2015-09-30 DIAGNOSIS — B372 Candidiasis of skin and nail: Secondary | ICD-10-CM | POA: Diagnosis not present

## 2015-09-30 DIAGNOSIS — Z23 Encounter for immunization: Secondary | ICD-10-CM | POA: Diagnosis not present

## 2015-09-30 LAB — LIPID PANEL
CHOLESTEROL: 189 mg/dL (ref 125–200)
HDL: 59 mg/dL (ref >=46)
LDL Cholesterol: 114 mg/dL (ref <130)
TRIGLYCERIDES: 79 mg/dL (ref <150)
Total CHOL/HDL Ratio: 3.2 Ratio (ref <=5.0)
VLDL: 16 mg/dL (ref <30)

## 2015-09-30 LAB — COMPREHENSIVE METABOLIC PANEL
ALBUMIN: 3.8 g/dL (ref 3.6–5.1)
ALT: 15 U/L (ref 6–29)
AST: 19 U/L (ref 10–35)
Alkaline Phosphatase: 71 U/L (ref 33–130)
BILIRUBIN TOTAL: 0.4 mg/dL (ref 0.2–1.2)
BUN: 9 mg/dL (ref 7–25)
CO2: 30 mmol/L (ref 20–31)
Calcium: 8.9 mg/dL (ref 8.6–10.4)
Chloride: 106 mmol/L (ref 98–110)
Creat: 0.73 mg/dL (ref 0.50–0.99)
GLUCOSE: 100 mg/dL — AB (ref 65–99)
Potassium: 3.5 mmol/L (ref 3.5–5.3)
Sodium: 143 mmol/L (ref 135–146)
TOTAL PROTEIN: 6.6 g/dL (ref 6.1–8.1)

## 2015-09-30 LAB — URIC ACID: Uric Acid, Serum: 6.2 mg/dL (ref 2.4–7.0)

## 2015-09-30 MED ORDER — FLUCONAZOLE 150 MG PO TABS: 150.0000 mg | ORAL_TABLET | Freq: Once | ORAL | Status: DC

## 2015-09-30 NOTE — Patient Instructions (Signed)
We are going to try treating your skin yeast (candida intertrigo) with fluconazole by mouth.  Take one pill weekly for 3-4 weeks.  Let me know if this does not help Continue trying to keep the area as dry as you can If we are not successful and you would like to consult with a surgeon just let me know

## 2015-09-30 NOTE — Progress Notes (Signed)
[facility] [address], [place] [address] [phone]  Date:  09/30/2015   Name:  [name]   DOB:  November 11, 1951   MRN:  [id number]  PCP:  Lamar Blinks, MD    Chief Complaint: Rash under belly   History of Present Illness:  [name] is a 64 y.o. very pleasant female patient who presents with the following:  sher has noted a rash under her belly- this has been present for about 6 months; it got worse over the summer.  She walks for exercise and this seems to make it worse.  She notes that the area will hurt, "crack open," and she is bothered by odor just a few hours after bathing.  The area seems to form along a skin fold that is due to a surgical scar.  She wonders if a tummy tuck might get rid of this skin fold, but would like to try an available medical treatment prior to consider ing surgery  She has tried lotromin, and an aborbent powder.   She tries to keep the area very dry She otherwise feels well and does not have any other concerns today Would like a flu shot Patient Active Problem List   Diagnosis Date Noted  . Left thyroid nodule 11/10/2014  . TBI (traumatic brain injury) ([place]) 07/15/2014  . Chest pain 12/13/2013  . Dyslipidemia 12/13/2013  . Obesity (BMI 35.0-39.9 without comorbidity) ([place]) 12/13/2013  . Hypertension 01/18/2012  . GERD (gastroesophageal reflux disease) 01/18/2012  . Depression 01/18/2012  . Migraine syndrome 01/18/2012    Past Medical History  Diagnosis Date  . Headaches due to old head injury   . Hypertension   . Thyroid disease   . Reflux   . Arthritis   . GERD (gastroesophageal reflux disease)   . Depression   . Allergy   . Dyslipidemia   . Obesity     Past Surgical History  Procedure Laterality Date  . Facet joint injection  2011 and 2012  . Breast surgery  2003    reduction  . Thyroidectomy  1989  . Tubal ligation    . Abdominal hysterectomy    . Myomectomy    .  Cesarean section    . Transthoracic echocardiogram  03/2009    EF=>55%, borderline conc LVH; mild mitral annular calcif, trace MR; trace TR; AV mildly sclerotic, mild AV regurg; mild pulm valve regurg   . Nm myocar perf wall motion  03/2009    dipyridamole; normal pattern of perfusion in all regions, post-stress EF 65%, normal study     Social History  Substance Use Topics  . Smoking status: Never Smoker   . Smokeless tobacco: Never Used  . Alcohol Use: 0.5 - 1.0 oz/week    1-2 Standard drinks or equivalent per week    Family History  Problem Relation Age of Onset  . Heart disease Mother     MI, HTN  . Hypertension Mother   . Thyroid disease Mother   . Heart disease Maternal Grandmother     MI, CVA  . Heart disease Maternal Grandfather     MI  . Stroke Maternal Grandfather   . Heart disease Paternal Grandmother   . Stroke Paternal Grandmother   . Heart disease Paternal Grandfather     MI  . Glaucoma Father   . Diabetes Father   . Depression Daughter   . ADD / ADHD Son   . Heart attack Paternal Uncle  HTN  . Hypertension Paternal Uncle   . Thyroid disease Sister     Allergies  Allergen Reactions  . Botox [Botulinum Toxin Type A]   . Hydrochlorothiazide Other (See Comments)    Increases risk of gout flares  . Toprol Xl [Metoprolol Succinate] Hives    Medication list has been reviewed and updated.  Current Outpatient Prescriptions on File Prior to Visit  Medication Sig Dispense Refill  . ALPRAZolam (XANAX) 0.25 MG tablet Take 0.25 mg by mouth 2 (two) times daily as needed for sleep.    Marland Kitchen amLODipine (NORVASC) 10 MG tablet TAKE 1 TABLET BY MOUTH DAILY 30 tablet 9  . aspirin 81 MG tablet Take 81 mg by mouth daily.    . benzonatate (TESSALON) 200 MG capsule Take 1 capsule (200 mg total) by mouth 3 (three) times daily as needed for cough. 20 capsule 0  . butalbital-acetaminophen-caffeine (FIORICET WITH CODEINE) 50-325-40-30 MG per capsule Take 1 capsule by mouth every  4 (four) hours as needed.    . carvedilol (COREG) 3.125 MG tablet Take 1 tablet (3.125 mg total) by mouth 2 (two) times daily. 60 tablet 12  . colchicine 0.6 MG tablet Take 2 pills, then repeat one pill an hour later 30 tablet 0  . desvenlafaxine (PRISTIQ) 100 MG 24 hr tablet Take 100 mg by mouth daily.    . Diclofenac Potassium (CAMBIA PO) Take 50 mg by mouth as needed.     . diclofenac sodium (VOLTAREN) 1 % GEL Apply 2 g topically 4 (four) times daily. 100 g 3  . Eszopiclone (ESZOPICLONE) 3 MG TABS Take 3 mg by mouth at bedtime. Take immediately before bedtime    . frovatriptan (FROVA) 2.5 MG tablet Take 2.5 mg by mouth as needed for migraine. If recurs, may repeat after 2 hours. Max of 3 tabs in 24 hours.    . gabapentin (NEURONTIN) 300 MG capsule Take 300 mg by mouth 3 (three) times daily.    Marland Kitchen HYDROcodone-acetaminophen (NORCO) 7.5-325 MG per tablet Take 1 tablet by mouth 2 (two) times daily as needed for pain.    Marland Kitchen ipratropium (ATROVENT) 0.03 % nasal spray Place 2 sprays into the nose 4 (four) times daily. 30 mL 1  . nitroGLYCERIN (NITROSTAT) 0.4 MG SL tablet Place 1 tablet (0.4 mg total) under the tongue every 5 (five) minutes as needed. 30 tablet 0  . omeprazole (PRILOSEC) 40 MG capsule Take 40 mg by mouth daily.    Marland Kitchen oxyCODONE-acetaminophen (ROXICET) 5-325 MG per tablet Take 1 tablet by mouth every 8 (eight) hours as needed. 30 tablet 0  . valACYclovir (VALTREX) 500 MG tablet TAKE 1 TABLET BY MOUTH TWICE DAILY FOR 3 DAYS AS NEEDED FOR OUTBREAKS 30 tablet 4  . valsartan (DIOVAN) 320 MG tablet TAKE 1 TABLET BY MOUTH EVERY DAY 30 tablet 10   No current facility-administered medications on file prior to visit.    Review of Systems:  As per HPI- otherwise negative.  BP Readings from Last 3 Encounters:  09/30/15 140/100  08/27/15 146/78  07/20/15 150/80      Physical Examination: Filed Vitals:   09/30/15 1430  BP: 140/100  Pulse: 65  Temp: 98.4 F (36.9 C)  Resp: 16   Filed  Vitals:   09/30/15 1430  Height: 5\' 3"  (1.6 m)  Weight: 218 lb 6.4 oz (99.066 kg)   Body mass index is 38.7 kg/(m^2). Ideal Body Weight: Weight in (lb) to have BMI = 25: 140.8  GEN: WDWN, NAD,  Non-toxic, A & O x 3, obese, looks well HEENT: Atraumatic, Normocephalic. Neck supple. No masses, No LAD. Ears and Nose: No external deformity. CV: RRR, No M/G/R. No JVD. No thrill. No extra heart sounds. PULM: CTA B, no wheezes, crackles, rhonchi. No retractions. No resp. distress. No accessory muscle use. ABD: S, NT, ND EXTR: No c/c/e NEURO Normal gait.  PSYCH: Normally interactive. Conversant. Not depressed or anxious appearing.  Calm demeanor.  Candida intertrigo under her pannus.  This is not severe today but she reports that "today is a good day."  No break in the skin   Assessment and Plan: Candidal intertrigo - Plan: fluconazole (DIFLUCAN) 150 MG tablet  Flu vaccine need - Plan: Flu Vaccine QUAD 36+ mos IM  Treat with weekly diflucan for a month or so.  She will let me know if not improved counseled not to combine diflucan with colchicine that she uses on rare occasion.   Will pull her paper chart to go over health maint   Rechecked her BP - better.  She is followed also by cardiology  Signed Lamar Blinks, MD

## 2015-10-06 ENCOUNTER — Encounter: Payer: Self-pay | Admitting: *Deleted

## 2015-10-14 ENCOUNTER — Other Ambulatory Visit: Payer: Self-pay | Admitting: Family Medicine

## 2015-10-21 ENCOUNTER — Ambulatory Visit: Payer: BC Managed Care – PPO | Admitting: Pharmacist Clinician (PhC)/ Clinical Pharmacy Specialist

## 2015-12-18 ENCOUNTER — Encounter: Payer: Self-pay | Admitting: Family Medicine

## 2015-12-23 ENCOUNTER — Encounter: Payer: Self-pay | Admitting: Family Medicine

## 2016-02-04 ENCOUNTER — Telehealth: Payer: Self-pay | Admitting: Family Medicine

## 2016-02-04 ENCOUNTER — Ambulatory Visit (INDEPENDENT_AMBULATORY_CARE_PROVIDER_SITE_OTHER): Payer: BC Managed Care – PPO | Admitting: Family Medicine

## 2016-02-04 ENCOUNTER — Encounter: Payer: Self-pay | Admitting: Family Medicine

## 2016-02-04 VITALS — BP 126/70 | HR 81 | Temp 97.6°F | Ht 63.0 in | Wt 217.0 lb

## 2016-02-04 DIAGNOSIS — Z1159 Encounter for screening for other viral diseases: Secondary | ICD-10-CM

## 2016-02-04 DIAGNOSIS — Z23 Encounter for immunization: Secondary | ICD-10-CM | POA: Diagnosis not present

## 2016-02-04 DIAGNOSIS — E65 Localized adiposity: Secondary | ICD-10-CM

## 2016-02-04 DIAGNOSIS — M25521 Pain in right elbow: Secondary | ICD-10-CM

## 2016-02-04 DIAGNOSIS — Z131 Encounter for screening for diabetes mellitus: Secondary | ICD-10-CM

## 2016-02-04 DIAGNOSIS — B372 Candidiasis of skin and nail: Secondary | ICD-10-CM | POA: Diagnosis not present

## 2016-02-04 LAB — HEMOGLOBIN A1C: Hgb A1c MFr Bld: 5.9 % (ref 4.6–6.5)

## 2016-02-04 MED ORDER — FLUCONAZOLE 150 MG PO TABS: 150.0000 mg | ORAL_TABLET | Freq: Once | ORAL | Status: DC

## 2016-02-04 NOTE — Progress Notes (Signed)
[place] at [facility] [address], [place], [place] [address] [phone]  Date:  02/04/2016   Name:  [name]   DOB:  Aug 31, 1951   MRN:  [id number]  PCP:  Lamar Blinks, MD    Chief Complaint: Vaginal Itching   History of Present Illness:  [name] is a 65 y.o. very pleasant female patient who presents with the following:  She was seen in November with candida intertrigo, worst under her pannus. She got better for a time but has gotten worse again since January She is exercising and has lost some weight, but activity and sweating makes her rash worse  We did use diflucan last time and it did help She would like to consider a tummy tuck procedure to get rid of this irritating rash.    She had had a hysterectomy, C/s x2, myomectomy x2 done through the abdomen.    Otherwise she is feeling well today She does need an A1c check to make sure this is not contributing to her frequent yeast rashes   Lab Results  Component Value Date   HGBA1C  01/09/2009    5.8 (NOTE)   The ADA recommends the following therapeutic goal for glycemic   control related to Hgb A1C measurement:   Goal of Therapy:   < 7.0% Hgb A1C   Reference: American Diabetes Association: Clinical Practice   Recommendations 2008, Diabetes Care,  2008, 31:(Suppl 1).       Patient Active Problem List   Diagnosis Date Noted  . Left thyroid nodule 11/10/2014  . TBI (traumatic brain injury) ([place]) 07/15/2014  . Chest pain 12/13/2013  . Dyslipidemia 12/13/2013  . Obesity (BMI 35.0-39.9 without comorbidity) ([place]) 12/13/2013  . Hypertension 01/18/2012  . GERD (gastroesophageal reflux disease) 01/18/2012  . Depression 01/18/2012  . Migraine syndrome 01/18/2012    Past Medical History  Diagnosis Date  . Headaches due to old head injury   . Hypertension   . Thyroid disease   . Reflux   . Arthritis   . GERD (gastroesophageal reflux  disease)   . Depression   . Allergy   . Dyslipidemia   . Obesity     Past Surgical History  Procedure Laterality Date  . Facet joint injection  2011 and 2012  . Breast surgery  2003    reduction  . Thyroidectomy  1989  . Tubal ligation    . Abdominal hysterectomy    . Myomectomy    . Cesarean section    . Transthoracic echocardiogram  03/2009    EF=>55%, borderline conc LVH; mild mitral annular calcif, trace MR; trace TR; AV mildly sclerotic, mild AV regurg; mild pulm valve regurg   . Nm myocar perf wall motion  03/2009    dipyridamole; normal pattern of perfusion in all regions, post-stress EF 65%, normal study     Social History  Substance Use Topics  . Smoking status: Never Smoker   . Smokeless tobacco: Never Used  . Alcohol Use: 0.5 - 1.0 oz/week    1-2 Standard drinks or equivalent per week    Family History  Problem Relation Age of Onset  . Heart disease Mother     MI, HTN  . Hypertension Mother   . Thyroid disease Mother   . Heart disease Maternal Grandmother     MI, CVA  . Heart disease Maternal Grandfather     MI  . Stroke Maternal Grandfather   .  Heart disease Paternal Grandmother   . Stroke Paternal Grandmother   . Heart disease Paternal Grandfather     MI  . Glaucoma Father   . Diabetes Father   . Depression Daughter   . ADD / ADHD Son   . Heart attack Paternal Uncle     HTN  . Hypertension Paternal Uncle   . Thyroid disease Sister     Allergies  Allergen Reactions  . Botox [Botulinum Toxin Type A]   . Hydrochlorothiazide Other (See Comments)    Increases risk of gout flares  . Toprol Xl [Metoprolol Succinate] Hives    Medication list has been reviewed and updated.  Current Outpatient Prescriptions on File Prior to Visit  Medication Sig Dispense Refill  . ALPRAZolam (XANAX) 0.25 MG tablet Take 0.25 mg by mouth 2 (two) times daily as needed for sleep.    Marland Kitchen amLODipine (NORVASC) 10 MG tablet TAKE 1 TABLET BY MOUTH DAILY 30 tablet 9  .  aspirin 81 MG tablet Take 81 mg by mouth daily.    . benzonatate (TESSALON) 200 MG capsule Take 1 capsule (200 mg total) by mouth 3 (three) times daily as needed for cough. 20 capsule 0  . butalbital-acetaminophen-caffeine (FIORICET WITH CODEINE) 50-325-40-30 MG per capsule Take 1 capsule by mouth every 4 (four) hours as needed.    . carvedilol (COREG) 3.125 MG tablet Take 1 tablet (3.125 mg total) by mouth 2 (two) times daily. 60 tablet 12  . colchicine 0.6 MG tablet Take 2 pills, then repeat one pill an hour later 30 tablet 0  . desvenlafaxine (PRISTIQ) 100 MG 24 hr tablet Take 100 mg by mouth daily.    . Diclofenac Potassium (CAMBIA PO) Take 50 mg by mouth as needed.     . diclofenac sodium (VOLTAREN) 1 % GEL Apply 2 g topically 4 (four) times daily. 100 g 3  . Eszopiclone (ESZOPICLONE) 3 MG TABS Take 3 mg by mouth at bedtime. Take immediately before bedtime    . fluconazole (DIFLUCAN) 150 MG tablet Take 1 tablet (150 mg total) by mouth once. Repeat weekly as needed 8 tablet 0  . frovatriptan (FROVA) 2.5 MG tablet Take 2.5 mg by mouth as needed for migraine. If recurs, may repeat after 2 hours. Max of 3 tabs in 24 hours.    . gabapentin (NEURONTIN) 300 MG capsule Take 300 mg by mouth 3 (three) times daily.    Marland Kitchen HYDROcodone-acetaminophen (NORCO) 7.5-325 MG per tablet Take 1 tablet by mouth 2 (two) times daily as needed for pain.    Marland Kitchen ipratropium (ATROVENT) 0.03 % nasal spray Place 2 sprays into the nose 4 (four) times daily. 30 mL 1  . nitroGLYCERIN (NITROSTAT) 0.4 MG SL tablet Place 1 tablet (0.4 mg total) under the tongue every 5 (five) minutes as needed. 30 tablet 0  . omeprazole (PRILOSEC) 40 MG capsule Take 40 mg by mouth daily.    Marland Kitchen oxyCODONE-acetaminophen (ROXICET) 5-325 MG per tablet Take 1 tablet by mouth every 8 (eight) hours as needed. 30 tablet 0  . valACYclovir (VALTREX) 500 MG tablet TAKE 1 TABLET BY MOUTH TWICE DAILY FOR 3 DAYS AS NEEDED FOR OUTBREAKS 30 tablet 4  . valsartan  (DIOVAN) 320 MG tablet TAKE 1 TABLET BY MOUTH EVERY DAY 30 tablet 10   No current facility-administered medications on file prior to visit.    Review of Systems:  As per HPI- otherwise negative.   Physical Examination: Filed Vitals:   02/04/16 1024  BP: 126/70  Pulse: 81  Temp: 97.6 F (36.4 C)   Filed Vitals:   02/04/16 1024  Height: 5\' 3"  (1.6 m)  Weight: 217 lb (98.431 kg)   Body mass index is 38.45 kg/(m^2). Ideal Body Weight: Weight in (lb) to have BMI = 25: 140.8  GEN: WDWN, NAD, Non-toxic, A & O x 3, obese, looks well  HEENT: Atraumatic, Normocephalic. Neck supple. No masses, No LAD. Ears and Nose: No external deformity. CV: RRR, No M/G/R. No JVD. No thrill. No extra heart sounds. PULM: CTA B, no wheezes, crackles, rhonchi. No retractions. No resp. distress. No accessory muscle use. ABD: S, NT, ND, +BS. No rebound. No HSM.  She does have a pannus that hangs over the pubic symphysis.  Under the panus is an area of mildly macerated and irritated skin c/w candida intertrigo EXTR: No c/c/e NEURO Normal gait.  PSYCH: Normally interactive. Conversant. Not depressed or anxious appearing.  Calm demeanor.    Assessment and Plan: Candidal intertrigo - Plan: fluconazole (DIFLUCAN) 150 MG tablet  Need for hepatitis C screening test - Plan: Hepatitis C antibody  Screening for diabetes mellitus - Plan: Hemoglobin A1c  Immunization due - Plan: Tdap vaccine greater than or equal to 7yo IM  Abdominal pannus - Plan: Ambulatory referral to General Surgery   Continue weekly diflucan as needed for rash Will check her A1c today- if still borderline metformin may help to relieve her rash tdap updated today Referral to general surgery to discuss possible tummy tuck  Signed Lamar Blinks, MD

## 2016-02-04 NOTE — Telephone Encounter (Signed)
Erica from [facility] says that they received a referral for the pt for a tummy tuck. She says that their office doesn't do tummy tucks and that the pt would need to have referral sent to a plastic surgeon instead.

## 2016-02-04 NOTE — Progress Notes (Signed)
Pre visit review using our clinic review tool, if applicable. No additional management support is needed unless otherwise documented below in the visit note. 

## 2016-02-04 NOTE — Patient Instructions (Signed)
I will make a referral to plastic or general surgery for you-  I will see who most does the tummy tuck procedure  Use the diflucan once a week as needed to control the fungal infection  You got your tetanus booster today and your hepatitis C screening.  We will also screen for diabetes

## 2016-02-04 NOTE — Addendum Note (Signed)
Addended by: Lamar Blinks C on: 02/04/2016 06:27 PM   Modules accepted: Orders

## 2016-02-05 ENCOUNTER — Encounter: Payer: Self-pay | Admitting: Family Medicine

## 2016-02-05 DIAGNOSIS — R7303 Prediabetes: Secondary | ICD-10-CM | POA: Insufficient documentation

## 2016-02-05 LAB — HEPATITIS C ANTIBODY: HCV Ab: NEGATIVE

## 2016-02-05 MED ORDER — DICLOFENAC SODIUM 1 % TD GEL: 2.0000 g | Freq: Four times a day (QID) | TRANSDERMAL | Status: DC

## 2016-02-10 ENCOUNTER — Encounter: Payer: Self-pay | Admitting: Family Medicine

## 2016-02-10 DIAGNOSIS — R7303 Prediabetes: Secondary | ICD-10-CM

## 2016-02-10 MED ORDER — METFORMIN HCL 500 MG PO TABS: 500.0000 mg | ORAL_TABLET | Freq: Every day | ORAL | Status: DC

## 2016-03-02 ENCOUNTER — Encounter: Payer: Self-pay | Admitting: Family Medicine

## 2016-03-18 ENCOUNTER — Other Ambulatory Visit: Payer: Self-pay | Admitting: Family Medicine

## 2016-03-18 DIAGNOSIS — M545 Low back pain, unspecified: Secondary | ICD-10-CM

## 2016-03-18 NOTE — Telephone Encounter (Signed)
Called pt- I am not in the office today but will do for her on Monday.  Will also send mychart

## 2016-03-21 ENCOUNTER — Encounter: Payer: Self-pay | Admitting: Family Medicine

## 2016-03-21 MED ORDER — OXYCODONE-ACETAMINOPHEN 5-325 MG PO TABS: 1.0000 | ORAL_TABLET | Freq: Three times a day (TID) | ORAL | Status: DC | PRN

## 2016-03-21 NOTE — Telephone Encounter (Signed)
Printed rx today- note that reprint appears 3x due to computer error

## 2016-03-21 NOTE — Addendum Note (Signed)
Addended by: Lamar Blinks C on: 03/21/2016 09:40 AM   Modules accepted: Orders

## 2016-03-21 NOTE — Addendum Note (Signed)
Addended by: Lamar Blinks C on: 03/21/2016 09:13 AM   Modules accepted: Orders

## 2016-04-30 ENCOUNTER — Other Ambulatory Visit: Payer: Self-pay | Admitting: Internal Medicine

## 2016-05-02 ENCOUNTER — Other Ambulatory Visit: Payer: Self-pay | Admitting: *Deleted

## 2016-06-03 ENCOUNTER — Other Ambulatory Visit: Payer: Self-pay

## 2016-06-03 MED ORDER — CARVEDILOL 3.125 MG PO TABS: 3.1250 mg | ORAL_TABLET | Freq: Two times a day (BID) | ORAL | Status: DC

## 2016-06-28 ENCOUNTER — Encounter: Payer: Self-pay | Admitting: Physician Assistant

## 2016-06-28 ENCOUNTER — Ambulatory Visit (INDEPENDENT_AMBULATORY_CARE_PROVIDER_SITE_OTHER): Payer: BC Managed Care – PPO | Admitting: Physician Assistant

## 2016-06-28 VITALS — BP 142/82 | HR 78 | Temp 97.8°F | Resp 16 | Wt 214.4 lb

## 2016-06-28 DIAGNOSIS — R6889 Other general symptoms and signs: Secondary | ICD-10-CM

## 2016-06-28 DIAGNOSIS — H578 Other specified disorders of eye and adnexa: Secondary | ICD-10-CM | POA: Diagnosis not present

## 2016-06-28 MED ORDER — OLOPATADINE HCL 0.2 % OP SOLN: 1.0000 [drp] | Freq: Every day | OPHTHALMIC | 3 refills | Status: DC | PRN

## 2016-06-28 NOTE — Patient Instructions (Signed)
Please place the drops in your eyes daily for eye itching.  Place vaseline around your eyes as needed for skin irriation.

## 2016-06-28 NOTE — Progress Notes (Signed)
06/28/2016 2:38 PM   DOB: 1951/09/19 / MRN: [id number]  SUBJECTIVE:  [name] is a 65 y.o. female with a history of well controlled HTN and well controlled pre diabetes presenting for itchy eyes x 1 month now. She associates clear tearing and itching about the lids as well.  She denies changes in vision, frank eye pain, photophobia, nausea and HA.  She has tried washing her eyes out with "home remedies" with some relief however the symptoms return.  She feels no worse or better since the onset of the illness. She wears glasses.   She is allergic to botox [botulinum toxin type a]; hydrochlorothiazide; and toprol xl [metoprolol succinate].   She  has a past medical history of Allergy; Arthritis; Depression; Dyslipidemia; GERD (gastroesophageal reflux disease); Headaches due to old head injury; Hypertension; Obesity; Reflux; and Thyroid disease.    She  reports that she has never smoked. She has never used smokeless tobacco. She reports that she drinks about 0.5 - 1.0 oz of alcohol per week . She reports that she does not use drugs. She  has no sexual activity history on file. The patient  has a past surgical history that includes Facet joint injection (2011 and 2012); Breast surgery (2003); Thyroidectomy (1989); Tubal ligation; Abdominal hysterectomy; Myomectomy; Cesarean section; transthoracic echocardiogram (03/2009); and NM MYOCAR [place] (03/2009).  Her family history includes ADD / ADHD in her son; Depression in her daughter; Diabetes in her father; Glaucoma in her father; Heart attack in her paternal uncle; Heart disease in her maternal grandfather, maternal grandmother, mother, paternal grandfather, and paternal grandmother; Hypertension in her mother and paternal uncle; Stroke in her maternal grandfather and paternal grandmother; Thyroid disease in her mother and sister.  Review of Systems  Constitutional: Negative for chills and fever.  Respiratory: Negative for cough.     Skin: Positive for itching. Negative for rash.  Neurological: Negative for dizziness.    The problem list and medications were reviewed and updated by myself where necessary and exist elsewhere in the encounter.   OBJECTIVE:  BP (!) 142/82 (BP Location: Right Arm, Patient Position: Sitting, Cuff Size: Large)   Pulse 78   Temp 97.8 F (36.6 C) (Oral)   Resp 16   Wt 214 lb 6.4 oz (97.3 kg)   SpO2 97%   BMI 37.98 kg/m   Physical Exam  Constitutional: She is oriented to person, place, and time. She appears well-nourished. No distress.  Eyes: EOM are normal. Pupils are equal, round, and reactive to light. Right eye exhibits no discharge, no exudate and no hordeolum. Left eye exhibits no discharge, no exudate and no hordeolum. Right conjunctiva is not injected. Right conjunctiva has no hemorrhage. Left conjunctiva is not injected. Left conjunctiva has no hemorrhage.  Fundoscopic exam:      The right eye shows no exudate and no hemorrhage. The right eye shows red reflex.       The left eye shows no exudate and no hemorrhage. The left eye shows red reflex.  Cardiovascular: Normal rate.   Pulmonary/Chest: Effort normal.  Abdominal: She exhibits no distension.  Neurological: She is alert and oriented to person, place, and time. No cranial nerve deficit. Gait normal.  Skin: Skin is dry. She is not diaphoretic.  Psychiatric: She has a normal mood and affect.  Vitals reviewed.   Lab Results  Component Value Date   HGBA1C 5.9 02/04/2016    No results found for this or any previous visit (from  the past 72 hour(s)).  No results found.  ASSESSMENT AND PLAN  [name] was seen today for pressure behind the eyes.  Diagnoses and all orders for this visit:  Itchy eyes: No red flags.  Will try her on pataday.  If she does not see improvement she will call and I will send her to Dr. Katy Fitch.  -     Olopatadine HCl (PATADAY) 0.2 % SOLN; Apply 1 drop to eye daily as needed (for  itching).    The patient is advised to call or return to clinic if she does not see an improvement in symptoms, or to seek the care of the closest emergency department if she worsens with the above plan.   Philis Fendt, MHS, PA-C [facility] 06/28/2016 2:38 PM

## 2016-07-06 ENCOUNTER — Encounter: Payer: Self-pay | Admitting: Family Medicine

## 2016-07-06 ENCOUNTER — Telehealth: Payer: Self-pay | Admitting: Family Medicine

## 2016-07-06 ENCOUNTER — Encounter: Payer: Self-pay | Admitting: Emergency Medicine

## 2016-07-06 ENCOUNTER — Telehealth: Payer: Self-pay | Admitting: Emergency Medicine

## 2016-07-06 ENCOUNTER — Other Ambulatory Visit: Payer: Self-pay | Admitting: Emergency Medicine

## 2016-07-06 ENCOUNTER — Other Ambulatory Visit: Payer: Self-pay | Admitting: Radiology

## 2016-07-06 NOTE — Telephone Encounter (Signed)
Janett Billow from [facility] [phone], fax [phone] Needing order for US Guided breast bx of the left breast, please fax. They have requested 2x.

## 2016-07-06 NOTE — Telephone Encounter (Signed)
Called [name] back- I am glad to do this, does she need to send me a form to fax back? I will put an order on a letterhead and fax it to them but not sure if this will suffice

## 2016-07-06 NOTE — Telephone Encounter (Signed)
Ultrasound order faxed Attn: Janett Billow at [facility] fax [phone].

## 2016-07-12 ENCOUNTER — Encounter: Payer: Self-pay | Admitting: Family Medicine

## 2016-07-19 ENCOUNTER — Other Ambulatory Visit: Payer: Self-pay | Admitting: General Surgery

## 2016-07-19 DIAGNOSIS — N6310 Unspecified lump in the right breast, unspecified quadrant: Secondary | ICD-10-CM

## 2016-07-25 ENCOUNTER — Encounter: Payer: Self-pay | Admitting: Family Medicine

## 2016-08-02 ENCOUNTER — Ambulatory Visit (INDEPENDENT_AMBULATORY_CARE_PROVIDER_SITE_OTHER): Payer: BC Managed Care – PPO | Admitting: Internal Medicine

## 2016-08-02 ENCOUNTER — Encounter: Payer: Self-pay | Admitting: Internal Medicine

## 2016-08-02 VITALS — BP 139/82 | HR 57 | Ht 63.0 in | Wt 219.2 lb

## 2016-08-02 DIAGNOSIS — E785 Hyperlipidemia, unspecified: Secondary | ICD-10-CM

## 2016-08-02 DIAGNOSIS — E669 Obesity, unspecified: Secondary | ICD-10-CM

## 2016-08-02 DIAGNOSIS — I10 Essential (primary) hypertension: Secondary | ICD-10-CM

## 2016-08-02 NOTE — Patient Instructions (Signed)
Your physician recommends that you return for lab work FASTING to check cholesterol   Your physician wants you to follow-up in: ONE YEAR with Dr. Hilty. You will receive a reminder letter in the mail two months in advance. If you don't receive a letter, please call our office to schedule the follow-up appointment.   

## 2016-08-02 NOTE — Progress Notes (Signed)
OFFICE NOTE  Chief Complaint:  No complaints  Primary Care Physician: Lamar Blinks, MD  HPI:  ARRYN Barr is a pleasant 65 year old female with a strong family history of heart disease. Her mother died of a sudden heart attack at age 58, her both grandparents had heart disease and a grandmother had a stroke at age 26. She was seen remotely by cardiologist more than 3 years ago and had stress testing at that time which was negative. Back in November she was complaining of chest pain which was intermittent. It was somewhat worse at that time however she had an active bronchitis and was treated. Her symptoms improved somewhat however she still had some chest pain since that time. Some of it is associated with exertion and relieved by rest and other times it comes fairly randomly. It does feel like a squeezing sensation in the upper chest it does not radiate to the neck, arm or back. She describes the discomfort as a 5 or 6/10 in severity.  She occasionally takes nitroglycerin for this however her old prescription was only recently renewed.  She is desiring to start an exercise program and would like cardiovascular evaluation and risk stratification prior to that.  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  [name] had a recent nuclear stress test which was negative.  This demonstrated no reversible ischemia. She also had a lipid NMR performed, which demonstrated elevated LDL cholesterol of 117, with elevated LDL particle numbers to 1347.  Her contents a metabolic profile was normal.  Her blood pressure today is much improved. She denies any further chest pain and only occasionally has migraine headaches. We have not assessed her cholesterol since her last lipid profile and she's not on treatment for  her elevated cholesterol.  I saw [name] back in the office today. Her main complaint is some discomfort over the right trapezius area. She says this is usually worse with stress and tension. She denies any chest pain per se today. Recently her headaches have improved somewhat. Her cholesterol has been fairly well-controlled. Her blood pressure is at goal today. Unfortunate she's not managed to lose much weight and remains close to morbid obesity with BMI of 38.  08/02/2016  Returns today for follow-up. Over the past year she's had no complaints. Recently she was found to have a breast mass and is undergoing a biopsy later this month. She's not had a recent cholesterol check in the past year but had a fairly good cholesterol profile last saw her. She is continue to work on weight loss. She's on medication for depression which she says is working well. Her only issue right now is memory loss.  PMHx:  Past Medical History:  Diagnosis Date  . Allergy   . Arthritis   . Depression   . Dyslipidemia   . GERD (gastroesophageal reflux disease)   . Headaches due to old head injury   . Hypertension   . Obesity   . Reflux   . Thyroid disease     Past Surgical History:  Procedure Laterality Date  . ABDOMINAL HYSTERECTOMY    . BREAST SURGERY  2003   reduction  . CESAREAN SECTION    . FACET JOINT INJECTION  2011 and 2012  . MYOMECTOMY    . NM [facility]  PERF WALL MOTION  03/2009   dipyridamole; normal pattern of perfusion in all regions, post-stress EF 65%, normal study   . THYROIDECTOMY  1989  . TRANSTHORACIC ECHOCARDIOGRAM  03/2009   EF=>55%, borderline conc LVH; mild mitral annular calcif, trace MR; trace TR; AV mildly sclerotic, mild AV regurg; mild pulm valve regurg   . TUBAL LIGATION      FAMHx:  Family History  Problem Relation Age of Onset  . Heart disease Mother     MI, HTN  . Hypertension Mother   . Thyroid disease Mother   . Heart disease Maternal Grandmother     MI, CVA    . Heart disease Maternal Grandfather     MI  . Stroke Maternal Grandfather   . Heart disease Paternal Grandmother   . Stroke Paternal Grandmother   . Heart disease Paternal Grandfather     MI  . Glaucoma Father   . Diabetes Father   . Depression Daughter   . ADD / ADHD Son   . Thyroid disease Sister   . Heart attack Paternal Uncle     HTN  . Hypertension Paternal Uncle     SOCHx:   reports that she has never smoked. She has never used smokeless tobacco. She reports that she drinks about 0.5 - 1.0 oz of alcohol per week . She reports that she does not use drugs.  ALLERGIES:  Allergies  Allergen Reactions  . Metoprolol Hives  . Onabotulinumtoxina (Cosmetic) Swelling  . Botox [Botulinum Toxin Type A]   . Hydrochlorothiazide Other (See Comments)    Increases risk of gout flares  . Toprol Xl [Metoprolol Succinate] Hives    ROS: Pertinent items noted in HPI and remainder of comprehensive ROS otherwise negative.  HOME MEDS: Current Outpatient Prescriptions  Medication Sig Dispense Refill  . ALPRAZolam (XANAX) 0.25 MG tablet Take 0.25 mg by mouth 2 (two) times daily as needed for sleep.    Marland Kitchen amLODipine (NORVASC) 10 MG tablet TAKE 1 TABLET BY MOUTH DAILY 30 tablet 4  . aspirin 81 MG tablet Take 81 mg by mouth daily.    . butalbital-acetaminophen-caffeine (FIORICET WITH CODEINE) 50-325-40-30 MG per capsule Take 1 capsule by mouth every 4 (four) hours as needed.    . carvedilol (COREG) 3.125 MG tablet Take 1 tablet (3.125 mg total) by mouth 2 (two) times daily. 60 tablet 2  . desvenlafaxine (PRISTIQ) 100 MG 24 hr tablet Take 100 mg by mouth daily.    . Diclofenac Potassium (CAMBIA PO) Take 50 mg by mouth as needed.     . diclofenac sodium (VOLTAREN) 1 % GEL Apply 2 g topically 4 (four) times daily. 100 g 3  . Eszopiclone (ESZOPICLONE) 3 MG TABS Take 3 mg by mouth at bedtime. Take immediately before bedtime    . fluconazole (DIFLUCAN) 150 MG tablet Take 1 tablet (150 mg total) by  mouth once. Repeat weekly as needed 8 tablet 1  . frovatriptan (FROVA) 2.5 MG tablet Take 2.5 mg by mouth as needed for migraine. If recurs, may repeat after 2 hours. Max of 3 tabs in 24 hours.    . gabapentin (NEURONTIN) 300 MG capsule Take 300 mg by mouth 3 (three) times daily.    Marland Kitchen HYDROcodone-acetaminophen (NORCO) 7.5-325 MG per tablet Take 1 tablet by mouth 2 (two) times daily as needed for pain.    Marland Kitchen ipratropium (ATROVENT) 0.03 % nasal spray Place 2 sprays into the nose 4 (four) times daily. 30 mL 1  .  nitroGLYCERIN (NITROSTAT) 0.4 MG SL tablet Place 1 tablet (0.4 mg total) under the tongue every 5 (five) minutes as needed. 30 tablet 0  . Olopatadine HCl (PATADAY) 0.2 % SOLN Apply 1 drop to eye daily as needed (for itching). 2.5 mL 3  . omeprazole (PRILOSEC) 40 MG capsule Take 40 mg by mouth daily.    . valsartan (DIOVAN) 320 MG tablet TAKE 1 TABLET BY MOUTH EVERY DAY 30 tablet 10   No current facility-administered medications for this visit.     LABS/IMAGING: No results found for this or any previous visit (from the past 48 hour(s)). No results found.  VITALS: BP 139/82   Pulse (!) 57   Ht 5\' 3"  (1.6 m)   Wt 219 lb 3.2 oz (99.4 kg)   BMI 38.83 kg/m   EXAM: General appearance: alert and no distress Neck: no carotid bruit and no JVD Lungs: clear to auscultation bilaterally Heart: regular rate and rhythm, S1, S2 normal, no murmur, click, rub or gallop Abdomen: soft, non-tender; bowel sounds normal; no masses,  no organomegaly Extremities: extremities normal, atraumatic, no cyanosis or edema Pulses: 2+ and symmetric Skin: Skin color, texture, turgor normal. No rashes or lesions Neurologic: Grossly normal  EKG: Sinus bradycardia with sinus arrhythmia at 57  ASSESSMENT: 1. Dyslipidemia - last LDL 130 in 01/2013, not on statin 2. Hypertension - controlled 3. Obesity 4. History of TBI with headaches and memory loss 5. Strong family history of premature coronary artery  disease  PLAN: 1.   Mrs. Sime is doing well unfortunately was found to have a small breast mass. She will undergo biopsy this month. Blood pressure appears to be well-controlled. Cholesterol is due for repeat check. Memory is been an issue but her headaches have improved. Her depression is also improved on medication. Follow-up annually or sooner as necessary. We'll contact her with results of her cholesterol profile.  Pixie Casino, MD, [facility] Attending Cardiologist [place] 08/02/2016, 10:16 AM

## 2016-08-08 LAB — LIPID PANEL
CHOL/HDL RATIO: 3 ratio (ref <=5.0)
CHOLESTEROL: 211 mg/dL — AB (ref 125–200)
HDL: 70 mg/dL (ref >=46)
LDL Cholesterol: 124 mg/dL (ref <130)
TRIGLYCERIDES: 85 mg/dL (ref <150)
VLDL: 17 mg/dL (ref <30)

## 2016-08-15 ENCOUNTER — Encounter (HOSPITAL_BASED_OUTPATIENT_CLINIC_OR_DEPARTMENT_OTHER): Payer: Self-pay | Admitting: *Deleted

## 2016-08-22 ENCOUNTER — Encounter: Payer: Self-pay | Admitting: General Surgery

## 2016-08-22 NOTE — Progress Notes (Signed)
Pt given a 8 oz carton of boost breeze with verbal and written instructions to drink by 530 morning of surgery and no other liquid after midnight teach back , pt voiced understanding

## 2016-08-23 ENCOUNTER — Ambulatory Visit (HOSPITAL_BASED_OUTPATIENT_CLINIC_OR_DEPARTMENT_OTHER): Payer: BC Managed Care – PPO | Admitting: Anesthesiology

## 2016-08-23 ENCOUNTER — Encounter (HOSPITAL_BASED_OUTPATIENT_CLINIC_OR_DEPARTMENT_OTHER): Payer: Self-pay

## 2016-08-23 ENCOUNTER — Encounter (HOSPITAL_BASED_OUTPATIENT_CLINIC_OR_DEPARTMENT_OTHER)
Admission: RE | Disposition: A | Discharge status: Home / Self Care | Payer: Self-pay | Source: Ambulatory Visit | Attending: General Surgery

## 2016-08-23 ENCOUNTER — Ambulatory Visit (HOSPITAL_BASED_OUTPATIENT_CLINIC_OR_DEPARTMENT_OTHER)
Admission: RE | Admit: 2016-08-23 | Discharge: 2016-08-23 | Disposition: A | Discharge status: Home / Self Care | Payer: BC Managed Care – PPO | Source: Ambulatory Visit | Attending: General Surgery | Admitting: General Surgery

## 2016-08-23 DIAGNOSIS — Z79899 Other long term (current) drug therapy: Secondary | ICD-10-CM | POA: Diagnosis not present

## 2016-08-23 DIAGNOSIS — Z6838 Body mass index (BMI) 38.0-38.9, adult: Secondary | ICD-10-CM | POA: Insufficient documentation

## 2016-08-23 DIAGNOSIS — N63 Unspecified lump in breast: Secondary | ICD-10-CM | POA: Diagnosis present

## 2016-08-23 DIAGNOSIS — K219 Gastro-esophageal reflux disease without esophagitis: Secondary | ICD-10-CM | POA: Diagnosis not present

## 2016-08-23 DIAGNOSIS — D242 Benign neoplasm of left breast: Secondary | ICD-10-CM | POA: Insufficient documentation

## 2016-08-23 DIAGNOSIS — I10 Essential (primary) hypertension: Secondary | ICD-10-CM | POA: Insufficient documentation

## 2016-08-23 DIAGNOSIS — Z7982 Long term (current) use of aspirin: Secondary | ICD-10-CM | POA: Diagnosis not present

## 2016-08-23 DIAGNOSIS — F419 Anxiety disorder, unspecified: Secondary | ICD-10-CM | POA: Diagnosis not present

## 2016-08-23 DIAGNOSIS — E669 Obesity, unspecified: Secondary | ICD-10-CM | POA: Diagnosis not present

## 2016-08-23 DIAGNOSIS — R51 Headache: Secondary | ICD-10-CM | POA: Diagnosis not present

## 2016-08-23 HISTORY — PX: BREAST LUMPECTOMY WITH RADIOACTIVE SEED LOCALIZATION: SHX6424

## 2016-08-23 SURGERY — BREAST LUMPECTOMY WITH RADIOACTIVE SEED LOCALIZATION
Anesthesia: General | Site: Breast | Laterality: Left

## 2016-08-23 MED ORDER — ONDANSETRON HCL 4 MG/2ML IJ SOLN
INTRAMUSCULAR | Status: AC
  Filled 2016-08-23: qty 2

## 2016-08-23 MED ORDER — OXYCODONE HCL 5 MG/5ML PO SOLN: 5.0000 mg | Freq: Once | ORAL | Status: DC | PRN

## 2016-08-23 MED ORDER — PROPOFOL 10 MG/ML IV BOLUS
INTRAVENOUS | Status: AC
  Filled 2016-08-23: qty 20

## 2016-08-23 MED ORDER — DEXAMETHASONE SODIUM PHOSPHATE 10 MG/ML IJ SOLN
INTRAMUSCULAR | Status: AC
  Filled 2016-08-23: qty 1

## 2016-08-23 MED ORDER — ONDANSETRON HCL 4 MG/2ML IJ SOLN: 4.0000 mg | Freq: Once | INTRAMUSCULAR | Status: DC | PRN

## 2016-08-23 MED ORDER — PROPOFOL 500 MG/50ML IV EMUL
INTRAVENOUS | Status: DC | PRN
  Administered 2016-08-23: 140 mL via INTRAVENOUS

## 2016-08-23 MED ORDER — CEFAZOLIN SODIUM-DEXTROSE 2-4 GM/100ML-% IV SOLN
2.0000 g | INTRAVENOUS | Status: AC
  Administered 2016-08-23: 2 g via INTRAVENOUS

## 2016-08-23 MED ORDER — GABAPENTIN 300 MG PO CAPS
ORAL_CAPSULE | ORAL | Status: AC
  Filled 2016-08-23: qty 1

## 2016-08-23 MED ORDER — MIDAZOLAM HCL 2 MG/2ML IJ SOLN: 1.0000 mg | INTRAMUSCULAR | Status: DC | PRN

## 2016-08-23 MED ORDER — BUPIVACAINE HCL (PF) 0.25 % IJ SOLN
INTRAMUSCULAR | Status: DC | PRN
Start: 2016-08-23 — End: 2016-08-23
  Administered 2016-08-23: 10 mL

## 2016-08-23 MED ORDER — FENTANYL CITRATE (PF) 100 MCG/2ML IJ SOLN: 25.0000 ug | INTRAMUSCULAR | Status: DC | PRN

## 2016-08-23 MED ORDER — FENTANYL CITRATE (PF) 100 MCG/2ML IJ SOLN
50.0000 ug | INTRAMUSCULAR | Status: DC | PRN
  Administered 2016-08-23: 50 ug via INTRAVENOUS

## 2016-08-23 MED ORDER — ARTIFICIAL TEARS OP OINT
TOPICAL_OINTMENT | OPHTHALMIC | Status: AC
  Filled 2016-08-23: qty 3.5

## 2016-08-23 MED ORDER — SCOPOLAMINE 1 MG/3DAYS TD PT72
1.0000 | MEDICATED_PATCH | Freq: Once | TRANSDERMAL | Status: DC | PRN
Start: 2016-08-23 — End: 2016-08-23

## 2016-08-23 MED ORDER — ONDANSETRON HCL 4 MG/2ML IJ SOLN
INTRAMUSCULAR | Status: DC | PRN
  Administered 2016-08-23: 4 mg via INTRAVENOUS

## 2016-08-23 MED ORDER — MIDAZOLAM HCL 2 MG/2ML IJ SOLN
INTRAMUSCULAR | Status: AC
  Filled 2016-08-23: qty 2

## 2016-08-23 MED ORDER — GLYCOPYRROLATE 0.2 MG/ML IJ SOLN: 0.2000 mg | Freq: Once | INTRAMUSCULAR | Status: DC | PRN

## 2016-08-23 MED ORDER — GABAPENTIN 300 MG PO CAPS
300.0000 mg | ORAL_CAPSULE | ORAL | Status: AC
  Administered 2016-08-23: 300 mg via ORAL

## 2016-08-23 MED ORDER — OXYCODONE HCL 5 MG PO TABS: 5.0000 mg | ORAL_TABLET | Freq: Once | ORAL | Status: DC | PRN

## 2016-08-23 MED ORDER — LIDOCAINE HCL (CARDIAC) 20 MG/ML IV SOLN
INTRAVENOUS | Status: DC | PRN
  Administered 2016-08-23: 100 mg via INTRAVENOUS

## 2016-08-23 MED ORDER — DEXAMETHASONE SODIUM PHOSPHATE 10 MG/ML IJ SOLN
INTRAMUSCULAR | Status: DC | PRN
  Administered 2016-08-23: 10 mg via INTRAVENOUS

## 2016-08-23 MED ORDER — CEFAZOLIN SODIUM-DEXTROSE 2-4 GM/100ML-% IV SOLN
INTRAVENOUS | Status: AC
  Filled 2016-08-23: qty 100

## 2016-08-23 MED ORDER — ACETAMINOPHEN 500 MG PO TABS
1000.0000 mg | ORAL_TABLET | ORAL | Status: AC
  Administered 2016-08-23: 1000 mg via ORAL

## 2016-08-23 MED ORDER — ACETAMINOPHEN 500 MG PO TABS
ORAL_TABLET | ORAL | Status: AC
  Filled 2016-08-23: qty 2

## 2016-08-23 MED ORDER — LACTATED RINGERS IV SOLN
INTRAVENOUS | Status: DC
  Administered 2016-08-23: 08:00:00 via INTRAVENOUS

## 2016-08-23 MED ORDER — FENTANYL CITRATE (PF) 100 MCG/2ML IJ SOLN
INTRAMUSCULAR | Status: AC
  Filled 2016-08-23: qty 2

## 2016-08-23 SURGICAL SUPPLY — 55 items
ADH SKN CLS APL DERMABOND .7 (GAUZE/BANDAGES/DRESSINGS) ×1
BINDER BREAST LRG (GAUZE/BANDAGES/DRESSINGS) IMPLANT
BINDER BREAST MEDIUM (GAUZE/BANDAGES/DRESSINGS) IMPLANT
BINDER BREAST XLRG (GAUZE/BANDAGES/DRESSINGS) IMPLANT
BINDER BREAST XXLRG (GAUZE/BANDAGES/DRESSINGS) IMPLANT
BLADE SURG 15 STRL LF DISP TIS (BLADE) ×1 IMPLANT
BLADE SURG 15 STRL SS (BLADE) ×3
CANISTER SUC SOCK COL 7IN (MISCELLANEOUS) IMPLANT
CANISTER SUCT 1200ML W/VALVE (MISCELLANEOUS) IMPLANT
CHLORAPREP W/TINT 26ML (MISCELLANEOUS) ×3 IMPLANT
CLIP TI WIDE RED SMALL 6 (CLIP) IMPLANT
CLOSURE WOUND 1/2 X4 (GAUZE/BANDAGES/DRESSINGS) ×1
COVER BACK TABLE 60X90IN (DRAPES) ×3 IMPLANT
COVER MAYO STAND STRL (DRAPES) ×3 IMPLANT
COVER PROBE W GEL 5X96 (DRAPES) ×3 IMPLANT
DECANTER SPIKE VIAL GLASS SM (MISCELLANEOUS) IMPLANT
DERMABOND ADVANCED (GAUZE/BANDAGES/DRESSINGS) ×2
DERMABOND ADVANCED .7 DNX12 (GAUZE/BANDAGES/DRESSINGS) ×1 IMPLANT
DEVICE DUBIN W/COMP PLATE 8390 (MISCELLANEOUS) ×3 IMPLANT
DRAPE LAPAROSCOPIC ABDOMINAL (DRAPES) ×3 IMPLANT
DRAPE UTILITY XL STRL (DRAPES) ×3 IMPLANT
DRSG TEGADERM 4X4.75 (GAUZE/BANDAGES/DRESSINGS) IMPLANT
ELECT COATED BLADE 2.86 ST (ELECTRODE) ×3 IMPLANT
ELECT REM PT RETURN 9FT ADLT (ELECTROSURGICAL) ×3
ELECTRODE REM PT RTRN 9FT ADLT (ELECTROSURGICAL) ×1 IMPLANT
GLOVE BIO SURGEON STRL SZ7 (GLOVE) ×6 IMPLANT
GLOVE BIOGEL PI IND STRL 7.5 (GLOVE) ×1 IMPLANT
GLOVE BIOGEL PI INDICATOR 7.5 (GLOVE) ×2
GOWN STRL REUS W/ TWL LRG LVL3 (GOWN DISPOSABLE) ×2 IMPLANT
GOWN STRL REUS W/TWL LRG LVL3 (GOWN DISPOSABLE) ×6
ILLUMINATOR WAVEGUIDE N/F (MISCELLANEOUS) IMPLANT
KIT MARKER MARGIN INK (KITS) ×3 IMPLANT
LIGHT WAVEGUIDE WIDE FLAT (MISCELLANEOUS) IMPLANT
NEEDLE HYPO 25X1 1.5 SAFETY (NEEDLE) ×3 IMPLANT
NS IRRIG 1000ML POUR BTL (IV SOLUTION) IMPLANT
PACK BASIN DAY SURGERY FS (CUSTOM PROCEDURE TRAY) ×3 IMPLANT
PENCIL BUTTON HOLSTER BLD 10FT (ELECTRODE) ×3 IMPLANT
SLEEVE SCD COMPRESS KNEE MED (MISCELLANEOUS) ×3 IMPLANT
SPONGE GAUZE 4X4 12PLY STER LF (GAUZE/BANDAGES/DRESSINGS) IMPLANT
SPONGE LAP 4X18 X RAY DECT (DISPOSABLE) ×3 IMPLANT
STRIP CLOSURE SKIN 1/2X4 (GAUZE/BANDAGES/DRESSINGS) ×2 IMPLANT
SUT MNCRL AB 4-0 PS2 18 (SUTURE) ×3 IMPLANT
SUT MON AB 5-0 PS2 18 (SUTURE) IMPLANT
SUT SILK 2 0 SH (SUTURE) IMPLANT
SUT VIC AB 2-0 SH 27 (SUTURE) ×3
SUT VIC AB 2-0 SH 27XBRD (SUTURE) ×1 IMPLANT
SUT VIC AB 3-0 SH 27 (SUTURE) ×3
SUT VIC AB 3-0 SH 27X BRD (SUTURE) ×1 IMPLANT
SUT VIC AB 5-0 PS2 18 (SUTURE) IMPLANT
SYR CONTROL 10ML LL (SYRINGE) ×3 IMPLANT
TOWEL OR 17X24 6PK STRL BLUE (TOWEL DISPOSABLE) ×3 IMPLANT
TOWEL OR NON WOVEN STRL DISP B (DISPOSABLE) ×3 IMPLANT
TUBE CONNECTING 20'X1/4 (TUBING)
TUBE CONNECTING 20X1/4 (TUBING) IMPLANT
YANKAUER SUCT BULB TIP NO VENT (SUCTIONS) IMPLANT

## 2016-08-23 NOTE — H&P (Signed)
65 yof with prior history of breast reduction presents after screening mm shows a left breast mass. she is referred by Dr Christene Slates. she had no mass or dc. no personal history of breast disease. she has fh in first cousin and ggm of breast cancer.there is new mass in left breast. US shows a dilated duct in this region. 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 she is here to discuss options   Other Problems  Anxiety Disorder Arthritis Back Pain Gastroesophageal Reflux Disease High blood pressure Thyroid Disease  Past Surgical History  Breast Biopsy Left. Cesarean Section - Multiple Foot Surgery Right. Hysterectomy (not due to cancer) - Partial Lung Surgery Left. Mammoplasty; Reduction Bilateral. Thyroid Surgery  Diagnostic Studies History  Colonoscopy 1-5 years ago Mammogram within last year Pap Smear 1-5 years ago  Allergies Botulinum Toxin Type A (Cosm) *DERMATOLOGICALS*  Medication History Desvenlafaxine Succinate ER (100MG  Tablet ER 24HR, Oral) Active. Fluconazole (150MG  Tablet, Oral) Active. Diclofenac Sodium (1% Gel, Transdermal) Active. Carvedilol (3.125MG  Tablet, Oral) Active. AmLODIPine Besylate (10MG  Tablet, Oral) Active. ALPRAZolam (0.25MG  Tablet, Oral) Active. Valtrex (1GM Tablet, Oral) Active. Nitroglycerin (0.4MG  Tab Sublingual, Sublingual) Active. Atrovent (0.03% Solution, Nasal) Active. Valsartan (320MG  Tablet, Oral) Active. Aspirin (81MG  Tablet, Oral) Active. Gabapentin (300MG  Tablet, Oral) Active. PriLOSEC (40MG  Capsule DR, Oral) Active. Medications Reconciled  Social History Alcohol use Moderate alcohol use. Caffeine use Carbonated beverages, Coffee, Tea. No drug use Tobacco use Never smoker.  Family History Heart Disease Mother. Thyroid problems Mother.  Pregnancy / Birth History  Age at menarche 18 years. Age of menopause <46 Contraceptive History Intrauterine  device, Oral contraceptives. Gravida 2 Length (months) of breastfeeding 3-6 Maternal age 69-30 Para 2  Review of Systems  General Present- Night Sweats. Not Present- Appetite Loss, Chills, Fatigue, Fever, Weight Gain and Weight Loss. HEENT Present- Seasonal Allergies. Not Present- Earache, Hearing Loss, Hoarseness, Nose Bleed, Oral Ulcers, Ringing in the Ears, Sinus Pain, Sore Throat, Visual Disturbances, Wears glasses/contact lenses and Yellow Eyes. Respiratory Not Present- Bloody sputum, Chronic Cough, Difficulty Breathing, Snoring and Wheezing. Breast Present- Breast Mass. Not Present- Breast Pain, Nipple Discharge and Skin Changes. Cardiovascular Not Present- Chest Pain, Difficulty Breathing Lying Down, Leg Cramps, Palpitations, Rapid Heart Rate, Shortness of Breath and Swelling of Extremities. Gastrointestinal Not Present- Abdominal Pain, Bloating, Bloody Stool, Change in Bowel Habits, Chronic diarrhea, Constipation, Difficulty Swallowing, Excessive gas, Gets full quickly at meals, Hemorrhoids, Indigestion, Nausea, Rectal Pain and Vomiting. Female Genitourinary Present- Nocturia. Not Present- Frequency, Painful Urination, Pelvic Pain and Urgency. Musculoskeletal Present- Back Pain and Joint Stiffness. Not Present- Joint Pain, Muscle Pain, Muscle Weakness and Swelling of Extremities. Psychiatric Present- Anxiety and Depression. Not Present- Bipolar, Change in Sleep Pattern, Fearful and Frequent crying. Endocrine Not Present- Cold Intolerance, Excessive Hunger, Hair Changes, Heat Intolerance, Hot flashes and New Diabetes. Hematology Not Present- Blood Thinners, Easy Bruising, Excessive bleeding, Gland problems, HIV and Persistent Infections.  Vitals Weight: 218 lb Height: 63in Body Surface Area: 2.01 m Body Mass Index: 38.62 kg/m  Temp.: 97.66F(Temporal)  Pulse: 76 (Regular)  BP: 130/68 (Sitting, Left Arm, Standard)  Physical Exam  General Mental  Status-Alert. Orientation-Oriented X3. Eye Sclera/Conjunctiva - Bilateral-No scleral icterus. Chest and Lung Exam Chest and lung exam reveals -quiet, even and easy respiratory effort with no use of accessory muscles and on ausculation, normal breath sounds, no adventitious sounds and normal vocal resonance. Breast Nipples-No Discharge. Breast Lump-No Palpable Breast Mass. Note: well healed reduction scars Cardiovascular Cardiovascular examination reveals -normal  heart sounds, regular rate and rhythm with no murmurs. Lymphatic Head & Neck General Head & Neck Lymphatics: Bilateral - Description - Normal. Axillary General Axillary Region: Bilateral - Description - Normal. Note: no Edmond adenopathy  Assessment & Plan  MASS OF LEFT BREAST ON MAMMOGRAM (N63) Story: left breast seed guided excisional biopsy we discussed options including six month follow up. we discussed risks/benefits of each and she desires excision. we discussed seed guided excision with risks/recovery

## 2016-08-23 NOTE — Anesthesia Procedure Notes (Signed)
Procedure Name: LMA Insertion Date/Time: 08/23/2016 8:59 AM Performed by: Wanita Chamberlain Pre-anesthesia Checklist: Patient identified, Timeout performed, Emergency Drugs available, Suction available and Patient being monitored Patient Re-evaluated:Patient Re-evaluated prior to inductionOxygen Delivery Method: Circle system utilized Preoxygenation: Pre-oxygenation with 100% oxygen Intubation Type: IV induction Ventilation: Mask ventilation without difficulty LMA: LMA inserted LMA Size: 4.0 Number of attempts: 1 Placement Confirmation: positive ETCO2 and breath sounds checked- equal and bilateral Tube secured with: Tape Dental Injury: Teeth and Oropharynx as per pre-operative assessment

## 2016-08-23 NOTE — Transfer of Care (Signed)
Immediate Anesthesia Transfer of Care Note  Patient: [name]  Procedure(s) Performed: Procedure(s): LEFT BREAST LUMPECTOMY WITH RADIOACTIVE SEED LOCALIZATION (Left)  Patient Location: PACU  Anesthesia Type:General  Level of Consciousness: awake, alert , oriented and patient cooperative  Airway & Oxygen Therapy: Patient Spontanous Breathing and Patient connected to face mask oxygen  Post-op Assessment: Report given to RN and Post -op Vital signs reviewed and stable  Post vital signs: Reviewed and stable  Last Vitals:  Vitals:   08/23/16 0757  BP: 116/63  Pulse: (!) 58  Resp: 18  Temp: 36.6 C    Last Pain:  Vitals:   08/23/16 0757  TempSrc: Oral         Complications: No apparent anesthesia complications

## 2016-08-23 NOTE — Discharge Instructions (Signed)
[facility] °Office Phone Number [phone] ° °POST OP INSTRUCTIONS ° °Always review your discharge instruction sheet given to you by the facility where your surgery was performed. ° °IF YOU HAVE DISABILITY OR FAMILY LEAVE FORMS, YOU MUST BRING THEM TO THE OFFICE FOR PROCESSING.  DO NOT GIVE THEM TO YOUR DOCTOR. ° °1. A prescription for pain medication may be given to you upon discharge.  Take your pain medication as prescribed, if needed.  If narcotic pain medicine is not needed, then you may take acetaminophen (Tylenol), naprosyn (Alleve) or ibuprofen (Advil) as needed. °2. Take your usually prescribed medications unless otherwise directed °3. If you need a refill on your pain medication, please contact your pharmacy.  They will contact our office to request authorization.  Prescriptions will not be filled after 5pm or on week-ends. °4. You should eat very light the first 24 hours after surgery, such as soup, crackers, pudding, etc.  Resume your normal diet the day after surgery. °5. Most patients will experience some swelling and bruising in the breast.  Ice packs and a good support bra will help.  Wear the breast binder provided or a sports bra for 72 hours day and night.  After that wear a sports bra during the day until you return to the office. Swelling and bruising can take several days to resolve.  °6. It is common to experience some constipation if taking pain medication after surgery.  Increasing fluid intake and taking a stool softener will usually help or prevent this problem from occurring.  A mild laxative (Milk of Magnesia or Miralax) should be taken according to package directions if there are no bowel movements after 48 hours. °7. Unless discharge instructions indicate otherwise, you may remove your bandages 48 hours after surgery and you may shower at that time.  You may have steri-strips (small skin tapes) in place directly over the incision.  These strips should be left on the  skin for 7-10 days and will come off on their own.  If your surgeon used skin glue on the incision, you may shower in 24 hours.  The glue will flake off over the next 2-3 weeks.  Any sutures or staples will be removed at the office during your follow-up visit. °8. ACTIVITIES:  You may resume regular daily activities (gradually increasing) beginning the next day.  Wearing a good support bra or sports bra minimizes pain and swelling.  You may have sexual intercourse when it is comfortable. °a. You may drive when you no longer are taking prescription pain medication, you can comfortably wear a seatbelt, and you can safely maneuver your car and apply brakes. °b. RETURN TO WORK:  ______________________________________________________________________________________ °9. You should see your doctor in the office for a follow-up appointment approximately two weeks after your surgery.  Your doctor’s nurse will typically make your follow-up appointment when she calls you with your pathology report.  Expect your pathology report 3-4 business days after your surgery.  You may call to check if you do not hear from us after three days. °10. OTHER INSTRUCTIONS: _______________________________________________________________________________________________ _____________________________________________________________________________________________________________________________________ °_____________________________________________________________________________________________________________________________________ °_____________________________________________________________________________________________________________________________________ ° °WHEN TO CALL DR WAKEFIELD: °1. Fever over 101.0 °2. Nausea and/or vomiting. °3. Extreme swelling or bruising. °4. Continued bleeding from incision. °5. Increased pain, redness, or drainage from the incision. ° °The clinic staff is available to answer your questions during regular  business hours.  Please don’t hesitate to call and ask to speak to one of the nurses for clinical concerns.  If   you have a medical emergency, go to the nearest emergency room or call 911.  A surgeon from [facility] is always on call at the hospital. ° °For further questions, please visit [email or web address] ° ° ° °Post Anesthesia Home Care Instructions ° °Activity: °Get plenty of rest for the remainder of the day. A responsible adult should stay with you for 24 hours following the procedure.  °For the next 24 hours, DO NOT: °-Drive a car °-Operate machinery °-Drink alcoholic beverages °-Take any medication unless instructed by your physician °-Make any legal decisions or sign important papers. ° °Meals: °Start with liquid foods such as gelatin or soup. Progress to regular foods as tolerated. Avoid greasy, spicy, heavy foods. If nausea and/or vomiting occur, drink only clear liquids until the nausea and/or vomiting subsides. Call your physician if vomiting continues. ° °Special Instructions/Symptoms: °Your throat may feel dry or sore from the anesthesia or the breathing tube placed in your throat during surgery. If this causes discomfort, gargle with warm salt water. The discomfort should disappear within 24 hours. ° °If you had a scopolamine patch placed behind your ear for the management of post- operative nausea and/or vomiting: ° °1. The medication in the patch is effective for 72 hours, after which it should be removed.  Wrap patch in a tissue and discard in the trash. Wash hands thoroughly with soap and water. °2. You may remove the patch earlier than 72 hours if you experience unpleasant side effects which may include dry mouth, dizziness or visual disturbances. °3. Avoid touching the patch. Wash your hands with soap and water after contact with the patch. °  ° °

## 2016-08-23 NOTE — Interval H&P Note (Signed)
History and Physical Interval Note:  08/23/2016 8:17 AM  [name]  has presented today for surgery, with the diagnosis of LEFT BREAST MASS  The various methods of treatment have been discussed with the patient and family. After consideration of risks, benefits and other options for treatment, the patient has consented to  Procedure(s): LEFT BREAST LUMPECTOMY WITH RADIOACTIVE SEED LOCALIZATION (Left) as a surgical intervention .  The patient's history has been reviewed, patient examined, no change in status, stable for surgery.  I have reviewed the patient's chart and labs.  Questions were answered to the patient's satisfaction.     Kaipo Ardis

## 2016-08-23 NOTE — Op Note (Signed)
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 Serita Grammes Anesthesia: Gen. Estimated blood loss: Minimal Complications: None Drains: None Specimens:Leftbreast tissue marked with paint Sponge and needle count correct at completion Disposition to recovery stable  Indications: This is a 89yof who has a leftsided mammographic abnormality. She has prior history of breast reduction. We discussed seed guided excision.She had seed placed prior to surgery. I had her mm in the OR.   Procedure: After informed consent was obtained she was then taken to the operating room. She was given cefazolin. Sequential compression devices were on her legs. She was placed under general anesthesia without complication. Her leftbreast was then prepped and draped in the standard sterile surgical fashion. A surgical timeout was then performed.  I located the radioactive seed with the neoprobe.I infiltrated marcaine in the area of the seed. this was in retroareolar position close to areola. I made a  periareolar incision.This was in location from prior incision from reduction and there was a fair amount of scarring. I then used the neoprobe to guide the excision of the seed and surrounding tissue. This was confirmed by the neoprobe. This was painted. This was then taken for mammogram which confirmed removal of the seed and the clip. These were both at edge of specimen but this was the areola.  This was confirmed by radiology. This was then sent to pathology. Hemostasis was observed.I closed the breast tissue with a 2-0 Vicryl. The dermis was closed with 3-0 Vicryl and the skin with 5-0 Monocryl.Dermabond and steristrips were placed on the incision. She was transferred to recovery stable

## 2016-08-23 NOTE — Anesthesia Postprocedure Evaluation (Signed)
Anesthesia Post Note  Patient: [name]  Procedure(s) Performed: Procedure(s) (LRB): LEFT BREAST LUMPECTOMY WITH RADIOACTIVE SEED LOCALIZATION (Left)  Patient location during evaluation: PACU Anesthesia Type: General Level of consciousness: awake and alert Pain management: pain level controlled Vital Signs Assessment: post-procedure vital signs reviewed and stable Respiratory status: spontaneous breathing, nonlabored ventilation, respiratory function stable and patient connected to nasal cannula oxygen Cardiovascular status: blood pressure returned to baseline and stable Postop Assessment: no signs of nausea or vomiting Anesthetic complications: no    Last Vitals:  Vitals:   08/23/16 1028 08/23/16 1055  BP: (!) 149/79 (!) 141/74  Pulse: (!) 51 (!) 57  Resp: 18 18  Temp: 36.6 C 36.6 C    Last Pain:  Vitals:   08/23/16 1055  TempSrc: Oral  PainSc:                  Hutch Rhett JENNETTE

## 2016-08-23 NOTE — Anesthesia Preprocedure Evaluation (Signed)
Anesthesia Evaluation  Patient identified by MRN, date of birth, ID band Patient awake    Reviewed: Allergy & Precautions, NPO status , Patient's Chart, lab work & pertinent test results  History of Anesthesia Complications Negative for: history of anesthetic complications  Airway Mallampati: III  TM Distance: >3 FB Neck ROM: Full    Dental no notable dental hx. (+) Dental Advisory Given   Pulmonary neg pulmonary ROS,    Pulmonary exam normal breath sounds clear to auscultation       Cardiovascular hypertension, Pt. on medications Normal cardiovascular exam Rhythm:Regular Rate:Normal     Neuro/Psych  Headaches, PSYCHIATRIC DISORDERS Anxiety Depression    GI/Hepatic Neg liver ROS, GERD  Controlled,  Endo/Other  obesity  Renal/GU negative Renal ROS  negative genitourinary   Musculoskeletal negative musculoskeletal ROS (+)   Abdominal (+) + obese,   Peds negative pediatric ROS (+)  Hematology negative hematology ROS (+)   Anesthesia Other Findings   Reproductive/Obstetrics negative OB ROS                             Anesthesia Physical Anesthesia Plan  ASA: II  Anesthesia Plan: General   Post-op Pain Management:    Induction: Intravenous  Airway Management Planned: LMA  Additional Equipment:   Intra-op Plan:   Post-operative Plan: Extubation in OR  Informed Consent: I have reviewed the patients History and Physical, chart, labs and discussed the procedure including the risks, benefits and alternatives for the proposed anesthesia with the patient or authorized representative who has indicated his/her understanding and acceptance.   Dental advisory given  Plan Discussed with: CRNA  Anesthesia Plan Comments:         Anesthesia Quick Evaluation

## 2016-08-24 ENCOUNTER — Encounter (HOSPITAL_BASED_OUTPATIENT_CLINIC_OR_DEPARTMENT_OTHER): Payer: Self-pay | Admitting: General Surgery

## 2016-08-27 ENCOUNTER — Other Ambulatory Visit: Payer: Self-pay | Admitting: Family Medicine

## 2016-08-29 ENCOUNTER — Telehealth: Payer: Self-pay | Admitting: Emergency Medicine

## 2016-08-29 MED ORDER — VALACYCLOVIR HCL 500 MG PO TABS: 500.0000 mg | ORAL_TABLET | Freq: Two times a day (BID) | ORAL | 2 refills | Status: DC

## 2016-08-29 NOTE — Telephone Encounter (Signed)
Received refill request for VALACYCLOVIR 500MG  TABLETS. This med is not currently on pt's med list but is in the hx. Will it be ok to refill? Please advise.

## 2016-10-02 ENCOUNTER — Other Ambulatory Visit: Payer: Self-pay | Admitting: Internal Medicine

## 2016-10-03 NOTE — Telephone Encounter (Signed)
Rx request sent to pharmacy.  

## 2016-12-26 ENCOUNTER — Ambulatory Visit: Payer: Self-pay | Admitting: Family

## 2016-12-26 ENCOUNTER — Encounter: Payer: Self-pay | Admitting: Family Medicine

## 2017-03-19 NOTE — Progress Notes (Signed)
[place] at [facility] [address], [place], [place] [address] [phone]  Date:  03/20/2017   Name:  [name]   DOB:  1951/11/03   MRN:  [id number]  PCP:  Lamar Blinks, MD    Chief Complaint: Hip Pain (Left x 3 weeks)   History of Present Illness:  [name] is a 66 y.o. very pleasant female patient who presents with the following:  History of HTN, dyslipidemia, obesity, pre-diabetes.  I last saw her about a year ago and she plans to return soon to discuss her other health issues.  Here today for an acute issue-   Here today with concern of left hip pain for about 3 weeks.  She has had some trouble with intermittent hip pain off an on for years.  However, over the last month or so it has been worse.   She is favoring the hip and it is causing her to limp some We did films about 3 years ago which showed mild degenerative change She did a lot of stair climbing (cleaning out an attic) prior to onset of this pain- this seemed to have flared up her hip.  Otherwise she is not aware of any injury to her leg  She has had steroid injections into her spine in the past with success  She has tried some OTC medications, and some tylenol #3 that she had on hand, flector patches. Nothing has really helped her.    She is due for labs today   BP Readings from Last 3 Encounters:  03/20/17 140/80  08/23/16 (!) 141/74  08/02/16 139/82   Wt Readings from Last 3 Encounters:  03/20/17 221 lb 3.2 oz (100.3 kg)  08/23/16 219 lb (99.3 kg)  08/02/16 219 lb 3.2 oz (99.4 kg)   Her weight is stable Lab Results  Component Value Date   HGBA1C 5.9 02/04/2016     Patient Active Problem List   Diagnosis Date Noted  . Pre-diabetes 02/05/2016  . Left thyroid nodule 11/10/2014  . TBI (traumatic brain injury) ([place]) 07/15/2014  . Chest pain 12/13/2013  . Dyslipidemia 12/13/2013  . Obesity (BMI 35.0-39.9 without  comorbidity) 12/13/2013  . Hypertension 01/18/2012  . GERD (gastroesophageal reflux disease) 01/18/2012  . Depression 01/18/2012  . Migraine syndrome 01/18/2012    Past Medical History:  Diagnosis Date  . Allergy   . Arthritis   . Depression   . Dyslipidemia   . GERD (gastroesophageal reflux disease)   . Headaches due to old head injury   . Hypertension   . Obesity   . Reflux   . Thyroid disease     Past Surgical History:  Procedure Laterality Date  . ABDOMINAL HYSTERECTOMY    . BREAST LUMPECTOMY WITH RADIOACTIVE SEED LOCALIZATION Left 08/23/2016   Procedure: LEFT BREAST LUMPECTOMY WITH RADIOACTIVE SEED LOCALIZATION;  Surgeon: Rolm Bookbinder, MD;  Location: [facility];  Service: General;  Laterality: Left;  . BREAST SURGERY  2003   reduction  . CESAREAN SECTION    . FACET JOINT INJECTION  2011 and 2012  . MYOMECTOMY    . NM MYOCAR PERF WALL MOTION  03/2009   dipyridamole; normal pattern of perfusion in all regions, post-stress EF 65%, normal study   . THYROIDECTOMY  1989  . TRANSTHORACIC ECHOCARDIOGRAM  03/2009   EF=>55%, borderline conc LVH; mild mitral annular calcif, trace MR; trace TR; AV mildly sclerotic, mild AV regurg; mild pulm  valve regurg   . TUBAL LIGATION      Social History  Substance Use Topics  . Smoking status: Never Smoker  . Smokeless tobacco: Never Used  . Alcohol use 0.5 - 1.0 oz/week    1 - 2 Standard drinks or equivalent per week    Family History  Problem Relation Age of Onset  . Heart disease Mother     MI, HTN  . Hypertension Mother   . Thyroid disease Mother   . Heart disease Maternal Grandmother     MI, CVA  . Heart disease Maternal Grandfather     MI  . Stroke Maternal Grandfather   . Heart disease Paternal Grandmother   . Stroke Paternal Grandmother   . Heart disease Paternal Grandfather     MI  . Glaucoma Father   . Diabetes Father   . Depression Daughter   . ADD / ADHD Son   . Thyroid disease Sister   .  Heart attack Paternal Uncle     HTN  . Hypertension Paternal Uncle     Allergies  Allergen Reactions  . Metoprolol Hives  . Onabotulinumtoxina (Cosmetic) Swelling  . Botox [Botulinum Toxin Type A]   . Hydrochlorothiazide Other (See Comments)    Increases risk of gout flares  . Toprol Xl [Metoprolol Succinate] Hives    Medication list has been reviewed and updated.  Current Outpatient Prescriptions on File Prior to Visit  Medication Sig Dispense Refill  . ALPRAZolam (XANAX) 0.25 MG tablet Take 0.25 mg by mouth 2 (two) times daily as needed for sleep.    Marland Kitchen amLODipine (NORVASC) 10 MG tablet TAKE 1 TABLET BY MOUTH DAILY 30 tablet 11  . aspirin 81 MG tablet Take 81 mg by mouth daily.    . butalbital-acetaminophen-caffeine (FIORICET WITH CODEINE) 50-325-40-30 MG per capsule Take 1 capsule by mouth every 4 (four) hours as needed.    . carvedilol (COREG) 3.125 MG tablet Take 1 tablet (3.125 mg total) by mouth 2 (two) times daily. 60 tablet 2  . desvenlafaxine (PRISTIQ) 100 MG 24 hr tablet Take 100 mg by mouth daily.    . Diclofenac Potassium (CAMBIA PO) Take 50 mg by mouth as needed.     . diclofenac sodium (VOLTAREN) 1 % GEL Apply 2 g topically 4 (four) times daily. 100 g 3  . Eszopiclone (ESZOPICLONE) 3 MG TABS Take 3 mg by mouth at bedtime. Take immediately before bedtime    . frovatriptan (FROVA) 2.5 MG tablet Take 2.5 mg by mouth as needed for migraine. If recurs, may repeat after 2 hours. Max of 3 tabs in 24 hours.    . gabapentin (NEURONTIN) 300 MG capsule Take 300 mg by mouth 3 (three) times daily.    Marland Kitchen ipratropium (ATROVENT) 0.03 % nasal spray Place 2 sprays into the nose 4 (four) times daily. 30 mL 1  . nitroGLYCERIN (NITROSTAT) 0.4 MG SL tablet Place 1 tablet (0.4 mg total) under the tongue every 5 (five) minutes as needed. 30 tablet 0  . Olopatadine HCl (PATADAY) 0.2 % SOLN Apply 1 drop to eye daily as needed (for itching). 2.5 mL 3  . omeprazole (PRILOSEC) 40 MG capsule Take 40  mg by mouth daily.    . valACYclovir (VALTREX) 500 MG tablet Take 1 tablet (500 mg total) by mouth 2 (two) times daily. Use for 3 days as needed for outbreak 30 tablet 2   No current facility-administered medications on file prior to visit.     Review  of Systems:  As per HPI- otherwise negative.  No fever, chills, CP, SOB, rash, nausea, vomiting   Physical Examination: Vitals:   03/20/17 1432  BP: 140/80  Pulse: 77  Temp: 97.9 F (36.6 C)   Vitals:   03/20/17 1432  Weight: 221 lb 3.2 oz (100.3 kg)  Height: 5\' 3"  (1.6 m)   Body mass index is 39.18 kg/m. Ideal Body Weight: Weight in (lb) to have BMI = 25: 140.8  GEN: WDWN, NAD, Non-toxic, A & O x 3, obese, otherwise looks well HEENT: Atraumatic, Normocephalic. Neck supple. No masses, No LAD. Ears and Nose: No external deformity. CV: RRR, No M/G/R. No JVD. No thrill. No extra heart sounds. PULM: CTA B, no wheezes, crackles, rhonchi. No retractions. No resp. distress. No accessory muscle use. ABD: S, NT, ND EXTR: No c/c/e NEURO Normal gait.  PSYCH: Normally interactive. Conversant. Not depressed or anxious appearing.  Calm demeanor.  Left hip:   She is very tender to pressure over the lateral hip No pain with rotation, flexion of the hip.  She is not tender over the groin.  No redness or swelling.  Tenderness is most c/w GT bursitis   Dg Hip Unilat W Or W/o Pelvis 2-3 Views Left  Result Date: 03/20/2017 CLINICAL DATA:  Lateral LEFT hip pain that comes and goes and radiates down LEFT leg for 1 month, no known recent injury/ trauma EXAM: DG HIP (WITH OR WITHOUT PELVIS) 2-3V LEFT COMPARISON:  None FINDINGS: Few pelvic phleboliths. Mild osseous demineralization. Joint spaces preserved. No acute fracture, dislocation, or bone destruction. IMPRESSION: No acute osseous abnormalities. Electronically Signed   By: Lavonia Dana M.D.   On: 03/20/2017 15:23   VC obtained.  Prepped area over LEFT GT with betadine and alcohol. Injected left  greater trochanteric bursa with 37ml of 1% lido and 40 mg depo-medrol.  Pt tolerated well and noted relief of pain  Assessment and Plan: Pain in left hip - Plan: DG HIP UNILAT W OR W/O PELVIS 2-3 VIEWS LEFT, methylPREDNISolone acetate (DEPO-MEDROL) injection 40 mg, acetaminophen-codeine (TYLENOL #3) 300-30 MG tablet  Greater trochanteric bursitis of left hip  Treated as above.  She will let me know if relief does not last.  She plans to see me within the next month or so to catch up on her other health issues   Signed Lamar Blinks, MD

## 2017-03-20 ENCOUNTER — Ambulatory Visit (INDEPENDENT_AMBULATORY_CARE_PROVIDER_SITE_OTHER): Payer: BC Managed Care – PPO | Admitting: Family Medicine

## 2017-03-20 ENCOUNTER — Ambulatory Visit (HOSPITAL_BASED_OUTPATIENT_CLINIC_OR_DEPARTMENT_OTHER)
Admission: RE | Admit: 2017-03-20 | Discharge: 2017-03-20 | Disposition: A | Discharge status: Home / Self Care | Payer: BC Managed Care – PPO | Source: Ambulatory Visit | Attending: Family Medicine | Admitting: Family Medicine

## 2017-03-20 VITALS — BP 140/80 | HR 77 | Temp 97.9°F | Ht 63.0 in | Wt 221.2 lb

## 2017-03-20 DIAGNOSIS — M25552 Pain in left hip: Secondary | ICD-10-CM

## 2017-03-20 DIAGNOSIS — M7062 Trochanteric bursitis, left hip: Secondary | ICD-10-CM | POA: Diagnosis not present

## 2017-03-20 MED ORDER — ACETAMINOPHEN-CODEINE #3 300-30 MG PO TABS
1.0000 | ORAL_TABLET | Freq: Three times a day (TID) | ORAL | 0 refills | Status: DC | PRN
Start: 2017-03-20 — End: 2017-04-03

## 2017-03-20 MED ORDER — METHYLPREDNISOLONE ACETATE 40 MG/ML IJ SUSP
40.0000 mg | Freq: Once | INTRAMUSCULAR | Status: AC
  Administered 2017-03-20: 40 mg via INTRA_ARTICULAR

## 2017-03-20 NOTE — Patient Instructions (Addendum)
I hope that the shot you got today is helpful for your hip pain.  Please let me know how it works for you.    Continue to use the tylenol 3 sparingly as needed I will see you soon to discuss your other concerns!

## 2017-03-28 ENCOUNTER — Encounter: Payer: Self-pay | Admitting: Family Medicine

## 2017-04-02 NOTE — Progress Notes (Addendum)
[place] at [facility] [address], [place], [place] [address] [phone]  Date:  04/03/2017   Name:  [name]   DOB:  1951/01/07   MRN:  [id number]  PCP:  Darreld Mclean, MD    Chief Complaint: Follow-up (Pt here for 2 week f/u on hip pain. Pt states that the pain is slightly better but still a concern. ); Obesity (c/o gaining almost 20lbs in the past 5-6 yrs. ); and bone density (Pt would like to know if it's time for her to have a bone density test. )   History of Present Illness:  [name] is a 66 y.o. very pleasant female patient who presents with the following:  Here today for a follow-up visit- about 2 weeks ago we injected her left hip for trochanteric bursitis We did films of her hip at that visit as below- normal.  We gave her tylenol #3 and she is also taking aleve for her hip She has had the hip trouble for about 6 weeks overall. She thinks this started when she was cleaning out an attic.   Her hip did feel better for a few days but is now more painful again The hip is better but still bothering her so she cannot exercise like she would like to  She has gained some weight over the last few years .  She retired in 2009- she was more active when she was working, but overall she enjoys being retired.  However she notes that as she has less structure to her days she tends to eat more  Wt Readings from Last 3 Encounters:  04/03/17 222 lb 3.2 oz (100.8 kg)  03/20/17 221 lb 3.2 oz (100.3 kg)  08/23/16 219 lb (99.3 kg)   2013 weight 213 She has been over 200 lbs for about 5 years When she was 31 she weighed approx 165 lbs "I am a stress eater"   Right now she is avoiding fried foods, 1 soda a day (vs her old 5 or 6)  She had never had a bone density exam- would like to schedule this   She did have a benign thyroid nodule in 2015- she had a partial thyroidectomy in 1992.  She has noted  that her thyroid seems more full recently and was a bit concerned about it.    Lab Results  Component Value Date   TSH 0.70 12/29/2014     BP Readings from Last 3 Encounters:  04/03/17 (!) 154/82  03/20/17 140/80  08/23/16 (!) 141/74   Dg Hip Unilat W Or W/o Pelvis 2-3 Views Left  Result Date: 03/20/2017 CLINICAL DATA:  Lateral LEFT hip pain that comes and goes and radiates down LEFT leg for 1 month, no known recent injury/ trauma EXAM: DG HIP (WITH OR WITHOUT PELVIS) 2-3V LEFT COMPARISON:  None FINDINGS: Few pelvic phleboliths. Mild osseous demineralization. Joint spaces preserved. No acute fracture, dislocation, or bone destruction. IMPRESSION: No acute osseous abnormalities. Electronically Signed   By: Lavonia Dana M.D.   On: 03/20/2017 15:23   Patient Active Problem List   Diagnosis Date Noted  . Pre-diabetes 02/05/2016  . Left thyroid nodule 11/10/2014  . TBI (traumatic brain injury) ([place]) 07/15/2014  . Chest pain 12/13/2013  . Dyslipidemia 12/13/2013  . Obesity (BMI 35.0-39.9 without comorbidity) 12/13/2013  . Hypertension 01/18/2012  . GERD (gastroesophageal reflux disease) 01/18/2012  . Depression 01/18/2012  . Migraine syndrome 01/18/2012  Past Medical History:  Diagnosis Date  . Allergy   . Arthritis   . Depression   . Dyslipidemia   . GERD (gastroesophageal reflux disease)   . Headaches due to old head injury   . Hypertension   . Obesity   . Reflux   . Thyroid disease     Past Surgical History:  Procedure Laterality Date  . ABDOMINAL HYSTERECTOMY    . BREAST LUMPECTOMY WITH RADIOACTIVE SEED LOCALIZATION Left 08/23/2016   Procedure: LEFT BREAST LUMPECTOMY WITH RADIOACTIVE SEED LOCALIZATION;  Surgeon: Rolm Bookbinder, MD;  Location: [facility];  Service: General;  Laterality: Left;  . BREAST SURGERY  2003   reduction  . CESAREAN SECTION    . FACET JOINT INJECTION  2011 and 2012  . MYOMECTOMY    . NM MYOCAR PERF WALL MOTION  03/2009    dipyridamole; normal pattern of perfusion in all regions, post-stress EF 65%, normal study   . THYROIDECTOMY  1989  . TRANSTHORACIC ECHOCARDIOGRAM  03/2009   EF=>55%, borderline conc LVH; mild mitral annular calcif, trace MR; trace TR; AV mildly sclerotic, mild AV regurg; mild pulm valve regurg   . TUBAL LIGATION      Social History  Substance Use Topics  . Smoking status: Never Smoker  . Smokeless tobacco: Never Used  . Alcohol use 0.5 - 1.0 oz/week    1 - 2 Standard drinks or equivalent per week    Family History  Problem Relation Age of Onset  . Heart disease Mother     MI, HTN  . Hypertension Mother   . Thyroid disease Mother   . Heart disease Maternal Grandmother     MI, CVA  . Heart disease Maternal Grandfather     MI  . Stroke Maternal Grandfather   . Heart disease Paternal Grandmother   . Stroke Paternal Grandmother   . Heart disease Paternal Grandfather     MI  . Glaucoma Father   . Diabetes Father   . Depression Daughter   . ADD / ADHD Son   . Thyroid disease Sister   . Heart attack Paternal Uncle     HTN  . Hypertension Paternal Uncle     Allergies  Allergen Reactions  . Metoprolol Hives  . Onabotulinumtoxina (Cosmetic) Swelling  . Botox [Botulinum Toxin Type A]   . Hydrochlorothiazide Other (See Comments)    Increases risk of gout flares  . Toprol Xl [Metoprolol Succinate] Hives    Medication list has been reviewed and updated.  Current Outpatient Prescriptions on File Prior to Visit  Medication Sig Dispense Refill  . acetaminophen-codeine (TYLENOL #3) 300-30 MG tablet Take 1-2 tablets by mouth every 8 (eight) hours as needed for moderate pain. 30 tablet 0  . ALPRAZolam (XANAX) 0.25 MG tablet Take 0.25 mg by mouth 2 (two) times daily as needed for sleep.    Marland Kitchen amLODipine (NORVASC) 10 MG tablet TAKE 1 TABLET BY MOUTH DAILY 30 tablet 11  . aspirin 81 MG tablet Take 81 mg by mouth daily.    . butalbital-acetaminophen-caffeine (FIORICET WITH CODEINE)  50-325-40-30 MG per capsule Take 1 capsule by mouth every 4 (four) hours as needed.    . carvedilol (COREG) 3.125 MG tablet Take 1 tablet (3.125 mg total) by mouth 2 (two) times daily. 60 tablet 2  . desvenlafaxine (PRISTIQ) 100 MG 24 hr tablet Take 100 mg by mouth daily.    . diclofenac (FLECTOR) 1.3 % PTCH Place 1 patch onto the skin 2 (  two) times daily.    . Diclofenac Potassium (CAMBIA PO) Take 50 mg by mouth as needed.     . diclofenac sodium (VOLTAREN) 1 % GEL Apply 2 g topically 4 (four) times daily. 100 g 3  . Eszopiclone (ESZOPICLONE) 3 MG TABS Take 3 mg by mouth at bedtime. Take immediately before bedtime    . frovatriptan (FROVA) 2.5 MG tablet Take 2.5 mg by mouth as needed for migraine. If recurs, may repeat after 2 hours. Max of 3 tabs in 24 hours.    . gabapentin (NEURONTIN) 300 MG capsule Take 300 mg by mouth 3 (three) times daily.    Marland Kitchen ipratropium (ATROVENT) 0.03 % nasal spray Place 2 sprays into the nose 4 (four) times daily. 30 mL 1  . nitroGLYCERIN (NITROSTAT) 0.4 MG SL tablet Place 1 tablet (0.4 mg total) under the tongue every 5 (five) minutes as needed. 30 tablet 0  . Olopatadine HCl (PATADAY) 0.2 % SOLN Apply 1 drop to eye daily as needed (for itching). 2.5 mL 3  . omeprazole (PRILOSEC) 40 MG capsule Take 40 mg by mouth daily.    . valACYclovir (VALTREX) 500 MG tablet Take 1 tablet (500 mg total) by mouth 2 (two) times daily. Use for 3 days as needed for outbreak 30 tablet 2   No current facility-administered medications on file prior to visit.     Review of Systems:  As per HPI- otherwise negative.   Physical Examination: Vitals:   04/03/17 1020 04/03/17 1027  BP: (!) 152/80 (!) 154/82  Pulse: 63   Temp: 97.9 F (36.6 C)    Vitals:   04/03/17 1020  Weight: 222 lb 3.2 oz (100.8 kg)  Height: 5\' 3"  (1.6 m)   Body mass index is 39.36 kg/m. Ideal Body Weight: Weight in (lb) to have BMI = 25: 140.8  GEN: WDWN, NAD, Non-toxic, A & O x 3, obese, otherwise looks  well HEENT: Atraumatic, Normocephalic. Neck supple. No masses, No LAD.  Bilateral TM wnl, oropharynx normal.  PEERL,EOMI.   Ears and Nose: No external deformity. CV: RRR, No M/G/R. No JVD. No thrill. No extra heart sounds. PULM: CTA B, no wheezes, crackles, rhonchi. No retractions. No resp. distress. No accessory muscle use. ABD: S, NT, ND. No rebound. No HSM. EXTR: No c/c/e NEURO Normal gait.  PSYCH: Normally interactive. Conversant. Not depressed or anxious appearing.  Calm demeanor.  Left hip: she is tender over the greater trochanter and also over the IT band.  The joint itself seems to be benign with full ROM Thyroid exam: thryoid feels more full, esp on the right side   Assessment and Plan: Estrogen deficiency - Plan: DG Bone Density  Thyroid enlargement - Plan: US THYROID, TSH, T3, free  Left hip pain - Plan: Ambulatory referral to Sports Medicine  Pre-diabetes - Plan: Comprehensive metabolic panel, Hemoglobin A1c  Essential hypertension  Screening for deficiency anemia - Plan: CBC  Pain in left hip - Plan: acetaminophen-codeine (TYLENOL #3) 300-30 MG tablet  Recheck visit today Orders as below She is interested in the [facility]- gave her flyer and she plans to contact them Concern that her NSAID use is increasing her BP- did refill her tylenol #3 to use for her hip pain for now It was nice to see you today, as always!   Please contact the weight management clinic if you like; I think they may be able to help you with weight loss We will refer you for  1. Thyroid ultrasound 2.  Bone density scan 3. Sports medicine to look at your left hip  I will be in touch with your labs asap   Signed Lamar Blinks, MD  Received her labs 5/18  Results for orders placed or performed in visit on 04/03/17  Comprehensive metabolic panel  Result Value Ref Range   Sodium 142 135 - 145 mEq/L   Potassium 3.4 (L) 3.5 - 5.1 mEq/L   Chloride 107 96 - 112  mEq/L   CO2 31 19 - 32 mEq/L   Glucose, Bld 107 (H) 70 - 99 mg/dL   BUN 11 6 - 23 mg/dL   Creatinine, Ser 0.75 0.40 - 1.20 mg/dL   Total Bilirubin 0.4 0.2 - 1.2 mg/dL   Alkaline Phosphatase 72 39 - 117 U/L   AST 15 0 - 37 U/L   ALT 15 0 - 35 U/L   Total Protein 7.1 6.0 - 8.3 g/dL   Albumin 3.8 3.5 - 5.2 g/dL   Calcium 8.8 8.4 - 10.5 mg/dL   GFR 99.90 >60.00 mL/min  CBC  Result Value Ref Range   WBC 3.8 (L) 4.0 - 10.5 K/uL   RBC 4.72 3.87 - 5.11 Mil/uL   Platelets 265.0 150.0 - 400.0 K/uL   Hemoglobin 14.0 12.0 - 15.0 g/dL   HCT 41.7 36.0 - 46.0 %   MCV 88.2 78.0 - 100.0 fl   MCHC 33.5 30.0 - 36.0 g/dL   RDW 14.3 11.5 - 15.5 %  TSH  Result Value Ref Range   TSH 0.48 0.35 - 4.50 uIU/mL  Hemoglobin A1c  Result Value Ref Range   Hgb A1c MFr Bld 5.8 4.6 - 6.5 %  T3, free  Result Value Ref Range   T3, Free 3.5 2.3 - 4.2 pg/mL

## 2017-04-03 ENCOUNTER — Ambulatory Visit (INDEPENDENT_AMBULATORY_CARE_PROVIDER_SITE_OTHER): Payer: BC Managed Care – PPO | Admitting: Family Medicine

## 2017-04-03 VITALS — BP 120/85 | HR 63 | Temp 97.9°F | Ht 63.0 in | Wt 222.2 lb

## 2017-04-03 DIAGNOSIS — E049 Nontoxic goiter, unspecified: Secondary | ICD-10-CM | POA: Diagnosis not present

## 2017-04-03 DIAGNOSIS — Z130 Encounter for screening for diseases of the blood and blood-forming organs and certain disorders involving the immune mechanism: Secondary | ICD-10-CM

## 2017-04-03 DIAGNOSIS — M25552 Pain in left hip: Secondary | ICD-10-CM

## 2017-04-03 DIAGNOSIS — E2839 Other primary ovarian failure: Secondary | ICD-10-CM | POA: Diagnosis not present

## 2017-04-03 DIAGNOSIS — R7303 Prediabetes: Secondary | ICD-10-CM | POA: Diagnosis not present

## 2017-04-03 DIAGNOSIS — I10 Essential (primary) hypertension: Secondary | ICD-10-CM

## 2017-04-03 MED ORDER — ACETAMINOPHEN-CODEINE #3 300-30 MG PO TABS: 1.0000 | ORAL_TABLET | Freq: Three times a day (TID) | ORAL | 0 refills | Status: DC | PRN

## 2017-04-03 NOTE — Patient Instructions (Signed)
It was nice to see you today, as always!   Please contact the weight management clinic if you like; I think they may be able to help you with weight loss We will refer you for  1. Thyroid ultrasound 2.  Bone density scan 3. Sports medicine to look at your left hip  I will be in touch with your labs asap

## 2017-04-08 ENCOUNTER — Ambulatory Visit (HOSPITAL_BASED_OUTPATIENT_CLINIC_OR_DEPARTMENT_OTHER)
Admission: RE | Admit: 2017-04-08 | Discharge: 2017-04-08 | Disposition: A | Discharge status: Home / Self Care | Payer: BC Managed Care – PPO | Source: Ambulatory Visit | Attending: Family Medicine | Admitting: Family Medicine

## 2017-04-08 DIAGNOSIS — Z13820 Encounter for screening for osteoporosis: Secondary | ICD-10-CM | POA: Insufficient documentation

## 2017-04-08 DIAGNOSIS — E2839 Other primary ovarian failure: Secondary | ICD-10-CM | POA: Insufficient documentation

## 2017-04-08 DIAGNOSIS — E049 Nontoxic goiter, unspecified: Secondary | ICD-10-CM | POA: Diagnosis not present

## 2017-04-12 ENCOUNTER — Telehealth: Payer: Self-pay | Admitting: *Deleted

## 2017-04-12 NOTE — Telephone Encounter (Signed)
Pt was not aware that she needed to have labs drawn during her last visit. Pt scheduled this week to have those labs done.

## 2017-04-12 NOTE — Telephone Encounter (Signed)
-----   Message from Darreld Mclean, MD sent at 04/12/2017  8:23 AM EDT ----- Her labs did not come in ----- Message ----- From: SYSTEM Sent: 04/08/2017  12:05 AM To: Darreld Mclean, MD

## 2017-04-13 ENCOUNTER — Ambulatory Visit (INDEPENDENT_AMBULATORY_CARE_PROVIDER_SITE_OTHER): Payer: BC Managed Care – PPO | Admitting: Family Medicine

## 2017-04-13 ENCOUNTER — Encounter: Payer: Self-pay | Admitting: Family Medicine

## 2017-04-13 ENCOUNTER — Other Ambulatory Visit: Payer: BC Managed Care – PPO

## 2017-04-13 DIAGNOSIS — M25552 Pain in left hip: Secondary | ICD-10-CM | POA: Diagnosis not present

## 2017-04-13 LAB — COMPREHENSIVE METABOLIC PANEL
ALK PHOS: 72 U/L (ref 39–117)
ALT: 15 U/L (ref 0–35)
AST: 15 U/L (ref 0–37)
Albumin: 3.8 g/dL (ref 3.5–5.2)
BILIRUBIN TOTAL: 0.4 mg/dL (ref 0.2–1.2)
BUN: 11 mg/dL (ref 6–23)
CALCIUM: 8.8 mg/dL (ref 8.4–10.5)
CO2: 31 meq/L (ref 19–32)
Chloride: 107 mEq/L (ref 96–112)
Creatinine, Ser: 0.75 mg/dL (ref 0.40–1.20)
GFR: 99.9 mL/min (ref >60.00)
Glucose, Bld: 107 mg/dL — ABNORMAL HIGH (ref 70–99)
POTASSIUM: 3.4 meq/L — AB (ref 3.5–5.1)
Sodium: 142 mEq/L (ref 135–145)
Total Protein: 7.1 g/dL (ref 6.0–8.3)

## 2017-04-13 LAB — CBC
HCT: 41.7 % (ref 36.0–46.0)
HEMOGLOBIN: 14 g/dL (ref 12.0–15.0)
MCHC: 33.5 g/dL (ref 30.0–36.0)
MCV: 88.2 fl (ref 78.0–100.0)
PLATELETS: 265 10*3/uL (ref 150.0–400.0)
RBC: 4.72 Mil/uL (ref 3.87–5.11)
RDW: 14.3 % (ref 11.5–15.5)
WBC: 3.8 10*3/uL — ABNORMAL LOW (ref 4.0–10.5)

## 2017-04-13 LAB — T3, FREE: T3 FREE: 3.5 pg/mL (ref 2.3–4.2)

## 2017-04-13 LAB — HEMOGLOBIN A1C: Hgb A1c MFr Bld: 5.8 % (ref 4.6–6.5)

## 2017-04-13 LAB — TSH: TSH: 0.48 u[IU]/mL (ref 0.35–4.50)

## 2017-04-13 MED ORDER — PREDNISONE 10 MG PO TABS: ORAL_TABLET | ORAL | 0 refills | Status: DC

## 2017-04-13 NOTE — Patient Instructions (Signed)
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 Ice over area of pain 3-4 times a day for 15 minutes at a time Hip side raise and standing hip rotations - work your way up to doing 3 sets of 10 once a day - add weights if these become too easy. Stretches - pick 2 and hold for 20-30 seconds x 3 - do once or twice a day. Prednisone dose pack as directed for 6 days. If you're not improving over the next week I would recommend you contact your back doctor as you may have an irritated low lumbar or S1 nerve that is mimicking IT band syndrome.

## 2017-04-14 ENCOUNTER — Encounter: Payer: Self-pay | Admitting: Family Medicine

## 2017-04-18 DIAGNOSIS — M25552 Pain in left hip: Secondary | ICD-10-CM | POA: Insufficient documentation

## 2017-04-18 NOTE — Assessment & Plan Note (Signed)
consistent with IT band syndrome.  Shown home exercises, stretches to do daily.  Bursal injection did not provide benefit.  Discussed possibility of irritated low lumbar or S1 nerve causing similar symptoms - trial prednisone dose pack and contact us in a week - if not improving advised contacting physician who manages her chronic back pain for evaluation.

## 2017-04-18 NOTE — Progress Notes (Signed)
PCP and consultation requested by: Copland, Gay Filler, MD  Subjective:   HPI: Patient is a 66 y.o. female here for left hip pain.  Patient reports for about 2 months she's had pain lateral aspect of left hip radiating to her knee. Started when she had to go up and down to/from attic moving totes and boxes. Pain worse with walking,stairs, going from sitting to standing. No swelling or bruising. Tried tylenol, aleve, heat, ice. Pain level up to 8/10 and sharp. Trochanteric bursa injection without benefit also. No skin changes, numbness.  Past Medical History:  Diagnosis Date  . Allergy   . Arthritis   . Depression   . Dyslipidemia   . GERD (gastroesophageal reflux disease)   . Headaches due to old head injury   . Hypertension   . Obesity   . Reflux   . Thyroid disease     Current Outpatient Prescriptions on File Prior to Visit  Medication Sig Dispense Refill  . acetaminophen-codeine (TYLENOL #3) 300-30 MG tablet Take 1-2 tablets by mouth every 8 (eight) hours as needed for moderate pain. 30 tablet 0  . ALPRAZolam (XANAX) 0.25 MG tablet Take 0.25 mg by mouth 2 (two) times daily as needed for sleep.    Marland Kitchen amLODipine (NORVASC) 10 MG tablet TAKE 1 TABLET BY MOUTH DAILY 30 tablet 11  . aspirin 81 MG tablet Take 81 mg by mouth daily.    . carvedilol (COREG) 3.125 MG tablet Take 1 tablet (3.125 mg total) by mouth 2 (two) times daily. 60 tablet 2  . desvenlafaxine (PRISTIQ) 100 MG 24 hr tablet Take 100 mg by mouth daily.    . diclofenac sodium (VOLTAREN) 1 % GEL Apply 2 g topically 4 (four) times daily. 100 g 3  . Eszopiclone (ESZOPICLONE) 3 MG TABS Take 3 mg by mouth at bedtime. Take immediately before bedtime    . frovatriptan (FROVA) 2.5 MG tablet Take 2.5 mg by mouth as needed for migraine. If recurs, may repeat after 2 hours. Max of 3 tabs in 24 hours.    . gabapentin (NEURONTIN) 300 MG capsule Take 300 mg by mouth 3 (three) times daily.    Marland Kitchen ipratropium (ATROVENT) 0.03 % nasal  spray Place 2 sprays into the nose 4 (four) times daily. 30 mL 1  . nitroGLYCERIN (NITROSTAT) 0.4 MG SL tablet Place 1 tablet (0.4 mg total) under the tongue every 5 (five) minutes as needed. 30 tablet 0  . Olopatadine HCl (PATADAY) 0.2 % SOLN Apply 1 drop to eye daily as needed (for itching). 2.5 mL 3  . omeprazole (PRILOSEC) 40 MG capsule Take 40 mg by mouth daily.    . valACYclovir (VALTREX) 500 MG tablet Take 1 tablet (500 mg total) by mouth 2 (two) times daily. Use for 3 days as needed for outbreak 30 tablet 2   No current facility-administered medications on file prior to visit.     Past Surgical History:  Procedure Laterality Date  . ABDOMINAL HYSTERECTOMY    . BREAST LUMPECTOMY WITH RADIOACTIVE SEED LOCALIZATION Left 08/23/2016   Procedure: LEFT BREAST LUMPECTOMY WITH RADIOACTIVE SEED LOCALIZATION;  Surgeon: Rolm Bookbinder, MD;  Location: [facility];  Service: General;  Laterality: Left;  . BREAST SURGERY  2003   reduction  . CESAREAN SECTION    . FACET JOINT INJECTION  2011 and 2012  . MYOMECTOMY    . NM MYOCAR PERF WALL MOTION  03/2009   dipyridamole; normal pattern of perfusion in all regions, post-stress EF  65%, normal study   . THYROIDECTOMY  1989  . TRANSTHORACIC ECHOCARDIOGRAM  03/2009   EF=>55%, borderline conc LVH; mild mitral annular calcif, trace MR; trace TR; AV mildly sclerotic, mild AV regurg; mild pulm valve regurg   . TUBAL LIGATION      Allergies  Allergen Reactions  . Metoprolol Hives  . Onabotulinumtoxina (Cosmetic) Swelling  . Botox [Botulinum Toxin Type A]   . Hydrochlorothiazide Other (See Comments)    Increases risk of gout flares  . Toprol Xl [Metoprolol Succinate] Hives    Social History   Social History  . Marital status: Married    Spouse name: N/A  . Number of children: 2  . Years of education: N/A   Occupational History  .  [place] History Main Topics  . Smoking status: Never Smoker  .  Smokeless tobacco: Never Used  . Alcohol use 0.5 - 1.0 oz/week    1 - 2 Standard drinks or equivalent per week  . Drug use: No  . Sexual activity: Not on file   Other Topics Concern  . Not on file   Social History Narrative  . No narrative on file    Family History  Problem Relation Age of Onset  . Heart disease Mother        MI, HTN  . Hypertension Mother   . Thyroid disease Mother   . Heart disease Maternal Grandmother        MI, CVA  . Heart disease Maternal Grandfather        MI  . Stroke Maternal Grandfather   . Heart disease Paternal Grandmother   . Stroke Paternal Grandmother   . Heart disease Paternal Grandfather        MI  . Glaucoma Father   . Diabetes Father   . Depression Daughter   . ADD / ADHD Son   . Thyroid disease Sister   . Heart attack Paternal Uncle        HTN  . Hypertension Paternal Uncle     BP (!) 142/83   Pulse 63   Ht 5\' 4"  (1.626 m)   Wt 222 lb (100.7 kg)   BMI 38.11 kg/m   Review of Systems: See HPI above.     Objective:  Physical Exam:  Gen: NAD, comfortable in exam room  Back/left hip: No gross deformity, scoliosis. TTP proximal IT band.  No other hip, back tenderness. FROM. Strength LEs 5/5 all muscle groups except 4/5 hip abduction.   2+ MSRs in patellar and achilles tendons, equal bilaterally. Negative SLRs. Sensation intact to light touch bilaterally. Negative logroll bilateral hips Negative fabers and piriformis stretches.   Assessment & Plan:  1. Left hip pain - consistent with IT band syndrome.  Shown home exercises, stretches to do daily.  Bursal injection did not provide benefit.  Discussed possibility of irritated low lumbar or S1 nerve causing similar symptoms - trial prednisone dose pack and contact us in a week - if not improving advised contacting physician who manages her chronic back pain for evaluation.

## 2017-06-22 ENCOUNTER — Encounter (INDEPENDENT_AMBULATORY_CARE_PROVIDER_SITE_OTHER): Payer: BC Managed Care – PPO | Admitting: Family Medicine

## 2017-06-25 ENCOUNTER — Encounter (INDEPENDENT_AMBULATORY_CARE_PROVIDER_SITE_OTHER): Payer: Self-pay | Admitting: Family Medicine

## 2017-06-29 ENCOUNTER — Encounter (INDEPENDENT_AMBULATORY_CARE_PROVIDER_SITE_OTHER): Payer: Self-pay | Admitting: Family Medicine

## 2017-06-29 ENCOUNTER — Ambulatory Visit (INDEPENDENT_AMBULATORY_CARE_PROVIDER_SITE_OTHER): Payer: BC Managed Care – PPO | Admitting: Family Medicine

## 2017-06-29 VITALS — BP 144/78 | HR 53 | Temp 98.2°F | Resp 14 | Ht 63.0 in | Wt 221.0 lb

## 2017-06-29 DIAGNOSIS — R5383 Other fatigue: Secondary | ICD-10-CM

## 2017-06-29 DIAGNOSIS — Z1389 Encounter for screening for other disorder: Secondary | ICD-10-CM | POA: Diagnosis not present

## 2017-06-29 DIAGNOSIS — IMO0001 Reserved for inherently not codable concepts without codable children: Secondary | ICD-10-CM

## 2017-06-29 DIAGNOSIS — I10 Essential (primary) hypertension: Secondary | ICD-10-CM

## 2017-06-29 DIAGNOSIS — E784 Other hyperlipidemia: Secondary | ICD-10-CM

## 2017-06-29 DIAGNOSIS — Z0289 Encounter for other administrative examinations: Secondary | ICD-10-CM

## 2017-06-29 DIAGNOSIS — Z6839 Body mass index (BMI) 39.0-39.9, adult: Secondary | ICD-10-CM | POA: Diagnosis not present

## 2017-06-29 DIAGNOSIS — E669 Obesity, unspecified: Secondary | ICD-10-CM

## 2017-06-29 DIAGNOSIS — R0602 Shortness of breath: Secondary | ICD-10-CM

## 2017-06-29 DIAGNOSIS — R7303 Prediabetes: Secondary | ICD-10-CM | POA: Insufficient documentation

## 2017-06-29 DIAGNOSIS — E7849 Other hyperlipidemia: Secondary | ICD-10-CM | POA: Insufficient documentation

## 2017-06-29 DIAGNOSIS — Z1331 Encounter for screening for depression: Secondary | ICD-10-CM

## 2017-06-29 DIAGNOSIS — R9431 Abnormal electrocardiogram [ECG] [EKG]: Secondary | ICD-10-CM

## 2017-06-29 NOTE — Progress Notes (Signed)
Office: [phone]  /  Fax: [phone]   Dear Dr. Lorelei Pont,   Thank you for referring [name] to our clinic. The following note includes my evaluation and treatment recommendations.  HPI:   Chief Complaint: OBESITY    ARLANDA SHIPLETT has been referred by Gay Filler. Copland, MD for consultation regarding her obesity and obesity related comorbidities.    BLAKELEY SCHEIER (MR# [id number]) is a 65 y.o. female who presents on 06/29/2017 for obesity evaluation and treatment. Current BMI is Body mass index is 39.15 kg/m.Marland Kitchen Mistee has been struggling with her weight for many years and has been unsuccessful in either losing weight, maintaining weight loss, or reaching her healthy weight goal.     Sausha attended our information session and states she is currently in the action stage of change and ready to dedicate time achieving and maintaining a healthier weight. Lennis is interested in becoming our patient and working on intensive lifestyle modifications including (but not limited to) diet, exercise and weight loss.    Mata states she thinks her family will eat healthier with  her her desired weight loss is 72 lbs she has been heavy most of  her life she started gaining weight after hysterectomy her heaviest weight ever was 222 lbs. she has significant food cravings issues  she snacks frequently in the evenings she skips meals frequently she is frequently drinking liquids with calories she frequently makes poor food choices she frequently eats larger portions than normal  she has binge eating behaviors she struggles with emotional eating    Fatigue Arra feels her energy is lower than it should be. This has worsened with weight gain and has not worsened recently. Zalea admits to daytime somnolence and  denies waking up still tired. Patient is at risk for obstructive sleep apnea. Patent has a history of symptoms of daytime fatigue and  hypertension. Patient generally gets 8 hours of sleep per night, and states they generally have restful sleep. Snoring is present. Apneic episodes are not present. Epworth Sleepiness Score is 6  Dyspnea on exertion Detrice notes increasing shortness of breath with exercising and seems to be worsening over time with weight gain. She notes getting out of breath sooner with activity than she used to. This has not gotten worse recently. Swetha denies orthopnea.  Abnormal EKG Eisa had an abnormal EKG today with new onset heart murmur and EKG suggests Left Ventricular Hypertrophy. She admits shortness of breath with activity but not at rest. She denies orthopnea.  Hypertension MYIA BERGH is a 66 y.o. female with hypertension. Her blood pressure is elevated today on medications. [name] denies chest pain or headache. She is attempting weight loss to help control her blood pressure with the goal of decreasing her risk of heart attack and stroke. Gretchens blood pressure is not currently controlled.  Hyperlipidemia Chezney has hyperlipidemia and she is not on a statin. She would like to improve her cholesterol levels with intensive lifestyle modification including a low saturated fat diet, exercise and weight loss. She denies any chest pain, claudication or myalgias.  Pre-Diabetes Jentry has a diagnosis of pre-diabetes based on her elevated Hgb A1c and was informed this puts her at greater risk of developing diabetes. She is not taking metformin currently and continues to work on diet and exercise to decrease risk of diabetes. She admits polyphagia and denies nausea or hypoglycemia.  Depression Screen Sharea's Food and Mood (modified PHQ-9) score was  Depression screen [facility] 2/9  06/29/2017  Decreased Interest 3  Down, Depressed, Hopeless 2  PHQ - 2 Score 5  Altered sleeping 1  Tired, decreased energy 3  Change in appetite 3  Feeling bad or failure about yourself   1  Trouble concentrating 1  Moving slowly or fidgety/restless 2  Suicidal thoughts 0  PHQ-9 Score 16    ALLERGIES: Allergies  Allergen Reactions  . Metoprolol Hives  . Onabotulinumtoxina (Cosmetic) Swelling  . Botox [Botulinum Toxin Type A]   . Hydrochlorothiazide Other (See Comments)    Increases risk of gout flares  . Toprol Xl [Metoprolol Succinate] Hives    MEDICATIONS: Current Outpatient Prescriptions on File Prior to Visit  Medication Sig Dispense Refill  . acetaminophen-codeine (TYLENOL #3) 300-30 MG tablet Take 1-2 tablets by mouth every 8 (eight) hours as needed for moderate pain. 30 tablet 0  . ALPRAZolam (XANAX) 0.25 MG tablet Take 0.25 mg by mouth 2 (two) times daily as needed for sleep.    Marland Kitchen amLODipine (NORVASC) 10 MG tablet TAKE 1 TABLET BY MOUTH DAILY 30 tablet 11  . aspirin 81 MG tablet Take 81 mg by mouth daily.    . carvedilol (COREG) 3.125 MG tablet Take 1 tablet (3.125 mg total) by mouth 2 (two) times daily. 60 tablet 2  . desvenlafaxine (PRISTIQ) 100 MG 24 hr tablet Take 100 mg by mouth daily.    . diclofenac sodium (VOLTAREN) 1 % GEL Apply 2 g topically 4 (four) times daily. 100 g 3  . Eszopiclone (ESZOPICLONE) 3 MG TABS Take 3 mg by mouth at bedtime. Take immediately before bedtime    . frovatriptan (FROVA) 2.5 MG tablet Take 2.5 mg by mouth as needed for migraine. If recurs, may repeat after 2 hours. Max of 3 tabs in 24 hours.    . gabapentin (NEURONTIN) 300 MG capsule Take 300 mg by mouth 3 (three) times daily.    Marland Kitchen ipratropium (ATROVENT) 0.03 % nasal spray Place 2 sprays into the nose 4 (four) times daily. 30 mL 1  . nitroGLYCERIN (NITROSTAT) 0.4 MG SL tablet Place 1 tablet (0.4 mg total) under the tongue every 5 (five) minutes as needed. 30 tablet 0  . Olopatadine HCl (PATADAY) 0.2 % SOLN Apply 1 drop to eye daily as needed (for itching). 2.5 mL 3  . valACYclovir (VALTREX) 500 MG tablet Take 1 tablet (500 mg total) by mouth 2 (two) times daily. Use for 3  days as needed for outbreak 30 tablet 2   No current facility-administered medications on file prior to visit.     PAST MEDICAL HISTORY: Past Medical History:  Diagnosis Date  . Allergy   . Anxiety   . Arthritis   . Back pain   . Constipation   . Depression   . Dyslipidemia   . GERD (gastroesophageal reflux disease)   . Headaches due to old head injury   . History of stomach ulcers   . Hyperlipidemia   . Hypertension   . Lactose intolerance   . Migraines   . Obesity   . Prediabetes   . Reflux   . Thyroid disease     PAST SURGICAL HISTORY: Past Surgical History:  Procedure Laterality Date  . ABDOMINAL HYSTERECTOMY    . BREAST LUMPECTOMY WITH RADIOACTIVE SEED LOCALIZATION Left 08/23/2016   Procedure: LEFT BREAST LUMPECTOMY WITH RADIOACTIVE SEED LOCALIZATION;  Surgeon: Rolm Bookbinder, MD;  Location: [place];  Service: General;  Laterality: Left;  . BREAST SURGERY  2003   reduction  .  CESAREAN SECTION    . FACET JOINT INJECTION  2011 and 2012  . MYOMECTOMY    . NM MYOCAR PERF WALL MOTION  03/2009   dipyridamole; normal pattern of perfusion in all regions, post-stress EF 65%, normal study   . THYROIDECTOMY  1989  . TRANSTHORACIC ECHOCARDIOGRAM  03/2009   EF=>55%, borderline conc LVH; mild mitral annular calcif, trace MR; trace TR; AV mildly sclerotic, mild AV regurg; mild pulm valve regurg   . TUBAL LIGATION      SOCIAL HISTORY: Social History  Substance Use Topics  . Smoking status: Never Smoker  . Smokeless tobacco: Never Used  . Alcohol use 0.5 - 1.0 oz/week    1 - 2 Standard drinks or equivalent per week    FAMILY HISTORY: Family History  Problem Relation Age of Onset  . Heart disease Mother        MI, HTN  . Hypertension Mother   . Thyroid disease Mother   . Hyperlipidemia Mother   . Sudden death Mother   . Anxiety disorder Mother   . Heart disease Maternal Grandmother        MI, CVA  . Heart disease Maternal Grandfather        MI   . Stroke Maternal Grandfather   . Heart disease Paternal Grandmother   . Stroke Paternal Grandmother   . Heart disease Paternal Grandfather        MI  . Glaucoma Father   . Diabetes Father   . Depression Daughter   . ADD / ADHD Son   . Thyroid disease Sister   . Heart attack Paternal Uncle        HTN  . Hypertension Paternal Uncle     ROS: Review of Systems  Constitutional: Positive for malaise/fatigue.  HENT: Positive for hearing loss.        Hay Fever Dry Mouth  Eyes:       Wear Glasses or Contacts Floaters  Respiratory: Positive for shortness of breath (on exertion) and wheezing.   Cardiovascular: Negative for chest pain, orthopnea and claudication.  Gastrointestinal: Positive for constipation. Negative for nausea.  Genitourinary: Positive for frequency.  Musculoskeletal: Positive for back pain. Negative for myalgias.  Skin:       Dryness   Neurological: Positive for headaches.       Head Injury  Endo/Heme/Allergies: Positive for polydipsia.       Polyphagia Negative hypoglycemia  Psychiatric/Behavioral: Positive for depression.    PHYSICAL EXAM: Blood pressure (!) 144/78, pulse (!) 53, temperature 98.2 F (36.8 C), temperature source Oral, resp. rate 14, height _0  (1.6 m), weight 221 lb (100.2 kg), SpO2 98 %. Body mass index is 39.15 kg/m. Physical Exam  Constitutional: She is oriented to person, place, and time. She appears well-developed and well-nourished.  Cardiovascular:  Distant S4 Heart Sound @ Left Sternal Border  Pulmonary/Chest: Effort normal.  Musculoskeletal: Normal range of motion.  Neurological: She is oriented to person, place, and time.  Skin: Skin is warm and dry.  Psychiatric: She has a normal mood and affect. Her behavior is normal.    RECENT LABS AND TESTS: BMET    Component Value Date/Time   NA 142 04/13/2017 0858   K 3.4 (L) 04/13/2017 0858   CL 107 04/13/2017 0858   CO2 31 04/13/2017 0858   GLUCOSE 107 (H) 04/13/2017 0858    BUN 11 04/13/2017 0858   CREATININE 0.75 04/13/2017 0858   CREATININE 0.73 09/29/2015 0935   CALCIUM 8.8  04/13/2017 0858   GFRNONAA >60 01/11/2009 0530   GFRAA  01/11/2009 0530    >60        The eGFR has been calculated using the MDRD equation. This calculation has not been validated in all clinical situations. eGFR's persistently <60 mL/min signify possible Chronic Kidney Disease.   Lab Results  Component Value Date   HGBA1C 5.8 04/13/2017   No results found for: INSULIN CBC    Component Value Date/Time   WBC 3.8 (L) 04/13/2017 0858   RBC 4.72 04/13/2017 0858   HGB 14.0 04/13/2017 0858   HCT 41.7 04/13/2017 0858   PLT 265.0 04/13/2017 0858   MCV 88.2 04/13/2017 0858   MCV 93.1 10/08/2013 0911   MCH 29.1 08/25/2014 1002   MCHC 33.5 04/13/2017 0858   RDW 14.3 04/13/2017 0858   LYMPHSABS 1.6 01/09/2009 0902   MONOABS 0.4 01/09/2009 0902   EOSABS 0.1 01/09/2009 0902   BASOSABS 0.0 01/09/2009 0902   Iron/TIBC/Ferritin/ %Sat No results found for: IRON, TIBC, FERRITIN, IRONPCTSAT Lipid Panel     Component Value Date/Time   CHOL 211 (H) 08/08/2016 0828   CHOL 206 (H) 12/19/2013 0820   TRIG 85 08/08/2016 0828   TRIG 111 12/19/2013 0820   HDL 70 08/08/2016 0828   HDL 70 12/19/2013 0820   CHOLHDL 3.0 08/08/2016 0828   VLDL 17 08/08/2016 0828   LDLCALC 124 08/08/2016 0828   LDLCALC 114 (H) 12/19/2013 0820   Hepatic Function Panel     Component Value Date/Time   PROT 7.1 04/13/2017 0858   ALBUMIN 3.8 04/13/2017 0858   AST 15 04/13/2017 0858   ALT 15 04/13/2017 0858   ALKPHOS 72 04/13/2017 0858   BILITOT 0.4 04/13/2017 0858   BILIDIR <0.1 01/09/2009 1604   IBILI NOT CALCULATED 01/09/2009 1604      Component Value Date/Time   TSH 0.48 04/13/2017 0858   TSH 0.70 12/29/2014 0923   TSH 0.14 (L) 10/29/2014 1002    ECG  shows NSR with a rate of 64 BPM INDIRECT CALORIMETER done today shows a VO2 of 191 and a REE of 1327.  Her calculated basal metabolic rate  is 3007 thus her basal metabolic rate is worse than expected.    ASSESSMENT AND PLAN: Other fatigue - Plan: EKG 12-Lead, CBC With Differential, T3, T4, free, TSH, VITAMIN D 25 Hydroxy (Vit-D Deficiency, Fractures), Vitamin B12, Folate  Shortness of breath on exertion  Essential hypertension  Prediabetes - Plan: Comprehensive metabolic panel, Hemoglobin A1c, Insulin, random  Other hyperlipidemia - Plan: Lipid Panel With LDL/HDL Ratio  Abnormal EKG - Plan: ECHOCARDIOGRAM COMPLETE  Depression screening  Class 2 obesity with serious comorbidity and body mass index (BMI) of 39.0 to 39.9 in adult, unspecified obesity type  PLAN: Fatigue Dayonna was informed that her fatigue may be related to obesity, depression or many other causes. Labs will be ordered, and in the meanwhile Lenee has agreed to work on diet, exercise and weight loss to help with fatigue. Proper sleep hygiene was discussed including the need for 7-8 hours of quality sleep each night. A sleep study was not ordered based on symptoms and Epworth score.  Dyspnea on exertion Francy's shortness of breath appears to be obesity related and exercise induced. She has agreed to work on weight loss and gradually increase exercise to treat her exercise induced shortness of breath. If Dorsie follows our instructions and loses weight without improvement of her shortness of breath, we will plan to refer to pulmonology.  We will monitor this condition regularly. Liseth agrees to this plan.  Abnormal EKG We will refer Sharrell to [facility] [phone], date to be determined, Auth# [id number] from 06/28/17 to 07/28/17 and we will follow.   Hypertension We discussed sodium restriction, working on healthy weight loss, and a regular exercise program as the means to achieve improved blood pressure control. Amaris agreed with this plan and agreed to follow up as directed. We will continue to monitor her blood pressure as  well as her progress with the above lifestyle modifications. She will continue her medications as prescribed and will watch for signs of hypotension as she continues her lifestyle modifications.  Hyperlipidemia Othell was informed of the American Heart Association Guidelines emphasizing intensive lifestyle modifications as the first line treatment for hyperlipidemia. We discussed many lifestyle modifications today in depth, and Donnarae will continue to work on decreasing saturated fats such as fatty red meat, butter and many fried foods. She will also increase vegetables and lean protein in her diet and continue to work on exercise and weight loss efforts. We will check labs and Tristan agrees to follow up with our clinic in 2 weeks.  Pre-Diabetes Michole will continue to work on weight loss, exercise, and decreasing simple carbohydrates in her diet to help decrease the risk of diabetes.  She was informed that eating too many simple carbohydrates or too many calories at one sitting increases the likelihood of GI side effects. We will check labs and Mykhia agreed to follow up with [place] as directed to monitor her progress.  Depression Screen Maliha had a strongly positive depression screening. Depression is commonly associated with obesity and often results in emotional eating behaviors. We will monitor this closely and work on CBT to help improve the non-hunger eating patterns. Referral to Psychology may be required if no improvement is seen as she continues in our clinic.  Obesity Ailish is currently in the action stage of change and her goal is to continue with weight loss efforts. I recommend Leina begin the structured treatment plan as follows:  She has agreed to follow the Category 2 plan Chanin has been instructed to eventually work up to a goal of 150 minutes of combined cardio and strengthening exercise per week for weight loss and overall health benefits. We discussed the  following Behavioral Modification Strategies today: meal planning & cooking strategies, increasing lean protein intake and dealing with family or coworker sabotage   She was informed of the importance of frequent follow up visits to maximize her success with intensive lifestyle modifications for her multiple health conditions. She was informed we would discuss her lab results at her next visit unless there is a critical issue that needs to be addressed sooner. Charliee agreed to keep her next visit at the agreed upon time to discuss these results.  I, Doreene Nest, am acting as transcriptionist for Dennard Nip, MD  I have reviewed the above documentation for accuracy and completeness, and I agree with the above. -Dennard Nip, MD    OBESITY BEHAVIORAL INTERVENTION VISIT  Today's visit was # 1 out of 34.  Starting weight: 221 lbs Starting date: 06/29/17 Today's weight : 221 lbs Today's date: 06/29/2017 Total lbs lost to date: 0 (Patients must lose 7 lbs in the first 6 months to continue with counseling)   ASK: We discussed the diagnosis of obesity with [name] today and Jasie agreed to give [place] permission to discuss obesity behavioral modification therapy today.  ASSESS:  Patrena has the diagnosis of obesity and her BMI today is 39.2 Fernanda is in the action stage of change   ADVISE: Emmakate was educated on the multiple health risks of obesity as well as the benefit of weight loss to improve her health. She was advised of the need for long term treatment and the importance of lifestyle modifications.  AGREE: Multiple dietary modification options and treatment options were discussed and  Markee agreed to follow the Category 2 plan We discussed the following Behavioral Modification Strategies today: meal planning & cooking strategies, increasing lean protein intake and dealing with family or coworker sabotage

## 2017-06-30 LAB — COMPREHENSIVE METABOLIC PANEL
A/G RATIO: 1.7 (ref 1.2–2.2)
ALBUMIN: 4 g/dL (ref 3.6–4.8)
ALK PHOS: 73 IU/L (ref 39–117)
ALT: 16 IU/L (ref 0–32)
AST: 17 IU/L (ref 0–40)
BILIRUBIN TOTAL: 0.3 mg/dL (ref 0.0–1.2)
BUN / CREAT RATIO: 14 (ref 12–28)
BUN: 10 mg/dL (ref 8–27)
CHLORIDE: 101 mmol/L (ref 96–106)
CO2: 27 mmol/L (ref 20–29)
Calcium: 8.6 mg/dL — ABNORMAL LOW (ref 8.7–10.3)
Creatinine, Ser: 0.71 mg/dL (ref 0.57–1.00)
GFR calc Af Amer: 104 mL/min/{1.73_m2} (ref >59)
GFR calc non Af Amer: 90 mL/min/{1.73_m2} (ref >59)
GLOBULIN, TOTAL: 2.3 g/dL (ref 1.5–4.5)
Glucose: 92 mg/dL (ref 65–99)
POTASSIUM: 3.4 mmol/L — AB (ref 3.5–5.2)
SODIUM: 143 mmol/L (ref 134–144)
Total Protein: 6.3 g/dL (ref 6.0–8.5)

## 2017-06-30 LAB — CBC WITH DIFFERENTIAL
BASOS ABS: 0 10*3/uL (ref 0.0–0.2)
BASOS: 0 %
EOS (ABSOLUTE): 0.1 10*3/uL (ref 0.0–0.4)
Eos: 2 %
Hematocrit: 39.9 % (ref 34.0–46.6)
Hemoglobin: 13.4 g/dL (ref 11.1–15.9)
IMMATURE GRANULOCYTES: 0 %
Immature Grans (Abs): 0 10*3/uL (ref 0.0–0.1)
Lymphocytes Absolute: 1.2 10*3/uL (ref 0.7–3.1)
Lymphs: 34 %
MCH: 29.6 pg (ref 26.6–33.0)
MCHC: 33.6 g/dL (ref 31.5–35.7)
MCV: 88 fL (ref 79–97)
MONOS ABS: 0.2 10*3/uL (ref 0.1–0.9)
Monocytes: 7 %
NEUTROS PCT: 57 %
Neutrophils Absolute: 2.1 10*3/uL (ref 1.4–7.0)
RBC: 4.53 x10E6/uL (ref 3.77–5.28)
RDW: 14.1 % (ref 12.3–15.4)
WBC: 3.6 10*3/uL (ref 3.4–10.8)

## 2017-06-30 LAB — VITAMIN D 25 HYDROXY (VIT D DEFICIENCY, FRACTURES): Vit D, 25-Hydroxy: 21.7 ng/mL — ABNORMAL LOW (ref 30.0–100.0)

## 2017-06-30 LAB — LIPID PANEL WITH LDL/HDL RATIO
Cholesterol, Total: 186 mg/dL (ref 100–199)
HDL: 52 mg/dL (ref >39)
LDL Calculated: 119 mg/dL — ABNORMAL HIGH (ref 0–99)
LDl/HDL Ratio: 2.3 ratio (ref 0.0–3.2)
Triglycerides: 77 mg/dL (ref 0–149)
VLDL CHOLESTEROL CAL: 15 mg/dL (ref 5–40)

## 2017-06-30 LAB — VITAMIN B12: VITAMIN B 12: 363 pg/mL (ref 232–1245)

## 2017-06-30 LAB — TSH: TSH: 0.503 u[IU]/mL (ref 0.450–4.500)

## 2017-06-30 LAB — HEMOGLOBIN A1C
Est. average glucose Bld gHb Est-mCnc: 117 mg/dL
HEMOGLOBIN A1C: 5.7 % — AB (ref 4.8–5.6)

## 2017-06-30 LAB — INSULIN, RANDOM: INSULIN: 13.1 u[IU]/mL (ref 2.6–24.9)

## 2017-06-30 LAB — T3: T3 TOTAL: 145 ng/dL (ref 71–180)

## 2017-06-30 LAB — FOLATE: Folate: 12.8 ng/mL (ref >3.0)

## 2017-06-30 LAB — T4, FREE: FREE T4: 1.13 ng/dL (ref 0.82–1.77)

## 2017-07-13 ENCOUNTER — Ambulatory Visit (INDEPENDENT_AMBULATORY_CARE_PROVIDER_SITE_OTHER): Payer: BC Managed Care – PPO | Admitting: Family Medicine

## 2017-07-13 VITALS — BP 170/95 | HR 73 | Temp 98.6°F | Ht 63.0 in | Wt 220.0 lb

## 2017-07-13 DIAGNOSIS — E669 Obesity, unspecified: Secondary | ICD-10-CM | POA: Diagnosis not present

## 2017-07-13 DIAGNOSIS — Z9189 Other specified personal risk factors, not elsewhere classified: Secondary | ICD-10-CM

## 2017-07-13 DIAGNOSIS — E559 Vitamin D deficiency, unspecified: Secondary | ICD-10-CM

## 2017-07-13 DIAGNOSIS — IMO0001 Reserved for inherently not codable concepts without codable children: Secondary | ICD-10-CM

## 2017-07-13 DIAGNOSIS — I10 Essential (primary) hypertension: Secondary | ICD-10-CM | POA: Diagnosis not present

## 2017-07-13 DIAGNOSIS — Z6839 Body mass index (BMI) 39.0-39.9, adult: Secondary | ICD-10-CM

## 2017-07-13 MED ORDER — VITAMIN D (ERGOCALCIFEROL) 1.25 MG (50000 UNIT) PO CAPS: 50000.0000 [IU] | ORAL_CAPSULE | ORAL | 0 refills | Status: DC

## 2017-07-13 NOTE — Progress Notes (Signed)
Office: [phone]  /  Fax: [phone]   HPI:   Chief Complaint: OBESITY [name] is here to discuss her progress with her obesity treatment plan. She is on the Category 2 plan and is following her eating plan approximately 25 % of the time. She states she is exercising 0 minutes 0 times per week. [name] was on vacation for the last 2 weeks. She mostly controlled her potions and made smarter choices but she is ready to get back on track and start following the category 2 plan strictly.  Her weight is 220 lb (99.8 kg) today and has had a weight loss of 1 pound over a period of 2 weeks since her last visit. She has lost 1 lb since starting treatment with [place].  Hypertension [name] is a 66 y.o. female with hypertension. [name]'s blood pressure was elevated at today's visit at 170/95 and was elevated during last visit but is worse today. [name] denies chest pain or shortness of breath on exertion. She is working weight loss to help control her blood pressure with the goal of decreasing her risk of heart attack and stroke. [name] blood pressure is not currently controlled.  Vitamin D deficiency [name] has a new diagnosis of vitamin D deficiency. She is not currently taking vit D and she admits fatigue but denies nausea, vomiting or muscle weakness.  At risk for osteopenia [name] is at higher risk of osteopenia and osteoporosis due to vitamin D deficiency.    ALLERGIES: Allergies  Allergen Reactions  . Metoprolol Hives  . Onabotulinumtoxina (Cosmetic) Swelling  . Botox [Botulinum Toxin Type A]   . Hydrochlorothiazide Other (See Comments)    Increases risk of gout flares  . Toprol Xl [Metoprolol Succinate] Hives    MEDICATIONS: Current Outpatient Prescriptions on File Prior to Visit  Medication Sig Dispense Refill  . acetaminophen-codeine (TYLENOL #3) 300-30 MG tablet Take 1-2 tablets by mouth every 8 (eight) hours as needed for moderate  pain. 30 tablet 0  . ALPRAZolam (XANAX) 0.25 MG tablet Take 0.25 mg by mouth 2 (two) times daily as needed for sleep.    Marland Kitchen amLODipine (NORVASC) 10 MG tablet TAKE 1 TABLET BY MOUTH DAILY 30 tablet 11  . aspirin 81 MG tablet Take 81 mg by mouth daily.    . carvedilol (COREG) 3.125 MG tablet Take 1 tablet (3.125 mg total) by mouth 2 (two) times daily. 60 tablet 2  . desvenlafaxine (PRISTIQ) 100 MG 24 hr tablet Take 100 mg by mouth daily.    . diclofenac (FLECTOR) 1.3 % PTCH Place 1 patch onto the skin 2 (two) times daily.    . Diclofenac Potassium (CAMBIA) 50 MG PACK Take by mouth.    . diclofenac sodium (VOLTAREN) 1 % GEL Apply 2 g topically 4 (four) times daily. 100 g 3  . Eszopiclone (ESZOPICLONE) 3 MG TABS Take 3 mg by mouth at bedtime. Take immediately before bedtime    . fluconazole (DIFLUCAN) 150 MG tablet Take 150 mg by mouth daily.    . frovatriptan (FROVA) 2.5 MG tablet Take 2.5 mg by mouth as needed for migraine. If recurs, may repeat after 2 hours. Max of 3 tabs in 24 hours.    . gabapentin (NEURONTIN) 300 MG capsule Take 300 mg by mouth 3 (three) times daily.    Marland Kitchen ipratropium (ATROVENT) 0.03 % nasal spray Place 2 sprays into the nose 4 (four) times daily. 30 mL 1  . nitroGLYCERIN (NITROSTAT) 0.4 MG SL tablet  Place 1 tablet (0.4 mg total) under the tongue every 5 (five) minutes as needed. 30 tablet 0  . Olopatadine HCl (PATADAY) 0.2 % SOLN Apply 1 drop to eye daily as needed (for itching). 2.5 mL 3  . polyethylene glycol (MIRALAX / GLYCOLAX) packet Take 17 g by mouth daily.    . valACYclovir (VALTREX) 500 MG tablet Take 1 tablet (500 mg total) by mouth 2 (two) times daily. Use for 3 days as needed for outbreak 30 tablet 2   No current facility-administered medications on file prior to visit.     PAST MEDICAL HISTORY: Past Medical History:  Diagnosis Date  . Allergy   . Anxiety   . Arthritis   . Back pain   . Constipation   . Depression   . Dyslipidemia   . GERD  (gastroesophageal reflux disease)   . Headaches due to old head injury   . History of stomach ulcers   . Hyperlipidemia   . Hypertension   . Lactose intolerance   . Migraines   . Obesity   . Prediabetes   . Reflux   . Thyroid disease     PAST SURGICAL HISTORY: Past Surgical History:  Procedure Laterality Date  . ABDOMINAL HYSTERECTOMY    . BREAST LUMPECTOMY WITH RADIOACTIVE SEED LOCALIZATION Left 08/23/2016   Procedure: LEFT BREAST LUMPECTOMY WITH RADIOACTIVE SEED LOCALIZATION;  Surgeon: Rolm Bookbinder, MD;  Location: [facility];  Service: General;  Laterality: Left;  . BREAST SURGERY  2003   reduction  . CESAREAN SECTION    . FACET JOINT INJECTION  2011 and 2012  . MYOMECTOMY    . NM MYOCAR PERF WALL MOTION  03/2009   dipyridamole; normal pattern of perfusion in all regions, post-stress EF 65%, normal study   . THYROIDECTOMY  1989  . TRANSTHORACIC ECHOCARDIOGRAM  03/2009   EF=>55%, borderline conc LVH; mild mitral annular calcif, trace MR; trace TR; AV mildly sclerotic, mild AV regurg; mild pulm valve regurg   . TUBAL LIGATION      SOCIAL HISTORY: Social History  Substance Use Topics  . Smoking status: Never Smoker  . Smokeless tobacco: Never Used  . Alcohol use 0.5 - 1.0 oz/week    1 - 2 Standard drinks or equivalent per week    FAMILY HISTORY: Family History  Problem Relation Age of Onset  . Heart disease Mother        MI, HTN  . Hypertension Mother   . Thyroid disease Mother   . Hyperlipidemia Mother   . Sudden death Mother   . Anxiety disorder Mother   . Heart disease Maternal Grandmother        MI, CVA  . Heart disease Maternal Grandfather        MI  . Stroke Maternal Grandfather   . Heart disease Paternal Grandmother   . Stroke Paternal Grandmother   . Heart disease Paternal Grandfather        MI  . Glaucoma Father   . Diabetes Father   . Depression Daughter   . ADD / ADHD Son   . Thyroid disease Sister   . Heart attack Paternal  Uncle        HTN  . Hypertension Paternal Uncle     ROS: Review of Systems  Constitutional: Positive for malaise/fatigue and weight loss.  Respiratory: Negative for shortness of breath (on exertion).   Cardiovascular: Negative for chest pain.  Gastrointestinal: Negative for nausea and vomiting.  Musculoskeletal:  Negative muscle weakness    PHYSICAL EXAM: Blood pressure (!) 170/95, pulse 73, temperature 98.6 F (37 C), temperature source Oral, height 5\' 3"  (1.6 m), weight 220 lb (99.8 kg), SpO2 97 %. Body mass index is 38.97 kg/m. Physical Exam  Constitutional: She is oriented to person, place, and time. She appears well-developed and well-nourished.  Cardiovascular: Normal rate.   Pulmonary/Chest: Effort normal.  Musculoskeletal: Normal range of motion.  Neurological: She is oriented to person, place, and time.  Skin: Skin is warm and dry.  Psychiatric: She has a normal mood and affect. Her behavior is normal.  Vitals reviewed.   RECENT LABS AND TESTS: BMET    Component Value Date/Time   NA 143 06/29/2017 1043   K 3.4 (L) 06/29/2017 1043   CL 101 06/29/2017 1043   CO2 27 06/29/2017 1043   GLUCOSE 92 06/29/2017 1043   GLUCOSE 107 (H) 04/13/2017 0858   BUN 10 06/29/2017 1043   CREATININE 0.71 06/29/2017 1043   CREATININE 0.73 09/29/2015 0935   CALCIUM 8.6 (L) 06/29/2017 1043   GFRNONAA 90 06/29/2017 1043   GFRAA 104 06/29/2017 1043   Lab Results  Component Value Date   HGBA1C 5.7 (H) 06/29/2017   HGBA1C 5.8 04/13/2017   HGBA1C 5.9 02/04/2016   HGBA1C  01/09/2009    5.8 (NOTE)   The ADA recommends the following therapeutic goal for glycemic   control related to Hgb A1C measurement:   Goal of Therapy:   < 7.0% Hgb A1C   Reference: American Diabetes Association: Clinical Practice   Recommendations 2008, Diabetes Care,  2008, 31:(Suppl 1).   Lab Results  Component Value Date   INSULIN 13.1 06/29/2017   CBC    Component Value Date/Time   WBC 3.6  06/29/2017 1043   WBC 3.8 (L) 04/13/2017 0858   RBC 4.53 06/29/2017 1043   RBC 4.72 04/13/2017 0858   HGB 13.4 06/29/2017 1043   HCT 39.9 06/29/2017 1043   PLT 265.0 04/13/2017 0858   MCV 88 06/29/2017 1043   MCH 29.6 06/29/2017 1043   MCH 29.1 08/25/2014 1002   MCHC 33.6 06/29/2017 1043   MCHC 33.5 04/13/2017 0858   RDW 14.1 06/29/2017 1043   LYMPHSABS 1.2 06/29/2017 1043   MONOABS 0.4 01/09/2009 0902   EOSABS 0.1 06/29/2017 1043   BASOSABS 0.0 06/29/2017 1043   Iron/TIBC/Ferritin/ %Sat No results found for: IRON, TIBC, FERRITIN, IRONPCTSAT Lipid Panel     Component Value Date/Time   CHOL 186 06/29/2017 1043   CHOL 206 (H) 12/19/2013 0820   TRIG 77 06/29/2017 1043   TRIG 111 12/19/2013 0820   HDL 52 06/29/2017 1043   HDL 70 12/19/2013 0820   CHOLHDL 3.0 08/08/2016 0828   VLDL 17 08/08/2016 0828   LDLCALC 119 (H) 06/29/2017 1043   LDLCALC 114 (H) 12/19/2013 0820   Hepatic Function Panel     Component Value Date/Time   PROT 6.3 06/29/2017 1043   ALBUMIN 4.0 06/29/2017 1043   AST 17 06/29/2017 1043   ALT 16 06/29/2017 1043   ALKPHOS 73 06/29/2017 1043   BILITOT 0.3 06/29/2017 1043   BILIDIR <0.1 01/09/2009 1604   IBILI NOT CALCULATED 01/09/2009 1604      Component Value Date/Time   TSH 0.503 06/29/2017 1043   TSH 0.48 04/13/2017 0858   TSH 0.70 12/29/2014 0923    ASSESSMENT AND PLAN: Essential hypertension  Vitamin D deficiency - Plan: Vitamin D, Ergocalciferol, (DRISDOL) 50000 units CAPS capsule  At risk for osteopenia  Class 2 obesity with serious comorbidity and body mass index (BMI) of 39.0 to 39.9 in adult, unspecified obesity type  PLAN:  Hypertension We discussed sodium restriction, working on healthy weight loss, and a regular exercise program as the means to achieve improved blood pressure control. [name] agreed with this plan and agreed to follow up as directed. We will continue to monitor her blood pressure as well as her progress with the  above lifestyle modifications. She was advised to take her medications regularly and discuss with her PCP as soon as possible and we will recheck blood pressure in 2 weeks. She will watch for signs of hypotension as she continues her lifestyle modifications.  Vitamin D Deficiency [name] was informed that low vitamin D levels contributes to fatigue and are associated with obesity, breast, and colon cancer. She agrees to start to take prescription Vit D @50 ,000 IU every week #4 with no refills and will follow up for routine testing of vitamin D, at least 2-3 times per year. She was informed of the risk of over-replacement of vitamin D and agrees to not increase her dose unless he discusses this with [place] first. [name] agrees to follow up with our clinic in 2 weeks.  At risk for osteopenia [name] is at risk for osteopenia and osteoporsis due to her vitamin D deficiency. She was encouraged to take her vitamin D and follow her higher calcium diet and increase strengthening exercise to help strengthen her bones and decrease her risk of osteopenia and osteoporosis.  Obesity [name] is currently in the action stage of change. As such, her goal is to continue with weight loss efforts She has agreed to follow the Category 2 plan [name] has been instructed to work up to a goal of 150 minutes of combined cardio and strengthening exercise per week for weight loss and overall health benefits. We discussed the following Behavioral Modification Strategies today: increasing lean protein intake, decreasing simple carbohydrates , decreasing sodium intake and decrease eating out  [name] has agreed to follow up with our clinic in 2 weeks. She was informed of the importance of frequent follow up visits to maximize her success with intensive lifestyle modifications for her multiple health conditions.  I, [name], am acting as transcriptionist for Shelby Nip, MD  I have reviewed the above documentation  for accuracy and completeness, and I agree with the above. -Shelby Nip, MD   OBESITY BEHAVIORAL INTERVENTION VISIT  Today's visit was # 2 out of 67.  Starting weight: 221 lbs  Starting date: 06/29/17 Today's weight : 220 lbs  Today's date: 07/13/2017 Total lbs lost to date: 1 (Patients must lose 7 lbs in the first 6 months to continue with counseling)   ASK: We discussed the diagnosis of obesity with [name] today and [name] agreed to give [place] permission to discuss obesity behavioral modification therapy today.  ASSESS: Ineze has the diagnosis of obesity and her BMI today is 39.1 [place] is in the action stage of change   ADVISE: Cosima was educated on the multiple health risks of obesity as well as the benefit of weight loss to improve her health. She was advised of the need for long term treatment and the importance of lifestyle modifications.  AGREE: Multiple dietary modification options and treatment options were discussed and  Riana agreed to follow the Category 2 plan We discussed the following Behavioral Modification Strategies today: increasing lean protein intake, decreasing simple carbohydrates, decreasing sodium intake and decrease eating out

## 2017-07-27 ENCOUNTER — Ambulatory Visit (INDEPENDENT_AMBULATORY_CARE_PROVIDER_SITE_OTHER): Payer: BC Managed Care – PPO | Admitting: Physician Assistant

## 2017-07-27 VITALS — BP 133/76 | HR 72 | Temp 98.0°F | Ht 63.0 in | Wt 217.0 lb

## 2017-07-27 DIAGNOSIS — E669 Obesity, unspecified: Secondary | ICD-10-CM | POA: Diagnosis not present

## 2017-07-27 DIAGNOSIS — R7303 Prediabetes: Secondary | ICD-10-CM | POA: Diagnosis not present

## 2017-07-27 DIAGNOSIS — IMO0001 Reserved for inherently not codable concepts without codable children: Secondary | ICD-10-CM | POA: Insufficient documentation

## 2017-07-27 DIAGNOSIS — E559 Vitamin D deficiency, unspecified: Secondary | ICD-10-CM | POA: Diagnosis not present

## 2017-07-27 DIAGNOSIS — Z6838 Body mass index (BMI) 38.0-38.9, adult: Secondary | ICD-10-CM | POA: Diagnosis not present

## 2017-07-27 MED ORDER — VITAMIN D (ERGOCALCIFEROL) 1.25 MG (50000 UNIT) PO CAPS: 50000.0000 [IU] | ORAL_CAPSULE | ORAL | 0 refills | Status: DC

## 2017-07-27 MED ORDER — METFORMIN HCL 500 MG PO TABS: 500.0000 mg | ORAL_TABLET | Freq: Every day | ORAL | 0 refills | Status: DC

## 2017-07-27 NOTE — Progress Notes (Signed)
Office: [phone]  /  Fax: [phone]   HPI:   Chief Complaint: OBESITY [name] is here to discuss her progress with her obesity treatment plan. She is on the  follow the Category 2 plan and is following her eating plan approximately 30 % of the time. She states she is exercising 0 minutes 0 times per week. [name] continues to do well with weight loss. She has been trying to make smarter food choices but she still struggled with increased hunger and is snacking more. Her weight is 217 lb (98.4 kg) today and has had a weight loss of 3 pounds over a period of 2 weeks since her last visit. She has lost 4 lbs since starting treatment with [place].  Vitamin D deficiency [name] has a diagnosis of vitamin D deficiency. She is currently taking vit D and denies nausea, vomiting or muscle weakness.  Pre-Diabetes [name] has a diagnosis of pre-diabetes based on her elevated Hgb A1c and was informed this puts her at greater risk of developing diabetes. She is not taking metformin currently and continues to work on diet and exercise to decrease risk of diabetes. She noticed polyphagia in the evening but denies nausea or hypoglycemia.  ALLERGIES: Allergies  Allergen Reactions  . Metoprolol Hives  . Onabotulinumtoxina (Cosmetic) Swelling  . Botox [Botulinum Toxin Type A]   . Hydrochlorothiazide Other (See Comments)    Increases risk of gout flares  . Toprol Xl [Metoprolol Succinate] Hives    MEDICATIONS: Current Outpatient Prescriptions on File Prior to Visit  Medication Sig Dispense Refill  . acetaminophen-codeine (TYLENOL #3) 300-30 MG tablet Take 1-2 tablets by mouth every 8 (eight) hours as needed for moderate pain. 30 tablet 0  . ALPRAZolam (XANAX) 0.25 MG tablet Take 0.25 mg by mouth 2 (two) times daily as needed for sleep.    Marland Kitchen amLODipine (NORVASC) 10 MG tablet TAKE 1 TABLET BY MOUTH DAILY 30 tablet 11  . aspirin 81 MG tablet Take 81 mg by mouth daily.    . carvedilol (COREG)  3.125 MG tablet Take 1 tablet (3.125 mg total) by mouth 2 (two) times daily. 60 tablet 2  . desvenlafaxine (PRISTIQ) 100 MG 24 hr tablet Take 100 mg by mouth daily.    . diclofenac (FLECTOR) 1.3 % PTCH Place 1 patch onto the skin 2 (two) times daily.    . Diclofenac Potassium (CAMBIA) 50 MG PACK Take by mouth.    . diclofenac sodium (VOLTAREN) 1 % GEL Apply 2 g topically 4 (four) times daily. 100 g 3  . Eszopiclone (ESZOPICLONE) 3 MG TABS Take 3 mg by mouth at bedtime. Take immediately before bedtime    . fluconazole (DIFLUCAN) 150 MG tablet Take 150 mg by mouth daily.    . frovatriptan (FROVA) 2.5 MG tablet Take 2.5 mg by mouth as needed for migraine. If recurs, may repeat after 2 hours. Max of 3 tabs in 24 hours.    . gabapentin (NEURONTIN) 300 MG capsule Take 300 mg by mouth 3 (three) times daily.    Marland Kitchen ipratropium (ATROVENT) 0.03 % nasal spray Place 2 sprays into the nose 4 (four) times daily. 30 mL 1  . nitroGLYCERIN (NITROSTAT) 0.4 MG SL tablet Place 1 tablet (0.4 mg total) under the tongue every 5 (five) minutes as needed. 30 tablet 0  . Olopatadine HCl (PATADAY) 0.2 % SOLN Apply 1 drop to eye daily as needed (for itching). 2.5 mL 3  . polyethylene glycol (MIRALAX / GLYCOLAX) packet Take 17 g  by mouth daily.    . valACYclovir (VALTREX) 500 MG tablet Take 1 tablet (500 mg total) by mouth 2 (two) times daily. Use for 3 days as needed for outbreak 30 tablet 2   No current facility-administered medications on file prior to visit.     PAST MEDICAL HISTORY: Past Medical History:  Diagnosis Date  . Allergy   . Anxiety   . Arthritis   . Back pain   . Constipation   . Depression   . Dyslipidemia   . GERD (gastroesophageal reflux disease)   . Headaches due to old head injury   . History of stomach ulcers   . Hyperlipidemia   . Hypertension   . Lactose intolerance   . Migraines   . Obesity   . Prediabetes   . Reflux   . Thyroid disease     PAST SURGICAL HISTORY: Past Surgical  History:  Procedure Laterality Date  . ABDOMINAL HYSTERECTOMY    . BREAST LUMPECTOMY WITH RADIOACTIVE SEED LOCALIZATION Left 08/23/2016   Procedure: LEFT BREAST LUMPECTOMY WITH RADIOACTIVE SEED LOCALIZATION;  Surgeon: Rolm Bookbinder, MD;  Location: [facility];  Service: General;  Laterality: Left;  . BREAST SURGERY  2003   reduction  . CESAREAN SECTION    . FACET JOINT INJECTION  2011 and 2012  . MYOMECTOMY    . NM MYOCAR PERF WALL MOTION  03/2009   dipyridamole; normal pattern of perfusion in all regions, post-stress EF 65%, normal study   . THYROIDECTOMY  1989  . TRANSTHORACIC ECHOCARDIOGRAM  03/2009   EF=>55%, borderline conc LVH; mild mitral annular calcif, trace MR; trace TR; AV mildly sclerotic, mild AV regurg; mild pulm valve regurg   . TUBAL LIGATION      SOCIAL HISTORY: Social History  Substance Use Topics  . Smoking status: Never Smoker  . Smokeless tobacco: Never Used  . Alcohol use 0.5 - 1.0 oz/week    1 - 2 Standard drinks or equivalent per week    FAMILY HISTORY: Family History  Problem Relation Age of Onset  . Heart disease Mother        MI, HTN  . Hypertension Mother   . Thyroid disease Mother   . Hyperlipidemia Mother   . Sudden death Mother   . Anxiety disorder Mother   . Heart disease Maternal Grandmother        MI, CVA  . Heart disease Maternal Grandfather        MI  . Stroke Maternal Grandfather   . Heart disease Paternal Grandmother   . Stroke Paternal Grandmother   . Heart disease Paternal Grandfather        MI  . Glaucoma Father   . Diabetes Father   . Depression Daughter   . ADD / ADHD Son   . Thyroid disease Sister   . Heart attack Paternal Uncle        HTN  . Hypertension Paternal Uncle     ROS: Review of Systems  Constitutional: Positive for weight loss.  Gastrointestinal: Negative for nausea and vomiting.  Musculoskeletal:       Negative muscle weakness  Endo/Heme/Allergies:       Polyphagia Negative  hypoglycemia    PHYSICAL EXAM: Blood pressure 133/76, pulse 72, temperature 98 F (36.7 C), temperature source Oral, height 5\' 3"  (1.6 m), weight 217 lb (98.4 kg), SpO2 97 %. Body mass index is 38.44 kg/m. Physical Exam  Constitutional: She is oriented to person, place, and time. She appears  well-developed and well-nourished.  Cardiovascular: Normal rate.   Pulmonary/Chest: Effort normal.  Musculoskeletal: Normal range of motion.  Neurological: She is oriented to person, place, and time.  Skin: Skin is warm and dry.  Psychiatric: She has a normal mood and affect. Her behavior is normal.  Vitals reviewed.   RECENT LABS AND TESTS: BMET    Component Value Date/Time   NA 143 06/29/2017 1043   K 3.4 (L) 06/29/2017 1043   CL 101 06/29/2017 1043   CO2 27 06/29/2017 1043   GLUCOSE 92 06/29/2017 1043   GLUCOSE 107 (H) 04/13/2017 0858   BUN 10 06/29/2017 1043   CREATININE 0.71 06/29/2017 1043   CREATININE 0.73 09/29/2015 0935   CALCIUM 8.6 (L) 06/29/2017 1043   GFRNONAA 90 06/29/2017 1043   GFRAA 104 06/29/2017 1043   Lab Results  Component Value Date   HGBA1C 5.7 (H) 06/29/2017   HGBA1C 5.8 04/13/2017   HGBA1C 5.9 02/04/2016   HGBA1C  01/09/2009    5.8 (NOTE)   The ADA recommends the following therapeutic goal for glycemic   control related to Hgb A1C measurement:   Goal of Therapy:   < 7.0% Hgb A1C   Reference: American Diabetes Association: Clinical Practice   Recommendations 2008, Diabetes Care,  2008, 31:(Suppl 1).   Lab Results  Component Value Date   INSULIN 13.1 06/29/2017   CBC    Component Value Date/Time   WBC 3.6 06/29/2017 1043   WBC 3.8 (L) 04/13/2017 0858   RBC 4.53 06/29/2017 1043   RBC 4.72 04/13/2017 0858   HGB 13.4 06/29/2017 1043   HCT 39.9 06/29/2017 1043   PLT 265.0 04/13/2017 0858   MCV 88 06/29/2017 1043   MCH 29.6 06/29/2017 1043   MCH 29.1 08/25/2014 1002   MCHC 33.6 06/29/2017 1043   MCHC 33.5 04/13/2017 0858   RDW 14.1 06/29/2017 1043    LYMPHSABS 1.2 06/29/2017 1043   MONOABS 0.4 01/09/2009 0902   EOSABS 0.1 06/29/2017 1043   BASOSABS 0.0 06/29/2017 1043   Iron/TIBC/Ferritin/ %Sat No results found for: IRON, TIBC, FERRITIN, IRONPCTSAT Lipid Panel     Component Value Date/Time   CHOL 186 06/29/2017 1043   CHOL 206 (H) 12/19/2013 0820   TRIG 77 06/29/2017 1043   TRIG 111 12/19/2013 0820   HDL 52 06/29/2017 1043   HDL 70 12/19/2013 0820   CHOLHDL 3.0 08/08/2016 0828   VLDL 17 08/08/2016 0828   LDLCALC 119 (H) 06/29/2017 1043   LDLCALC 114 (H) 12/19/2013 0820   Hepatic Function Panel     Component Value Date/Time   PROT 6.3 06/29/2017 1043   ALBUMIN 4.0 06/29/2017 1043   AST 17 06/29/2017 1043   ALT 16 06/29/2017 1043   ALKPHOS 73 06/29/2017 1043   BILITOT 0.3 06/29/2017 1043   BILIDIR <0.1 01/09/2009 1604   IBILI NOT CALCULATED 01/09/2009 1604      Component Value Date/Time   TSH 0.503 06/29/2017 1043   TSH 0.48 04/13/2017 0858   TSH 0.70 12/29/2014 0923    ASSESSMENT AND PLAN: Prediabetes - Plan: metFORMIN (GLUCOPHAGE) 500 MG tablet  Vitamin D deficiency - Plan: Vitamin D, Ergocalciferol, (DRISDOL) 50000 units CAPS capsule  Class 2 obesity with serious comorbidity and body mass index (BMI) of 38.0 to 38.9 in adult, unspecified obesity type  PLAN:  Pre-Diabetes [name] will continue to work on weight loss, exercise, and decreasing simple carbohydrates in her diet to help decrease the risk of diabetes. We dicussed metformin including benefits and  risks. She was informed that eating too many simple carbohydrates or too many calories at one sitting increases the likelihood of GI side effects. [name] agrees to start metformin 500 mg qd #30 with no refills and will follow up with [place] as directed to monitor her progress.  Vitamin D Deficiency [name] was informed that low vitamin D levels contributes to fatigue and are associated with obesity, breast, and colon cancer. She agrees to continue to take  prescription Vit D @50 ,000 IU every week, we refill for 1 month and will follow up for routine testing of vitamin D, at least 2-3 times per year. She was informed of the risk of over-replacement of vitamin D and agrees to not increase her dose unless he discusses this with [place] first. [name] agrees to follow up with our clinic in 2 weeks.  Obesity [name] is currently in the action stage of change. As such, her goal is to continue with weight loss efforts She has agreed to follow the Category 2 plan [name] has been instructed to work up to a goal of 150 minutes of combined cardio and strengthening exercise per week for weight loss and overall health benefits. We discussed the following Behavioral Modification Strategies today: increasing lean protein intake and work on meal planning and easy cooking plans  [name] has agreed to follow up with our clinic in 2 weeks. She was informed of the importance of frequent follow up visits to maximize her success with intensive lifestyle modifications for her multiple health conditions.  I, [name], am acting as transcriptionist for Shelby Duverney, PA-C  I have reviewed the above documentation for accuracy and completeness, and I agree with the above. -Shelby Duverney, PA-C  I have reviewed the above note and agree with the plan. -Shelby Nip, MD   OBESITY BEHAVIORAL INTERVENTION VISIT  Today's visit was # 3 out of 22.  Starting weight: 221 lbs Starting date: 06/29/17 Today's weight : 217 lbs  Today's date: 07/27/2017 Total lbs lost to date: 4 (Patients must lose 7 lbs in the first 6 months to continue with counseling)   ASK: We discussed the diagnosis of obesity with [name] today and [name] agreed to give [place] permission to discuss obesity behavioral modification therapy today.  ASSESS: [name] has the diagnosis of obesity and her BMI today is 38.45 [name] is in the action stage of change   ADVISE: [name] was  educated on the multiple health risks of obesity as well as the benefit of weight loss to improve her health. She was advised of the need for long term treatment and the importance of lifestyle modifications.  AGREE: Multiple dietary modification options and treatment options were discussed and  [name] agreed to follow the Category 2 plan We discussed the following Behavioral Modification Strategies today: increasing lean protein intake and work on meal planning and easy cooking plans

## 2017-08-02 ENCOUNTER — Encounter (INDEPENDENT_AMBULATORY_CARE_PROVIDER_SITE_OTHER): Payer: Self-pay

## 2017-08-02 ENCOUNTER — Telehealth (INDEPENDENT_AMBULATORY_CARE_PROVIDER_SITE_OTHER): Payer: Self-pay | Admitting: Family Medicine

## 2017-08-02 NOTE — Telephone Encounter (Signed)
Spoke with [place] after obtaining a new auth ([id number]) DOS 9/5-10/4. Also sent the patient a mychart message indicating the importance of keeping her new appt of 9/11 (after the initial appt was rescheduled) as that date falls in the auth time frame. [name], [name]

## 2017-08-02 NOTE — Telephone Encounter (Signed)
[name] with dr Einar Gip office regarding referral with Josem Kaufmann, the Josem Kaufmann has expired need new one Office number [phone]

## 2017-08-03 ENCOUNTER — Ambulatory Visit (INDEPENDENT_AMBULATORY_CARE_PROVIDER_SITE_OTHER): Payer: BC Managed Care – PPO | Admitting: Internal Medicine

## 2017-08-03 ENCOUNTER — Encounter: Payer: Self-pay | Admitting: Internal Medicine

## 2017-08-03 ENCOUNTER — Ambulatory Visit: Payer: BC Managed Care – PPO | Admitting: Internal Medicine

## 2017-08-03 VITALS — BP 158/92 | HR 72 | Ht 63.0 in | Wt 217.8 lb

## 2017-08-03 DIAGNOSIS — E669 Obesity, unspecified: Secondary | ICD-10-CM | POA: Diagnosis not present

## 2017-08-03 DIAGNOSIS — I10 Essential (primary) hypertension: Secondary | ICD-10-CM

## 2017-08-03 DIAGNOSIS — E785 Hyperlipidemia, unspecified: Secondary | ICD-10-CM

## 2017-08-03 NOTE — Patient Instructions (Signed)
Dr Debara Pickett recommends that you continue on your current medications as directed. Please refer to the Current Medication list given to you today.  Your physician recommends that you return for lab work in 6 months - FASTING.  Dr Debara Pickett recommends that you schedule a follow-up appointment in 6 months. You will receive a reminder letter in the mail two months in advance. If you don't receive a letter, please call our office to schedule the follow-up appointment.  If you need a refill on your cardiac medications before your next appointment, please call your pharmacy.

## 2017-08-03 NOTE — Progress Notes (Signed)
OFFICE NOTE  Chief Complaint:  Headache  Primary Care Physician: Darreld Mclean, MD  HPI:  [name] is a pleasant 66 year old female with a strong family history of heart disease. Her mother died of a sudden heart attack at age 32, her both grandparents had heart disease and a grandmother had a stroke at age 95. She was seen remotely by cardiologist more than 3 years ago and had stress testing at that time which was negative. Back in November she was complaining of chest pain which was intermittent. It was somewhat worse at that time however she had an active bronchitis and was treated. Her symptoms improved somewhat however she still had some chest pain since that time. Some of it is associated with exertion and relieved by rest and other times it comes fairly randomly. It does feel like a squeezing sensation in the upper chest it does not radiate to the neck, arm or back. She describes the discomfort as a 5 or 6/10 in severity.  She occasionally takes nitroglycerin for this however her old prescription was only recently renewed.  She is desiring to start an exercise program and would like cardiovascular evaluation and risk stratification prior to that.  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  [name] had a recent nuclear stress test which was negative.  This demonstrated no reversible ischemia. She also had a lipid NMR performed, which demonstrated elevated LDL cholesterol of 117, with elevated LDL particle numbers to 1347.  Her contents a metabolic profile was normal.  Her blood pressure today is much improved. She denies any further chest pain and only occasionally has migraine headaches. We have not assessed her cholesterol since her last lipid profile and she's not on treatment for  her elevated cholesterol.  I saw [name] back in the office today. Her main complaint is some discomfort over the right trapezius area. She says this is usually worse with stress and tension. She denies any chest pain per se today. Recently her headaches have improved somewhat. Her cholesterol has been fairly well-controlled. Her blood pressure is at goal today. Unfortunate she's not managed to lose much weight and remains close to morbid obesity with BMI of 38.  08/02/2016  Returns today for follow-up. Over the past year she's had no complaints. Recently she was found to have a breast mass and is undergoing a biopsy later this month. She's not had a recent cholesterol check in the past year but had a fairly good cholesterol profile last saw her. She is continue to work on weight loss. She's on medication for depression which she says is working well. Her only issue right now is memory loss.  08/03/2017  [name] was seen today in follow-up. She has a migraine today. Overall though her migraines have improved some from her history of traumatic brain injury. She's taking less medication for that. She is more committed to primary prevention. Her lipid profile has improved some with weight loss recently she[facility] Dr. Leafy Ro. She is optimistic about further weight loss which should be helpful for her risk reduction. Blood pressure is elevated today however possibly attributable to her migraine. Her last lipid profile showed an LDL of 119. She's not on a statin and is  a borderline diabetic on metformin. She has a significant family history of early onset coronary disease. She denies chest pain or shortness of breath with exertion.  PMHx:  Past Medical History:  Diagnosis Date  . Allergy   . Anxiety   . Arthritis   . Back pain   . Constipation   . Depression   . Dyslipidemia   . GERD (gastroesophageal reflux disease)   . Headaches due to  old head injury   . History of stomach ulcers   . Hyperlipidemia   . Hypertension   . Lactose intolerance   . Migraines   . Obesity   . Prediabetes   . Reflux   . Thyroid disease     Past Surgical History:  Procedure Laterality Date  . ABDOMINAL HYSTERECTOMY    . BREAST LUMPECTOMY WITH RADIOACTIVE SEED LOCALIZATION Left 08/23/2016   Procedure: LEFT BREAST LUMPECTOMY WITH RADIOACTIVE SEED LOCALIZATION;  Surgeon: Rolm Bookbinder, MD;  Location: [facility];  Service: General;  Laterality: Left;  . BREAST SURGERY  2003   reduction  . CESAREAN SECTION    . FACET JOINT INJECTION  2011 and 2012  . MYOMECTOMY    . NM MYOCAR PERF WALL MOTION  03/2009   dipyridamole; normal pattern of perfusion in all regions, post-stress EF 65%, normal study   . THYROIDECTOMY  1989  . TRANSTHORACIC ECHOCARDIOGRAM  03/2009   EF=>55%, borderline conc LVH; mild mitral annular calcif, trace MR; trace TR; AV mildly sclerotic, mild AV regurg; mild pulm valve regurg   . TUBAL LIGATION      FAMHx:  Family History  Problem Relation Age of Onset  . Heart disease Mother        MI, HTN  . Hypertension Mother   . Thyroid disease Mother   . Hyperlipidemia Mother   . Sudden death Mother   . Anxiety disorder Mother   . Heart disease Maternal Grandmother        MI, CVA  . Heart disease Maternal Grandfather        MI  . Stroke Maternal Grandfather   . Heart disease Paternal Grandmother   . Stroke Paternal Grandmother   . Heart disease Paternal Grandfather        MI  . Glaucoma Father   . Diabetes Father   . Depression Daughter   . ADD / ADHD Son   . Thyroid disease Sister   . Heart attack Paternal Uncle        HTN  . Hypertension Paternal Uncle     SOCHx:   reports that she has never smoked. She has never used smokeless tobacco. She reports that she drinks about 0.5 - 1.0 oz of alcohol per week . She reports that she does not use drugs.  ALLERGIES:  Allergies  Allergen Reactions  .  Metoprolol Hives  . Onabotulinumtoxina (Cosmetic) Swelling  . Botox [Botulinum Toxin Type A]   . Hydrochlorothiazide Other (See Comments)    Increases risk of gout flares  . Toprol Xl [Metoprolol Succinate] Hives    ROS: Pertinent items noted in HPI and remainder of comprehensive ROS otherwise negative.  HOME MEDS: Current Outpatient Prescriptions  Medication Sig Dispense Refill  . acetaminophen-codeine (TYLENOL #3) 300-30 MG tablet Take 1-2 tablets by mouth every 8 (eight) hours as needed for moderate pain. 30 tablet 0  . ALPRAZolam (XANAX) 0.25 MG tablet Take 0.25 mg by mouth 2 (two) times daily as needed for sleep.    Marland Kitchen amLODipine (  NORVASC) 10 MG tablet TAKE 1 TABLET BY MOUTH DAILY 30 tablet 11  . aspirin 81 MG tablet Take 81 mg by mouth daily.    . carvedilol (COREG) 3.125 MG tablet Take 1 tablet (3.125 mg total) by mouth 2 (two) times daily. 60 tablet 2  . desvenlafaxine (PRISTIQ) 100 MG 24 hr tablet Take 100 mg by mouth daily.    . diclofenac (FLECTOR) 1.3 % PTCH Place 1 patch onto the skin 2 (two) times daily.    . Diclofenac Potassium (CAMBIA) 50 MG PACK Take by mouth.    . diclofenac sodium (VOLTAREN) 1 % GEL Apply 2 g topically 4 (four) times daily. 100 g 3  . Eszopiclone (ESZOPICLONE) 3 MG TABS Take 3 mg by mouth at bedtime. Take immediately before bedtime    . fluconazole (DIFLUCAN) 150 MG tablet Take 150 mg by mouth daily.    . frovatriptan (FROVA) 2.5 MG tablet Take 2.5 mg by mouth as needed for migraine. If recurs, may repeat after 2 hours. Max of 3 tabs in 24 hours.    . gabapentin (NEURONTIN) 300 MG capsule Take 300 mg by mouth 3 (three) times daily.    Marland Kitchen ipratropium (ATROVENT) 0.03 % nasal spray Place 2 sprays into the nose 4 (four) times daily. 30 mL 1  . metFORMIN (GLUCOPHAGE) 500 MG tablet Take 1 tablet (500 mg total) by mouth daily. 30 tablet 0  . nitroGLYCERIN (NITROSTAT) 0.4 MG SL tablet Place 1 tablet (0.4 mg total) under the tongue every 5 (five) minutes as  needed. 30 tablet 0  . Olopatadine HCl (PATADAY) 0.2 % SOLN Apply 1 drop to eye daily as needed (for itching). 2.5 mL 3  . polyethylene glycol (MIRALAX / GLYCOLAX) packet Take 17 g by mouth daily.    . valACYclovir (VALTREX) 500 MG tablet Take 1 tablet (500 mg total) by mouth 2 (two) times daily. Use for 3 days as needed for outbreak 30 tablet 2  . Vitamin D, Ergocalciferol, (DRISDOL) 50000 units CAPS capsule Take 1 capsule (50,000 Units total) by mouth every 7 (seven) days. 4 capsule 0   No current facility-administered medications for this visit.     LABS/IMAGING: No results found for this or any previous visit (from the past 48 hour(s)). No results found.  VITALS: BP (!) 158/92   Pulse 72   Ht 5\' 3"  (1.6 m)   Wt 217 lb 12.8 oz (98.8 kg)   SpO2 99%   BMI 38.58 kg/m   EXAM: General appearance: alert, no distress and moderately obese Neck: no carotid bruit and no JVD Lungs: clear to auscultation bilaterally Heart: regular rate and rhythm, S1, S2 normal, no murmur, click, rub or gallop Abdomen: soft, non-tender; bowel sounds normal; no masses,  no organomegaly Extremities: extremities normal, atraumatic, no cyanosis or edema Pulses: 2+ and symmetric Skin: Skin color, texture, turgor normal. No rashes or lesions Neurologic: Grossly normal  EKG: Deferred  ASSESSMENT: 1. Dyslipidemia - last LDL 130 in 01/2013, not on statin 2. Hypertension - controlled 3. Obesity - [facility]  4. History of TBI with headaches and memory loss 5. Strong family history of premature coronary artery disease 6. Mild type 2 diabetes  PLAN: 1.   Mrs. Spiller seems to be doing well although continues to have some headaches. Blood pressure was elevated today but she has a migraine. Her last LDL is improved and she is now working on the weight loss clinic. Despite this, I'm not  certain she'll reach a goal LDL less than 70 if we're considering her diabetic. She is  actually borderline. A more realistic goal would be an LDL less than 100, but she does seem amenable to a statin. Will repeat a lipid profile 6 months and see her at that time.  Pixie Casino, MD, [facility] Attending Cardiologist [place] 08/03/2017, 8:55 AM

## 2017-08-10 ENCOUNTER — Ambulatory Visit (INDEPENDENT_AMBULATORY_CARE_PROVIDER_SITE_OTHER): Payer: BC Managed Care – PPO | Admitting: Physician Assistant

## 2017-08-15 ENCOUNTER — Ambulatory Visit (INDEPENDENT_AMBULATORY_CARE_PROVIDER_SITE_OTHER): Payer: BC Managed Care – PPO | Admitting: Physician Assistant

## 2017-08-15 VITALS — BP 141/82 | HR 76 | Temp 98.0°F | Ht 63.0 in | Wt 213.0 lb

## 2017-08-15 DIAGNOSIS — E669 Obesity, unspecified: Secondary | ICD-10-CM | POA: Diagnosis not present

## 2017-08-15 DIAGNOSIS — I519 Heart disease, unspecified: Secondary | ICD-10-CM

## 2017-08-15 DIAGNOSIS — Z6837 Body mass index (BMI) 37.0-37.9, adult: Secondary | ICD-10-CM | POA: Diagnosis not present

## 2017-08-15 DIAGNOSIS — I10 Essential (primary) hypertension: Secondary | ICD-10-CM

## 2017-08-15 DIAGNOSIS — IMO0001 Reserved for inherently not codable concepts without codable children: Secondary | ICD-10-CM

## 2017-08-15 DIAGNOSIS — I5189 Other ill-defined heart diseases: Secondary | ICD-10-CM

## 2017-08-15 NOTE — Progress Notes (Addendum)
Office: [phone]  /  Fax: [phone]   HPI:   Chief Complaint: OBESITY Robin is here to discuss her progress with her obesity treatment plan. She is on the  follow the Category 2 plan and is following her eating plan approximately 50 % of the time. She states she is exercising 0 minutes 0 times per week. Lydie continues to do well with weight loss. She has been following the plan most times and she makes smarter food choices. Jone would like more options for dinner. Her weight is 213 lb (96.6 kg) today and has had a weight loss of 4 pounds over a period of 2 to 3 weeks since her last visit. She has lost 8 lbs since starting treatment with [place].  Hypertension MAREN WIESEN is a 66 y.o. female with hypertension. Her blood pressure is elevated at 141/82. Korie has not been taking her second dose of Carvedilol. [name] denies chest pain or shortness of breath on exertion. She is working weight loss to help control her blood pressure with the goal of decreasing her risk of heart attack and stroke. Gretchens blood pressure is not currently controlled.  Grade I Diastolic Dysfunction  Echo 08/08/2017 with Grade I DD, EF 51%, Mild Aortic regurgitation, Mild Tricuspid Regurgitation, Pulmonary pressure 30 mm Hg. She denies any chest pain, dyspnea, or peripheral edema.   ALLERGIES: Allergies  Allergen Reactions  . Metoprolol Hives  . Onabotulinumtoxina (Cosmetic) Swelling  . Botox [Botulinum Toxin Type A]   . Hydrochlorothiazide Other (See Comments)    Increases risk of gout flares  . Toprol Xl [Metoprolol Succinate] Hives    MEDICATIONS: Current Outpatient Prescriptions on File Prior to Visit  Medication Sig Dispense Refill  . acetaminophen-codeine (TYLENOL #3) 300-30 MG tablet Take 1-2 tablets by mouth every 8 (eight) hours as needed for moderate pain. 30 tablet 0  . ALPRAZolam (XANAX) 0.25 MG tablet Take 0.25 mg by mouth 2 (two) times daily as  needed for sleep.    Marland Kitchen amLODipine (NORVASC) 10 MG tablet TAKE 1 TABLET BY MOUTH DAILY 30 tablet 11  . aspirin 81 MG tablet Take 81 mg by mouth daily.    . carvedilol (COREG) 3.125 MG tablet Take 1 tablet (3.125 mg total) by mouth 2 (two) times daily. 60 tablet 2  . desvenlafaxine (PRISTIQ) 100 MG 24 hr tablet Take 100 mg by mouth daily.    . diclofenac (FLECTOR) 1.3 % PTCH Place 1 patch onto the skin 2 (two) times daily.    . Diclofenac Potassium (CAMBIA) 50 MG PACK Take by mouth.    . diclofenac sodium (VOLTAREN) 1 % GEL Apply 2 g topically 4 (four) times daily. 100 g 3  . Eszopiclone (ESZOPICLONE) 3 MG TABS Take 3 mg by mouth at bedtime. Take immediately before bedtime    . fluconazole (DIFLUCAN) 150 MG tablet Take 150 mg by mouth daily.    . frovatriptan (FROVA) 2.5 MG tablet Take 2.5 mg by mouth as needed for migraine. If recurs, may repeat after 2 hours. Max of 3 tabs in 24 hours.    . gabapentin (NEURONTIN) 300 MG capsule Take 300 mg by mouth 3 (three) times daily.    Marland Kitchen ipratropium (ATROVENT) 0.03 % nasal spray Place 2 sprays into the nose 4 (four) times daily. 30 mL 1  . metFORMIN (GLUCOPHAGE) 500 MG tablet Take 1 tablet (500 mg total) by mouth daily. 30 tablet 0  . nitroGLYCERIN (NITROSTAT) 0.4 MG SL tablet Place 1 tablet (  0.4 mg total) under the tongue every 5 (five) minutes as needed. 30 tablet 0  . Olopatadine HCl (PATADAY) 0.2 % SOLN Apply 1 drop to eye daily as needed (for itching). 2.5 mL 3  . polyethylene glycol (MIRALAX / GLYCOLAX) packet Take 17 g by mouth daily.    . valACYclovir (VALTREX) 500 MG tablet Take 1 tablet (500 mg total) by mouth 2 (two) times daily. Use for 3 days as needed for outbreak 30 tablet 2  . Vitamin D, Ergocalciferol, (DRISDOL) 50000 units CAPS capsule Take 1 capsule (50,000 Units total) by mouth every 7 (seven) days. 4 capsule 0   No current facility-administered medications on file prior to visit.     PAST MEDICAL HISTORY: Past Medical History:    Diagnosis Date  . Allergy   . Anxiety   . Arthritis   . Back pain   . Constipation   . Depression   . Dyslipidemia   . GERD (gastroesophageal reflux disease)   . Headaches due to old head injury   . History of stomach ulcers   . Hyperlipidemia   . Hypertension   . Lactose intolerance   . Migraines   . Obesity   . Prediabetes   . Reflux   . Thyroid disease     PAST SURGICAL HISTORY: Past Surgical History:  Procedure Laterality Date  . ABDOMINAL HYSTERECTOMY    . BREAST LUMPECTOMY WITH RADIOACTIVE SEED LOCALIZATION Left 08/23/2016   Procedure: LEFT BREAST LUMPECTOMY WITH RADIOACTIVE SEED LOCALIZATION;  Surgeon: Rolm Bookbinder, MD;  Location: [facility];  Service: General;  Laterality: Left;  . BREAST SURGERY  2003   reduction  . CESAREAN SECTION    . FACET JOINT INJECTION  2011 and 2012  . MYOMECTOMY    . NM MYOCAR PERF WALL MOTION  03/2009   dipyridamole; normal pattern of perfusion in all regions, post-stress EF 65%, normal study   . THYROIDECTOMY  1989  . TRANSTHORACIC ECHOCARDIOGRAM  03/2009   EF=>55%, borderline conc LVH; mild mitral annular calcif, trace MR; trace TR; AV mildly sclerotic, mild AV regurg; mild pulm valve regurg   . TUBAL LIGATION      SOCIAL HISTORY: Social History  Substance Use Topics  . Smoking status: Never Smoker  . Smokeless tobacco: Never Used  . Alcohol use 0.5 - 1.0 oz/week    1 - 2 Standard drinks or equivalent per week    FAMILY HISTORY: Family History  Problem Relation Age of Onset  . Heart disease Mother        MI, HTN  . Hypertension Mother   . Thyroid disease Mother   . Hyperlipidemia Mother   . Sudden death Mother   . Anxiety disorder Mother   . Heart disease Maternal Grandmother        MI, CVA  . Heart disease Maternal Grandfather        MI  . Stroke Maternal Grandfather   . Heart disease Paternal Grandmother   . Stroke Paternal Grandmother   . Heart disease Paternal Grandfather        MI  .  Glaucoma Father   . Diabetes Father   . Depression Daughter   . ADD / ADHD Son   . Thyroid disease Sister   . Heart attack Paternal Uncle        HTN  . Hypertension Paternal Uncle     ROS: Review of Systems  Constitutional: Positive for weight loss.  Respiratory: Negative for shortness of breath (  on exertion).   Cardiovascular: Negative for chest pain.    PHYSICAL EXAM: Blood pressure (!) 141/82, pulse 76, temperature 98 F (36.7 C), temperature source Oral, height 5\' 3"  (1.6 m), weight 213 lb (96.6 kg), SpO2 99 %. Body mass index is 37.73 kg/m. Physical Exam  Constitutional: She is oriented to person, place, and time. She appears well-developed and well-nourished.  Cardiovascular: Normal rate.   Pulmonary/Chest: Effort normal.  Musculoskeletal: Normal range of motion.  Neurological: She is oriented to person, place, and time.  Skin: Skin is warm and dry.  Psychiatric: She has a normal mood and affect. Her behavior is normal.  Vitals reviewed.   RECENT LABS AND TESTS: BMET    Component Value Date/Time   NA 143 06/29/2017 1043   K 3.4 (L) 06/29/2017 1043   CL 101 06/29/2017 1043   CO2 27 06/29/2017 1043   GLUCOSE 92 06/29/2017 1043   GLUCOSE 107 (H) 04/13/2017 0858   BUN 10 06/29/2017 1043   CREATININE 0.71 06/29/2017 1043   CREATININE 0.73 09/29/2015 0935   CALCIUM 8.6 (L) 06/29/2017 1043   GFRNONAA 90 06/29/2017 1043   GFRAA 104 06/29/2017 1043   Lab Results  Component Value Date   HGBA1C 5.7 (H) 06/29/2017   HGBA1C 5.8 04/13/2017   HGBA1C 5.9 02/04/2016   HGBA1C  01/09/2009    5.8 (NOTE)   The ADA recommends the following therapeutic goal for glycemic   control related to Hgb A1C measurement:   Goal of Therapy:   < 7.0% Hgb A1C   Reference: American Diabetes Association: Clinical Practice   Recommendations 2008, Diabetes Care,  2008, 31:(Suppl 1).   Lab Results  Component Value Date   INSULIN 13.1 06/29/2017   CBC    Component Value Date/Time   WBC  3.6 06/29/2017 1043   WBC 3.8 (L) 04/13/2017 0858   RBC 4.53 06/29/2017 1043   RBC 4.72 04/13/2017 0858   HGB 13.4 06/29/2017 1043   HCT 39.9 06/29/2017 1043   PLT 265.0 04/13/2017 0858   MCV 88 06/29/2017 1043   MCH 29.6 06/29/2017 1043   MCH 29.1 08/25/2014 1002   MCHC 33.6 06/29/2017 1043   MCHC 33.5 04/13/2017 0858   RDW 14.1 06/29/2017 1043   LYMPHSABS 1.2 06/29/2017 1043   MONOABS 0.4 01/09/2009 0902   EOSABS 0.1 06/29/2017 1043   BASOSABS 0.0 06/29/2017 1043   Iron/TIBC/Ferritin/ %Sat No results found for: IRON, TIBC, FERRITIN, IRONPCTSAT Lipid Panel     Component Value Date/Time   CHOL 186 06/29/2017 1043   CHOL 206 (H) 12/19/2013 0820   TRIG 77 06/29/2017 1043   TRIG 111 12/19/2013 0820   HDL 52 06/29/2017 1043   HDL 70 12/19/2013 0820   CHOLHDL 3.0 08/08/2016 0828   VLDL 17 08/08/2016 0828   LDLCALC 119 (H) 06/29/2017 1043   LDLCALC 114 (H) 12/19/2013 0820   Hepatic Function Panel     Component Value Date/Time   PROT 6.3 06/29/2017 1043   ALBUMIN 4.0 06/29/2017 1043   AST 17 06/29/2017 1043   ALT 16 06/29/2017 1043   ALKPHOS 73 06/29/2017 1043   BILITOT 0.3 06/29/2017 1043   BILIDIR <0.1 01/09/2009 1604   IBILI NOT CALCULATED 01/09/2009 1604      Component Value Date/Time   TSH 0.503 06/29/2017 1043   TSH 0.48 04/13/2017 0858   TSH 0.70 12/29/2014 0923    ASSESSMENT AND PLAN: Diastolic dysfunction  Essential hypertension  Class 2 obesity with serious comorbidity and body mass  index (BMI) of 37.0 to 37.9 in adult, unspecified obesity type  PLAN:  Hypertension We discussed sodium restriction, working on healthy weight loss, and a regular exercise program as the means to achieve improved blood pressure control. Genoveva agreed with this plan and agreed to follow up as directed. We will continue to monitor her blood pressure as well as her progress with the above lifestyle modifications. She is to take her medications as prescribed and avoid  skipping medications. She will watch for signs of hypotension as she continues her lifestyle modifications.   Grade I diastolic Dysfunction Patient will be referred to Cardiology for management of G1DD and Pulmonary HTN.  We spent > than 50% of the 15 minute visit on the counseling as documented in the note.  Obesity Analiz is currently in the action stage of change. As such, her goal is to continue with weight loss efforts She has agreed to keep a food journal with 500 calories and 40+ grams of protein at supper daily and follow the Category 2 plan Aalayah has been instructed to work up to a goal of 150 minutes of combined cardio and strengthening exercise per week for weight loss and overall health benefits. We discussed the following Behavioral Modification Strategies today: increasing lean protein intake and work on meal planning and easy cooking plans  Nevaeh has agreed to follow up with our clinic in 2 weeks. She was informed of the importance of frequent follow up visits to maximize her success with intensive lifestyle modifications for her multiple health conditions.  I, Doreene Nest, am acting as transcriptionist for Lacy Duverney, PA-C  I have reviewed the above documentation for accuracy and completeness, and I agree with the above. -Lacy Duverney, PA-C  I have reviewed the above note and agree with the plan. -Dennard Nip, MD  OBESITY BEHAVIORAL INTERVENTION VISIT  Today's visit was # 4 out of 22.  Starting weight: 221 lbs Starting date: 06/29/17 Today's weight : 213 lbs Today's date: 08/17/2017 Total lbs lost to date: 8 (Patients must lose 7 lbs in the first 6 months to continue with counseling)   ASK: We discussed the diagnosis of obesity with [name] today and Macey agreed to give [place] permission to discuss obesity behavioral modification therapy today.  ASSESS: Hibba has the diagnosis of obesity and her BMI today is 37.74 Ala is in the  action stage of change   ADVISE: Nasira was educated on the multiple health risks of obesity as well as the benefit of weight loss to improve her health. She was advised of the need for long term treatment and the importance of lifestyle modifications.  AGREE: Multiple dietary modification options and treatment options were discussed and  Shawnelle agreed to keep a food journal with 500 calories and 40+ grams of protein and follow the Category 2 plan We discussed the following Behavioral Modification Strategies today: increasing lean protein intake and work on meal planning and easy cooking plans

## 2017-08-24 ENCOUNTER — Telehealth: Payer: Self-pay | Admitting: *Deleted

## 2017-08-24 NOTE — Telephone Encounter (Signed)
Received Physician Orders from [facility]; forwarded to provider/SLS 09/27

## 2017-08-29 ENCOUNTER — Ambulatory Visit (INDEPENDENT_AMBULATORY_CARE_PROVIDER_SITE_OTHER): Payer: BC Managed Care – PPO | Admitting: Physician Assistant

## 2017-08-29 VITALS — BP 130/77 | HR 78 | Temp 98.0°F | Ht 63.0 in | Wt 214.0 lb

## 2017-08-29 DIAGNOSIS — E559 Vitamin D deficiency, unspecified: Secondary | ICD-10-CM

## 2017-08-29 DIAGNOSIS — Z6838 Body mass index (BMI) 38.0-38.9, adult: Secondary | ICD-10-CM | POA: Diagnosis not present

## 2017-08-29 DIAGNOSIS — I5189 Other ill-defined heart diseases: Secondary | ICD-10-CM

## 2017-08-29 DIAGNOSIS — R7303 Prediabetes: Secondary | ICD-10-CM | POA: Diagnosis not present

## 2017-08-29 DIAGNOSIS — I519 Heart disease, unspecified: Secondary | ICD-10-CM | POA: Diagnosis not present

## 2017-08-29 MED ORDER — METFORMIN HCL 500 MG PO TABS: 500.0000 mg | ORAL_TABLET | Freq: Every day | ORAL | 0 refills | Status: DC

## 2017-08-29 MED ORDER — VITAMIN D (ERGOCALCIFEROL) 1.25 MG (50000 UNIT) PO CAPS: 50000.0000 [IU] | ORAL_CAPSULE | ORAL | 0 refills | Status: DC

## 2017-08-29 NOTE — Progress Notes (Signed)
Office: [phone]  /  Fax: [phone]   HPI:   Chief Complaint: OBESITY [name] is here to discuss her progress with her obesity treatment plan. She is on the Category 2 plan and is following her eating plan approximately 40 % of the time. She states she is exercising 0 minutes 0 times per week. [name] has been taking care of her sick husband and has a harder time planning her meals ahead of time. She is motivated to continue her weight loss efforts and get back on track. Her weight is 214 lb (97.1 kg) today and has had a weight gain of 1 lb over a period of 2 weeks since her last visit. She has lost 7 lbs since starting treatment with [place].  Vitamin D deficiency [name] has a diagnosis of vitamin D deficiency. She is currently taking vit D and denies nausea, vomiting or muscle weakness.  Pre-Diabetes [name] has a diagnosis of pre-diabetes based on her elevated Hgb A1c and was informed this puts her at greater risk of developing diabetes. She is taking metformin currently and continues to work on diet and exercise to decrease risk of diabetes. She denies nausea, polyphagia or hypoglycemia.  Grade I Diastolic Dysfunction [name] has a diagnosis of Grade I diastolic dysfunction.   ALLERGIES: Allergies  Allergen Reactions  . Metoprolol Hives  . Onabotulinumtoxina (Cosmetic) Swelling  . Botox [Botulinum Toxin Type A]   . Hydrochlorothiazide Other (See Comments)    Increases risk of gout flares  . Toprol Xl [Metoprolol Succinate] Hives    MEDICATIONS: Current Outpatient Prescriptions on File Prior to Visit  Medication Sig Dispense Refill  . acetaminophen-codeine (TYLENOL #3) 300-30 MG tablet Take 1-2 tablets by mouth every 8 (eight) hours as needed for moderate pain. 30 tablet 0  . ALPRAZolam (XANAX) 0.25 MG tablet Take 0.25 mg by mouth 2 (two) times daily as needed for sleep.    Marland Kitchen amLODipine (NORVASC) 10 MG tablet TAKE 1 TABLET BY MOUTH DAILY 30 tablet 11  . aspirin 81  MG tablet Take 81 mg by mouth daily.    . carvedilol (COREG) 3.125 MG tablet Take 1 tablet (3.125 mg total) by mouth 2 (two) times daily. 60 tablet 2  . desvenlafaxine (PRISTIQ) 100 MG 24 hr tablet Take 100 mg by mouth daily.    . diclofenac (FLECTOR) 1.3 % PTCH Place 1 patch onto the skin 2 (two) times daily.    . Diclofenac Potassium (CAMBIA) 50 MG PACK Take by mouth.    . diclofenac sodium (VOLTAREN) 1 % GEL Apply 2 g topically 4 (four) times daily. 100 g 3  . Eszopiclone (ESZOPICLONE) 3 MG TABS Take 3 mg by mouth at bedtime. Take immediately before bedtime    . fluconazole (DIFLUCAN) 150 MG tablet Take 150 mg by mouth daily.    . frovatriptan (FROVA) 2.5 MG tablet Take 2.5 mg by mouth as needed for migraine. If recurs, may repeat after 2 hours. Max of 3 tabs in 24 hours.    . gabapentin (NEURONTIN) 300 MG capsule Take 300 mg by mouth 3 (three) times daily.    Marland Kitchen ipratropium (ATROVENT) 0.03 % nasal spray Place 2 sprays into the nose 4 (four) times daily. 30 mL 1  . metFORMIN (GLUCOPHAGE) 500 MG tablet Take 1 tablet (500 mg total) by mouth daily. 30 tablet 0  . nitroGLYCERIN (NITROSTAT) 0.4 MG SL tablet Place 1 tablet (0.4 mg total) under the tongue every 5 (five) minutes as needed. 30 tablet 0  .  Olopatadine HCl (PATADAY) 0.2 % SOLN Apply 1 drop to eye daily as needed (for itching). 2.5 mL 3  . polyethylene glycol (MIRALAX / GLYCOLAX) packet Take 17 g by mouth daily.    . valACYclovir (VALTREX) 500 MG tablet Take 1 tablet (500 mg total) by mouth 2 (two) times daily. Use for 3 days as needed for outbreak 30 tablet 2  . Vitamin D, Ergocalciferol, (DRISDOL) 50000 units CAPS capsule Take 1 capsule (50,000 Units total) by mouth every 7 (seven) days. 4 capsule 0   No current facility-administered medications on file prior to visit.     PAST MEDICAL HISTORY: Past Medical History:  Diagnosis Date  . Allergy   . Anxiety   . Arthritis   . Back pain   . Constipation   . Depression   .  Dyslipidemia   . GERD (gastroesophageal reflux disease)   . Headaches due to old head injury   . History of stomach ulcers   . Hyperlipidemia   . Hypertension   . Lactose intolerance   . Migraines   . Obesity   . Prediabetes   . Reflux   . Thyroid disease     PAST SURGICAL HISTORY: Past Surgical History:  Procedure Laterality Date  . ABDOMINAL HYSTERECTOMY    . BREAST LUMPECTOMY WITH RADIOACTIVE SEED LOCALIZATION Left 08/23/2016   Procedure: LEFT BREAST LUMPECTOMY WITH RADIOACTIVE SEED LOCALIZATION;  Surgeon: Rolm Bookbinder, MD;  Location: [place];  Service: General;  Laterality: Left;  . BREAST SURGERY  2003   reduction  . CESAREAN SECTION    . FACET JOINT INJECTION  2011 and 2012  . MYOMECTOMY    . NM MYOCAR PERF WALL MOTION  03/2009   dipyridamole; normal pattern of perfusion in all regions, post-stress EF 65%, normal study   . THYROIDECTOMY  1989  . TRANSTHORACIC ECHOCARDIOGRAM  03/2009   EF=>55%, borderline conc LVH; mild mitral annular calcif, trace MR; trace TR; AV mildly sclerotic, mild AV regurg; mild pulm valve regurg   . TUBAL LIGATION      SOCIAL HISTORY: Social History  Substance Use Topics  . Smoking status: Never Smoker  . Smokeless tobacco: Never Used  . Alcohol use 0.5 - 1.0 oz/week    1 - 2 Standard drinks or equivalent per week    FAMILY HISTORY: Family History  Problem Relation Age of Onset  . Heart disease Mother        MI, HTN  . Hypertension Mother   . Thyroid disease Mother   . Hyperlipidemia Mother   . Sudden death Mother   . Anxiety disorder Mother   . Heart disease Maternal Grandmother        MI, CVA  . Heart disease Maternal Grandfather        MI  . Stroke Maternal Grandfather   . Heart disease Paternal Grandmother   . Stroke Paternal Grandmother   . Heart disease Paternal Grandfather        MI  . Glaucoma Father   . Diabetes Father   . Depression Daughter   . ADD / ADHD Son   . Thyroid disease Sister   .  Heart attack Paternal Uncle        HTN  . Hypertension Paternal Uncle     ROS: Review of Systems  Constitutional: Negative for weight loss.  Gastrointestinal: Negative for nausea and vomiting.  Musculoskeletal:       Negative muscle weakness  Endo/Heme/Allergies:  Negative polyphagia Negative hypoglycemia    PHYSICAL EXAM: Blood pressure 130/77, pulse 78, temperature 98 F (36.7 C), temperature source Oral, height 5\' 3"  (1.6 m), weight 214 lb (97.1 kg), SpO2 98 %. Body mass index is 37.91 kg/m. Physical Exam  Constitutional: She is oriented to person, place, and time. She appears well-developed and well-nourished.  Pulmonary/Chest: Effort normal.  Musculoskeletal: Normal range of motion.  Neurological: She is oriented to person, place, and time.  Skin: Skin is warm and dry.  Psychiatric: She has a normal mood and affect. Her behavior is normal.  Vitals reviewed.   RECENT LABS AND TESTS: BMET    Component Value Date/Time   NA 143 06/29/2017 1043   K 3.4 (L) 06/29/2017 1043   CL 101 06/29/2017 1043   CO2 27 06/29/2017 1043   GLUCOSE 92 06/29/2017 1043   GLUCOSE 107 (H) 04/13/2017 0858   BUN 10 06/29/2017 1043   CREATININE 0.71 06/29/2017 1043   CREATININE 0.73 09/29/2015 0935   CALCIUM 8.6 (L) 06/29/2017 1043   GFRNONAA 90 06/29/2017 1043   GFRAA 104 06/29/2017 1043   Lab Results  Component Value Date   HGBA1C 5.7 (H) 06/29/2017   HGBA1C 5.8 04/13/2017   HGBA1C 5.9 02/04/2016   HGBA1C  01/09/2009    5.8 (NOTE)   The ADA recommends the following therapeutic goal for glycemic   control related to Hgb A1C measurement:   Goal of Therapy:   < 7.0% Hgb A1C   Reference: American Diabetes Association: Clinical Practice   Recommendations 2008, Diabetes Care,  2008, 31:(Suppl 1).   Lab Results  Component Value Date   INSULIN 13.1 06/29/2017   CBC    Component Value Date/Time   WBC 3.6 06/29/2017 1043   WBC 3.8 (L) 04/13/2017 0858   RBC 4.53 06/29/2017 1043     RBC 4.72 04/13/2017 0858   HGB 13.4 06/29/2017 1043   HCT 39.9 06/29/2017 1043   PLT 265.0 04/13/2017 0858   MCV 88 06/29/2017 1043   MCH 29.6 06/29/2017 1043   MCH 29.1 08/25/2014 1002   MCHC 33.6 06/29/2017 1043   MCHC 33.5 04/13/2017 0858   RDW 14.1 06/29/2017 1043   LYMPHSABS 1.2 06/29/2017 1043   MONOABS 0.4 01/09/2009 0902   EOSABS 0.1 06/29/2017 1043   BASOSABS 0.0 06/29/2017 1043   Iron/TIBC/Ferritin/ %Sat No results found for: IRON, TIBC, FERRITIN, IRONPCTSAT Lipid Panel     Component Value Date/Time   CHOL 186 06/29/2017 1043   CHOL 206 (H) 12/19/2013 0820   TRIG 77 06/29/2017 1043   TRIG 111 12/19/2013 0820   HDL 52 06/29/2017 1043   HDL 70 12/19/2013 0820   CHOLHDL 3.0 08/08/2016 0828   VLDL 17 08/08/2016 0828   LDLCALC 119 (H) 06/29/2017 1043   LDLCALC 114 (H) 12/19/2013 0820   Hepatic Function Panel     Component Value Date/Time   PROT 6.3 06/29/2017 1043   ALBUMIN 4.0 06/29/2017 1043   AST 17 06/29/2017 1043   ALT 16 06/29/2017 1043   ALKPHOS 73 06/29/2017 1043   BILITOT 0.3 06/29/2017 1043   BILIDIR <0.1 01/09/2009 1604   IBILI NOT CALCULATED 01/09/2009 1604      Component Value Date/Time   TSH 0.503 06/29/2017 1043   TSH 0.48 04/13/2017 0858   TSH 0.70 12/29/2014 0923    ASSESSMENT AND PLAN: Vitamin D deficiency - Plan: Vitamin D, Ergocalciferol, (DRISDOL) 50000 units CAPS capsule  Prediabetes - Plan: metFORMIN (GLUCOPHAGE) 500 MG tablet  Grade I  diastolic dysfunction  Class 2 severe obesity with serious comorbidity and body mass index (BMI) of 38.0 to 38.9 in adult, unspecified obesity type ([place])  PLAN:  Vitamin D Deficiency [name] was informed that low vitamin D levels contributes to fatigue and are associated with obesity, breast, and colon cancer. She agrees to continue to take prescription Vit D @50 ,000 IU every week, we will refill for 1 month and will follow up for routine testing of vitamin D, at least 2-3 times per year. She  was informed of the risk of over-replacement of vitamin D and agrees to not increase her dose unless he discusses this with [place] first. [name] agrees to follow up with our clinic in 2 to 3 weeks.  Pre-Diabetes [name] will continue to work on weight loss, exercise, and decreasing simple carbohydrates in her diet to help decrease the risk of diabetes. We dicussed metformin including benefits and risks. She was informed that eating too many simple carbohydrates or too many calories at one sitting increases the likelihood of GI side effects. [name] agrees to continue metformin for now and a prescription was written today for 1 month refill. [name] agreed to follow up with [place] as directed to monitor her progress.  Grade I Diastolic Dysfunction [name] was advised to call her cardiologist.  Obesity [name] is currently in the action stage of change. As such, her goal is to continue with weight loss efforts She has agreed to keep a food journal with 500 calories and 40 grams of protein at supper daily and follow the Category 2 plan [name] has been instructed to work up to a goal of 150 minutes of combined cardio and strengthening exercise per week for weight loss and overall health benefits. We discussed the following Behavioral Modification Strategies today: increasing lean protein intake and keeping healthy foods in the home  [name] has agreed to follow up with our clinic in 2 to 3 weeks. She was informed of the importance of frequent follow up visits to maximize her success with intensive lifestyle modifications for her multiple health conditions.  I, Doreene Nest, am acting as transcriptionist for Shelby Duverney, PA-C  I have reviewed the above documentation for accuracy and completeness, and I agree with the above. -Shelby Duverney, PA-C  I have reviewed the above note and agree with the plan. -Dennard Nip, MD   OBESITY BEHAVIORAL INTERVENTION VISIT  Today's visit was # 5 out of  22.  Starting weight: 221 lbs Starting date: 06/29/17 Today's weight : 214 lbs Today's date: 08/29/2017 Total lbs lost to date: 7 (Patients must lose 7 lbs in the first 6 months to continue with counseling)   ASK: We discussed the diagnosis of obesity with [name] today and [name] agreed to give [place] permission to discuss obesity behavioral modification therapy today.  ASSESS: [name] has the diagnosis of obesity and her BMI today is 37.92 [name] is in the action stage of change   ADVISE: [name] was educated on the multiple health risks of obesity as well as the benefit of weight loss to improve her health. She was advised of the need for long term treatment and the importance of lifestyle modifications.  AGREE: Multiple dietary modification options and treatment options were discussed and  [name] agreed to keep a food journal with 500 calories and 40 grams of protein at supper daily and follow the Category 2 plan We discussed the following Behavioral Modification Strategies today: increasing lean protein intake and keeping healthy foods in the  home

## 2017-09-13 ENCOUNTER — Ambulatory Visit (INDEPENDENT_AMBULATORY_CARE_PROVIDER_SITE_OTHER): Payer: BC Managed Care – PPO | Admitting: Physician Assistant

## 2017-09-13 VITALS — BP 135/84 | HR 66 | Temp 97.7°F | Ht 63.0 in | Wt 210.0 lb

## 2017-09-13 DIAGNOSIS — Z6837 Body mass index (BMI) 37.0-37.9, adult: Secondary | ICD-10-CM | POA: Diagnosis not present

## 2017-09-13 DIAGNOSIS — I5189 Other ill-defined heart diseases: Secondary | ICD-10-CM

## 2017-09-13 DIAGNOSIS — I519 Heart disease, unspecified: Secondary | ICD-10-CM | POA: Diagnosis not present

## 2017-09-13 NOTE — Progress Notes (Signed)
Office: [phone]  /  Fax: [phone]   HPI:   Chief Complaint: OBESITY [name] is here to discuss her progress with her obesity treatment plan. She is on the Category 2 plan and is following her eating plan approximately 40 % of the time. She states she is exercising 0 minutes 0 times per week. [name] continues to do well with weight loss. She plans her meals well and states hunger is well controlled. She would like more variety at dinner. Her weight is 210 lb (95.3 kg) today and has had a weight loss of 4 pounds over a period of 2 weeks since her last visit. She has lost 11 lbs since starting treatment with [place].  Grade I diastolic dysfunction [name] has a diagnosis of grade I diastolic dysfunction and she has no chest pain or dyspnea. [name] denies any peripheral edema.   ALLERGIES: Allergies  Allergen Reactions  . Metoprolol Hives  . Onabotulinumtoxina (Cosmetic) Swelling  . Botox [Botulinum Toxin Type A]   . Hydrochlorothiazide Other (See Comments)    Increases risk of gout flares  . Toprol Xl [Metoprolol Succinate] Hives    MEDICATIONS: Current Outpatient Prescriptions on File Prior to Visit  Medication Sig Dispense Refill  . acetaminophen-codeine (TYLENOL #3) 300-30 MG tablet Take 1-2 tablets by mouth every 8 (eight) hours as needed for moderate pain. 30 tablet 0  . ALPRAZolam (XANAX) 0.25 MG tablet Take 0.25 mg by mouth 2 (two) times daily as needed for sleep.    Marland Kitchen amLODipine (NORVASC) 10 MG tablet TAKE 1 TABLET BY MOUTH DAILY 30 tablet 11  . aspirin 81 MG tablet Take 81 mg by mouth daily.    . carvedilol (COREG) 3.125 MG tablet Take 1 tablet (3.125 mg total) by mouth 2 (two) times daily. 60 tablet 2  . desvenlafaxine (PRISTIQ) 100 MG 24 hr tablet Take 100 mg by mouth daily.    . diclofenac (FLECTOR) 1.3 % PTCH Place 1 patch onto the skin 2 (two) times daily.    . Diclofenac Potassium (CAMBIA) 50 MG PACK Take by mouth.    . diclofenac sodium (VOLTAREN) 1 %  GEL Apply 2 g topically 4 (four) times daily. 100 g 3  . Eszopiclone (ESZOPICLONE) 3 MG TABS Take 3 mg by mouth at bedtime. Take immediately before bedtime    . fluconazole (DIFLUCAN) 150 MG tablet Take 150 mg by mouth daily.    . frovatriptan (FROVA) 2.5 MG tablet Take 2.5 mg by mouth as needed for migraine. If recurs, may repeat after 2 hours. Max of 3 tabs in 24 hours.    . gabapentin (NEURONTIN) 300 MG capsule Take 300 mg by mouth 3 (three) times daily.    Marland Kitchen ipratropium (ATROVENT) 0.03 % nasal spray Place 2 sprays into the nose 4 (four) times daily. 30 mL 1  . metFORMIN (GLUCOPHAGE) 500 MG tablet Take 1 tablet (500 mg total) by mouth daily. 30 tablet 0  . nitroGLYCERIN (NITROSTAT) 0.4 MG SL tablet Place 1 tablet (0.4 mg total) under the tongue every 5 (five) minutes as needed. 30 tablet 0  . Olopatadine HCl (PATADAY) 0.2 % SOLN Apply 1 drop to eye daily as needed (for itching). 2.5 mL 3  . polyethylene glycol (MIRALAX / GLYCOLAX) packet Take 17 g by mouth daily.    . valACYclovir (VALTREX) 500 MG tablet Take 1 tablet (500 mg total) by mouth 2 (two) times daily. Use for 3 days as needed for outbreak 30 tablet 2  . Vitamin D,  Ergocalciferol, (DRISDOL) 50000 units CAPS capsule Take 1 capsule (50,000 Units total) by mouth every 7 (seven) days. 4 capsule 0   No current facility-administered medications on file prior to visit.     PAST MEDICAL HISTORY: Past Medical History:  Diagnosis Date  . Allergy   . Anxiety   . Arthritis   . Back pain   . Constipation   . Depression   . Dyslipidemia   . GERD (gastroesophageal reflux disease)   . Headaches due to old head injury   . History of stomach ulcers   . Hyperlipidemia   . Hypertension   . Lactose intolerance   . Migraines   . Obesity   . Prediabetes   . Reflux   . Thyroid disease     PAST SURGICAL HISTORY: Past Surgical History:  Procedure Laterality Date  . ABDOMINAL HYSTERECTOMY    . BREAST LUMPECTOMY WITH RADIOACTIVE SEED  LOCALIZATION Left 08/23/2016   Procedure: LEFT BREAST LUMPECTOMY WITH RADIOACTIVE SEED LOCALIZATION;  Surgeon: Rolm Bookbinder, MD;  Location: [place];  Service: General;  Laterality: Left;  . BREAST SURGERY  2003   reduction  . CESAREAN SECTION    . FACET JOINT INJECTION  2011 and 2012  . MYOMECTOMY    . NM MYOCAR PERF WALL MOTION  03/2009   dipyridamole; normal pattern of perfusion in all regions, post-stress EF 65%, normal study   . THYROIDECTOMY  1989  . TRANSTHORACIC ECHOCARDIOGRAM  03/2009   EF=>55%, borderline conc LVH; mild mitral annular calcif, trace MR; trace TR; AV mildly sclerotic, mild AV regurg; mild pulm valve regurg   . TUBAL LIGATION      SOCIAL HISTORY: Social History  Substance Use Topics  . Smoking status: Never Smoker  . Smokeless tobacco: Never Used  . Alcohol use 0.5 - 1.0 oz/week    1 - 2 Standard drinks or equivalent per week    FAMILY HISTORY: Family History  Problem Relation Age of Onset  . Heart disease Mother        MI, HTN  . Hypertension Mother   . Thyroid disease Mother   . Hyperlipidemia Mother   . Sudden death Mother   . Anxiety disorder Mother   . Heart disease Maternal Grandmother        MI, CVA  . Heart disease Maternal Grandfather        MI  . Stroke Maternal Grandfather   . Heart disease Paternal Grandmother   . Stroke Paternal Grandmother   . Heart disease Paternal Grandfather        MI  . Glaucoma Father   . Diabetes Father   . Depression Daughter   . ADD / ADHD Son   . Thyroid disease Sister   . Heart attack Paternal Uncle        HTN  . Hypertension Paternal Uncle     ROS: Review of Systems  Constitutional: Positive for weight loss.  Respiratory: Negative for shortness of breath.   Cardiovascular: Negative for chest pain.    PHYSICAL EXAM: Blood pressure 135/84, pulse 66, temperature 97.7 F (36.5 C), temperature source Oral, height 5\' 3"  (1.6 m), weight 210 lb (95.3 kg), SpO2 97 %. Body mass  index is 37.2 kg/m. Physical Exam  Constitutional: She is oriented to person, place, and time. She appears well-developed and well-nourished.  Cardiovascular: Normal rate.   Pulmonary/Chest: Effort normal.  Musculoskeletal: Normal range of motion. She exhibits no edema (peripheral).  Neurological: She is oriented to  person, place, and time.  Skin: Skin is warm and dry.  Psychiatric: She has a normal mood and affect. Her behavior is normal.  Vitals reviewed.   RECENT LABS AND TESTS: BMET    Component Value Date/Time   NA 143 06/29/2017 1043   K 3.4 (L) 06/29/2017 1043   CL 101 06/29/2017 1043   CO2 27 06/29/2017 1043   GLUCOSE 92 06/29/2017 1043   GLUCOSE 107 (H) 04/13/2017 0858   BUN 10 06/29/2017 1043   CREATININE 0.71 06/29/2017 1043   CREATININE 0.73 09/29/2015 0935   CALCIUM 8.6 (L) 06/29/2017 1043   GFRNONAA 90 06/29/2017 1043   GFRAA 104 06/29/2017 1043   Lab Results  Component Value Date   HGBA1C 5.7 (H) 06/29/2017   HGBA1C 5.8 04/13/2017   HGBA1C 5.9 02/04/2016   HGBA1C  01/09/2009    5.8 (NOTE)   The ADA recommends the following therapeutic goal for glycemic   control related to Hgb A1C measurement:   Goal of Therapy:   < 7.0% Hgb A1C   Reference: American Diabetes Association: Clinical Practice   Recommendations 2008, Diabetes Care,  2008, 31:(Suppl 1).   Lab Results  Component Value Date   INSULIN 13.1 06/29/2017   CBC    Component Value Date/Time   WBC 3.6 06/29/2017 1043   WBC 3.8 (L) 04/13/2017 0858   RBC 4.53 06/29/2017 1043   RBC 4.72 04/13/2017 0858   HGB 13.4 06/29/2017 1043   HCT 39.9 06/29/2017 1043   PLT 265.0 04/13/2017 0858   MCV 88 06/29/2017 1043   MCH 29.6 06/29/2017 1043   MCH 29.1 08/25/2014 1002   MCHC 33.6 06/29/2017 1043   MCHC 33.5 04/13/2017 0858   RDW 14.1 06/29/2017 1043   LYMPHSABS 1.2 06/29/2017 1043   MONOABS 0.4 01/09/2009 0902   EOSABS 0.1 06/29/2017 1043   BASOSABS 0.0 06/29/2017 1043   Iron/TIBC/Ferritin/  %Sat No results found for: IRON, TIBC, FERRITIN, IRONPCTSAT Lipid Panel     Component Value Date/Time   CHOL 186 06/29/2017 1043   CHOL 206 (H) 12/19/2013 0820   TRIG 77 06/29/2017 1043   TRIG 111 12/19/2013 0820   HDL 52 06/29/2017 1043   HDL 70 12/19/2013 0820   CHOLHDL 3.0 08/08/2016 0828   VLDL 17 08/08/2016 0828   LDLCALC 119 (H) 06/29/2017 1043   LDLCALC 114 (H) 12/19/2013 0820   Hepatic Function Panel     Component Value Date/Time   PROT 6.3 06/29/2017 1043   ALBUMIN 4.0 06/29/2017 1043   AST 17 06/29/2017 1043   ALT 16 06/29/2017 1043   ALKPHOS 73 06/29/2017 1043   BILITOT 0.3 06/29/2017 1043   BILIDIR <0.1 01/09/2009 1604   IBILI NOT CALCULATED 01/09/2009 1604      Component Value Date/Time   TSH 0.503 06/29/2017 1043   TSH 0.48 04/13/2017 0858   TSH 0.70 12/29/2014 0923    ASSESSMENT AND PLAN: Grade I diastolic dysfunction  Class 2 severe obesity with serious comorbidity and body mass index (BMI) of 37.0 to 37.9 in adult, unspecified obesity type ([facility])  PLAN:  Grade I diastolic dysfunction [name] is encouraged to  is to continue to follow up with her cardiologist. She agrees to follow up with our clinic in 2 weeks.  We spent > than 50% of the 15 minute visit on the counseling as documented in the note.   Obesity [name] is currently in the action stage of change. As such, her goal is to continue with weight loss  efforts She has agreed to keep a food journal with 500 calories and 40 grams of protein at dinner daily and follow the Category 2 plan [name] has been instructed to work up to a goal of 150 minutes of combined cardio and strengthening exercise per week for weight loss and overall health benefits. We discussed the following Behavioral Modification Strategies today: keep a strict food journal  [name] has agreed to follow up with our clinic in 2 weeks. She was informed of the importance of frequent follow up visits to maximize her success  with intensive lifestyle modifications for her multiple health conditions.  I, [name], am acting as transcriptionist for Lacy Duverney, PA-C  I have reviewed the above documentation for accuracy and completeness, and I agree with the above. -Lacy Duverney, PA-C  I have reviewed the above note and agree with the plan. -Dennard Nip, MD    OBESITY BEHAVIORAL INTERVENTION VISIT  Today's visit was # 6 out of 22.  Starting weight: 221 lbs Starting date: 06/29/17 Today's weight : 210 lbs Today's date: 09/13/2017 Total lbs lost to date: 11 (Patients must lose 7 lbs in the first 6 months to continue with counseling)   ASK: We discussed the diagnosis of obesity with [name] today and Sophira agreed to give [place] permission to discuss obesity behavioral modification therapy today.  ASSESS: Daniella has the diagnosis of obesity and her BMI today is 37.21 Shamel is in the action stage of change   ADVISE: Desia was educated on the multiple health risks of obesity as well as the benefit of weight loss to improve her health. She was advised of the need for long term treatment and the importance of lifestyle modifications.  AGREE: Multiple dietary modification options and treatment options were discussed and Murry agreed to keep a food journal with 500 calories and 40 grams of protein at dinner daily and follow the Category 2 plan We discussed the following Behavioral Modification Strategies today: keep a strict food journal

## 2017-09-25 ENCOUNTER — Other Ambulatory Visit (INDEPENDENT_AMBULATORY_CARE_PROVIDER_SITE_OTHER): Payer: Self-pay | Admitting: Physician Assistant

## 2017-09-25 DIAGNOSIS — R7303 Prediabetes: Secondary | ICD-10-CM

## 2017-09-27 ENCOUNTER — Ambulatory Visit (INDEPENDENT_AMBULATORY_CARE_PROVIDER_SITE_OTHER): Payer: BC Managed Care – PPO | Admitting: Physician Assistant

## 2017-10-05 ENCOUNTER — Ambulatory Visit (INDEPENDENT_AMBULATORY_CARE_PROVIDER_SITE_OTHER): Payer: BC Managed Care – PPO | Admitting: Physician Assistant

## 2017-10-05 VITALS — BP 168/92 | HR 61 | Temp 98.1°F | Ht 63.0 in | Wt 209.0 lb

## 2017-10-05 DIAGNOSIS — I10 Essential (primary) hypertension: Secondary | ICD-10-CM | POA: Diagnosis not present

## 2017-10-05 DIAGNOSIS — Z6837 Body mass index (BMI) 37.0-37.9, adult: Secondary | ICD-10-CM | POA: Diagnosis not present

## 2017-10-05 DIAGNOSIS — E559 Vitamin D deficiency, unspecified: Secondary | ICD-10-CM | POA: Diagnosis not present

## 2017-10-05 MED ORDER — VITAMIN D (ERGOCALCIFEROL) 1.25 MG (50000 UNIT) PO CAPS: 50000.0000 [IU] | ORAL_CAPSULE | ORAL | 0 refills | Status: DC

## 2017-10-05 NOTE — Progress Notes (Signed)
Office: [phone]  /  Fax: [phone]   HPI:   Chief Complaint: OBESITY [name] is here to discuss her progress with her obesity treatment plan. She is on the Category 2 plan and is following her eating plan approximately 15 % of the time. She states she is walking for 30 minutes 3 times per week. [name] continues to do well with weight loss. She has been eating out more but she is motivated to get back on track and continue with weight loss. [name] would like more variety with her meals. Her weight is 209 lb (94.8 kg) today and has had a weight loss of 1 pound over a period of 3 weeks since her last visit. She has lost 12 lbs since starting treatment with [place].  Vitamin D deficiency [name] has a diagnosis of vitamin D deficiency. She is currently taking vit D and denies nausea, vomiting or muscle weakness.  Hypertension [name] is a 66 y.o. female with hypertension. Her blood pressure is elevated today at 168/92 and she complains of a headache and states her blood pressure is stable at home.  [name] denies chest pain or shortness of breath on exertion. She is working weight loss to help control her blood pressure with the goal of decreasing her risk of heart attack and stroke. [name] blood pressure is not currently controlled.  ALLERGIES: Allergies  Allergen Reactions  . Metoprolol Hives  . Onabotulinumtoxina (Cosmetic) Swelling  . Botox [Botulinum Toxin Type A]   . Hydrochlorothiazide Other (See Comments)    Increases risk of gout flares  . Toprol Xl [Metoprolol Succinate] Hives    MEDICATIONS: Current Outpatient Medications on File Prior to Visit  Medication Sig Dispense Refill  . acetaminophen-codeine (TYLENOL #3) 300-30 MG tablet Take 1-2 tablets by mouth every 8 (eight) hours as needed for moderate pain. 30 tablet 0  . ALPRAZolam (XANAX) 0.25 MG tablet Take 0.25 mg by mouth 2 (two) times daily as needed for sleep.    Marland Kitchen amLODipine  (NORVASC) 10 MG tablet TAKE 1 TABLET BY MOUTH DAILY 30 tablet 11  . aspirin 81 MG tablet Take 81 mg by mouth daily.    . carvedilol (COREG) 3.125 MG tablet Take 1 tablet (3.125 mg total) by mouth 2 (two) times daily. 60 tablet 2  . desvenlafaxine (PRISTIQ) 100 MG 24 hr tablet Take 100 mg by mouth daily.    . diclofenac (FLECTOR) 1.3 % PTCH Place 1 patch onto the skin 2 (two) times daily.    . Diclofenac Potassium (CAMBIA) 50 MG PACK Take by mouth.    . diclofenac sodium (VOLTAREN) 1 % GEL Apply 2 g topically 4 (four) times daily. 100 g 3  . Eszopiclone (ESZOPICLONE) 3 MG TABS Take 3 mg by mouth at bedtime. Take immediately before bedtime    . fluconazole (DIFLUCAN) 150 MG tablet Take 150 mg by mouth daily.    . frovatriptan (FROVA) 2.5 MG tablet Take 2.5 mg by mouth as needed for migraine. If recurs, may repeat after 2 hours. Max of 3 tabs in 24 hours.    . gabapentin (NEURONTIN) 300 MG capsule Take 300 mg by mouth 3 (three) times daily.    Marland Kitchen ipratropium (ATROVENT) 0.03 % nasal spray Place 2 sprays into the nose 4 (four) times daily. 30 mL 1  . metFORMIN (GLUCOPHAGE) 500 MG tablet Take 1 tablet (500 mg total) by mouth daily. 30 tablet 0  . nitroGLYCERIN (NITROSTAT) 0.4 MG SL tablet Place 1 tablet (  0.4 mg total) under the tongue every 5 (five) minutes as needed. 30 tablet 0  . Olopatadine HCl (PATADAY) 0.2 % SOLN Apply 1 drop to eye daily as needed (for itching). 2.5 mL 3  . polyethylene glycol (MIRALAX / GLYCOLAX) packet Take 17 g by mouth daily.    . valACYclovir (VALTREX) 500 MG tablet Take 1 tablet (500 mg total) by mouth 2 (two) times daily. Use for 3 days as needed for outbreak 30 tablet 2  . Vitamin D, Ergocalciferol, (DRISDOL) 50000 units CAPS capsule Take 1 capsule (50,000 Units total) by mouth every 7 (seven) days. 4 capsule 0   No current facility-administered medications on file prior to visit.     PAST MEDICAL HISTORY: Past Medical History:  Diagnosis Date  . Allergy   . Anxiety    . Arthritis   . Back pain   . Constipation   . Depression   . Dyslipidemia   . GERD (gastroesophageal reflux disease)   . Headaches due to old head injury   . History of stomach ulcers   . Hyperlipidemia   . Hypertension   . Lactose intolerance   . Migraines   . Obesity   . Prediabetes   . Reflux   . Thyroid disease     PAST SURGICAL HISTORY: Past Surgical History:  Procedure Laterality Date  . ABDOMINAL HYSTERECTOMY    . BREAST SURGERY  2003   reduction  . CESAREAN SECTION    . FACET JOINT INJECTION  2011 and 2012  . MYOMECTOMY    . NM MYOCAR PERF WALL MOTION  03/2009   dipyridamole; normal pattern of perfusion in all regions, post-stress EF 65%, normal study   . THYROIDECTOMY  1989  . TRANSTHORACIC ECHOCARDIOGRAM  03/2009   EF=>55%, borderline conc LVH; mild mitral annular calcif, trace MR; trace TR; AV mildly sclerotic, mild AV regurg; mild pulm valve regurg   . TUBAL LIGATION      SOCIAL HISTORY: Social History   Tobacco Use  . Smoking status: Never Smoker  . Smokeless tobacco: Never Used  Substance Use Topics  . Alcohol use: Yes    Alcohol/week: 0.5 - 1.0 oz    Types: 1 - 2 Standard drinks or equivalent per week  . Drug use: No    FAMILY HISTORY: Family History  Problem Relation Age of Onset  . Heart disease Mother        MI, HTN  . Hypertension Mother   . Thyroid disease Mother   . Hyperlipidemia Mother   . Sudden death Mother   . Anxiety disorder Mother   . Heart disease Maternal Grandmother        MI, CVA  . Heart disease Maternal Grandfather        MI  . Stroke Maternal Grandfather   . Heart disease Paternal Grandmother   . Stroke Paternal Grandmother   . Heart disease Paternal Grandfather        MI  . Glaucoma Father   . Diabetes Father   . Depression Daughter   . ADD / ADHD Son   . Thyroid disease Sister   . Heart attack Paternal Uncle        HTN  . Hypertension Paternal Uncle     ROS: Review of Systems  Constitutional:  Positive for weight loss.  Respiratory: Negative for shortness of breath (on exertion).   Cardiovascular: Negative for chest pain.  Gastrointestinal: Negative for nausea and vomiting.  Musculoskeletal:  Negative muscle weakness  Neurological: Positive for headaches.    PHYSICAL EXAM: Blood pressure (!) 168/92, pulse 61, temperature 98.1 F (36.7 C), temperature source Oral, height 5\' 3"  (1.6 m), weight 209 lb (94.8 kg), SpO2 98 %. Body mass index is 37.02 kg/m. Physical Exam  Constitutional: She is oriented to person, place, and time. She appears well-developed and well-nourished.  Cardiovascular: Normal rate.  Pulmonary/Chest: Effort normal.  Musculoskeletal: Normal range of motion.  Neurological: She is oriented to person, place, and time.  Skin: Skin is warm and dry.  Psychiatric: She has a normal mood and affect. Her behavior is normal.  Vitals reviewed.   RECENT LABS AND TESTS: BMET    Component Value Date/Time   NA 143 06/29/2017 1043   K 3.4 (L) 06/29/2017 1043   CL 101 06/29/2017 1043   CO2 27 06/29/2017 1043   GLUCOSE 92 06/29/2017 1043   GLUCOSE 107 (H) 04/13/2017 0858   BUN 10 06/29/2017 1043   CREATININE 0.71 06/29/2017 1043   CREATININE 0.73 09/29/2015 0935   CALCIUM 8.6 (L) 06/29/2017 1043   GFRNONAA 90 06/29/2017 1043   GFRAA 104 06/29/2017 1043   Lab Results  Component Value Date   HGBA1C 5.7 (H) 06/29/2017   HGBA1C 5.8 04/13/2017   HGBA1C 5.9 02/04/2016   HGBA1C  01/09/2009    5.8 (NOTE)   The ADA recommends the following therapeutic goal for glycemic   control related to Hgb A1C measurement:   Goal of Therapy:   < 7.0% Hgb A1C   Reference: American Diabetes Association: Clinical Practice   Recommendations 2008, Diabetes Care,  2008, 31:(Suppl 1).   Lab Results  Component Value Date   INSULIN 13.1 06/29/2017   CBC    Component Value Date/Time   WBC 3.6 06/29/2017 1043   WBC 3.8 (L) 04/13/2017 0858   RBC 4.53 06/29/2017 1043   RBC  4.72 04/13/2017 0858   HGB 13.4 06/29/2017 1043   HCT 39.9 06/29/2017 1043   PLT 265.0 04/13/2017 0858   MCV 88 06/29/2017 1043   MCH 29.6 06/29/2017 1043   MCH 29.1 08/25/2014 1002   MCHC 33.6 06/29/2017 1043   MCHC 33.5 04/13/2017 0858   RDW 14.1 06/29/2017 1043   LYMPHSABS 1.2 06/29/2017 1043   MONOABS 0.4 01/09/2009 0902   EOSABS 0.1 06/29/2017 1043   BASOSABS 0.0 06/29/2017 1043   Iron/TIBC/Ferritin/ %Sat No results found for: IRON, TIBC, FERRITIN, IRONPCTSAT Lipid Panel     Component Value Date/Time   CHOL 186 06/29/2017 1043   CHOL 206 (H) 12/19/2013 0820   TRIG 77 06/29/2017 1043   TRIG 111 12/19/2013 0820   HDL 52 06/29/2017 1043   HDL 70 12/19/2013 0820   CHOLHDL 3.0 08/08/2016 0828   VLDL 17 08/08/2016 0828   LDLCALC 119 (H) 06/29/2017 1043   LDLCALC 114 (H) 12/19/2013 0820   Hepatic Function Panel     Component Value Date/Time   PROT 6.3 06/29/2017 1043   ALBUMIN 4.0 06/29/2017 1043   AST 17 06/29/2017 1043   ALT 16 06/29/2017 1043   ALKPHOS 73 06/29/2017 1043   BILITOT 0.3 06/29/2017 1043   BILIDIR <0.1 01/09/2009 1604   IBILI NOT CALCULATED 01/09/2009 1604      Component Value Date/Time   TSH 0.503 06/29/2017 1043   TSH 0.48 04/13/2017 0858   TSH 0.70 12/29/2014 0923    ASSESSMENT AND PLAN: Vitamin D deficiency - Plan: Vitamin D, Ergocalciferol, (DRISDOL) 50000 units CAPS capsule  Essential hypertension  Class 2 severe obesity with serious comorbidity and body mass index (BMI) of 37.0 to 37.9 in adult, unspecified obesity type ([place])  PLAN:  Vitamin D Deficiency [name] was informed that low vitamin D levels contributes to fatigue and are associated with obesity, breast, and colon cancer. She agrees to continue to take prescription Vit D @50 ,000 IU every week #4 with no refills and will follow up for routine testing of vitamin D, at least 2-3 times per year. She was informed of the risk of over-replacement of vitamin D and agrees to not  increase her dose unless he discusses this with [place] first. [name] agrees to follow up with our clinic in 3 weeks.  Hypertension We discussed sodium restriction, working on healthy weight loss, and a regular exercise program as the means to achieve improved blood pressure control. [name] agreed with this plan and agreed to follow up as directed. We will continue to monitor her blood pressure as well as her progress with the above lifestyle modifications. She will continue her medications as prescribed and will watch for signs of hypotension as she continues her lifestyle modifications.  Obesity [name] is currently in the action stage of change. As such, her goal is to continue with weight loss efforts She has agreed to keep a food journal with 1200 calories and 90 grams of protein daily [name] has been instructed to work up to a goal of 150 minutes of combined cardio and strengthening exercise per week for weight loss and overall health benefits. We discussed the following Behavioral Modification Strategies today: increasing lean protein intake and keep a strict food journal  [name] has agreed to follow up with our clinic in 3 weeks. She was informed of the importance of frequent follow up visits to maximize her success with intensive lifestyle modifications for her multiple health conditions.  I, [name], am acting as transcriptionist for Shelby Duverney, [name]  I have reviewed the above documentation for accuracy and completeness, and I agree with the above. -Shelby Duverney, [name]  I have reviewed the above note and agree with the plan. -Shelby Nip, [name]   OBESITY BEHAVIORAL INTERVENTION VISIT  Today's visit was # 7 out of 22.  Starting weight: 221 lbs Starting date: 06/29/17 Today's weight : 209 lbs  Today's date: 10/05/2017 Total lbs lost to date: 12 (Patients must lose 7 lbs in the first 6 months to continue with counseling)   ASK: We discussed the diagnosis of obesity with  [name] today and Sacha agreed to give [place] permission to discuss obesity behavioral modification therapy today.  ASSESS: Dacie has the diagnosis of obesity and her BMI today is 37.03 Naylin is in the action stage of change   ADVISE: Jozlynn was educated on the multiple health risks of obesity as well as the benefit of weight loss to improve her health. She was advised of the need for long term treatment and the importance of lifestyle modifications.  AGREE: Multiple dietary modification options and treatment options were discussed and  Angeli agreed to keep a food journal with 1200 calories and 90 grams of protein daily We discussed the following Behavioral Modification Strategies today: increasing lean protein intake and keep a strict food journal

## 2017-10-10 NOTE — Progress Notes (Deleted)
[place] [facility] [address], [place], [place] [address] [phone]  Date:  10/11/2017   Name:  [name]   DOB:  [birth year]   MRN:  [id number]  PCP:  Darreld Mclean, MD    Chief Complaint: No chief complaint on file.   History of Present Illness:  [name] is a 66 y.o. very pleasant female patient who presents with the following:  6 month follow-up visit History of pre-diabetes, obesity, hyperlipidemia, HTN, depression Last seen here in May of this year She is going to the weight management center  Wt Readings from Last 3 Encounters:  10/05/17 209 lb (94.8 kg)  09/13/17 210 lb (95.3 kg)  08/29/17 214 lb (97.1 kg)   Lab Results  Component Value Date   HGBA1C 5.7 (H) 06/29/2017   In May she was 222 lbs  Patient Active Problem List   Diagnosis Date Noted  . Class 2 obesity with serious comorbidity and body mass index (BMI) of 38.0 to 38.9 in adult 07/27/2017  . Vitamin D deficiency 07/27/2017  . Other hyperlipidemia 06/29/2017  . Prediabetes 06/29/2017  . Other fatigue 06/29/2017  . Shortness of breath on exertion 06/29/2017  . Class 2 obesity with serious comorbidity and body mass index (BMI) of 39.0 to 39.9 in adult 06/29/2017  . Left hip pain 04/18/2017  . Pre-diabetes 02/05/2016  . Left thyroid nodule 11/10/2014  . TBI (traumatic brain injury) ([place]) 07/15/2014  . Chest pain 12/13/2013  . Dyslipidemia 12/13/2013  . Obesity (BMI 35.0-39.9 without comorbidity) 12/13/2013  . Hypertension 01/18/2012  . GERD (gastroesophageal reflux disease) 01/18/2012  . Depression 01/18/2012  . Migraine syndrome 01/18/2012    Past Medical History:  Diagnosis Date  . Allergy   . Anxiety   . Arthritis   . Back pain   . Constipation   . Depression   . Dyslipidemia   . GERD (gastroesophageal reflux disease)   . Headaches due to old head injury   . History of stomach ulcers   .  Hyperlipidemia   . Hypertension   . Lactose intolerance   . Migraines   . Obesity   . Prediabetes   . Reflux   . Thyroid disease     Past Surgical History:  Procedure Laterality Date  . ABDOMINAL HYSTERECTOMY    . BREAST SURGERY  2003   reduction  . CESAREAN SECTION    . FACET JOINT INJECTION  2011 and 2012  . MYOMECTOMY    . NM MYOCAR PERF WALL MOTION  03/2009   dipyridamole; normal pattern of perfusion in all regions, post-stress EF 65%, normal study   . THYROIDECTOMY  1989  . TRANSTHORACIC ECHOCARDIOGRAM  03/2009   EF=>55%, borderline conc LVH; mild mitral annular calcif, trace MR; trace TR; AV mildly sclerotic, mild AV regurg; mild pulm valve regurg   . TUBAL LIGATION      Social History   Tobacco Use  . Smoking status: Never Smoker  . Smokeless tobacco: Never Used  Substance Use Topics  . Alcohol use: Yes    Alcohol/week: 0.5 - 1.0 oz    Types: 1 - 2 Standard drinks or equivalent per week  . Drug use: No    Family History  Problem Relation Age of Onset  . Heart disease Mother        MI, HTN  . Hypertension Mother   . Thyroid disease Mother   . Hyperlipidemia  Mother   . Sudden death Mother   . Anxiety disorder Mother   . Heart disease Maternal Grandmother        MI, CVA  . Heart disease Maternal Grandfather        MI  . Stroke Maternal Grandfather   . Heart disease Paternal Grandmother   . Stroke Paternal Grandmother   . Heart disease Paternal Grandfather        MI  . Glaucoma Father   . Diabetes Father   . Depression Daughter   . ADD / ADHD Son   . Thyroid disease Sister   . Heart attack Paternal Uncle        HTN  . Hypertension Paternal Uncle     Allergies  Allergen Reactions  . Metoprolol Hives  . Onabotulinumtoxina (Cosmetic) Swelling  . Botox [Botulinum Toxin Type A]   . Hydrochlorothiazide Other (See Comments)    Increases risk of gout flares  . Toprol Xl [Metoprolol Succinate] Hives    Medication list has been reviewed and  updated.  Current Outpatient Medications on File Prior to Visit  Medication Sig Dispense Refill  . acetaminophen-codeine (TYLENOL #3) 300-30 MG tablet Take 1-2 tablets by mouth every 8 (eight) hours as needed for moderate pain. 30 tablet 0  . ALPRAZolam (XANAX) 0.25 MG tablet Take 0.25 mg by mouth 2 (two) times daily as needed for sleep.    Marland Kitchen amLODipine (NORVASC) 10 MG tablet TAKE 1 TABLET BY MOUTH DAILY 30 tablet 11  . aspirin 81 MG tablet Take 81 mg by mouth daily.    . carvedilol (COREG) 3.125 MG tablet Take 1 tablet (3.125 mg total) by mouth 2 (two) times daily. 60 tablet 2  . desvenlafaxine (PRISTIQ) 100 MG 24 hr tablet Take 100 mg by mouth daily.    . diclofenac (FLECTOR) 1.3 % PTCH Place 1 patch onto the skin 2 (two) times daily.    . Diclofenac Potassium (CAMBIA) 50 MG PACK Take by mouth.    . diclofenac sodium (VOLTAREN) 1 % GEL Apply 2 g topically 4 (four) times daily. 100 g 3  . Eszopiclone (ESZOPICLONE) 3 MG TABS Take 3 mg by mouth at bedtime. Take immediately before bedtime    . fluconazole (DIFLUCAN) 150 MG tablet Take 150 mg by mouth daily.    . frovatriptan (FROVA) 2.5 MG tablet Take 2.5 mg by mouth as needed for migraine. If recurs, may repeat after 2 hours. Max of 3 tabs in 24 hours.    . gabapentin (NEURONTIN) 300 MG capsule Take 300 mg by mouth 3 (three) times daily.    Marland Kitchen ipratropium (ATROVENT) 0.03 % nasal spray Place 2 sprays into the nose 4 (four) times daily. 30 mL 1  . metFORMIN (GLUCOPHAGE) 500 MG tablet Take 1 tablet (500 mg total) by mouth daily. 30 tablet 0  . nitroGLYCERIN (NITROSTAT) 0.4 MG SL tablet Place 1 tablet (0.4 mg total) under the tongue every 5 (five) minutes as needed. 30 tablet 0  . Olopatadine HCl (PATADAY) 0.2 % SOLN Apply 1 drop to eye daily as needed (for itching). 2.5 mL 3  . polyethylene glycol (MIRALAX / GLYCOLAX) packet Take 17 g by mouth daily.    . valACYclovir (VALTREX) 500 MG tablet Take 1 tablet (500 mg total) by mouth 2 (two) times daily.  Use for 3 days as needed for outbreak 30 tablet 2  . Vitamin D, Ergocalciferol, (DRISDOL) 50000 units CAPS capsule Take 1 capsule (50,000 Units total) every 7 (seven) days  by mouth. 4 capsule 0   No current facility-administered medications on file prior to visit.     Review of Systems:  As per HPI- otherwise negative.   Physical Examination: There were no vitals filed for this visit. There were no vitals filed for this visit. There is no height or weight on file to calculate BMI. Ideal Body Weight:    GEN: WDWN, NAD, Non-toxic, A & O x 3 HEENT: Atraumatic, Normocephalic. Neck supple. No masses, No LAD. Ears and Nose: No external deformity. CV: RRR, No M/G/R. No JVD. No thrill. No extra heart sounds. PULM: CTA B, no wheezes, crackles, rhonchi. No retractions. No resp. distress. No accessory muscle use. ABD: S, NT, ND, +BS. No rebound. No HSM. EXTR: No c/c/e NEURO Normal gait.  PSYCH: Normally interactive. Conversant. Not depressed or anxious appearing.  Calm demeanor.    Assessment and Plan: ***  Signed Lamar Blinks, MD

## 2017-10-11 ENCOUNTER — Ambulatory Visit: Payer: Self-pay | Admitting: Family Medicine

## 2017-10-12 ENCOUNTER — Encounter: Payer: Self-pay | Admitting: Family Medicine

## 2017-10-12 ENCOUNTER — Ambulatory Visit: Payer: BC Managed Care – PPO | Admitting: Family Medicine

## 2017-10-12 VITALS — BP 122/82 | HR 83 | Temp 97.8°F | Ht 63.0 in | Wt 213.8 lb

## 2017-10-12 DIAGNOSIS — R6889 Other general symptoms and signs: Secondary | ICD-10-CM

## 2017-10-12 DIAGNOSIS — R634 Abnormal weight loss: Secondary | ICD-10-CM | POA: Diagnosis not present

## 2017-10-12 DIAGNOSIS — D1724 Benign lipomatous neoplasm of skin and subcutaneous tissue of left leg: Secondary | ICD-10-CM

## 2017-10-12 DIAGNOSIS — Z23 Encounter for immunization: Secondary | ICD-10-CM | POA: Diagnosis not present

## 2017-10-12 MED ORDER — OLOPATADINE HCL 0.2 % OP SOLN: 1.0000 [drp] | Freq: Every day | OPHTHALMIC | 9 refills | Status: DC | PRN

## 2017-10-12 NOTE — Patient Instructions (Addendum)
Good to see you today-  You got your flu shot  I refilled your eye drops BP looks great Excellent job with weight loss!    We will arrange for an ultrasound of the spot on your left thigh to make sure it is nothing of concern  Take care and let's plan to visit in about 6 months

## 2017-10-12 NOTE — Progress Notes (Signed)
[facility] [address] [place], [place] [address] [phone]  Date:  10/12/2017   Name:  [name]   DOB:  [birth year]   MRN:  [id number]  PCP:  Darreld Mclean, MD    Chief Complaint: Follow-up (Pt here for 6 month f/u visit. Flu vaccine. Refiill on olopatadine 0.2% eye drops. )   History of Present Illness:  [name] is a 66 y.o. very pleasant female patient who presents with the following:  Follow-up visit today Last seen by myself in may-  We discussed her trochanteric bursitis and weight gain We also did a bone density for her which was normal  Due for flu shot Pap in 2014 - negative, HPV negative, and she is s/p hysterectomy  She is also going [facility] the [facility] -  Working on changing her eating habits  She is eating higher protein, lower carbs, more water, some snacking during the day.  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 It seemed to arise spontaneously, and it is worrying her just a bit   Lab Results  Component Value Date   HGBA1C 5.7 (H) 06/29/2017   Wt Readings from Last 3 Encounters:  10/12/17 213 lb 12.8 oz (97 kg)  10/05/17 209 lb (94.8 kg)  09/13/17 210 lb (95.3 kg)    Patient Active Problem List   Diagnosis Date Noted  . Class 2 obesity with serious comorbidity  and body mass index (BMI) of 38.0 to 38.9 in adult 07/27/2017  . Vitamin D deficiency 07/27/2017  . Other hyperlipidemia 06/29/2017  . Prediabetes 06/29/2017  . Other fatigue 06/29/2017  . Shortness of breath on exertion 06/29/2017  . Class 2 obesity with serious comorbidity and body mass index (BMI) of 39.0 to 39.9 in adult 06/29/2017  . Left hip pain 04/18/2017  . Pre-diabetes 02/05/2016  . Left thyroid nodule 11/10/2014  . TBI (traumatic brain injury) ([place]) 07/15/2014  . Chest pain 12/13/2013  . Dyslipidemia 12/13/2013  . Obesity (BMI 35.0-39.9 without comorbidity) 12/13/2013  . Hypertension 01/18/2012  . GERD (gastroesophageal reflux disease) 01/18/2012  . Depression 01/18/2012  . Migraine syndrome 01/18/2012    Past Medical History:  Diagnosis Date  . Allergy   . Anxiety   . Arthritis   . Back pain   . Constipation   . Depression   . Dyslipidemia   . GERD (gastroesophageal reflux disease)   . Headaches due to old head injury   . History of stomach ulcers   . Hyperlipidemia   . Hypertension   . Lactose intolerance   . Migraines   . Obesity   . Prediabetes   . Reflux   . Thyroid disease  Past Surgical History:  Procedure Laterality Date  . ABDOMINAL HYSTERECTOMY    . BREAST LUMPECTOMY WITH RADIOACTIVE SEED LOCALIZATION Left 08/23/2016   Procedure: LEFT BREAST LUMPECTOMY WITH RADIOACTIVE SEED LOCALIZATION;  Surgeon: Rolm Bookbinder, MD;  Location: [facility];  Service: General;  Laterality: Left;  . BREAST SURGERY  2003   reduction  . CESAREAN SECTION    . FACET JOINT INJECTION  2011 and 2012  . MYOMECTOMY    . NM MYOCAR PERF WALL MOTION  03/2009   dipyridamole; normal pattern of perfusion in all regions, post-stress EF 65%, normal study   . THYROIDECTOMY  1989  . TRANSTHORACIC ECHOCARDIOGRAM  03/2009   EF=>55%, borderline conc LVH; mild mitral annular calcif, trace MR; trace TR; AV mildly sclerotic, mild AV regurg; mild pulm valve regurg    . TUBAL LIGATION      Social History   Tobacco Use  . Smoking status: Never Smoker  . Smokeless tobacco: Never Used  Substance Use Topics  . Alcohol use: Yes    Alcohol/week: 0.5 - 1.0 oz    Types: 1 - 2 Standard drinks or equivalent per week  . Drug use: No    Family History  Problem Relation Age of Onset  . Heart disease Mother        MI, HTN  . Hypertension Mother   . Thyroid disease Mother   . Hyperlipidemia Mother   . Sudden death Mother   . Anxiety disorder Mother   . Heart disease Maternal Grandmother        MI, CVA  . Heart disease Maternal Grandfather        MI  . Stroke Maternal Grandfather   . Heart disease Paternal Grandmother   . Stroke Paternal Grandmother   . Heart disease Paternal Grandfather        MI  . Glaucoma Father   . Diabetes Father   . Depression Daughter   . ADD / ADHD Son   . Thyroid disease Sister   . Heart attack Paternal Uncle        HTN  . Hypertension Paternal Uncle     Allergies  Allergen Reactions  . Metoprolol Hives  . Onabotulinumtoxina (Cosmetic) Swelling  . Botox [Botulinum Toxin Type A]   . Hydrochlorothiazide Other (See Comments)    Increases risk of gout flares  . Toprol Xl [Metoprolol Succinate] Hives    Medication list has been reviewed and updated.  Current Outpatient Medications on File Prior to Visit  Medication Sig Dispense Refill  . acetaminophen-codeine (TYLENOL #3) 300-30 MG tablet Take 1-2 tablets by mouth every 8 (eight) hours as needed for moderate pain. 30 tablet 0  . ALPRAZolam (XANAX) 0.25 MG tablet Take 0.25 mg by mouth 2 (two) times daily as needed for sleep.    Marland Kitchen amLODipine (NORVASC) 10 MG tablet TAKE 1 TABLET BY MOUTH DAILY 30 tablet 11  . aspirin 81 MG tablet Take 81 mg by mouth daily.    . carvedilol (COREG) 3.125 MG tablet Take 1 tablet (3.125 mg total) by mouth 2 (two) times daily. 60 tablet 2  . desvenlafaxine (PRISTIQ) 100 MG 24 hr tablet Take 100 mg by mouth daily.    . diclofenac  (FLECTOR) 1.3 % PTCH Place 1 patch onto the skin 2 (two) times daily.    . Diclofenac Potassium (CAMBIA) 50 MG PACK Take by mouth.    . diclofenac sodium (VOLTAREN) 1 % GEL Apply 2 g topically 4 (four) times daily.  100 g 3  . Eszopiclone (ESZOPICLONE) 3 MG TABS Take 3 mg by mouth at bedtime. Take immediately before bedtime    . fluconazole (DIFLUCAN) 150 MG tablet Take 150 mg by mouth daily.    . frovatriptan (FROVA) 2.5 MG tablet Take 2.5 mg by mouth as needed for migraine. If recurs, may repeat after 2 hours. Max of 3 tabs in 24 hours.    . gabapentin (NEURONTIN) 300 MG capsule Take 300 mg by mouth 3 (three) times daily.    Marland Kitchen ipratropium (ATROVENT) 0.03 % nasal spray Place 2 sprays into the nose 4 (four) times daily. 30 mL 1  . metFORMIN (GLUCOPHAGE) 500 MG tablet Take 1 tablet (500 mg total) by mouth daily. 30 tablet 0  . nitroGLYCERIN (NITROSTAT) 0.4 MG SL tablet Place 1 tablet (0.4 mg total) under the tongue every 5 (five) minutes as needed. 30 tablet 0  . polyethylene glycol (MIRALAX / GLYCOLAX) packet Take 17 g by mouth daily.    . valACYclovir (VALTREX) 500 MG tablet Take 1 tablet (500 mg total) by mouth 2 (two) times daily. Use for 3 days as needed for outbreak 30 tablet 2  . Vitamin D, Ergocalciferol, (DRISDOL) 50000 units CAPS capsule Take 1 capsule (50,000 Units total) every 7 (seven) days by mouth. 4 capsule 0   No current facility-administered medications on file prior to visit.     Review of Systems:  As per HPI- otherwise negative. No fever or chills No CP or SOB Mood is good Sleep is ok No rashes, ST, cough    Physical Examination: Vitals:   10/12/17 1109  BP: 122/82  Pulse: 83  Temp: 97.8 F (36.6 C)  SpO2: 98%   Vitals:   10/12/17 1109  Weight: 213 lb 12.8 oz (97 kg)  Height: 5\' 3"  (1.6 m)   Body mass index is 37.87 kg/m. Ideal Body Weight: Weight in (lb) to have BMI = 25: 140.8  GEN: WDWN, NAD, Non-toxic, A & O x 3, overweight, looks well HEENT:  Atraumatic, Normocephalic. Neck supple. No masses, No LAD. Ears and Nose: No external deformity. CV: RRR, No M/G/R. No JVD. No thrill. No extra heart sounds. PULM: CTA B, no wheezes, crackles, rhonchi. No retractions. No resp. distress. No accessory muscle use. ABD: S, NT, ND, +BS. No rebound. No HSM. EXTR: No c/c/e NEURO Normal gait.  PSYCH: Normally interactive. Conversant. Not depressed or anxious appearing.  Calm demeanor.  Left thigh- there is an approx 3x4 cm soft, slightly tender mass just under the groin crease. Likely a lipoma    Assessment and Plan: Itchy eyes - Plan: Olopatadine HCl (PATADAY) 0.2 % SOLN  Immunization due - Plan: Flu Vaccine QUAD 36+ mos IM (Fluarix & Fluzone Quad PF  Lipoma of left lower extremity - Plan: [place] LT LOWER EXTREM LTD SOFT TISSUE NON VASCULAR  Weight loss  Refill of pataday today Flu shot She is being treated for vitamin D def Will obtain US of the mass on her leg to try and determine if lipoma as suspected  She will continue her weight loss efforts- congratulated her on her progress so far   Signed Lamar Blinks, MD

## 2017-10-16 ENCOUNTER — Encounter: Payer: Self-pay | Admitting: Gastroenterology

## 2017-10-17 ENCOUNTER — Ambulatory Visit (HOSPITAL_BASED_OUTPATIENT_CLINIC_OR_DEPARTMENT_OTHER)
Admission: RE | Admit: 2017-10-17 | Discharge: 2017-10-17 | Disposition: A | Discharge status: Home / Self Care | Payer: BC Managed Care – PPO | Source: Ambulatory Visit | Attending: Family Medicine | Admitting: Family Medicine

## 2017-10-17 ENCOUNTER — Encounter: Payer: Self-pay | Admitting: Family Medicine

## 2017-10-17 DIAGNOSIS — D1724 Benign lipomatous neoplasm of skin and subcutaneous tissue of left leg: Secondary | ICD-10-CM | POA: Diagnosis not present

## 2017-10-17 DIAGNOSIS — D1720 Benign lipomatous neoplasm of skin and subcutaneous tissue of unspecified limb: Secondary | ICD-10-CM

## 2017-10-17 HISTORY — DX: Benign lipomatous neoplasm of skin and subcutaneous tissue of unspecified limb: D17.20

## 2017-10-25 ENCOUNTER — Ambulatory Visit (INDEPENDENT_AMBULATORY_CARE_PROVIDER_SITE_OTHER): Payer: BC Managed Care – PPO | Admitting: Physician Assistant

## 2017-10-25 VITALS — BP 161/94 | HR 70 | Temp 97.6°F | Ht 63.0 in | Wt 205.0 lb

## 2017-10-25 DIAGNOSIS — I10 Essential (primary) hypertension: Secondary | ICD-10-CM

## 2017-10-25 DIAGNOSIS — R7303 Prediabetes: Secondary | ICD-10-CM | POA: Diagnosis not present

## 2017-10-25 DIAGNOSIS — E559 Vitamin D deficiency, unspecified: Secondary | ICD-10-CM

## 2017-10-25 DIAGNOSIS — Z6836 Body mass index (BMI) 36.0-36.9, adult: Secondary | ICD-10-CM

## 2017-10-25 MED ORDER — METFORMIN HCL 500 MG PO TABS: 500.0000 mg | ORAL_TABLET | Freq: Every day | ORAL | 0 refills | Status: DC

## 2017-10-25 MED ORDER — VITAMIN D (ERGOCALCIFEROL) 1.25 MG (50000 UNIT) PO CAPS: 50000.0000 [IU] | ORAL_CAPSULE | ORAL | 0 refills | Status: DC

## 2017-10-25 NOTE — Progress Notes (Signed)
Office: [phone]  /  Fax: [phone]   HPI:   Chief Complaint: OBESITY [name] is here to discuss her progress with her obesity treatment plan. She is on the keep a food journal with 1200 calories and 90 grams of protein daily and is following her eating plan approximately 50 % of the time. She states she walked for 30 minutes 2 times per week. [name] continues to do well with weight loss. She continues to be mindful of her eating and she controls her portions. She would like more meal planning ideas. Her weight is 205 lb (93 kg) today and has had a weight loss of 4 pounds over a period of 3 weeks since her last visit. She has lost 16 lbs since starting treatment with [place].  Vitamin D deficiency [name] has a diagnosis of vitamin D deficiency. She is currently taking vit D and denies nausea, vomiting or muscle weakness.  Pre-Diabetes [name] has a diagnosis of pre-diabetes based on her elevated Hg A1c and was informed this puts her at greater risk of developing diabetes. She is taking metformin currently and continues to work on diet and exercise to decrease risk of diabetes. She denies nausea, polyphagia or hypoglycemia.  Hypertension [name] is a 66 y.o. female with hypertension. Her blood pressure is elevated today at 161/94 and she states she has had a migraine headache today. [name] states a rise in her blood pressure with migraine flare is common and states blood pressure at home is stable. [name] denies chest pain or shortness of breath on exertion. She is working weight loss to help control her blood pressure with the goal of decreasing her risk of heart attack and stroke. Gretchens blood pressure is not currently controlled.  ALLERGIES: Allergies  Allergen Reactions  . Metoprolol Hives  . Onabotulinumtoxina (Cosmetic) Swelling  . Botox [Botulinum Toxin Type A]   . Hydrochlorothiazide Other (See Comments)    Increases risk of gout flares   . Toprol Xl [Metoprolol Succinate] Hives    MEDICATIONS: Current Outpatient Medications on File Prior to Visit  Medication Sig Dispense Refill  . acetaminophen-codeine (TYLENOL #3) 300-30 MG tablet Take 1-2 tablets by mouth every 8 (eight) hours as needed for moderate pain. 30 tablet 0  . ALPRAZolam (XANAX) 0.25 MG tablet Take 0.25 mg by mouth 2 (two) times daily as needed for sleep.    Marland Kitchen amLODipine (NORVASC) 10 MG tablet TAKE 1 TABLET BY MOUTH DAILY 30 tablet 11  . aspirin 81 MG tablet Take 81 mg by mouth daily.    . carvedilol (COREG) 3.125 MG tablet Take 1 tablet (3.125 mg total) by mouth 2 (two) times daily. 60 tablet 2  . desvenlafaxine (PRISTIQ) 100 MG 24 hr tablet Take 100 mg by mouth daily.    . diclofenac (FLECTOR) 1.3 % PTCH Place 1 patch onto the skin 2 (two) times daily.    . Diclofenac Potassium (CAMBIA) 50 MG PACK Take by mouth.    . diclofenac sodium (VOLTAREN) 1 % GEL Apply 2 g topically 4 (four) times daily. 100 g 3  . Eszopiclone (ESZOPICLONE) 3 MG TABS Take 3 mg by mouth at bedtime. Take immediately before bedtime    . fluconazole (DIFLUCAN) 150 MG tablet Take 150 mg by mouth daily.    . frovatriptan (FROVA) 2.5 MG tablet Take 2.5 mg by mouth as needed for migraine. If recurs, may repeat after 2 hours. Max of 3 tabs in 24 hours.    . gabapentin (  NEURONTIN) 300 MG capsule Take 300 mg by mouth 3 (three) times daily.    Marland Kitchen ipratropium (ATROVENT) 0.03 % nasal spray Place 2 sprays into the nose 4 (four) times daily. 30 mL 1  . metFORMIN (GLUCOPHAGE) 500 MG tablet Take 1 tablet (500 mg total) by mouth daily. 30 tablet 0  . nitroGLYCERIN (NITROSTAT) 0.4 MG SL tablet Place 1 tablet (0.4 mg total) under the tongue every 5 (five) minutes as needed. 30 tablet 0  . Olopatadine HCl (PATADAY) 0.2 % SOLN Apply 1 drop daily as needed to eye (for itching). 2.5 mL 9  . polyethylene glycol (MIRALAX / GLYCOLAX) packet Take 17 g by mouth daily.    . valACYclovir (VALTREX) 500 MG tablet Take 1  tablet (500 mg total) by mouth 2 (two) times daily. Use for 3 days as needed for outbreak 30 tablet 2  . Vitamin D, Ergocalciferol, (DRISDOL) 50000 units CAPS capsule Take 1 capsule (50,000 Units total) every 7 (seven) days by mouth. 4 capsule 0   No current facility-administered medications on file prior to visit.     PAST MEDICAL HISTORY: Past Medical History:  Diagnosis Date  . Allergy   . Anxiety   . Arthritis   . Back pain   . Constipation   . Depression   . Dyslipidemia   . GERD (gastroesophageal reflux disease)   . Headaches due to old head injury   . History of stomach ulcers   . Hyperlipidemia   . Hypertension   . Lactose intolerance   . Migraines   . Obesity   . Prediabetes   . Reflux   . Thyroid disease     PAST SURGICAL HISTORY: Past Surgical History:  Procedure Laterality Date  . ABDOMINAL HYSTERECTOMY    . BREAST LUMPECTOMY WITH RADIOACTIVE SEED LOCALIZATION Left 08/23/2016   Procedure: LEFT BREAST LUMPECTOMY WITH RADIOACTIVE SEED LOCALIZATION;  Surgeon: Rolm Bookbinder, MD;  Location: [facility];  Service: General;  Laterality: Left;  . BREAST SURGERY  2003   reduction  . CESAREAN SECTION    . FACET JOINT INJECTION  2011 and 2012  . MYOMECTOMY    . NM MYOCAR PERF WALL MOTION  03/2009   dipyridamole; normal pattern of perfusion in all regions, post-stress EF 65%, normal study   . THYROIDECTOMY  1989  . TRANSTHORACIC ECHOCARDIOGRAM  03/2009   EF=>55%, borderline conc LVH; mild mitral annular calcif, trace MR; trace TR; AV mildly sclerotic, mild AV regurg; mild pulm valve regurg   . TUBAL LIGATION      SOCIAL HISTORY: Social History   Tobacco Use  . Smoking status: Never Smoker  . Smokeless tobacco: Never Used  Substance Use Topics  . Alcohol use: Yes    Alcohol/week: 0.5 - 1.0 oz    Types: 1 - 2 Standard drinks or equivalent per week  . Drug use: No    FAMILY HISTORY: Family History  Problem Relation Age of Onset  . Heart  disease Mother        MI, HTN  . Hypertension Mother   . Thyroid disease Mother   . Hyperlipidemia Mother   . Sudden death Mother   . Anxiety disorder Mother   . Heart disease Maternal Grandmother        MI, CVA  . Heart disease Maternal Grandfather        MI  . Stroke Maternal Grandfather   . Heart disease Paternal Grandmother   . Stroke Paternal Grandmother   .  Heart disease Paternal Grandfather        MI  . Glaucoma Father   . Diabetes Father   . Depression Daughter   . ADD / ADHD Son   . Thyroid disease Sister   . Heart attack Paternal Uncle        HTN  . Hypertension Paternal Uncle     ROS: Review of Systems  Constitutional: Positive for weight loss.  Respiratory: Negative for shortness of breath (on exertion).   Cardiovascular: Negative for chest pain.  Gastrointestinal: Negative for nausea and vomiting.  Musculoskeletal:       Negative muscle weakness  Neurological: Positive for headaches.  Endo/Heme/Allergies:       Negative polyphagia Negative hypoglycemia    PHYSICAL EXAM: Blood pressure (!) 161/94, pulse 70, temperature 97.6 F (36.4 C), temperature source Oral, height 5\' 3"  (1.6 m), weight 205 lb (93 kg), SpO2 97 %. Body mass index is 36.31 kg/m. Physical Exam  Constitutional: She is oriented to person, place, and time. She appears well-developed and well-nourished.  Cardiovascular: Normal rate.  Pulmonary/Chest: Effort normal.  Musculoskeletal: Normal range of motion.  Neurological: She is oriented to person, place, and time.  Skin: Skin is warm and dry.  Psychiatric: She has a normal mood and affect. Her behavior is normal.  Vitals reviewed.   RECENT LABS AND TESTS: BMET    Component Value Date/Time   NA 143 06/29/2017 1043   K 3.4 (L) 06/29/2017 1043   CL 101 06/29/2017 1043   CO2 27 06/29/2017 1043   GLUCOSE 92 06/29/2017 1043   GLUCOSE 107 (H) 04/13/2017 0858   BUN 10 06/29/2017 1043   CREATININE 0.71 06/29/2017 1043   CREATININE  0.73 09/29/2015 0935   CALCIUM 8.6 (L) 06/29/2017 1043   GFRNONAA 90 06/29/2017 1043   GFRAA 104 06/29/2017 1043   Lab Results  Component Value Date   HGBA1C 5.7 (H) 06/29/2017   HGBA1C 5.8 04/13/2017   HGBA1C 5.9 02/04/2016   HGBA1C  01/09/2009    5.8 (NOTE)   The ADA recommends the following therapeutic goal for glycemic   control related to Hgb A1C measurement:   Goal of Therapy:   < 7.0% Hgb A1C   Reference: American Diabetes Association: Clinical Practice   Recommendations 2008, Diabetes Care,  2008, 31:(Suppl 1).   Lab Results  Component Value Date   INSULIN 13.1 06/29/2017   CBC    Component Value Date/Time   WBC 3.6 06/29/2017 1043   WBC 3.8 (L) 04/13/2017 0858   RBC 4.53 06/29/2017 1043   RBC 4.72 04/13/2017 0858   HGB 13.4 06/29/2017 1043   HCT 39.9 06/29/2017 1043   PLT 265.0 04/13/2017 0858   MCV 88 06/29/2017 1043   MCH 29.6 06/29/2017 1043   MCH 29.1 08/25/2014 1002   MCHC 33.6 06/29/2017 1043   MCHC 33.5 04/13/2017 0858   RDW 14.1 06/29/2017 1043   LYMPHSABS 1.2 06/29/2017 1043   MONOABS 0.4 01/09/2009 0902   EOSABS 0.1 06/29/2017 1043   BASOSABS 0.0 06/29/2017 1043   Iron/TIBC/Ferritin/ %Sat No results found for: IRON, TIBC, FERRITIN, IRONPCTSAT Lipid Panel     Component Value Date/Time   CHOL 186 06/29/2017 1043   CHOL 206 (H) 12/19/2013 0820   TRIG 77 06/29/2017 1043   TRIG 111 12/19/2013 0820   HDL 52 06/29/2017 1043   HDL 70 12/19/2013 0820   CHOLHDL 3.0 08/08/2016 0828   VLDL 17 08/08/2016 0828   LDLCALC 119 (H) 06/29/2017 1043  [place] 114 (H) 12/19/2013 0820   Hepatic Function Panel     Component Value Date/Time   PROT 6.3 06/29/2017 1043   ALBUMIN 4.0 06/29/2017 1043   AST 17 06/29/2017 1043   ALT 16 06/29/2017 1043   ALKPHOS 73 06/29/2017 1043   BILITOT 0.3 06/29/2017 1043   BILIDIR <0.1 01/09/2009 1604   IBILI NOT CALCULATED 01/09/2009 1604      Component Value Date/Time   TSH 0.503 06/29/2017 1043   TSH 0.48 04/13/2017  0858   TSH 0.70 12/29/2014 0923    ASSESSMENT AND PLAN: Vitamin D deficiency - Plan: Vitamin D, Ergocalciferol, (DRISDOL) 50000 units CAPS capsule  Prediabetes - Plan: metFORMIN (GLUCOPHAGE) 500 MG tablet  Essential hypertension  Class 2 severe obesity with serious comorbidity and body mass index (BMI) of 36.0 to 36.9 in adult, unspecified obesity type ([place])  PLAN:  Vitamin D Deficiency Alica was informed that low vitamin D levels contributes to fatigue and are associated with obesity, breast, and colon cancer. She agrees to continue to take prescription Vit D @50 ,000 IU every week #4 with no refills and will follow up for routine testing of vitamin D, at least 2-3 times per year. She was informed of the risk of over-replacement of vitamin D and agrees to not increase her dose unless he discusses this with [place] first. Deola agrees to follow up with our clinic in 3 weeks.  Pre-Diabetes Kyesha will continue to work on weight loss, exercise, and decreasing simple carbohydrates in her diet to help decrease the risk of diabetes. We dicussed metformin including benefits and risks. She was informed that eating too many simple carbohydrates or too many calories at one sitting increases the likelihood of GI side effects. Kasee requested metformin for now and a prescription was written today for 1 month refill. Cortnie agreed to follow up with [place] as directed to monitor her progress.  Hypertension We discussed sodium restriction, working on healthy weight loss, and a regular exercise program as the means to achieve improved blood pressure control. Jolyn agreed with this plan and agreed to follow up as directed. We will continue to monitor her blood pressure as well as her progress with the above lifestyle modifications. She will continue her medications as prescribed and will watch for signs of hypotension as she continues her lifestyle modifications.  Obesity Fina is currently in the  action stage of change. As such, her goal is to continue with weight loss efforts She has agreed to keep a food journal with 1200 calories and 90 grams of protein daily Oceanna has been instructed to work up to a goal of 150 minutes of combined cardio and strengthening exercise per week for weight loss and overall health benefits. We discussed the following Behavioral Modification Strategies today: increasing lean protein intake and work on meal planning and easy cooking plans  Ming has agreed to follow up with our clinic in 3 weeks. She was informed of the importance of frequent follow up visits to maximize her success with intensive lifestyle modifications for her multiple health conditions.  I, Doreene Nest, am acting as transcriptionist for Lacy Duverney, PA-C  I have reviewed the above documentation for accuracy and completeness, and I agree with the above. -Lacy Duverney, PA-C  I have reviewed the above note and agree with the plan. -Dennard Nip, MD   OBESITY BEHAVIORAL INTERVENTION VISIT  Today's visit was # 8 out of 22.  Starting weight: 221 lbs Starting date: 06/29/17 Today's weight : 205  lbs  Today's date: 10/25/2017 Total lbs lost to date: 47 (Patients must lose 7 lbs in the first 6 months to continue with counseling)   ASK: We discussed the diagnosis of obesity with [name] today and Catia agreed to give [place] permission to discuss obesity behavioral modification therapy today.  ASSESS: Lelani has the diagnosis of obesity and her BMI today is 36.32 Karalyne is in the action stage of change   ADVISE: Annastyn was educated on the multiple health risks of obesity as well as the benefit of weight loss to improve her health. She was advised of the need for long term treatment and the importance of lifestyle modifications.  AGREE: Multiple dietary modification options and treatment options were discussed and  Caylie agreed to keep a food journal with  1200 calories and 90 grams of protein daily We discussed the following Behavioral Modification Strategies today: increasing lean protein intake and work on meal planning and easy cooking plans

## 2017-10-26 ENCOUNTER — Other Ambulatory Visit: Payer: Self-pay | Admitting: Internal Medicine

## 2017-11-14 ENCOUNTER — Ambulatory Visit (INDEPENDENT_AMBULATORY_CARE_PROVIDER_SITE_OTHER): Payer: BC Managed Care – PPO | Admitting: Physician Assistant

## 2017-11-14 VITALS — BP 171/95 | HR 50 | Temp 97.6°F | Ht 63.0 in | Wt 208.0 lb

## 2017-11-14 DIAGNOSIS — Z6836 Body mass index (BMI) 36.0-36.9, adult: Secondary | ICD-10-CM

## 2017-11-14 DIAGNOSIS — E559 Vitamin D deficiency, unspecified: Secondary | ICD-10-CM | POA: Diagnosis not present

## 2017-11-14 DIAGNOSIS — I10 Essential (primary) hypertension: Secondary | ICD-10-CM | POA: Diagnosis not present

## 2017-11-14 MED ORDER — LISINOPRIL 10 MG PO TABS: 10.0000 mg | ORAL_TABLET | Freq: Every day | ORAL | 0 refills | Status: DC

## 2017-11-14 MED ORDER — VITAMIN D (ERGOCALCIFEROL) 1.25 MG (50000 UNIT) PO CAPS: 50000.0000 [IU] | ORAL_CAPSULE | ORAL | 0 refills | Status: DC

## 2017-11-14 NOTE — Progress Notes (Signed)
Office: [phone]  /  Fax: [phone]   HPI:   Chief Complaint: OBESITY [name] is here to discuss her progress with her obesity treatment plan. She is on the  keep a food journal with 1200 calories and 90 grams of protein daily and is following her eating plan approximately 0 % of the time. She states she is exercising 0 minutes 0 times per week. [name] has been busy and has not been as mindful of her eating. She would like more meal planning ideas. Her weight is 208 lb (94.3 kg) today and has had a weight gain of 3 pounds over a period of 3 weeks since her last visit. She has lost 13 lbs since starting treatment with [place].  Vitamin D deficiency [name] has a diagnosis of vitamin D deficiency. She is currently taking vit D and denies nausea, vomiting or muscle weakness.  Hypertension [name] is a 66 y.o. female with hypertension. Her blood pressure is elevated today at 171/95 [name] is on Carvedilol and Diltiazem. She states she is under lots of stress. Her blood pressure at home is elevated. [name] denies chest pain or shortness of breath on exertion. She is working weight loss to help control her blood pressure with the goal of decreasing her risk of heart attack and stroke. [name] blood pressure is not currently controlled.  ALLERGIES: Allergies  Allergen Reactions  . Metoprolol Hives  . Onabotulinumtoxina (Cosmetic) Swelling  . Botox [Botulinum Toxin Type A]   . Hydrochlorothiazide Other (See Comments)    Increases risk of gout flares  . Toprol Xl [Metoprolol Succinate] Hives    MEDICATIONS: Current Outpatient Medications on File Prior to Visit  Medication Sig Dispense Refill  . acetaminophen-codeine (TYLENOL #3) 300-30 MG tablet Take 1-2 tablets by mouth every 8 (eight) hours as needed for moderate pain. 30 tablet 0  . ALPRAZolam (XANAX) 0.25 MG tablet Take 0.25 mg by mouth 2 (two) times daily as needed for sleep.    Marland Kitchen amLODipine  (NORVASC) 10 MG tablet TAKE 1 TABLET BY MOUTH DAILY 30 tablet 0  . aspirin 81 MG tablet Take 81 mg by mouth daily.    . carvedilol (COREG) 3.125 MG tablet Take 1 tablet (3.125 mg total) by mouth 2 (two) times daily. 60 tablet 2  . desvenlafaxine (PRISTIQ) 100 MG 24 hr tablet Take 100 mg by mouth daily.    . diclofenac (FLECTOR) 1.3 % PTCH Place 1 patch onto the skin 2 (two) times daily.    . Diclofenac Potassium (CAMBIA) 50 MG PACK Take by mouth.    . diclofenac sodium (VOLTAREN) 1 % GEL Apply 2 g topically 4 (four) times daily. 100 g 3  . Eszopiclone (ESZOPICLONE) 3 MG TABS Take 3 mg by mouth at bedtime. Take immediately before bedtime    . fluconazole (DIFLUCAN) 150 MG tablet Take 150 mg by mouth daily.    . frovatriptan (FROVA) 2.5 MG tablet Take 2.5 mg by mouth as needed for migraine. If recurs, may repeat after 2 hours. Max of 3 tabs in 24 hours.    . gabapentin (NEURONTIN) 300 MG capsule Take 300 mg by mouth 3 (three) times daily.    Marland Kitchen ipratropium (ATROVENT) 0.03 % nasal spray Place 2 sprays into the nose 4 (four) times daily. 30 mL 1  . metFORMIN (GLUCOPHAGE) 500 MG tablet Take 1 tablet (500 mg total) by mouth daily. 30 tablet 0  . nitroGLYCERIN (NITROSTAT) 0.4 MG SL tablet Place 1 tablet (0.4  mg total) under the tongue every 5 (five) minutes as needed. 30 tablet 0  . Olopatadine HCl (PATADAY) 0.2 % SOLN Apply 1 drop daily as needed to eye (for itching). 2.5 mL 9  . polyethylene glycol (MIRALAX / GLYCOLAX) packet Take 17 g by mouth daily.    . valACYclovir (VALTREX) 500 MG tablet Take 1 tablet (500 mg total) by mouth 2 (two) times daily. Use for 3 days as needed for outbreak 30 tablet 2   No current facility-administered medications on file prior to visit.     PAST MEDICAL HISTORY: Past Medical History:  Diagnosis Date  . Allergy   . Anxiety   . Arthritis   . Back pain   . Constipation   . Depression   . Dyslipidemia   . GERD (gastroesophageal reflux disease)   . Headaches due  to old head injury   . History of stomach ulcers   . Hyperlipidemia   . Hypertension   . Lactose intolerance   . Migraines   . Obesity   . Prediabetes   . Reflux   . Thyroid disease     PAST SURGICAL HISTORY: Past Surgical History:  Procedure Laterality Date  . ABDOMINAL HYSTERECTOMY    . BREAST LUMPECTOMY WITH RADIOACTIVE SEED LOCALIZATION Left 08/23/2016   Procedure: LEFT BREAST LUMPECTOMY WITH RADIOACTIVE SEED LOCALIZATION;  Surgeon: Rolm Bookbinder, MD;  Location: [facility];  Service: General;  Laterality: Left;  . BREAST SURGERY  2003   reduction  . CESAREAN SECTION    . FACET JOINT INJECTION  2011 and 2012  . MYOMECTOMY    . NM MYOCAR PERF WALL MOTION  03/2009   dipyridamole; normal pattern of perfusion in all regions, post-stress EF 65%, normal study   . THYROIDECTOMY  1989  . TRANSTHORACIC ECHOCARDIOGRAM  03/2009   EF=>55%, borderline conc LVH; mild mitral annular calcif, trace MR; trace TR; AV mildly sclerotic, mild AV regurg; mild pulm valve regurg   . TUBAL LIGATION      SOCIAL HISTORY: Social History   Tobacco Use  . Smoking status: Never Smoker  . Smokeless tobacco: Never Used  Substance Use Topics  . Alcohol use: Yes    Alcohol/week: 0.5 - 1.0 oz    Types: 1 - 2 Standard drinks or equivalent per week  . Drug use: No    FAMILY HISTORY: Family History  Problem Relation Age of Onset  . Heart disease Mother        MI, HTN  . Hypertension Mother   . Thyroid disease Mother   . Hyperlipidemia Mother   . Sudden death Mother   . Anxiety disorder Mother   . Heart disease Maternal Grandmother        MI, CVA  . Heart disease Maternal Grandfather        MI  . Stroke Maternal Grandfather   . Heart disease Paternal Grandmother   . Stroke Paternal Grandmother   . Heart disease Paternal Grandfather        MI  . Glaucoma Father   . Diabetes Father   . Depression Daughter   . ADD / ADHD Son   . Thyroid disease Sister   . Heart attack  Paternal Uncle        HTN  . Hypertension Paternal Uncle     ROS: Review of Systems  Constitutional: Negative for weight loss.  Respiratory: Negative for shortness of breath (on exertion).   Cardiovascular: Negative for chest pain.  Gastrointestinal: Negative  for nausea and vomiting.  Musculoskeletal:       Negative muscle weakness    PHYSICAL EXAM: Blood pressure (!) 171/95, pulse (!) 50, temperature 97.6 F (36.4 C), height 5\' 3"  (1.6 m), weight 208 lb (94.3 kg), SpO2 99 %. Body mass index is 36.85 kg/m. Physical Exam  Constitutional: She is oriented to person, place, and time. She appears well-developed and well-nourished.  Cardiovascular: Normal rate.  Pulmonary/Chest: Effort normal.  Musculoskeletal: Normal range of motion.  Neurological: She is oriented to person, place, and time.  Skin: Skin is warm and dry.  Psychiatric: She has a normal mood and affect. Her behavior is normal.  Vitals reviewed.   RECENT LABS AND TESTS: BMET    Component Value Date/Time   NA 143 06/29/2017 1043   K 3.4 (L) 06/29/2017 1043   CL 101 06/29/2017 1043   CO2 27 06/29/2017 1043   GLUCOSE 92 06/29/2017 1043   GLUCOSE 107 (H) 04/13/2017 0858   BUN 10 06/29/2017 1043   CREATININE 0.71 06/29/2017 1043   CREATININE 0.73 09/29/2015 0935   CALCIUM 8.6 (L) 06/29/2017 1043   GFRNONAA 90 06/29/2017 1043   GFRAA 104 06/29/2017 1043   Lab Results  Component Value Date   HGBA1C 5.7 (H) 06/29/2017   HGBA1C 5.8 04/13/2017   HGBA1C 5.9 02/04/2016   HGBA1C  01/09/2009    5.8 (NOTE)   The ADA recommends the following therapeutic goal for glycemic   control related to Hgb A1C measurement:   Goal of Therapy:   < 7.0% Hgb A1C   Reference: American Diabetes Association: Clinical Practice   Recommendations 2008, Diabetes Care,  2008, 31:(Suppl 1).   Lab Results  Component Value Date   INSULIN 13.1 06/29/2017   CBC    Component Value Date/Time   WBC 3.6 06/29/2017 1043   WBC 3.8 (L)  04/13/2017 0858   RBC 4.53 06/29/2017 1043   RBC 4.72 04/13/2017 0858   HGB 13.4 06/29/2017 1043   HCT 39.9 06/29/2017 1043   PLT 265.0 04/13/2017 0858   MCV 88 06/29/2017 1043   MCH 29.6 06/29/2017 1043   MCH 29.1 08/25/2014 1002   MCHC 33.6 06/29/2017 1043   MCHC 33.5 04/13/2017 0858   RDW 14.1 06/29/2017 1043   LYMPHSABS 1.2 06/29/2017 1043   MONOABS 0.4 01/09/2009 0902   EOSABS 0.1 06/29/2017 1043   BASOSABS 0.0 06/29/2017 1043   Iron/TIBC/Ferritin/ %Sat No results found for: IRON, TIBC, FERRITIN, IRONPCTSAT Lipid Panel     Component Value Date/Time   CHOL 186 06/29/2017 1043   CHOL 206 (H) 12/19/2013 0820   TRIG 77 06/29/2017 1043   TRIG 111 12/19/2013 0820   HDL 52 06/29/2017 1043   HDL 70 12/19/2013 0820   CHOLHDL 3.0 08/08/2016 0828   VLDL 17 08/08/2016 0828   LDLCALC 119 (H) 06/29/2017 1043   LDLCALC 114 (H) 12/19/2013 0820   Hepatic Function Panel     Component Value Date/Time   PROT 6.3 06/29/2017 1043   ALBUMIN 4.0 06/29/2017 1043   AST 17 06/29/2017 1043   ALT 16 06/29/2017 1043   ALKPHOS 73 06/29/2017 1043   BILITOT 0.3 06/29/2017 1043   BILIDIR <0.1 01/09/2009 1604   IBILI NOT CALCULATED 01/09/2009 1604      Component Value Date/Time   TSH 0.503 06/29/2017 1043   TSH 0.48 04/13/2017 0858   TSH 0.70 12/29/2014 0923    ASSESSMENT AND PLAN: Vitamin D deficiency - Plan: Vitamin D, Ergocalciferol, (DRISDOL) 50000 units CAPS  capsule  Essential hypertension - Plan: lisinopril (PRINIVIL,ZESTRIL) 10 MG tablet  Class 2 severe obesity with serious comorbidity and body mass index (BMI) of 36.0 to 36.9 in adult, unspecified obesity type ([place])  PLAN:  Vitamin D Deficiency Yeny was informed that low vitamin D levels contributes to fatigue and are associated with obesity, breast, and colon cancer. She agrees to continue to take prescription Vit D @50 ,000 IU every week #4 with no refills and will follow up for routine testing of vitamin D, at least 2-3  times per year. She was informed of the risk of over-replacement of vitamin D and agrees to not increase her dose unless he discusses this with [place] first. Felina agrees to follow up with our clinic in 3 weeks.  Hypertension We discussed sodium restriction, working on healthy weight loss, and a regular exercise program as the means to achieve improved blood pressure control. Shanesha agreed with this plan and agreed to follow up as directed. We will continue to monitor her blood pressure as well as her progress with the above lifestyle modifications. Due to intolerance to diuretics in the past, she agrees to start lisinopril 10 mg qd #30 with no refills and to continue her other medications. She agrees to keep a BP log at home and to watch for signs of hypotension as she continues her lifestyle modifications.   Obesity Raygen is currently in the action stage of change. As such, her goal is to continue with weight loss efforts She has agreed to keep a food journal with 1200 calories and 90 grams of protein daily Yuri has been instructed to work up to a goal of 150 minutes of combined cardio and strengthening exercise per week for weight loss and overall health benefits. We discussed the following Behavioral Modification Strategies today: increasing lean protein intake and work on meal planning and easy cooking plans  Mycala has agreed to follow up with our clinic in 3 weeks. She was informed of the importance of frequent follow up visits to maximize her success with intensive lifestyle modifications for her multiple health conditions.  I, [name], am acting as transcriptionist for Shelby Duverney, PA-C  I have reviewed the above documentation for accuracy and completeness, and I agree with the above. -Shelby Duverney, PA-C  I have reviewed the above note and agree with the plan. -Shelby Nip, MD   OBESITY BEHAVIORAL INTERVENTION VISIT  Today's visit was # 9 out of 22.  Starting  weight: 221 lbs Starting date: 06/29/17 Today's weight : 208 lbs Today's date: 11/14/2017 Total lbs lost to date: 13 (Patients must lose 7 lbs in the first 6 months to continue with counseling)   ASK: We discussed the diagnosis of obesity with [name] today and Zenaida agreed to give [place] permission to discuss obesity behavioral modification therapy today.  ASSESS: Raseel has the diagnosis of obesity and her BMI today is 36.85 Sarah-Jane is in the action stage of change   ADVISE: Abbie was educated on the multiple health risks of obesity as well as the benefit of weight loss to improve her health. She was advised of the need for long term treatment and the importance of lifestyle modifications.  AGREE: Multiple dietary modification options and treatment options were discussed and  Nyliah agreed to keep a food journal with 1200 calories and 90 grams of protein daily We discussed the following Behavioral Modification Strategies today: increasing lean protein intake and work on meal planning and easy cooking plans

## 2017-11-27 ENCOUNTER — Other Ambulatory Visit: Payer: Self-pay | Admitting: Internal Medicine

## 2017-11-27 ENCOUNTER — Other Ambulatory Visit (INDEPENDENT_AMBULATORY_CARE_PROVIDER_SITE_OTHER): Payer: Self-pay | Admitting: Physician Assistant

## 2017-11-27 DIAGNOSIS — R7303 Prediabetes: Secondary | ICD-10-CM

## 2017-11-27 NOTE — Telephone Encounter (Signed)
REFILL 

## 2017-12-05 ENCOUNTER — Ambulatory Visit (INDEPENDENT_AMBULATORY_CARE_PROVIDER_SITE_OTHER): Payer: BC Managed Care – PPO | Admitting: Family Medicine

## 2017-12-05 ENCOUNTER — Ambulatory Visit (INDEPENDENT_AMBULATORY_CARE_PROVIDER_SITE_OTHER): Payer: BC Managed Care – PPO | Admitting: Physician Assistant

## 2017-12-05 ENCOUNTER — Encounter (INDEPENDENT_AMBULATORY_CARE_PROVIDER_SITE_OTHER): Payer: Self-pay

## 2017-12-12 ENCOUNTER — Encounter: Payer: Self-pay | Admitting: Family Medicine

## 2017-12-13 ENCOUNTER — Encounter: Payer: Self-pay | Admitting: Gastroenterology

## 2017-12-13 MED ORDER — VALACYCLOVIR HCL 500 MG PO TABS: 500.0000 mg | ORAL_TABLET | Freq: Two times a day (BID) | ORAL | 2 refills | Status: DC

## 2017-12-14 ENCOUNTER — Ambulatory Visit (INDEPENDENT_AMBULATORY_CARE_PROVIDER_SITE_OTHER): Payer: BC Managed Care – PPO | Admitting: Physician Assistant

## 2017-12-14 VITALS — BP 154/84 | HR 53 | Temp 97.7°F | Ht 63.0 in | Wt 209.0 lb

## 2017-12-14 DIAGNOSIS — I10 Essential (primary) hypertension: Secondary | ICD-10-CM | POA: Diagnosis not present

## 2017-12-14 DIAGNOSIS — E559 Vitamin D deficiency, unspecified: Secondary | ICD-10-CM

## 2017-12-14 DIAGNOSIS — Z6837 Body mass index (BMI) 37.0-37.9, adult: Secondary | ICD-10-CM | POA: Diagnosis not present

## 2017-12-14 MED ORDER — LISINOPRIL 10 MG PO TABS: 10.0000 mg | ORAL_TABLET | Freq: Every day | ORAL | 0 refills | Status: DC

## 2017-12-14 MED ORDER — VITAMIN D (ERGOCALCIFEROL) 1.25 MG (50000 UNIT) PO CAPS: 50000.0000 [IU] | ORAL_CAPSULE | ORAL | 0 refills | Status: DC

## 2017-12-14 NOTE — Progress Notes (Addendum)
Office: [phone]  /  Fax: [phone]   HPI:   Chief Complaint: OBESITY [name] is here to discuss her progress with her obesity treatment plan. She is on the keep a food journal with 1200 calories and 90 grams of protein daily and is following her eating plan approximately 0 % of the time. She states she is exercising 0 minutes 0 times per week. [name] has not been as mindful of her eating, as she was sick with an upper respiratory infection. She is motivated to get back on track and continue with weight loss.  Her weight is 209 lb (94.8 kg) today and has gained 1 pound since her last visit. She has lost 12 lbs since starting treatment with [place].  Hypertension [name] is a 67 y.o. female with hypertension. [name]'s blood pressure is elevated. She states she has not taken her blood pressure medications this morning. She states blood pressure at home is stable. [name] declines any medication adjustment. She denies chest pain or shortness of breath. She is working weight loss to help control her blood pressure with the goal of decreasing her risk of heart attack and stroke. [name]'s blood pressure is not currently controlled.  Vitamin D deficiency [name] has a diagnosis of vitamin D deficiency. She is currently taking prescription Vit D and denies nausea, vomiting or muscle weakness.  ALLERGIES: Allergies  Allergen Reactions  . Metoprolol Hives  . Onabotulinumtoxina (Cosmetic) Swelling  . Botox [Botulinum Toxin Type A]   . Hydrochlorothiazide Other (See Comments)    Increases risk of gout flares  . Toprol Xl [Metoprolol Succinate] Hives    MEDICATIONS: Current Outpatient Medications on File Prior to Visit  Medication Sig Dispense Refill  . acetaminophen-codeine (TYLENOL #3) 300-30 MG tablet Take 1-2 tablets by mouth every 8 (eight) hours as needed for moderate pain. 30 tablet 0  . ALPRAZolam (XANAX) 0.25 MG tablet Take 0.25 mg by mouth 2 (two) times daily  as needed for sleep.    Marland Kitchen amLODipine (NORVASC) 10 MG tablet TAKE 1 TABLET BY MOUTH DAILY 30 tablet 6  . aspirin 81 MG tablet Take 81 mg by mouth daily.    . carvedilol (COREG) 3.125 MG tablet Take 1 tablet (3.125 mg total) by mouth 2 (two) times daily. 60 tablet 2  . desvenlafaxine (PRISTIQ) 100 MG 24 hr tablet Take 100 mg by mouth daily.    . diclofenac (FLECTOR) 1.3 % PTCH Place 1 patch onto the skin 2 (two) times daily.    . Diclofenac Potassium (CAMBIA) 50 MG PACK Take by mouth.    . diclofenac sodium (VOLTAREN) 1 % GEL Apply 2 g topically 4 (four) times daily. 100 g 3  . Eszopiclone (ESZOPICLONE) 3 MG TABS Take 3 mg by mouth at bedtime. Take immediately before bedtime    . fluconazole (DIFLUCAN) 150 MG tablet Take 150 mg by mouth daily.    . frovatriptan (FROVA) 2.5 MG tablet Take 2.5 mg by mouth as needed for migraine. If recurs, may repeat after 2 hours. Max of 3 tabs in 24 hours.    . gabapentin (NEURONTIN) 300 MG capsule Take 300 mg by mouth 3 (three) times daily.    Marland Kitchen ipratropium (ATROVENT) 0.03 % nasal spray Place 2 sprays into the nose 4 (four) times daily. 30 mL 1  . metFORMIN (GLUCOPHAGE) 500 MG tablet TAKE 1 TABLET BY MOUTH EVERY DAY WITH FOOD 30 tablet 0  . nitroGLYCERIN (NITROSTAT) 0.4 MG SL tablet Place 1 tablet (0.4 mg  total) under the tongue every 5 (five) minutes as needed. 30 tablet 0  . Olopatadine HCl (PATADAY) 0.2 % SOLN Apply 1 drop daily as needed to eye (for itching). 2.5 mL 9  . polyethylene glycol (MIRALAX / GLYCOLAX) packet Take 17 g by mouth daily.    . valACYclovir (VALTREX) 500 MG tablet Take 1 tablet (500 mg total) by mouth 2 (two) times daily. Use for 3 days as needed for outbreak 30 tablet 2   No current facility-administered medications on file prior to visit.     PAST MEDICAL HISTORY: Past Medical History:  Diagnosis Date  . Allergy   . Anxiety   . Arthritis   . Back pain   . Constipation   . Depression   . Dyslipidemia   . GERD (gastroesophageal  reflux disease)   . Headaches due to old head injury   . History of stomach ulcers   . Hyperlipidemia   . Hypertension   . Lactose intolerance   . Migraines   . Obesity   . Prediabetes   . Reflux   . Thyroid disease     PAST SURGICAL HISTORY: Past Surgical History:  Procedure Laterality Date  . ABDOMINAL HYSTERECTOMY    . BREAST LUMPECTOMY WITH RADIOACTIVE SEED LOCALIZATION Left 08/23/2016   Procedure: LEFT BREAST LUMPECTOMY WITH RADIOACTIVE SEED LOCALIZATION;  Surgeon: Rolm Bookbinder, MD;  Location: [facility];  Service: General;  Laterality: Left;  . BREAST SURGERY  2003   reduction  . CESAREAN SECTION    . FACET JOINT INJECTION  2011 and 2012  . MYOMECTOMY    . NM MYOCAR PERF WALL MOTION  03/2009   dipyridamole; normal pattern of perfusion in all regions, post-stress EF 65%, normal study   . THYROIDECTOMY  1989  . TRANSTHORACIC ECHOCARDIOGRAM  03/2009   EF=>55%, borderline conc LVH; mild mitral annular calcif, trace MR; trace TR; AV mildly sclerotic, mild AV regurg; mild pulm valve regurg   . TUBAL LIGATION      SOCIAL HISTORY: Social History   Tobacco Use  . Smoking status: Never Smoker  . Smokeless tobacco: Never Used  Substance Use Topics  . Alcohol use: Yes    Alcohol/week: 0.5 - 1.0 oz    Types: 1 - 2 Standard drinks or equivalent per week  . Drug use: No    FAMILY HISTORY: Family History  Problem Relation Age of Onset  . Heart disease Mother        MI, HTN  . Hypertension Mother   . Thyroid disease Mother   . Hyperlipidemia Mother   . Sudden death Mother   . Anxiety disorder Mother   . Heart disease Maternal Grandmother        MI, CVA  . Heart disease Maternal Grandfather        MI  . Stroke Maternal Grandfather   . Heart disease Paternal Grandmother   . Stroke Paternal Grandmother   . Heart disease Paternal Grandfather        MI  . Glaucoma Father   . Diabetes Father   . Depression Daughter   . ADD / ADHD Son   . Thyroid  disease Sister   . Heart attack Paternal Uncle        HTN  . Hypertension Paternal Uncle     ROS: Review of Systems  Constitutional: Negative for weight loss.  Respiratory: Negative for shortness of breath.   Cardiovascular: Negative for chest pain.  Gastrointestinal: Negative for nausea and  vomiting.  Musculoskeletal:       Negative muscle weakness    PHYSICAL EXAM: Blood pressure (!) 154/84, pulse (!) 53, temperature 97.7 F (36.5 C), temperature source Oral, height 5\' 3"  (1.6 m), weight 209 lb (94.8 kg), SpO2 98 %. Body mass index is 37.02 kg/m. Physical Exam  Constitutional: She is oriented to person, place, and time. She appears well-developed and well-nourished.  Cardiovascular:  bradycardic  Pulmonary/Chest: Effort normal.  Musculoskeletal: Normal range of motion.  Neurological: She is oriented to person, place, and time.  Skin: Skin is warm and dry.  Psychiatric: She has a normal mood and affect. Her behavior is normal.  Vitals reviewed.   RECENT LABS AND TESTS: BMET    Component Value Date/Time   NA 143 06/29/2017 1043   K 3.4 (L) 06/29/2017 1043   CL 101 06/29/2017 1043   CO2 27 06/29/2017 1043   GLUCOSE 92 06/29/2017 1043   GLUCOSE 107 (H) 04/13/2017 0858   BUN 10 06/29/2017 1043   CREATININE 0.71 06/29/2017 1043   CREATININE 0.73 09/29/2015 0935   CALCIUM 8.6 (L) 06/29/2017 1043   GFRNONAA 90 06/29/2017 1043   GFRAA 104 06/29/2017 1043   Lab Results  Component Value Date   HGBA1C 5.7 (H) 06/29/2017   HGBA1C 5.8 04/13/2017   HGBA1C 5.9 02/04/2016   HGBA1C  01/09/2009    5.8 (NOTE)   The ADA recommends the following therapeutic goal for glycemic   control related to Hgb A1C measurement:   Goal of Therapy:   < 7.0% Hgb A1C   Reference: American Diabetes Association: Clinical Practice   Recommendations 2008, Diabetes Care,  2008, 31:(Suppl 1).   Lab Results  Component Value Date   INSULIN 13.1 06/29/2017   CBC    Component Value Date/Time    WBC 3.6 06/29/2017 1043   WBC 3.8 (L) 04/13/2017 0858   RBC 4.53 06/29/2017 1043   RBC 4.72 04/13/2017 0858   HGB 13.4 06/29/2017 1043   HCT 39.9 06/29/2017 1043   PLT 265.0 04/13/2017 0858   MCV 88 06/29/2017 1043   MCH 29.6 06/29/2017 1043   MCH 29.1 08/25/2014 1002   MCHC 33.6 06/29/2017 1043   MCHC 33.5 04/13/2017 0858   RDW 14.1 06/29/2017 1043   LYMPHSABS 1.2 06/29/2017 1043   MONOABS 0.4 01/09/2009 0902   EOSABS 0.1 06/29/2017 1043   BASOSABS 0.0 06/29/2017 1043   Iron/TIBC/Ferritin/ %Sat No results found for: IRON, TIBC, FERRITIN, IRONPCTSAT Lipid Panel     Component Value Date/Time   CHOL 186 06/29/2017 1043   CHOL 206 (H) 12/19/2013 0820   TRIG 77 06/29/2017 1043   TRIG 111 12/19/2013 0820   HDL 52 06/29/2017 1043   HDL 70 12/19/2013 0820   CHOLHDL 3.0 08/08/2016 0828   VLDL 17 08/08/2016 0828   LDLCALC 119 (H) 06/29/2017 1043   LDLCALC 114 (H) 12/19/2013 0820   Hepatic Function Panel     Component Value Date/Time   PROT 6.3 06/29/2017 1043   ALBUMIN 4.0 06/29/2017 1043   AST 17 06/29/2017 1043   ALT 16 06/29/2017 1043   ALKPHOS 73 06/29/2017 1043   BILITOT 0.3 06/29/2017 1043   BILIDIR <0.1 01/09/2009 1604   IBILI NOT CALCULATED 01/09/2009 1604      Component Value Date/Time   TSH 0.503 06/29/2017 1043   TSH 0.48 04/13/2017 0858   TSH 0.70 12/29/2014 0923  Results for VERTIS, BAUDER (MRN [id number]) as of 12/14/2017 10:53  Ref. Range 06/29/2017 10:43  Vitamin D, 25-Hydroxy Latest Ref Range: 30.0 - 100.0 ng/mL 21.7 (L)    ASSESSMENT AND PLAN: Essential hypertension - Plan: lisinopril (PRINIVIL,ZESTRIL) 10 MG tablet  Vitamin D deficiency - Plan: Vitamin D, Ergocalciferol, (DRISDOL) 50000 units CAPS capsule  Class 2 severe obesity with serious comorbidity and body mass index (BMI) of 37.0 to 37.9 in adult, unspecified obesity type ([place])  PLAN:  Hypertension We discussed sodium restriction, working on healthy weight loss, and a regular  exercise program as the means to achieve improved blood pressure control. Kymberlie agreed with this plan and agreed to follow up as directed. We will continue to monitor her blood pressure as well as her progress with the above lifestyle modifications. [name] agrees to continue taking lisinopril 10 mg qd #30 and we will refill for 1 month and she will watch for signs of hypotension as she continues her lifestyle modifications. [name] agrees to follow up with our clinic in 2 weeks.  Vitamin D Deficiency [name] was informed that low vitamin D levels contributes to fatigue and are associated with obesity, breast, and colon cancer. [name] agrees to continue taking prescription Vit D @50 ,000 IU every week #4 and we will refill for 1 month. She will follow up for routine testing of vitamin D, at least 2-3 times per year. She was informed of the risk of over-replacement of vitamin D and agrees to not increase her dose unless she discusses this with [place] first. [name] agrees to follow up with clinic in 2 weeks.  Obesity [name] is currently in the action stage of change. As such, her goal is to continue with weight loss efforts She has agreed to keep a food journal with 1200 calories and 90 grams of protein daily [name] has been instructed to work up to a goal of 150 minutes of combined cardio and strengthening exercise per week for weight loss and overall health benefits. We discussed the following Behavioral Modification Strategies today: increasing lean protein intake and work on meal planning and easy cooking plans   [name] has agreed to follow up with our clinic in 2 weeks. She was informed of the importance of frequent follow up visits to maximize her success with intensive lifestyle modifications for her multiple health conditions.    OBESITY BEHAVIORAL INTERVENTION VISIT  Today's visit was # 10 out of 22.  Starting weight: 221 lbs Starting date: 06/29/17 Today's weight : 209 lbs    Today's date: 12/14/2017 Total lbs lost to date: 12 (Patients must lose 7 lbs in the first 6 months to continue with counseling)   ASK: We discussed the diagnosis of obesity with [name] today and [name] agreed to give [place] permission to discuss obesity behavioral modification therapy today.  ASSESS: [name] has the diagnosis of obesity and her BMI today is 37.03 [name] is in the action stage of change   ADVISE: [name] was educated on the multiple health risks of obesity as well as the benefit of weight loss to improve her health. She was advised of the need for long term treatment and the importance of lifestyle modifications.  AGREE: Multiple dietary modification options and treatment options were discussed and  [name] agreed to the above obesity treatment plan.   Wilhemena Durie, am acting as transcriptionist for Lacy Duverney, PA-C I, Lacy Duverney [facility], have reviewed this note and agree with its contents.   I have reviewed the above documentation for accuracy and completeness, and I agree with the above. -Dennard Nip, MD

## 2017-12-25 ENCOUNTER — Encounter: Payer: Self-pay | Admitting: Family Medicine

## 2017-12-25 MED ORDER — SCOPOLAMINE 1 MG/3DAYS TD PT72: 1.0000 | MEDICATED_PATCH | TRANSDERMAL | 1 refills | Status: DC

## 2017-12-27 ENCOUNTER — Ambulatory Visit (INDEPENDENT_AMBULATORY_CARE_PROVIDER_SITE_OTHER): Payer: BC Managed Care – PPO | Admitting: Physician Assistant

## 2017-12-27 VITALS — BP 149/77 | HR 60 | Temp 97.8°F | Ht 63.0 in | Wt 206.0 lb

## 2017-12-27 DIAGNOSIS — I10 Essential (primary) hypertension: Secondary | ICD-10-CM | POA: Diagnosis not present

## 2017-12-27 DIAGNOSIS — E559 Vitamin D deficiency, unspecified: Secondary | ICD-10-CM

## 2017-12-27 DIAGNOSIS — Z6836 Body mass index (BMI) 36.0-36.9, adult: Secondary | ICD-10-CM

## 2017-12-27 MED ORDER — LISINOPRIL 10 MG PO TABS: 10.0000 mg | ORAL_TABLET | Freq: Every day | ORAL | 0 refills | Status: DC

## 2017-12-27 MED ORDER — VITAMIN D (ERGOCALCIFEROL) 1.25 MG (50000 UNIT) PO CAPS: 50000.0000 [IU] | ORAL_CAPSULE | ORAL | 0 refills | Status: DC

## 2017-12-27 NOTE — Progress Notes (Signed)
Office: [phone]  /  Fax: [phone]   HPI:   Chief Complaint: OBESITY [name] is here to discuss her progress with her obesity treatment plan. She is on the  keep a food journal with 1200 calories and 90 grams of protein daily and is following her eating plan approximately 50 % of the time. She states she is not exercising. [name] continues to do well with weight loss. She is mindful of her eating, but continues to struggle with her snacking choices. She will be going on vacation and would like travel eating strategies.  Her weight is 206 lb (93.4 kg) today and has had a weight loss of 3 pounds over a period of 2 weeks since her last visit. She has lost 15 lbs since starting treatment with [place].  Vitamin D deficiency [name] has a diagnosis of vitamin D deficiency. She is currently taking vit D and denies nausea, vomiting or muscle weakness.  Hypertension [name] is a 67 y.o. female with hypertension.  [name]'s blood pressure is elevated. She admits to occasional headaches, but denies chest pain or shortness of breath on exertion. She is working weight loss to help control her blood pressure with the goal of decreasing her risk of heart attack and stroke. Gretchens blood pressure is not currently controlled.     ALLERGIES: Allergies  Allergen Reactions  . Metoprolol Hives  . Onabotulinumtoxina (Cosmetic) Swelling  . Botox [Botulinum Toxin Type A]   . Hydrochlorothiazide Other (See Comments)    Increases risk of gout flares  . Toprol Xl [Metoprolol Succinate] Hives    MEDICATIONS: Current Outpatient Medications on File Prior to Visit  Medication Sig Dispense Refill  . acetaminophen-codeine (TYLENOL #3) 300-30 MG tablet Take 1-2 tablets by mouth every 8 (eight) hours as needed for moderate pain. 30 tablet 0  . ALPRAZolam (XANAX) 0.25 MG tablet Take 0.25 mg by mouth 2 (two) times daily as needed for sleep.    [name] amLODipine (NORVASC) 10 MG  tablet TAKE 1 TABLET BY MOUTH DAILY 30 tablet 6  . aspirin 81 MG tablet Take 81 mg by mouth daily.    . carvedilol (COREG) 3.125 MG tablet Take 1 tablet (3.125 mg total) by mouth 2 (two) times daily. 60 tablet 2  . desvenlafaxine (PRISTIQ) 100 MG 24 hr tablet Take 100 mg by mouth daily.    . diclofenac (FLECTOR) 1.3 % PTCH Place 1 patch onto the skin 2 (two) times daily.    . Diclofenac Potassium (CAMBIA) 50 MG PACK Take by mouth.    . diclofenac sodium (VOLTAREN) 1 % GEL Apply 2 g topically 4 (four) times daily. 100 g 3  . Eszopiclone (ESZOPICLONE) 3 MG TABS Take 3 mg by mouth at bedtime. Take immediately before bedtime    . fluconazole (DIFLUCAN) 150 MG tablet Take 150 mg by mouth daily.    . frovatriptan (FROVA) 2.5 MG tablet Take 2.5 mg by mouth as needed for migraine. If recurs, may repeat after 2 hours. Max of 3 tabs in 24 hours.    . gabapentin (NEURONTIN) 300 MG capsule Take 300 mg by mouth 3 (three) times daily.    [name] ipratropium (ATROVENT) 0.03 % nasal spray Place 2 sprays into the nose 4 (four) times daily. 30 mL 1  . lisinopril (PRINIVIL,ZESTRIL) 10 MG tablet Take 1 tablet (10 mg total) by mouth daily. 30 tablet 0  . metFORMIN (GLUCOPHAGE) 500 MG tablet TAKE 1 TABLET BY MOUTH EVERY DAY WITH FOOD 30  tablet 0  . nitroGLYCERIN (NITROSTAT) 0.4 MG SL tablet Place 1 tablet (0.4 mg total) under the tongue every 5 (five) minutes as needed. 30 tablet 0  . Olopatadine HCl (PATADAY) 0.2 % SOLN Apply 1 drop daily as needed to eye (for itching). 2.5 mL 9  . polyethylene glycol (MIRALAX / GLYCOLAX) packet Take 17 g by mouth daily.    [name] scopolamine (TRANSDERM-SCOP) 1 MG/3DAYS Place 1 patch (1.5 mg total) onto the skin every 3 (three) days. 5 patch 1  . valACYclovir (VALTREX) 500 MG tablet Take 1 tablet (500 mg total) by mouth 2 (two) times daily. Use for 3 days as needed for outbreak 30 tablet 2  . Vitamin D, Ergocalciferol, (DRISDOL) 50000 units CAPS capsule Take 1 capsule (50,000 Units total) by  mouth every 7 (seven) days. 4 capsule 0   No current facility-administered medications on file prior to visit.     PAST MEDICAL HISTORY: Past Medical History:  Diagnosis Date  . Allergy   . Anxiety   . Arthritis   . Back pain   . Constipation   . Depression   . Dyslipidemia   . GERD (gastroesophageal reflux disease)   . Headaches due to old head injury   . History of stomach ulcers   . Hyperlipidemia   . Hypertension   . Lactose intolerance   . Migraines   . Obesity   . Prediabetes   . Reflux   . Thyroid disease     PAST SURGICAL HISTORY: Past Surgical History:  Procedure Laterality Date  . ABDOMINAL HYSTERECTOMY    . BREAST LUMPECTOMY WITH RADIOACTIVE SEED LOCALIZATION Left 08/23/2016   Procedure: LEFT BREAST LUMPECTOMY WITH RADIOACTIVE SEED LOCALIZATION;  Surgeon: Rolm Bookbinder, MD;  Location: [place];  Service: General;  Laterality: Left;  . BREAST SURGERY  2003   reduction  . CESAREAN SECTION    . FACET JOINT INJECTION  2011 and 2012  . MYOMECTOMY    . NM MYOCAR PERF WALL MOTION  03/2009   dipyridamole; normal pattern of perfusion in all regions, post-stress EF 65%, normal study   . THYROIDECTOMY  1989  . TRANSTHORACIC ECHOCARDIOGRAM  03/2009   EF=>55%, borderline conc LVH; mild mitral annular calcif, trace MR; trace TR; AV mildly sclerotic, mild AV regurg; mild pulm valve regurg   . TUBAL LIGATION      SOCIAL HISTORY: Social History   Tobacco Use  . Smoking status: Never Smoker  . Smokeless tobacco: Never Used  Substance Use Topics  . Alcohol use: Yes    Alcohol/week: 0.5 - 1.0 oz    Types: 1 - 2 Standard drinks or equivalent per week  . Drug use: No    FAMILY HISTORY: Family History  Problem Relation Age of Onset  . Heart disease Mother        MI, HTN  . Hypertension Mother   . Thyroid disease Mother   . Hyperlipidemia Mother   . Sudden death Mother   . Anxiety disorder Mother   . Heart disease Maternal Grandmother         MI, CVA  . Heart disease Maternal Grandfather        MI  . Stroke Maternal Grandfather   . Heart disease Paternal Grandmother   . Stroke Paternal Grandmother   . Heart disease Paternal Grandfather        MI  . Glaucoma Father   . Diabetes Father   . Depression Daughter   . ADD /  ADHD Son   . Thyroid disease Sister   . Heart attack Paternal Uncle        HTN  . Hypertension Paternal Uncle     ROS: Review of Systems  Constitutional: Positive for weight loss.  Respiratory: Negative for shortness of breath.   Cardiovascular: Negative for chest pain.  Gastrointestinal: Negative for nausea and vomiting.  Musculoskeletal:       Negative muscle weakness  Neurological: Positive for headaches.    PHYSICAL EXAM: Blood pressure (!) 149/77, pulse 60, temperature 97.8 F (36.6 C), temperature source Oral, height 5\' 3"  (1.6 m), weight 206 lb (93.4 kg), SpO2 98 %. Body mass index is 36.49 kg/m. Physical Exam  Constitutional: She is oriented to person, place, and time. She appears well-developed and well-nourished.  HENT:  Head: Normocephalic.  Eyes: EOM are normal.  Neck: Normal range of motion.  Cardiovascular: Normal rate.  Pulmonary/Chest: Effort normal.  Musculoskeletal: Normal range of motion.  Neurological: She is alert and oriented to person, place, and time.  Skin: Skin is warm and dry.  Psychiatric: She has a normal mood and affect. Her behavior is normal.  Vitals reviewed.   RECENT LABS AND TESTS: BMET    Component Value Date/Time   NA 143 06/29/2017 1043   K 3.4 (L) 06/29/2017 1043   CL 101 06/29/2017 1043   CO2 27 06/29/2017 1043   GLUCOSE 92 06/29/2017 1043   GLUCOSE 107 (H) 04/13/2017 0858   BUN 10 06/29/2017 1043   CREATININE 0.71 06/29/2017 1043   CREATININE 0.73 09/29/2015 0935   CALCIUM 8.6 (L) 06/29/2017 1043   GFRNONAA 90 06/29/2017 1043   GFRAA 104 06/29/2017 1043   Lab Results  Component Value Date   HGBA1C 5.7 (H) 06/29/2017   HGBA1C 5.8  04/13/2017   HGBA1C 5.9 02/04/2016   HGBA1C  01/09/2009    5.8 (NOTE)   The ADA recommends the following therapeutic goal for glycemic   control related to Hgb A1C measurement:   Goal of Therapy:   < 7.0% Hgb A1C   Reference: American Diabetes Association: Clinical Practice   Recommendations 2008, Diabetes Care,  2008, 31:(Suppl 1).   Lab Results  Component Value Date   INSULIN 13.1 06/29/2017   CBC    Component Value Date/Time   WBC 3.6 06/29/2017 1043   WBC 3.8 (L) 04/13/2017 0858   RBC 4.53 06/29/2017 1043   RBC 4.72 04/13/2017 0858   HGB 13.4 06/29/2017 1043   HCT 39.9 06/29/2017 1043   PLT 265.0 04/13/2017 0858   MCV 88 06/29/2017 1043   MCH 29.6 06/29/2017 1043   MCH 29.1 08/25/2014 1002   MCHC 33.6 06/29/2017 1043   MCHC 33.5 04/13/2017 0858   RDW 14.1 06/29/2017 1043   LYMPHSABS 1.2 06/29/2017 1043   MONOABS 0.4 01/09/2009 0902   EOSABS 0.1 06/29/2017 1043   BASOSABS 0.0 06/29/2017 1043   Iron/TIBC/Ferritin/ %Sat No results found for: IRON, TIBC, FERRITIN, IRONPCTSAT Lipid Panel     Component Value Date/Time   CHOL 186 06/29/2017 1043   CHOL 206 (H) 12/19/2013 0820   TRIG 77 06/29/2017 1043   TRIG 111 12/19/2013 0820   HDL 52 06/29/2017 1043   HDL 70 12/19/2013 0820   CHOLHDL 3.0 08/08/2016 0828   VLDL 17 08/08/2016 0828   LDLCALC 119 (H) 06/29/2017 1043   LDLCALC 114 (H) 12/19/2013 0820   Hepatic Function Panel     Component Value Date/Time   PROT 6.3 06/29/2017 1043   ALBUMIN  4.0 06/29/2017 1043   AST 17 06/29/2017 1043   ALT 16 06/29/2017 1043   ALKPHOS 73 06/29/2017 1043   BILITOT 0.3 06/29/2017 1043   BILIDIR <0.1 01/09/2009 1604   IBILI NOT CALCULATED 01/09/2009 1604      Component Value Date/Time   TSH 0.503 06/29/2017 1043   TSH 0.48 04/13/2017 0858   TSH 0.70 12/29/2014 0923    ASSESSMENT AND PLAN: Vitamin D deficiency - Plan: Vitamin D, Ergocalciferol, (DRISDOL) 50000 units CAPS capsule  Essential hypertension - Plan: lisinopril  (PRINIVIL,ZESTRIL) 10 MG tablet  Class 2 severe obesity with serious comorbidity and body mass index (BMI) of 36.0 to 36.9 in adult, unspecified obesity type (HCC)  PLAN: Vitamin D Deficiency Kaleia was informed that low vitamin D levels contributes to fatigue and are associated with obesity, breast, and colon cancer. She agrees to continue to take prescription Vit D @50 ,000 IU every week #4 with no refills and will follow up for routine testing of vitamin D, at least 2-3 times per year. She was informed of the risk of over-replacement of vitamin D and agrees to not increase her dose unless she discusses this with [place] first.  Hypertension We discussed sodium restriction, working on healthy weight loss, and a regular exercise program as the means to achieve improved blood pressure control. Ilissa agreed with this plan and agreed to follow up as directed. We will continue to monitor her blood pressure as well as her progress with the above lifestyle modifications. She will continue her lisinopril 10 mg #30 with no refills and will watch for signs of hypotension as she continues her lifestyle modifications. She will follow up with her PCP for further management of her blood pressure.   Obesity Carylon is currently in the action stage of change. As such, her goal is to continue with weight loss efforts She has agreed to keep a food journal with 1500 calories and 90 grams of protein daily Josphine has been instructed to work up to a goal of 150 minutes of combined cardio and strengthening exercise per week for weight loss and overall health benefits. We discussed the following Behavioral Modification Stratagies today: increasing lean protein intake and work on meal planning and easy cooking plans   Pema has agreed to follow up with our clinic in 3 weeks. She was informed of the importance of frequent follow up visits to maximize her success with intensive lifestyle modifications for her  multiple health conditions.    Today's visit was # 11 out of 22.  Starting weight: 221 lbs Starting date: 06/29/17 Today's weight : 206 lbs Today's date: 12/27/2017 Total lbs lost to date: 15 (Patients must lose 7 lbs in the first 6 months to continue with counseling)   ASK: We discussed the diagnosis of obesity with [name] today and Enora agreed to give [place] permission to discuss obesity behavioral modification therapy today.  ASSESS: Ronnita has the diagnosis of obesity and her BMI today is 36.49 Marticia is in the action stage of change   ADVISE: Deneisha was educated on the multiple health risks of obesity as well as the benefit of weight loss to improve her health. She was advised of the need for long term treatment and the importance of lifestyle modifications.  AGREE: Multiple dietary modification options and treatment options were discussed and  Anne-Marie agreed to the above obesity treatment plan.   Leary Roca, am acting as transcriptionist for [employer], PA-C I, Lacy Duverney [facility], have  reviewed this note and agree with its content.

## 2018-01-17 ENCOUNTER — Ambulatory Visit (INDEPENDENT_AMBULATORY_CARE_PROVIDER_SITE_OTHER): Payer: BC Managed Care – PPO | Admitting: Physician Assistant

## 2018-01-17 VITALS — BP 162/82 | HR 64 | Temp 98.1°F | Ht 63.0 in | Wt 204.0 lb

## 2018-01-17 DIAGNOSIS — Z9189 Other specified personal risk factors, not elsewhere classified: Secondary | ICD-10-CM | POA: Diagnosis not present

## 2018-01-17 DIAGNOSIS — R7303 Prediabetes: Secondary | ICD-10-CM

## 2018-01-17 DIAGNOSIS — E559 Vitamin D deficiency, unspecified: Secondary | ICD-10-CM | POA: Diagnosis not present

## 2018-01-17 DIAGNOSIS — I10 Essential (primary) hypertension: Secondary | ICD-10-CM | POA: Diagnosis not present

## 2018-01-17 DIAGNOSIS — E66812 Obesity, class 2: Secondary | ICD-10-CM

## 2018-01-17 DIAGNOSIS — Z6836 Body mass index (BMI) 36.0-36.9, adult: Secondary | ICD-10-CM

## 2018-01-17 MED ORDER — VITAMIN D (ERGOCALCIFEROL) 1.25 MG (50000 UNIT) PO CAPS: 50000.0000 [IU] | ORAL_CAPSULE | ORAL | 0 refills | Status: DC

## 2018-01-17 MED ORDER — LISINOPRIL 10 MG PO TABS: 10.0000 mg | ORAL_TABLET | Freq: Every day | ORAL | 0 refills | Status: DC

## 2018-01-17 MED ORDER — METFORMIN HCL 500 MG PO TABS: 500.0000 mg | ORAL_TABLET | Freq: Every day | ORAL | 0 refills | Status: DC

## 2018-01-17 NOTE — Progress Notes (Signed)
Office: [phone]  /  Fax: [phone]   HPI:   Chief Complaint: OBESITY [name] is here to discuss [name] progress with [name] obesity treatment plan. She is on the  keep a food journal with 1500 calories and 90 grams of protein  and is following [name] eating plan approximately 70 % of the time. She states she is walking and dancing for 30 minutes 2 times per week. [name] continues to do well with weight loss despite being on a cruise. She managed to be mindful of [name] eating, and make smarter food choices. [name] is not keeping a food journal.  [name] weight is 204 lb (92.5 kg) today and has had a weight loss of 2 pounds over a period of 2 weeks since [name] last visit. She has lost 17 lbs since starting treatment with [place].  Hypertension SISTER CARBONE is a 67 y.o. female with hypertension.  [name] denies chest pain or shortness of breath on exertion. She is working weight loss to help control [name] blood pressure with the goal of decreasing [name] risk of heart attack and stroke. Gretchens blood pressure is not currently controlled. [place] advised on importance of blood pressure control to prevent complications such as stroke, heart disease, and death. She is currently taking amlodipine, carvedilol, and lisinopril.   Vitamin D deficiency Deeann has a diagnosis of vitamin D deficiency. She is currently taking vit D and denies nausea, vomiting or muscle weakness.    Ref. Range 06/29/2017 10:43  Vitamin D, 25-Hydroxy Latest Ref Range: 30.0 - 100.0 ng/mL 21.7 (L)   Pre-Diabetes Laterra has a diagnosis of prediabetes based on [name] elevated HgA1c and was informed this puts [name] at greater risk of developing diabetes. She is taking metformin currently and continues to work on diet and exercise to decrease risk of diabetes. She denies nausea, hypoglycemia, or polyphagia.  At risk for cardiovascular disease Ilse is at a higher than average risk for cardiovascular disease due to  obesity. She currently denies any chest pain.  ALLERGIES: Allergies  Allergen Reactions  . Metoprolol Hives  . Onabotulinumtoxina (Cosmetic) Swelling  . Botox [Botulinum Toxin Type A]   . Hydrochlorothiazide Other (See Comments)    Increases risk of gout flares  . Toprol Xl [Metoprolol Succinate] Hives    MEDICATIONS: Current Outpatient Medications on File Prior to Visit  Medication Sig Dispense Refill  . acetaminophen-codeine (TYLENOL #3) 300-30 MG tablet Take 1-2 tablets by mouth every 8 (eight) hours as needed for moderate pain. 30 tablet 0  . ALPRAZolam (XANAX) 0.25 MG tablet Take 0.25 mg by mouth 2 (two) times daily as needed for sleep.    Marland Kitchen amLODipine (NORVASC) 10 MG tablet TAKE 1 TABLET BY MOUTH DAILY 30 tablet 6  . aspirin 81 MG tablet Take 81 mg by mouth daily.    . carvedilol (COREG) 3.125 MG tablet Take 1 tablet (3.125 mg total) by mouth 2 (two) times daily. 60 tablet 2  . desvenlafaxine (PRISTIQ) 100 MG 24 hr tablet Take 100 mg by mouth daily.    . diclofenac (FLECTOR) 1.3 % PTCH Place 1 patch onto the skin 2 (two) times daily.    . Diclofenac Potassium (CAMBIA) 50 MG PACK Take by mouth.    . diclofenac sodium (VOLTAREN) 1 % GEL Apply 2 g topically 4 (four) times daily. 100 g 3  . Eszopiclone (ESZOPICLONE) 3 MG TABS Take 3 mg by mouth at bedtime. Take immediately before bedtime    . fluconazole (DIFLUCAN) 150  MG tablet Take 150 mg by mouth daily.    . frovatriptan (FROVA) 2.5 MG tablet Take 2.5 mg by mouth as needed for migraine. If recurs, may repeat after 2 hours. Max of 3 tabs in 24 hours.    . gabapentin (NEURONTIN) 300 MG capsule Take 300 mg by mouth 3 (three) times daily.    Marland Kitchen ipratropium (ATROVENT) 0.03 % nasal spray Place 2 sprays into the nose 4 (four) times daily. 30 mL 1  . lisinopril (PRINIVIL,ZESTRIL) 10 MG tablet Take 1 tablet (10 mg total) by mouth daily. 30 tablet 0  . metFORMIN (GLUCOPHAGE) 500 MG tablet TAKE 1 TABLET BY MOUTH EVERY DAY WITH FOOD 30 tablet  0  . nitroGLYCERIN (NITROSTAT) 0.4 MG SL tablet Place 1 tablet (0.4 mg total) under the tongue every 5 (five) minutes as needed. 30 tablet 0  . Olopatadine HCl (PATADAY) 0.2 % SOLN Apply 1 drop daily as needed to eye (for itching). 2.5 mL 9  . polyethylene glycol (MIRALAX / GLYCOLAX) packet Take 17 g by mouth daily.    Marland Kitchen scopolamine (TRANSDERM-SCOP) 1 MG/3DAYS Place 1 patch (1.5 mg total) onto the skin every 3 (three) days. 5 patch 1  . valACYclovir (VALTREX) 500 MG tablet Take 1 tablet (500 mg total) by mouth 2 (two) times daily. Use for 3 days as needed for outbreak 30 tablet 2  . Vitamin D, Ergocalciferol, (DRISDOL) 50000 units CAPS capsule Take 1 capsule (50,000 Units total) by mouth every 7 (seven) days. 4 capsule 0   No current facility-administered medications on file prior to visit.     PAST MEDICAL HISTORY: Past Medical History:  Diagnosis Date  . Allergy   . Anxiety   . Arthritis   . Back pain   . Constipation   . Depression   . Dyslipidemia   . GERD (gastroesophageal reflux disease)   . Headaches due to old head injury   . History of stomach ulcers   . Hyperlipidemia   . Hypertension   . Lactose intolerance   . Migraines   . Obesity   . Prediabetes   . Reflux   . Thyroid disease     PAST SURGICAL HISTORY: Past Surgical History:  Procedure Laterality Date  . ABDOMINAL HYSTERECTOMY    . BREAST LUMPECTOMY WITH RADIOACTIVE SEED LOCALIZATION Left 08/23/2016   Procedure: LEFT BREAST LUMPECTOMY WITH RADIOACTIVE SEED LOCALIZATION;  Surgeon: Rolm Bookbinder, MD;  Location: [facility];  Service: General;  Laterality: Left;  . BREAST SURGERY  2003   reduction  . CESAREAN SECTION    . FACET JOINT INJECTION  2011 and 2012  . MYOMECTOMY    . NM MYOCAR PERF WALL MOTION  03/2009   dipyridamole; normal pattern of perfusion in all regions, post-stress EF 65%, normal study   . THYROIDECTOMY  1989  . TRANSTHORACIC ECHOCARDIOGRAM  03/2009   EF=>55%, borderline  conc LVH; mild mitral annular calcif, trace MR; trace TR; AV mildly sclerotic, mild AV regurg; mild pulm valve regurg   . TUBAL LIGATION      SOCIAL HISTORY: Social History   Tobacco Use  . Smoking status: Never Smoker  . Smokeless tobacco: Never Used  Substance Use Topics  . Alcohol use: Yes    Alcohol/week: 0.5 - 1.0 oz    Types: 1 - 2 Standard drinks or equivalent per week  . Drug use: No    FAMILY HISTORY: Family History  Problem Relation Age of Onset  . Heart disease Mother  MI, HTN  . Hypertension Mother   . Thyroid disease Mother   . Hyperlipidemia Mother   . Sudden death Mother   . Anxiety disorder Mother   . Heart disease Maternal Grandmother        MI, CVA  . Heart disease Maternal Grandfather        MI  . Stroke Maternal Grandfather   . Heart disease Paternal Grandmother   . Stroke Paternal Grandmother   . Heart disease Paternal Grandfather        MI  . Glaucoma Father   . Diabetes Father   . Depression Daughter   . ADD / ADHD Son   . Thyroid disease Sister   . Heart attack Paternal Uncle        HTN  . Hypertension Paternal Uncle     ROS: Review of Systems  Constitutional: Positive for weight loss.  Cardiovascular: Negative for chest pain.  Gastrointestinal: Negative for nausea and vomiting.  Musculoskeletal:       Negative for muscle weakness  Endo/Heme/Allergies:       Negative for polyphagia  Negative for hypoglycemia     PHYSICAL EXAM: Blood pressure (!) 162/82, pulse 64, temperature 98.1 F (36.7 C), temperature source Oral, height 5\' 3"  (1.6 m), weight 204 lb (92.5 kg), SpO2 99 %. Body mass index is 36.14 kg/m. Physical Exam  Constitutional: She is oriented to person, place, and time. She appears well-developed and well-nourished.  HENT:  Head: Normocephalic.  Eyes: EOM are normal.  Neck: Normal range of motion.  Cardiovascular: Normal rate.  Pulmonary/Chest: Effort normal.  Musculoskeletal: Normal range of motion.    Neurological: She is alert and oriented to person, place, and time.  Skin: Skin is warm and dry.  Psychiatric: She has a normal mood and affect. [name] behavior is normal.  Vitals reviewed.   RECENT LABS AND TESTS: BMET    Component Value Date/Time   NA 143 06/29/2017 1043   K 3.4 (L) 06/29/2017 1043   CL 101 06/29/2017 1043   CO2 27 06/29/2017 1043   GLUCOSE 92 06/29/2017 1043   GLUCOSE 107 (H) 04/13/2017 0858   BUN 10 06/29/2017 1043   CREATININE 0.71 06/29/2017 1043   CREATININE 0.73 09/29/2015 0935   CALCIUM 8.6 (L) 06/29/2017 1043   GFRNONAA 90 06/29/2017 1043   GFRAA 104 06/29/2017 1043   Lab Results  Component Value Date   HGBA1C 5.7 (H) 06/29/2017   HGBA1C 5.8 04/13/2017   HGBA1C 5.9 02/04/2016   HGBA1C  01/09/2009    5.8 (NOTE)   The ADA recommends the following therapeutic goal for glycemic   control related to Hgb A1C measurement:   Goal of Therapy:   < 7.0% Hgb A1C   Reference: American Diabetes Association: Clinical Practice   Recommendations 2008, Diabetes Care,  2008, 31:(Suppl 1).   Lab Results  Component Value Date   INSULIN 13.1 06/29/2017   CBC    Component Value Date/Time   WBC 3.6 06/29/2017 1043   WBC 3.8 (L) 04/13/2017 0858   RBC 4.53 06/29/2017 1043   RBC 4.72 04/13/2017 0858   HGB 13.4 06/29/2017 1043   HCT 39.9 06/29/2017 1043   PLT 265.0 04/13/2017 0858   MCV 88 06/29/2017 1043   MCH 29.6 06/29/2017 1043   MCH 29.1 08/25/2014 1002   MCHC 33.6 06/29/2017 1043   MCHC 33.5 04/13/2017 0858   RDW 14.1 06/29/2017 1043   LYMPHSABS 1.2 06/29/2017 1043   MONOABS 0.4 01/09/2009 0902  EOSABS 0.1 06/29/2017 1043   BASOSABS 0.0 06/29/2017 1043   Iron/TIBC/Ferritin/ %Sat No results found for: IRON, TIBC, FERRITIN, IRONPCTSAT Lipid Panel     Component Value Date/Time   CHOL 186 06/29/2017 1043   CHOL 206 (H) 12/19/2013 0820   TRIG 77 06/29/2017 1043   TRIG 111 12/19/2013 0820   HDL 52 06/29/2017 1043   HDL 70 12/19/2013 0820   CHOLHDL  3.0 08/08/2016 0828   VLDL 17 08/08/2016 0828   LDLCALC 119 (H) 06/29/2017 1043   LDLCALC 114 (H) 12/19/2013 0820   Hepatic Function Panel     Component Value Date/Time   PROT 6.3 06/29/2017 1043   ALBUMIN 4.0 06/29/2017 1043   AST 17 06/29/2017 1043   ALT 16 06/29/2017 1043   ALKPHOS 73 06/29/2017 1043   BILITOT 0.3 06/29/2017 1043   BILIDIR <0.1 01/09/2009 1604   IBILI NOT CALCULATED 01/09/2009 1604      Component Value Date/Time   TSH 0.503 06/29/2017 1043   TSH 0.48 04/13/2017 0858   TSH 0.70 12/29/2014 0923     Ref. Range 06/29/2017 10:43  Vitamin D, 25-Hydroxy Latest Ref Range: 30.0 - 100.0 ng/mL 21.7 (L)   ASSESSMENT AND PLAN: Essential hypertension - Plan: lisinopril (PRINIVIL,ZESTRIL) 10 MG tablet  Vitamin D deficiency - Plan: Vitamin D, Ergocalciferol, (DRISDOL) 50000 units CAPS capsule  Prediabetes - Plan: metFORMIN (GLUCOPHAGE) 500 MG tablet  At risk for heart disease  Class 2 severe obesity with serious comorbidity and body mass index (BMI) of 36.0 to 36.9 in adult, unspecified obesity type ([place])  PLAN: Hypertension We discussed sodium restriction, working on healthy weight loss, and a regular exercise program as the means to achieve improved blood pressure control. [name] agreed with this plan and agreed to follow up as directed. We will continue to monitor [name] blood pressure as well as [name] progress with the above lifestyle modifications. She will continue [name] medications as prescribed and will watch for signs of hypotension as she continues [name] lifestyle modifications.  Vitamin D Deficiency [name] was informed that low vitamin D levels contributes to fatigue and are associated with obesity, breast, and colon cancer. She agrees to continue to take prescription Vit D @50 ,000 IU every week # 4 with no refills and will follow up for routine testing of vitamin D, at least 2-3 times per year. She was informed of the risk of over-replacement of vitamin D and  agrees to not increase [name] dose unless she discusses this with [place] first. [name] agrees to follow up in our clinic in 2 weeks.   Pre-Diabetes [name] will continue to work on weight loss, exercise, and decreasing simple carbohydrates in [name] diet to help decrease the risk of diabetes. We dicussed metformin including benefits and risks. She was informed that eating too many simple carbohydrates or too many calories at one sitting increases the likelihood of GI side effects. She agrees to continue metformin 500 mg qd #30 with no refills.[name] agreed to follow up with [place] as directed to monitor [name] progress.  Cardiovascular risk counseling [name] was given extended (15 minutes) coronary artery disease prevention counseling today. She is 67 y.o. female and has risk factors for heart disease including obesity. We discussed intensive lifestyle modifications today with an emphasis on specific weight loss instructions and strategies. [name] was also informed of the importance of increasing exercise and decreasing saturated fats to help prevent heart disease.  Obesity [name] is currently in the action stage of change. As such, [name]  goal is to continue with weight loss efforts She has agreed to follow the Category 3 plan [name] has been instructed to work up to a goal of 150 minutes of combined cardio and strengthening exercise per week for weight loss and overall health benefits. We discussed the following Behavioral Modification Strategies today: increasing lean protein intake and work on meal planning and easy cooking plans   [name] has agreed to follow up with our clinic in 2 weeks. She was informed of the importance of frequent follow up visits to maximize [name] success with intensive lifestyle modifications for [name] multiple health conditions.    Today's visit was # 12 out of 22.  Starting weight: 221 lbs Starting date: 06/29/17 Today's weight :204 lbs Today's date: 01/17/2018 Total lbs lost  to date: 17 (Patients must lose 7 lbs in the first 6 months to continue with counseling)   ASK: We discussed the diagnosis of obesity with [name] today and Vanisha agreed to give [place] permission to discuss obesity behavioral modification therapy today.  ASSESS: [name] has the diagnosis of obesity and [name] BMI today is 36.14 [name] is in the action stage of change   ADVISE: [name] was educated on the multiple health risks of obesity as well as the benefit of weight loss to improve [name] health. She was advised of the need for long term treatment and the importance of lifestyle modifications.  AGREE: Multiple dietary modification options and treatment options were discussed and  [name] agreed to the above obesity treatment plan.   Leary Roca, am acting as transcriptionist for [employer], PA-C I, [facility], have reviewed this note and agree with its content.

## 2018-01-31 ENCOUNTER — Ambulatory Visit (INDEPENDENT_AMBULATORY_CARE_PROVIDER_SITE_OTHER): Payer: BC Managed Care – PPO | Admitting: Physician Assistant

## 2018-01-31 VITALS — BP 163/83 | HR 60 | Temp 97.7°F | Ht 63.0 in | Wt 206.0 lb

## 2018-01-31 DIAGNOSIS — Z6836 Body mass index (BMI) 36.0-36.9, adult: Secondary | ICD-10-CM | POA: Diagnosis not present

## 2018-01-31 DIAGNOSIS — Z9189 Other specified personal risk factors, not elsewhere classified: Secondary | ICD-10-CM

## 2018-01-31 DIAGNOSIS — E559 Vitamin D deficiency, unspecified: Secondary | ICD-10-CM

## 2018-01-31 DIAGNOSIS — I10 Essential (primary) hypertension: Secondary | ICD-10-CM

## 2018-01-31 MED ORDER — VITAMIN D (ERGOCALCIFEROL) 1.25 MG (50000 UNIT) PO CAPS: 50000.0000 [IU] | ORAL_CAPSULE | ORAL | 0 refills | Status: DC

## 2018-01-31 NOTE — Progress Notes (Addendum)
Office: [phone]  /  Fax: [phone]   HPI:   Chief Complaint: OBESITY [name] is here to discuss her progress with her obesity treatment plan. She is on the Category 3 plan and is following her eating plan approximately 25 % of the time. She states she is exercising 0 minutes 0 times per week. [name] states she has been busier doing chores around the house, and has been snacking more. [name] also had increased family sabotage.She states she will be getting back to the structured meal plan.  Her weight is 206 lb (93.4 kg) today and has had a weight gain of 2 pounds over a period of 2 weeks since her last visit. She has lost 15 lbs since starting treatment with [place].  Vitamin D deficiency [name] has a diagnosis of vitamin D deficiency. She is currently taking vit D and denies nausea, vomiting or muscle weakness.   Ref. Range 06/29/2017 10:43  Vitamin D, 25-Hydroxy Latest Ref Range: 30.0 - 100.0 ng/mL 21.7 (L)   Hypertension [name] is a 67 y.o. female with hypertension. Her blood pressure is elevated today at 163/83. [name] is on amlodipine, carvedilol and lisinopril. States she has not taken her amlodipine for the past 5 days. [name] denies chest pain or shortness of breath on exertion. She is working weight loss to help control her blood pressure with the goal of decreasing her risk of heart attack and stroke. [name] blood pressure is not currently controlled and she declines any adjustment to her BP medications.  At risk for cardiovascular disease [name] is at a higher than average risk for cardiovascular disease due to obesity and hypertension. She currently denies any chest pain.  ALLERGIES: Allergies  Allergen Reactions  . Metoprolol Hives  . Onabotulinumtoxina (Cosmetic) Swelling  . Botox [Botulinum Toxin Type A]   . Hydrochlorothiazide Other (See Comments)    Increases risk of gout flares  . Toprol Xl [Metoprolol Succinate] Hives     MEDICATIONS: Current Outpatient Medications on File Prior to Visit  Medication Sig Dispense Refill  . acetaminophen-codeine (TYLENOL #3) 300-30 MG tablet Take 1-2 tablets by mouth every 8 (eight) hours as needed for moderate pain. 30 tablet 0  . ALPRAZolam (XANAX) 0.25 MG tablet Take 0.25 mg by mouth 2 (two) times daily as needed for sleep.    [name] amLODipine (NORVASC) 10 MG tablet TAKE 1 TABLET BY MOUTH DAILY 30 tablet 6  . aspirin 81 MG tablet Take 81 mg by mouth daily.    . carvedilol (COREG) 3.125 MG tablet Take 1 tablet (3.125 mg total) by mouth 2 (two) times daily. 60 tablet 2  . desvenlafaxine (PRISTIQ) 100 MG 24 hr tablet Take 100 mg by mouth daily.    . diclofenac (FLECTOR) 1.3 % PTCH Place 1 patch onto the skin 2 (two) times daily.    . Diclofenac Potassium (CAMBIA) 50 MG PACK Take by mouth.    . diclofenac sodium (VOLTAREN) 1 % GEL Apply 2 g topically 4 (four) times daily. 100 g 3  . Eszopiclone (ESZOPICLONE) 3 MG TABS Take 3 mg by mouth at bedtime. Take immediately before bedtime    . fluconazole (DIFLUCAN) 150 MG tablet Take 150 mg by mouth daily.    . frovatriptan (FROVA) 2.5 MG tablet Take 2.5 mg by mouth as needed for migraine. If recurs, may repeat after 2 hours. Max of 3 tabs in 24 hours.    . gabapentin (NEURONTIN) 300 MG capsule Take 300 mg by mouth  3 (three) times daily.    [name] ipratropium (ATROVENT) 0.03 % nasal spray Place 2 sprays into the nose 4 (four) times daily. 30 mL 1  . lisinopril (PRINIVIL,ZESTRIL) 10 MG tablet Take 1 tablet (10 mg total) by mouth daily. 30 tablet 0  . metFORMIN (GLUCOPHAGE) 500 MG tablet Take 1 tablet (500 mg total) by mouth daily. with food 30 tablet 0  . nitroGLYCERIN (NITROSTAT) 0.4 MG SL tablet Place 1 tablet (0.4 mg total) under the tongue every 5 (five) minutes as needed. 30 tablet 0  . Olopatadine HCl (PATADAY) 0.2 % SOLN Apply 1 drop daily as needed to eye (for itching). 2.5 mL 9  . polyethylene glycol (MIRALAX / GLYCOLAX) packet Take 17  g by mouth daily.    [name] scopolamine (TRANSDERM-SCOP) 1 MG/3DAYS Place 1 patch (1.5 mg total) onto the skin every 3 (three) days. 5 patch 1  . valACYclovir (VALTREX) 500 MG tablet Take 1 tablet (500 mg total) by mouth 2 (two) times daily. Use for 3 days as needed for outbreak 30 tablet 2  . Vitamin D, Ergocalciferol, (DRISDOL) 50000 units CAPS capsule Take 1 capsule (50,000 Units total) by mouth every 7 (seven) days. 4 capsule 0   No current facility-administered medications on file prior to visit.     PAST MEDICAL HISTORY: Past Medical History:  Diagnosis Date  . Allergy   . Anxiety   . Arthritis   . Back pain   . Constipation   . Depression   . Dyslipidemia   . GERD (gastroesophageal reflux disease)   . Headaches due to old head injury   . History of stomach ulcers   . Hyperlipidemia   . Hypertension   . Lactose intolerance   . Migraines   . Obesity   . Prediabetes   . Reflux   . Thyroid disease     PAST SURGICAL HISTORY: Past Surgical History:  Procedure Laterality Date  . ABDOMINAL HYSTERECTOMY    . BREAST LUMPECTOMY WITH RADIOACTIVE SEED LOCALIZATION Left 08/23/2016   Procedure: LEFT BREAST LUMPECTOMY WITH RADIOACTIVE SEED LOCALIZATION;  Surgeon: Rolm Bookbinder, MD;  Location: [facility];  Service: General;  Laterality: Left;  . BREAST SURGERY  2003   reduction  . CESAREAN SECTION    . FACET JOINT INJECTION  2011 and 2012  . MYOMECTOMY    . NM MYOCAR PERF WALL MOTION  03/2009   dipyridamole; normal pattern of perfusion in all regions, post-stress EF 65%, normal study   . THYROIDECTOMY  1989  . TRANSTHORACIC ECHOCARDIOGRAM  03/2009   EF=>55%, borderline conc LVH; mild mitral annular calcif, trace MR; trace TR; AV mildly sclerotic, mild AV regurg; mild pulm valve regurg   . TUBAL LIGATION      SOCIAL HISTORY: Social History   Tobacco Use  . Smoking status: Never Smoker  . Smokeless tobacco: Never Used  Substance Use Topics  . Alcohol use: Yes      Alcohol/week: 0.5 - 1.0 oz    Types: 1 - 2 Standard drinks or equivalent per week  . Drug use: No    FAMILY HISTORY: Family History  Problem Relation Age of Onset  . Heart disease Mother        MI, HTN  . Hypertension Mother   . Thyroid disease Mother   . Hyperlipidemia Mother   . Sudden death Mother   . Anxiety disorder Mother   . Heart disease Maternal Grandmother        MI, CVA  .  Heart disease Maternal Grandfather        MI  . Stroke Maternal Grandfather   . Heart disease Paternal Grandmother   . Stroke Paternal Grandmother   . Heart disease Paternal Grandfather        MI  . Glaucoma Father   . Diabetes Father   . Depression Daughter   . ADD / ADHD Son   . Thyroid disease Sister   . Heart attack Paternal Uncle        HTN  . Hypertension Paternal Uncle     ROS: Review of Systems  Constitutional: Negative for weight loss.  Respiratory: Negative for shortness of breath (on exertion).   Cardiovascular: Negative for chest pain.  Gastrointestinal: Negative for nausea and vomiting.  Musculoskeletal:       Negative for muscle weakness    PHYSICAL EXAM: Blood pressure (!) 163/83, pulse 60, temperature 97.7 F (36.5 C), temperature source Oral, height 5\' 3"  (1.6 m), weight 206 lb (93.4 kg), SpO2 100 %. Body mass index is 36.49 kg/m. Physical Exam  Constitutional: She is oriented to person, place, and time. She appears well-developed and well-nourished.  Cardiovascular: Normal rate.  Pulmonary/Chest: Effort normal.  Musculoskeletal: Normal range of motion.  Neurological: She is oriented to person, place, and time.  Skin: Skin is warm and dry.  Psychiatric: She has a normal mood and affect. Her behavior is normal.  Vitals reviewed.   RECENT LABS AND TESTS: BMET    Component Value Date/Time   NA 143 06/29/2017 1043   K 3.4 (L) 06/29/2017 1043   CL 101 06/29/2017 1043   CO2 27 06/29/2017 1043   GLUCOSE 92 06/29/2017 1043   GLUCOSE 107 (H) 04/13/2017 0858    BUN 10 06/29/2017 1043   CREATININE 0.71 06/29/2017 1043   CREATININE 0.73 09/29/2015 0935   CALCIUM 8.6 (L) 06/29/2017 1043   GFRNONAA 90 06/29/2017 1043   GFRAA 104 06/29/2017 1043   Lab Results  Component Value Date   HGBA1C 5.7 (H) 06/29/2017   HGBA1C 5.8 04/13/2017   HGBA1C 5.9 02/04/2016   HGBA1C  01/09/2009    5.8 (NOTE)   The ADA recommends the following therapeutic goal for glycemic   control related to Hgb A1C measurement:   Goal of Therapy:   < 7.0% Hgb A1C   Reference: American Diabetes Association: Clinical Practice   Recommendations 2008, Diabetes Care,  2008, 31:(Suppl 1).   Lab Results  Component Value Date   INSULIN 13.1 06/29/2017   CBC    Component Value Date/Time   WBC 3.6 06/29/2017 1043   WBC 3.8 (L) 04/13/2017 0858   RBC 4.53 06/29/2017 1043   RBC 4.72 04/13/2017 0858   HGB 13.4 06/29/2017 1043   HCT 39.9 06/29/2017 1043   PLT 265.0 04/13/2017 0858   MCV 88 06/29/2017 1043   MCH 29.6 06/29/2017 1043   MCH 29.1 08/25/2014 1002   MCHC 33.6 06/29/2017 1043   MCHC 33.5 04/13/2017 0858   RDW 14.1 06/29/2017 1043   LYMPHSABS 1.2 06/29/2017 1043   MONOABS 0.4 01/09/2009 0902   EOSABS 0.1 06/29/2017 1043   BASOSABS 0.0 06/29/2017 1043   Iron/TIBC/Ferritin/ %Sat No results found for: IRON, TIBC, FERRITIN, IRONPCTSAT Lipid Panel     Component Value Date/Time   CHOL 186 06/29/2017 1043   CHOL 206 (H) 12/19/2013 0820   TRIG 77 06/29/2017 1043   TRIG 111 12/19/2013 0820   HDL 52 06/29/2017 1043   HDL 70 12/19/2013 0820   CHOLHDL  3.0 08/08/2016 0828   VLDL 17 08/08/2016 0828   LDLCALC 119 (H) 06/29/2017 1043   LDLCALC 114 (H) 12/19/2013 0820   Hepatic Function Panel     Component Value Date/Time   PROT 6.3 06/29/2017 1043   ALBUMIN 4.0 06/29/2017 1043   AST 17 06/29/2017 1043   ALT 16 06/29/2017 1043   ALKPHOS 73 06/29/2017 1043   BILITOT 0.3 06/29/2017 1043   BILIDIR <0.1 01/09/2009 1604   IBILI NOT CALCULATED 01/09/2009 1604       Component Value Date/Time   TSH 0.503 06/29/2017 1043   TSH 0.48 04/13/2017 0858   TSH 0.70 12/29/2014 0923    Ref. Range 06/29/2017 10:43  Vitamin D, 25-Hydroxy Latest Ref Range: 30.0 - 100.0 ng/mL 21.7 (L)   ASSESSMENT AND PLAN: Vitamin D deficiency - Plan: Vitamin D, Ergocalciferol, (DRISDOL) 50000 units CAPS capsule  Essential hypertension  At risk for heart disease  Class 2 severe obesity with serious comorbidity and body mass index (BMI) of 36.0 to 36.9 in adult, unspecified obesity type (HCC)  PLAN:  Vitamin D Deficiency Keri was informed that low vitamin D levels contributes to fatigue and are associated with obesity, breast, and colon cancer. She agrees to continue to take prescription Vit D @50 ,000 IU every week #4 with no refills and will follow up for routine testing of vitamin D, at least 2-3 times per year. She was informed of the risk of over-replacement of vitamin D and agrees to not increase her dose unless she discusses this with [place] first. Loran agrees to follow up with our clinic in 2 weeks.  Hypertension We discussed sodium restriction, working on healthy weight loss, and a regular exercise program as the means to achieve improved blood pressure control. [name] agreed with this plan and agreed to follow up as directed. We will continue to monitor her blood pressure as well as her progress with the above lifestyle modifications. She will continue her medications as prescribed and will watch for signs of hypotension as she continues her lifestyle modifications. [name] was encouraged to take her medications as prescribed.  Cardiovascular risk counseling [name] was given extended (15 minutes) coronary artery disease prevention counseling today. She is 67 y.o. female and has risk factors for heart disease including obesity and hypertension. We discussed intensive lifestyle modifications today with an emphasis on specific weight loss instructions and strategies.  Pt was also informed of the importance of increasing exercise and decreasing saturated fats to help prevent heart disease.  Obesity [name] is currently in the action stage of change. As such, her goal is to continue with weight loss efforts She has agreed to follow the Category 2 plan [name] has been instructed to work up to a goal of 150 minutes of combined cardio and strengthening exercise per week for weight loss and overall health benefits. We discussed the following Behavioral Modification Strategies today: increasing lean protein intake and dealing with family or coworker sabotage  [name] has agreed to follow up with our clinic in 2 weeks. She was informed of the importance of frequent follow up visits to maximize her success with intensive lifestyle modifications for her multiple health conditions.   OBESITY BEHAVIORAL INTERVENTION VISIT  Today's visit was # 13 out of 22.  Starting weight: 221 lbs Starting date: 06/29/17 Today's weight : 206 lbs  Today's date: 01/31/2018 Total lbs lost to date: 15 (Patients must lose 7 lbs in the first 6 months to continue with counseling)   ASK: We discussed  the diagnosis of obesity with [name] today and [name] agreed to give [place] permission to discuss obesity behavioral modification therapy today.  ASSESS: [name] has the diagnosis of obesity and her BMI today is 36.5 [name] is in the action stage of change   ADVISE: [name] was educated on the multiple health risks of obesity as well as the benefit of weight loss to improve her health. She was advised of the need for long term treatment and the importance of lifestyle modifications.  AGREE: Multiple dietary modification options and treatment options were discussed and  [name] agreed to the above obesity treatment plan.   Corey Skains, am acting as transcriptionist for [employer], PA-C I, Lacy Duverney [facility], have reviewed this note and agree with its  content.

## 2018-02-02 ENCOUNTER — Ambulatory Visit (AMBULATORY_SURGERY_CENTER): Payer: Self-pay | Admitting: *Deleted

## 2018-02-02 ENCOUNTER — Other Ambulatory Visit: Payer: Self-pay

## 2018-02-02 VITALS — Ht 63.0 in | Wt 211.0 lb

## 2018-02-02 DIAGNOSIS — Z1211 Encounter for screening for malignant neoplasm of colon: Secondary | ICD-10-CM

## 2018-02-02 MED ORDER — PEG 3350-KCL-NA BICARB-NACL 420 G PO SOLR: 4000.0000 mL | Freq: Once | ORAL | 0 refills | Status: AC

## 2018-02-02 NOTE — Progress Notes (Signed)
Patient denies any allergies to eggs or soy. Patient denies any problems with anesthesia/sedation. Patient denies any oxygen use at home. Patient denies taking any diet/weight loss medications or blood thinners. EMMI education assisgned to patient on colonoscopy, this was explained and instructions given to patient. 

## 2018-02-04 LAB — LIPID PANEL
Chol/HDL Ratio: 3.5 ratio (ref 0.0–4.4)
Cholesterol, Total: 183 mg/dL (ref 100–199)
HDL: 52 mg/dL (ref >39)
LDL Calculated: 118 mg/dL — ABNORMAL HIGH (ref 0–99)
TRIGLYCERIDES: 67 mg/dL (ref 0–149)
VLDL Cholesterol Cal: 13 mg/dL (ref 5–40)

## 2018-02-05 ENCOUNTER — Encounter: Payer: Self-pay | Admitting: Gastroenterology

## 2018-02-14 ENCOUNTER — Ambulatory Visit (INDEPENDENT_AMBULATORY_CARE_PROVIDER_SITE_OTHER): Payer: BC Managed Care – PPO | Admitting: Physician Assistant

## 2018-02-14 VITALS — BP 130/77 | HR 59 | Temp 98.1°F | Ht 63.0 in | Wt 203.0 lb

## 2018-02-14 DIAGNOSIS — I10 Essential (primary) hypertension: Secondary | ICD-10-CM

## 2018-02-14 DIAGNOSIS — Z6836 Body mass index (BMI) 36.0-36.9, adult: Secondary | ICD-10-CM | POA: Diagnosis not present

## 2018-02-14 NOTE — Progress Notes (Signed)
Office: [phone]  /  Fax: [phone]   HPI:   Chief Complaint: OBESITY [name] is here to discuss her progress with her obesity treatment plan. She is on the Category 2 plan and is following her eating plan approximately 50 % of the time. She states she is walking for 30 minutes 4 times per week. Minta continues to do well with weight loss. She is more mindful of her eating and has been following the plan closely. She states she skips some of her lunch meals on occasions.  Her weight is 203 lb (92.1 kg) today and has had a weight loss of 3 pounds over a period of 2 weeks since her last visit. She has lost 18 lbs since starting treatment with [place].  Hypertension [name] is a 67 y.o. female with hypertension. [name]'s blood pressure is stable and she denies chest pain or shortness of breath. She is working weight loss to help control her blood pressure with the goal of decreasing her risk of heart attack and stroke. [name]'s blood pressure is currently controlled.  ALLERGIES: Allergies  Allergen Reactions  . Metoprolol Hives  . Onabotulinumtoxina (Cosmetic) Swelling  . Botox [Botulinum Toxin Type A]   . Hydrochlorothiazide Other (See Comments)    Increases risk of gout flares  . Toprol Xl [Metoprolol Succinate] Hives    MEDICATIONS: Current Outpatient Medications on File Prior to Visit  Medication Sig Dispense Refill  . acetaminophen-codeine (TYLENOL #3) 300-30 MG tablet Take 1-2 tablets by mouth every 8 (eight) hours as needed for moderate pain. 30 tablet 0  . ALPRAZolam (XANAX) 0.25 MG tablet Take 0.25 mg by mouth 2 (two) times daily as needed for sleep.    Marland Kitchen amLODipine (NORVASC) 10 MG tablet TAKE 1 TABLET BY MOUTH DAILY 30 tablet 6  . aspirin 81 MG tablet Take 81 mg by mouth daily.    . carvedilol (COREG) 3.125 MG tablet Take 1 tablet (3.125 mg total) by mouth 2 (two) times daily. 60 tablet 2  . desvenlafaxine (PRISTIQ) 100 MG 24 hr tablet Take 100 mg  by mouth daily.    . diclofenac (FLECTOR) 1.3 % PTCH Place 1 patch onto the skin 2 (two) times daily.    . Diclofenac Potassium (CAMBIA) 50 MG PACK Take by mouth.    . diclofenac sodium (VOLTAREN) 1 % GEL Apply 2 g topically 4 (four) times daily. 100 g 3  . Eszopiclone (ESZOPICLONE) 3 MG TABS Take 3 mg by mouth at bedtime. Take immediately before bedtime    . fluconazole (DIFLUCAN) 150 MG tablet Take 150 mg by mouth daily.    . frovatriptan (FROVA) 2.5 MG tablet Take 2.5 mg by mouth as needed for migraine. If recurs, may repeat after 2 hours. Max of 3 tabs in 24 hours.    . gabapentin (NEURONTIN) 300 MG capsule Take 300 mg by mouth 3 (three) times daily.    Marland Kitchen ipratropium (ATROVENT) 0.03 % nasal spray Place 2 sprays into the nose 4 (four) times daily. 30 mL 1  . lisinopril (PRINIVIL,ZESTRIL) 10 MG tablet Take 1 tablet (10 mg total) by mouth daily. 30 tablet 0  . metFORMIN (GLUCOPHAGE) 500 MG tablet Take 1 tablet (500 mg total) by mouth daily. with food 30 tablet 0  . nitroGLYCERIN (NITROSTAT) 0.4 MG SL tablet Place 1 tablet (0.4 mg total) under the tongue every 5 (five) minutes as needed. 30 tablet 0  . Olopatadine HCl (PATADAY) 0.2 % SOLN Apply 1 drop daily as needed  to eye (for itching). 2.5 mL 9  . polyethylene glycol (MIRALAX / GLYCOLAX) packet Take 17 g by mouth daily.    Marland Kitchen scopolamine (TRANSDERM-SCOP) 1 MG/3DAYS Place 1 patch (1.5 mg total) onto the skin every 3 (three) days. 5 patch 1  . valACYclovir (VALTREX) 500 MG tablet Take 1 tablet (500 mg total) by mouth 2 (two) times daily. Use for 3 days as needed for outbreak 30 tablet 2  . Vitamin D, Ergocalciferol, (DRISDOL) 50000 units CAPS capsule Take 1 capsule (50,000 Units total) by mouth every 7 (seven) days. 4 capsule 0   No current facility-administered medications on file prior to visit.     PAST MEDICAL HISTORY: Past Medical History:  Diagnosis Date  . Allergy   . Anxiety   . Arthritis   . Asthma   . Back pain   . Blood  transfusion without reported diagnosis   . Constipation   . Depression   . Diabetes mellitus without complication ([place])   . Dyslipidemia   . GERD (gastroesophageal reflux disease)   . Headaches due to old head injury   . History of stomach ulcers   . Hyperlipidemia   . Hypertension   . Lactose intolerance   . Migraines   . Obesity   . Prediabetes   . Reflux   . Thyroid disease     PAST SURGICAL HISTORY: Past Surgical History:  Procedure Laterality Date  . ABDOMINAL HYSTERECTOMY    . BREAST LUMPECTOMY WITH RADIOACTIVE SEED LOCALIZATION Left 08/23/2016   Procedure: LEFT BREAST LUMPECTOMY WITH RADIOACTIVE SEED LOCALIZATION;  Surgeon: Rolm Bookbinder, MD;  Location: [facility];  Service: General;  Laterality: Left;  . BREAST SURGERY  2003   reduction  . CESAREAN SECTION    . COLONOSCOPY  2008  . FACET JOINT INJECTION  2011 and 2012  . MYOMECTOMY    . NM MYOCAR PERF WALL MOTION  03/2009   dipyridamole; normal pattern of perfusion in all regions, post-stress EF 65%, normal study   . THYROIDECTOMY  1989  . TRANSTHORACIC ECHOCARDIOGRAM  03/2009   EF=>55%, borderline conc LVH; mild mitral annular calcif, trace MR; trace TR; AV mildly sclerotic, mild AV regurg; mild pulm valve regurg   . TUBAL LIGATION      SOCIAL HISTORY: Social History   Tobacco Use  . Smoking status: Never Smoker  . Smokeless tobacco: Never Used  Substance Use Topics  . Alcohol use: Yes    Comment: occ. beer  . Drug use: No    FAMILY HISTORY: Family History  Problem Relation Age of Onset  . Heart disease Mother        MI, HTN  . Hypertension Mother   . Thyroid disease Mother   . Hyperlipidemia Mother   . Sudden death Mother   . Anxiety disorder Mother   . Heart disease Maternal Grandmother        MI, CVA  . Heart disease Maternal Grandfather        MI  . Stroke Maternal Grandfather   . Heart disease Paternal Grandmother   . Stroke Paternal Grandmother   . Heart disease  Paternal Grandfather        MI  . Glaucoma Father   . Diabetes Father   . Depression Daughter   . ADD / ADHD Son   . Thyroid disease Sister   . Heart attack Paternal Uncle        HTN  . Hypertension Paternal Uncle   . Colon  cancer Neg Hx   . Stomach cancer Neg Hx   . Esophageal cancer Neg Hx     ROS: Review of Systems  Constitutional: Positive for weight loss.  Respiratory: Negative for shortness of breath.   Cardiovascular: Negative for chest pain.    PHYSICAL EXAM: Blood pressure 130/77, pulse (!) 59, temperature 98.1 F (36.7 C), temperature source Oral, height 5\' 3"  (1.6 m), weight 203 lb (92.1 kg), SpO2 98 %. Body mass index is 35.96 kg/m. Physical Exam  Constitutional: She is oriented to person, place, and time. She appears well-developed and well-nourished.  Cardiovascular: Bradycardia present.  Pulmonary/Chest: Effort normal.  Musculoskeletal: Normal range of motion.  Neurological: She is oriented to person, place, and time.  Skin: Skin is warm and dry.  Psychiatric: She has a normal mood and affect. Her behavior is normal.  Vitals reviewed.   RECENT LABS AND TESTS: BMET    Component Value Date/Time   NA 143 06/29/2017 1043   K 3.4 (L) 06/29/2017 1043   CL 101 06/29/2017 1043   CO2 27 06/29/2017 1043   GLUCOSE 92 06/29/2017 1043   GLUCOSE 107 (H) 04/13/2017 0858   BUN 10 06/29/2017 1043   CREATININE 0.71 06/29/2017 1043   CREATININE 0.73 09/29/2015 0935   CALCIUM 8.6 (L) 06/29/2017 1043   GFRNONAA 90 06/29/2017 1043   GFRAA 104 06/29/2017 1043   Lab Results  Component Value Date   HGBA1C 5.7 (H) 06/29/2017   HGBA1C 5.8 04/13/2017   HGBA1C 5.9 02/04/2016   HGBA1C  01/09/2009    5.8 (NOTE)   The ADA recommends the following therapeutic goal for glycemic   control related to Hgb A1C measurement:   Goal of Therapy:   < 7.0% Hgb A1C   Reference: American Diabetes Association: Clinical Practice   Recommendations 2008, Diabetes Care,  2008, 31:(Suppl  1).   Lab Results  Component Value Date   INSULIN 13.1 06/29/2017   CBC    Component Value Date/Time   WBC 3.6 06/29/2017 1043   WBC 3.8 (L) 04/13/2017 0858   RBC 4.53 06/29/2017 1043   RBC 4.72 04/13/2017 0858   HGB 13.4 06/29/2017 1043   HCT 39.9 06/29/2017 1043   PLT 265.0 04/13/2017 0858   MCV 88 06/29/2017 1043   MCH 29.6 06/29/2017 1043   MCH 29.1 08/25/2014 1002   MCHC 33.6 06/29/2017 1043   MCHC 33.5 04/13/2017 0858   RDW 14.1 06/29/2017 1043   LYMPHSABS 1.2 06/29/2017 1043   MONOABS 0.4 01/09/2009 0902   EOSABS 0.1 06/29/2017 1043   BASOSABS 0.0 06/29/2017 1043   Iron/TIBC/Ferritin/ %Sat No results found for: IRON, TIBC, FERRITIN, IRONPCTSAT Lipid Panel     Component Value Date/Time   CHOL 183 02/03/2018 0935   CHOL 206 (H) 12/19/2013 0820   TRIG 67 02/03/2018 0935   TRIG 111 12/19/2013 0820   HDL 52 02/03/2018 0935   HDL 70 12/19/2013 0820   CHOLHDL 3.5 02/03/2018 0935   CHOLHDL 3.0 08/08/2016 0828   VLDL 17 08/08/2016 0828   LDLCALC 118 (H) 02/03/2018 0935   LDLCALC 114 (H) 12/19/2013 0820   Hepatic Function Panel     Component Value Date/Time   PROT 6.3 06/29/2017 1043   ALBUMIN 4.0 06/29/2017 1043   AST 17 06/29/2017 1043   ALT 16 06/29/2017 1043   ALKPHOS 73 06/29/2017 1043   BILITOT 0.3 06/29/2017 1043   BILIDIR <0.1 01/09/2009 1604   IBILI NOT CALCULATED 01/09/2009 1604      Component  Value Date/Time   TSH 0.503 06/29/2017 1043   TSH 0.48 04/13/2017 0858   TSH 0.70 12/29/2014 0923    ASSESSMENT AND PLAN: Essential hypertension  Class 2 severe obesity with serious comorbidity and body mass index (BMI) of 36.0 to 36.9 in adult, unspecified obesity type ([place])  PLAN:  Hypertension We discussed sodium restriction, working on healthy weight loss, and a regular exercise program as the means to achieve improved blood pressure control. [place] agreed with this plan and agreed to follow up as directed. We will continue to monitor her blood  pressure as well as her progress with the above lifestyle modifications. She will continue her medications as prescribed and will watch for signs of hypotension as she continues her lifestyle modifications. [name] agrees to follow up with our clinic in 2 weeks.  We spent > than 50% of the 15 minute visit on the counseling as documented in the note.  Obesity [name] is currently in the action stage of change. As such, her goal is to continue with weight loss efforts She has agreed to follow the Category 2 plan [name] has been instructed to work up to a goal of 150 minutes of combined cardio and strengthening exercise per week for weight loss and overall health benefits. We discussed the following Behavioral Modification Strategies today: No skipping meals   [name] has agreed to follow up with our clinic in 2 weeks. She was informed of the importance of frequent follow up visits to maximize her success with intensive lifestyle modifications for her multiple health conditions.   OBESITY BEHAVIORAL INTERVENTION VISIT  Today's visit was # 14 out of 22.  Starting weight: 221 lbs Starting date: 06/29/17 Today's weight : 203 lbs Today's date: 02/14/2018 Total lbs lost to date: 37 (Patients must lose 7 lbs in the first 6 months to continue with counseling)   ASK: We discussed the diagnosis of obesity with [name] today and [name] agreed to give [place] permission to discuss obesity behavioral modification therapy today.  ASSESS: [name] has the diagnosis of obesity and her BMI today is 35.97 [name] is in the action stage of change   ADVISE: [name] was educated on the multiple health risks of obesity as well as the benefit of weight loss to improve her health. She was advised of the need for long term treatment and the importance of lifestyle modifications.  AGREE: Multiple dietary modification options and treatment options were discussed and  [name] agreed to the  above obesity treatment plan.   [name], am acting as transcriptionist for Lacy Duverney, PA-C I, Lacy Duverney [facility], have reviewed this note and agree with its content

## 2018-02-16 ENCOUNTER — Ambulatory Visit (AMBULATORY_SURGERY_CENTER): Payer: BC Managed Care – PPO | Admitting: Gastroenterology

## 2018-02-16 ENCOUNTER — Encounter: Payer: Self-pay | Admitting: Gastroenterology

## 2018-02-16 ENCOUNTER — Other Ambulatory Visit: Payer: Self-pay

## 2018-02-16 VITALS — BP 136/76 | HR 49 | Temp 97.7°F | Resp 12 | Ht 63.0 in | Wt 211.0 lb

## 2018-02-16 DIAGNOSIS — D122 Benign neoplasm of ascending colon: Secondary | ICD-10-CM | POA: Diagnosis not present

## 2018-02-16 DIAGNOSIS — D125 Benign neoplasm of sigmoid colon: Secondary | ICD-10-CM

## 2018-02-16 DIAGNOSIS — C187 Malignant neoplasm of sigmoid colon: Secondary | ICD-10-CM

## 2018-02-16 DIAGNOSIS — K635 Polyp of colon: Secondary | ICD-10-CM

## 2018-02-16 DIAGNOSIS — D123 Benign neoplasm of transverse colon: Secondary | ICD-10-CM | POA: Diagnosis not present

## 2018-02-16 DIAGNOSIS — Z1211 Encounter for screening for malignant neoplasm of colon: Secondary | ICD-10-CM | POA: Diagnosis not present

## 2018-02-16 HISTORY — PX: COLONOSCOPY W/ POLYPECTOMY: SHX1380

## 2018-02-16 MED ORDER — SODIUM CHLORIDE 0.9 % IV SOLN: 500.0000 mL | Freq: Once | INTRAVENOUS | Status: DC

## 2018-02-16 NOTE — Patient Instructions (Signed)
YOU HAD AN ENDOSCOPIC PROCEDURE TODAY AT THE [facility]:   Refer to the procedure report that was given to you for any specific questions about what was found during the examination.  If the procedure report does not answer your questions, please call your gastroenterologist to clarify.  If you requested that your care partner not be given the details of your procedure findings, then the procedure report has been included in a sealed envelope for you to review at your convenience later.  YOU SHOULD EXPECT: Some feelings of bloating in the abdomen. Passage of more gas than usual.  Walking can help get rid of the air that was put into your GI tract during the procedure and reduce the bloating. If you had a lower endoscopy (such as a colonoscopy or flexible sigmoidoscopy) you may notice spotting of blood in your stool or on the toilet paper. If you underwent a bowel prep for your procedure, you may not have a normal bowel movement for a few days.  Please Note:  You might notice some irritation and congestion in your nose or some drainage.  This is from the oxygen used during your procedure.  There is no need for concern and it should clear up in a day or so.  SYMPTOMS TO REPORT IMMEDIATELY:   Following lower endoscopy (colonoscopy or flexible sigmoidoscopy):  Excessive amounts of blood in the stool  Significant tenderness or worsening of abdominal pains  Swelling of the abdomen that is new, acute  Fever of 100F or higher   For urgent or emergent issues, a gastroenterologist can be reached at any hour by calling [phone].   DIET:  We do recommend a small meal at first, but then you may proceed to your regular diet.  Drink plenty of fluids but you should avoid alcoholic beverages for 24 hours.  ACTIVITY:  You should plan to take it easy for the rest of today and you should NOT DRIVE or use heavy machinery until tomorrow (because of the sedation medicines used during the test).     FOLLOW UP: Our staff will call the number listed on your records the next business day following your procedure to check on you and address any questions or concerns that you may have regarding the information given to you following your procedure. If we do not reach you, we will leave a message.  However, if you are feeling well and you are not experiencing any problems, there is no need to return our call.  We will assume that you have returned to your regular daily activities without incident.  If any biopsies were taken you will be contacted by phone or by letter within the next 1-3 weeks.  Please call us at [phone] if you have not heard about the biopsies in 3 weeks.    SIGNATURES/CONFIDENTIALITY: You and/or your care partner have signed paperwork which will be entered into your electronic medical record.  These signatures attest to the fact that that the information above on your After Visit Summary has been reviewed and is understood.  Full responsibility of the confidentiality of this discharge information lies with you and/or your care-partner.  Read all of the handouts given to you by your recovery room nurse. 

## 2018-02-16 NOTE — Progress Notes (Signed)
Called to room to assist during endoscopic procedure.  Patient ID and intended procedure confirmed with present staff. Received instructions for my participation in the procedure from the performing physician.  

## 2018-02-16 NOTE — Progress Notes (Signed)
Pt's states no medical or surgical changes since previsit or office visit. 

## 2018-02-16 NOTE — Progress Notes (Signed)
Report to PACU, RN, vss, BBS= Clear.  

## 2018-02-16 NOTE — Op Note (Signed)
[place] Patient Name: [name] Procedure Date: 02/16/2018 9:01 AM MRN: [id number] Endoscopist: Milus Banister , MD Age: 66 Referring MD:  Date of Birth: 08/16/1951 Gender: Female Account #: [id number] Procedure:                Colonoscopy Indications:              Screening for colorectal malignant neoplasm;                            colonosocpy 2008 was normal. Medicines:                Monitored Anesthesia Care Procedure:                Pre-Anesthesia Assessment:                           - Prior to the procedure, a History and Physical                            was performed, and patient medications and                            allergies were reviewed. The patient's tolerance of                            previous anesthesia was also reviewed. The risks                            and benefits of the procedure and the sedation                            options and risks were discussed with the patient.                            All questions were answered, and informed consent                            was obtained. Prior Anticoagulants: The patient has                            taken no previous anticoagulant or antiplatelet                            agents. ASA Grade Assessment: II - A patient with                            mild systemic disease. After reviewing the risks                            and benefits, the patient was deemed in                            satisfactory condition to undergo the procedure.  After obtaining informed consent, the colonoscope                            was passed under direct vision. Throughout the                            procedure, the patient's blood pressure, pulse, and                            oxygen saturations were monitored continuously. The                            Colonoscope was introduced through the anus and                            advanced to the the cecum,  identified by                            appendiceal orifice and ileocecal valve. The                            colonoscopy was performed without difficulty. The                            patient tolerated the procedure well. The quality                            of the bowel preparation was good. The ileocecal                            valve, appendiceal orifice, and rectum were                            photographed. Scope In: 9:04:22 AM Scope Out: 9:28:27 AM Scope Withdrawal Time: 0 hours 21 minutes 18 seconds  Total Procedure Duration: 0 hours 24 minutes 5 seconds  Findings:                 Two sessile polyps were found in the transverse                            colon and ascending colon. The polyps were 2 to 4                            mm in size. These polyps were removed with a cold                            snare. Resection and retrieval were complete. (jar                            1)                           A 17 mm polyp was found in the sigmoid colon (about  30cm from anal verge). The polyp was                            semi-pedunculated. The polyp was removed with a hot                            snare. Resection and retrieval were complete. Area                            was tattooed with an injection of [place] ink. (jar 2)                           The exam was otherwise without abnormality on                            direct and retroflexion views. Complications:            No immediate complications. Estimated blood loss:                            None. Estimated Blood Loss:     Estimated blood loss: none. Impression:               - Two 2 to 4 mm polyps in the transverse colon and                            in the ascending colon, removed with a cold snare.                            Resected and retrieved.                           - One 17 mm polyp in the sigmoid colon, removed                            with a hot snare.  Resected and retrieved. The site                            was labeled with submucosal injection of [place] Ink.                           - The examination was otherwise normal on direct                            and retroflexion views. Recommendation:           - Patient has a contact number available for                            emergencies. The signs and symptoms of potential                            delayed complications were discussed with the  patient. Return to normal activities tomorrow.                            Written discharge instructions were provided to the                            patient.                           - Resume previous diet.                           - Continue present medications.                           - Repeat colonoscopy is recommended. The                            colonoscopy date will be determined after pathology                            results from today's exam become available for                            review. Milus Banister, MD 02/16/2018 9:32:52 AM This report has been signed electronically.

## 2018-02-19 ENCOUNTER — Telehealth: Payer: Self-pay

## 2018-02-19 ENCOUNTER — Telehealth: Payer: Self-pay | Admitting: *Deleted

## 2018-02-19 NOTE — Telephone Encounter (Signed)
Left message

## 2018-02-19 NOTE — Telephone Encounter (Signed)
No answer for post procedure call back. Will attempt to call back later this afternoon. SM 

## 2018-02-21 ENCOUNTER — Other Ambulatory Visit: Payer: Self-pay

## 2018-02-21 DIAGNOSIS — C189 Malignant neoplasm of colon, unspecified: Secondary | ICD-10-CM

## 2018-02-22 ENCOUNTER — Other Ambulatory Visit (INDEPENDENT_AMBULATORY_CARE_PROVIDER_SITE_OTHER): Payer: BC Managed Care – PPO

## 2018-02-22 DIAGNOSIS — C189 Malignant neoplasm of colon, unspecified: Secondary | ICD-10-CM

## 2018-02-22 LAB — CBC WITH DIFFERENTIAL/PLATELET
BASOS PCT: 0.5 % (ref 0.0–3.0)
Basophils Absolute: 0 10*3/uL (ref 0.0–0.1)
EOS PCT: 2.3 % (ref 0.0–5.0)
Eosinophils Absolute: 0.1 10*3/uL (ref 0.0–0.7)
HEMATOCRIT: 41.8 % (ref 36.0–46.0)
Hemoglobin: 14.1 g/dL (ref 12.0–15.0)
LYMPHS PCT: 34.9 % (ref 12.0–46.0)
Lymphs Abs: 1.9 10*3/uL (ref 0.7–4.0)
MCHC: 33.8 g/dL (ref 30.0–36.0)
MCV: 87.4 fl (ref 78.0–100.0)
Monocytes Absolute: 0.6 10*3/uL (ref 0.1–1.0)
Monocytes Relative: 10.7 % (ref 3.0–12.0)
NEUTROS ABS: 2.8 10*3/uL (ref 1.4–7.7)
Neutrophils Relative %: 51.6 % (ref 43.0–77.0)
PLATELETS: 285 10*3/uL (ref 150.0–400.0)
RBC: 4.78 Mil/uL (ref 3.87–5.11)
RDW: 14 % (ref 11.5–15.5)
WBC: 5.4 10*3/uL (ref 4.0–10.5)

## 2018-02-22 LAB — COMPREHENSIVE METABOLIC PANEL
ALT: 15 U/L (ref 0–35)
AST: 15 U/L (ref 0–37)
Albumin: 3.8 g/dL (ref 3.5–5.2)
Alkaline Phosphatase: 70 U/L (ref 39–117)
BUN: 18 mg/dL (ref 6–23)
CALCIUM: 9.7 mg/dL (ref 8.4–10.5)
CHLORIDE: 102 meq/L (ref 96–112)
CO2: 32 meq/L (ref 19–32)
Creatinine, Ser: 0.73 mg/dL (ref 0.40–1.20)
GFR: 102.79 mL/min (ref >60.00)
Glucose, Bld: 109 mg/dL — ABNORMAL HIGH (ref 70–99)
POTASSIUM: 3.4 meq/L — AB (ref 3.5–5.1)
Sodium: 140 mEq/L (ref 135–145)
Total Bilirubin: 0.5 mg/dL (ref 0.2–1.2)
Total Protein: 7.3 g/dL (ref 6.0–8.3)

## 2018-02-23 ENCOUNTER — Ambulatory Visit: Payer: Self-pay | Admitting: Internal Medicine

## 2018-02-23 ENCOUNTER — Ambulatory Visit (INDEPENDENT_AMBULATORY_CARE_PROVIDER_SITE_OTHER)
Admission: RE | Admit: 2018-02-23 | Discharge: 2018-02-23 | Disposition: A | Discharge status: Home / Self Care | Payer: BC Managed Care – PPO | Source: Ambulatory Visit | Attending: Gastroenterology | Admitting: Gastroenterology

## 2018-02-23 DIAGNOSIS — C189 Malignant neoplasm of colon, unspecified: Secondary | ICD-10-CM

## 2018-02-23 LAB — CEA

## 2018-02-23 MED ORDER — IOPAMIDOL (ISOVUE-300) INJECTION 61%
100.0000 mL | Freq: Once | INTRAVENOUS | Status: AC | PRN
  Administered 2018-02-23: 100 mL via INTRAVENOUS

## 2018-02-26 ENCOUNTER — Ambulatory Visit: Payer: Self-pay | Admitting: Surgery

## 2018-02-26 ENCOUNTER — Encounter: Payer: Self-pay | Admitting: Family Medicine

## 2018-02-26 NOTE — H&P (Signed)
CC: Referral by Dr. Ardis Hughs for newly diagnosed colon cancer HPI: [name] is a pleasant 67yoF with hx of HTN, HLD, DM underwent a screening colonoscopy by Dr. Ardis Hughs 02/16/18 which showed: 2, 2-4 mm polyps in the transverse and ascending colon-tubular adenomas removed A 17 mm polyp in the sigmoid colon removed with hot snare and tattooed-estimated to be 30 cm from anal verge She subsequently underwent staging CT chest/abdomen/pelvis which did not reveal any evidence of metastatic disease. Her CEA came back <0.5 In retrospect, for the past couple of months she has noted intermittent bright red blood in the toilet bowl that she attributed to history of hemorrhoids. Her last colonoscopy prior to this one was in 2008 and she was told it was normal and have a repeat colonoscopy in 10 years She denies any complaints today. She denies abdominal pain, fever, chills, nausea, vomiting, or diarrhea.  PMH: Hypertension, hyperlipidemia, diabetes (well-controlled on oral hypoglycemics) PSH: C-section via Pfannenstiel; myomectomy 2; hysterectomy via low midline incision; thyroid surgery for nodule she was told was benign; breast surgery (benign ducts on path) FHx: Denies FHx of malignancy Social: Denies use of tobacco/drugs; social EtOH use; she reports being retired. ROS: A comprehensive 10 system review of systems was completed with the patient and pertinent findings as noted above.  The patient is a 67 year old female.   Allergies ([name], CMA; 02/26/2018 1:45 PM) Botulinum Toxin Type A (Cosm) *DERMATOLOGICALS*  Allergies Reconciled   Medication History ([name], CMA; 02/26/2018 1:45 PM) Vitamin D (Ergocalciferol) (50000UNIT Capsule, Oral) Active. MetFORMIN HCl (500MG  Tablet, Oral) Active. Lisinopril (10MG  Tablet, Oral) Active. Desvenlafaxine Succinate ER (100MG  Tablet ER 24HR, Oral) Active. Fluconazole (150MG  Tablet, Oral) Active. Diclofenac Sodium (1% Gel,  Transdermal) Active. Carvedilol (3.125MG  Tablet, Oral) Active. AmLODIPine Besylate (10MG  Tablet, Oral) Active. ALPRAZolam (0.25MG  Tablet, Oral) Active. Valtrex (1GM Tablet, Oral) Active. Nitroglycerin (0.4MG  Tab Sublingual, Sublingual) Active. Atrovent (0.03% Solution, Nasal) Active. Valsartan (320MG  Tablet, Oral) Active. Aspirin (81MG  Tablet, Oral) Active. Gabapentin (300MG  Tablet, Oral) Active. PriLOSEC (40MG  Capsule DR, Oral) Active. Medications Reconciled    Review of Systems Shelby Gave M. Geoffery Aultman MD; 02/26/2018 2:20 PM) General Not Present- Appetite Loss, Chills, Fatigue, Fever, Weight Gain and Weight Loss. Skin Not Present- Brittle Nails and Bruising. HEENT Not Present- Blurred Vision and Headache. Neck Not Present- Neck Pain and Neck Stiffness. Respiratory Not Present- Chronic Cough and Difficulty Breathing on Exertion. Cardiovascular Not Present- Chest Pain. Gastrointestinal Not Present- Abdominal Mass, Abdominal Pain, Nausea and Vomiting. Musculoskeletal Not Present- Back Pain and Claudication. Neurological Not Present- Auras and Stroke. Psychiatric Not Present- Anxiety and Depression. Hematology Not Present- Abnormal Bleeding and Blood Clots.  Vitals ([name] CMA; 02/26/2018 1:46 PM) 02/26/2018 1:45 PM Weight: 206.5 lb Height: 63in Body Surface Area: 1.96 m Body Mass Index: 36.58 kg/m  Temp.: 98.71F(Oral)  Pulse: 84 (Regular)  BP: 150/100 (Sitting, Right Arm, Standard)       Physical Exam Shelby Gave M. Jarrad Mclees MD; 02/26/2018 2:21 PM) The physical exam findings are as follows: Note:Constitutional: No acute distress; conversant; no deformities Eyes: Moist conjunctiva; no lid lag; anicteric sclerae; pupils equal round and reactive to light Neck: Trachea midline; no palpable thyromegaly; neck incision consistent with stated thyroid surgery hx Lungs: Normal respiratory effort; no tactile fremitus CV: Regular rate and rhythm; no palpable  thrill; no pitting edema GI: Abdomen soft, nontender, nondistended; no palpable hepatosplenomegaly; low midline scar MSK: Normal gait; no clubbing/cyanosis Psychiatric: Appropriate affect; alert and oriented 3 Lymphatic: No palpable cervical or axillary  lymphadenopathy    Assessment & Plan Shelby Gave M. 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 is a pleasant 67yoF with hx of HTN, HLD, DM here today for evaluation of invasive cancer found on polpyectomy specimen of 1.7cm polyp removed from sigmoid colon (~30cm from anal verge, tattoo'd). CEA <0.5. Staging CT C/A/P revealed no evidence of metastatic disease -I discussed the case with the reading pathologist, Dr. Vic Ripper and there was extensive tumor involvement of the specimen beneath the surface, extending to the margin of the resection. -The anatomy and physiology of the GI tract was discussed at length with the patient. The pathophysiology of colon polyps and cancer was discussed at length with associated pictures and illustrations. -We discussed that although this was removed there was tumor extending to the edge of the resection and there was no muscle seen by the reading pathologist, suggesting that this likely is involving at least the entire submucosa and possibly extending further. but The actual depth of assessment cannot be clearly made given that no muscularis was seen. Therefore, I have recommended partial colectomy to further address. -Will plan laparoscopic vs open segmental (likely sigmoid) colectomy; flex sig; all other indicated procedures -We discussed the technical approach the procedure, namely that intraoperative assessment of the location of the lesion would be made based over the tattoo is the operation tailored to this. -The planned procedure, material risks (including, but not limited to, pain, bleeding, infection, scarring, need for blood transfusion, damage to surrounding  structures- blood vessels/nerves/viscus/organs, damage to ureter, urine leak, leak from anastomosis, need for additional procedures, need for stoma which may be permanent, hernia, recurrence, pneumonia, heart attack, stroke, death) benefits and alternatives to surgery were discussed at length. The patient's questions were answered to her satisfaction, she voiced understanding and she elected to proceed with surgery. -66mins spent face-to-f  Signed electronically by Ileana Roup, MD (02/26/2018 2:29 PM)

## 2018-03-01 ENCOUNTER — Ambulatory Visit (INDEPENDENT_AMBULATORY_CARE_PROVIDER_SITE_OTHER): Payer: BC Managed Care – PPO | Admitting: Physician Assistant

## 2018-03-01 VITALS — BP 116/74 | HR 68 | Temp 97.8°F | Ht 63.0 in | Wt 203.0 lb

## 2018-03-01 DIAGNOSIS — Z6836 Body mass index (BMI) 36.0-36.9, adult: Secondary | ICD-10-CM

## 2018-03-01 DIAGNOSIS — I10 Essential (primary) hypertension: Secondary | ICD-10-CM | POA: Diagnosis not present

## 2018-03-01 NOTE — Progress Notes (Signed)
Office: [phone]  /  Fax: [phone]   HPI:   Chief Complaint: OBESITY [name] is here to discuss her progress with her obesity treatment plan. She is on the Category 2 plan and is following her eating plan approximately 25 % of the time. She states she is walking for 30 minutes 3 times per week. [name] maintained her weight. She is mindful of her eating and states her hunger is well controlled. Her weight is 203 lb (92.1 kg) today and has maintained weight over a period of 2 weeks since her last visit. She has lost 18 lbs since starting treatment with [place].  Hypertension [name] is a 67 y.o. female with hypertension. [name] denies chest pain or shortness of breath on exertion. She is working weight loss to help control her blood pressure with the goal of decreasing her risk of heart attack and stroke. [name] blood pressure is stable.  ALLERGIES: Allergies  Allergen Reactions  . Metoprolol Hives  . Onabotulinumtoxina (Cosmetic) Swelling  . Botox [Botulinum Toxin Type A]   . Hydrochlorothiazide Other (See Comments)    Increases risk of gout flares  . Toprol Xl [Metoprolol Succinate] Hives    MEDICATIONS: Current Outpatient Medications on File Prior to Visit  Medication Sig Dispense Refill  . acetaminophen-codeine (TYLENOL #3) 300-30 MG tablet Take 1-2 tablets by mouth every 8 (eight) hours as needed for moderate pain. 30 tablet 0  . ALPRAZolam (XANAX) 0.25 MG tablet Take 0.25 mg by mouth 2 (two) times daily as needed for sleep.    Marland Kitchen amLODipine (NORVASC) 10 MG tablet TAKE 1 TABLET BY MOUTH DAILY 30 tablet 6  . aspirin 81 MG tablet Take 81 mg by mouth daily.    . carvedilol (COREG) 3.125 MG tablet Take 1 tablet (3.125 mg total) by mouth 2 (two) times daily. 60 tablet 2  . desvenlafaxine (PRISTIQ) 100 MG 24 hr tablet Take 100 mg by mouth daily.    . diclofenac (FLECTOR) 1.3 % PTCH Place 1 patch onto the skin 2 (two) times daily.    .  Diclofenac Potassium (CAMBIA) 50 MG PACK Take by mouth.    . diclofenac sodium (VOLTAREN) 1 % GEL Apply 2 g topically 4 (four) times daily. 100 g 3  . Eszopiclone (ESZOPICLONE) 3 MG TABS Take 3 mg by mouth at bedtime. Take immediately before bedtime    . fluconazole (DIFLUCAN) 150 MG tablet Take 150 mg by mouth daily.    . frovatriptan (FROVA) 2.5 MG tablet Take 2.5 mg by mouth as needed for migraine. If recurs, may repeat after 2 hours. Max of 3 tabs in 24 hours.    . gabapentin (NEURONTIN) 300 MG capsule Take 300 mg by mouth 3 (three) times daily.    Marland Kitchen ipratropium (ATROVENT) 0.03 % nasal spray Place 2 sprays into the nose 4 (four) times daily. 30 mL 1  . lisinopril (PRINIVIL,ZESTRIL) 10 MG tablet Take 1 tablet (10 mg total) by mouth daily. 30 tablet 0  . metFORMIN (GLUCOPHAGE) 500 MG tablet Take 1 tablet (500 mg total) by mouth daily. with food 30 tablet 0  . nitroGLYCERIN (NITROSTAT) 0.4 MG SL tablet Place 1 tablet (0.4 mg total) under the tongue every 5 (five) minutes as needed. 30 tablet 0  . Olopatadine HCl (PATADAY) 0.2 % SOLN Apply 1 drop daily as needed to eye (for itching). 2.5 mL 9  . polyethylene glycol (MIRALAX / GLYCOLAX) packet Take 17 g by mouth daily.    Marland Kitchen  scopolamine (TRANSDERM-SCOP) 1 MG/3DAYS Place 1 patch (1.5 mg total) onto the skin every 3 (three) days. 5 patch 1  . valACYclovir (VALTREX) 500 MG tablet Take 1 tablet (500 mg total) by mouth 2 (two) times daily. Use for 3 days as needed for outbreak 30 tablet 2  . Vitamin D, Ergocalciferol, (DRISDOL) 50000 units CAPS capsule Take 1 capsule (50,000 Units total) by mouth every 7 (seven) days. 4 capsule 0   Current Facility-Administered Medications on File Prior to Visit  Medication Dose Route Frequency Provider Last Rate Last Dose  . 0.9 %  sodium chloride infusion  500 mL Intravenous Once Milus Banister, MD        PAST MEDICAL HISTORY: Past Medical History:  Diagnosis Date  . Allergy   . Anxiety   . Arthritis   .  Asthma   . Back pain   . Blood transfusion without reported diagnosis   . Constipation   . Depression   . Diabetes mellitus without complication ([place])   . Dyslipidemia   . GERD (gastroesophageal reflux disease)   . Headaches due to old head injury   . History of stomach ulcers   . Hyperlipidemia   . Hypertension   . Lactose intolerance   . Migraines   . Obesity   . Prediabetes   . Reflux   . Thyroid disease     PAST SURGICAL HISTORY: Past Surgical History:  Procedure Laterality Date  . ABDOMINAL HYSTERECTOMY    . BREAST LUMPECTOMY WITH RADIOACTIVE SEED LOCALIZATION Left 08/23/2016   Procedure: LEFT BREAST LUMPECTOMY WITH RADIOACTIVE SEED LOCALIZATION;  Surgeon: Rolm Bookbinder, MD;  Location: [facility];  Service: General;  Laterality: Left;  . BREAST SURGERY  2003   reduction  . CESAREAN SECTION    . COLONOSCOPY  2008  . FACET JOINT INJECTION  2011 and 2012  . MYOMECTOMY    . NM MYOCAR PERF WALL MOTION  03/2009   dipyridamole; normal pattern of perfusion in all regions, post-stress EF 65%, normal study   . THYROIDECTOMY  1989  . TRANSTHORACIC ECHOCARDIOGRAM  03/2009   EF=>55%, borderline conc LVH; mild mitral annular calcif, trace MR; trace TR; AV mildly sclerotic, mild AV regurg; mild pulm valve regurg   . TUBAL LIGATION      SOCIAL HISTORY: Social History   Tobacco Use  . Smoking status: Never Smoker  . Smokeless tobacco: Never Used  Substance Use Topics  . Alcohol use: Yes    Comment: occ. beer  . Drug use: No    FAMILY HISTORY: Family History  Problem Relation Age of Onset  . Heart disease Mother        MI, HTN  . Hypertension Mother   . Thyroid disease Mother   . Hyperlipidemia Mother   . Sudden death Mother   . Anxiety disorder Mother   . Heart disease Maternal Grandmother        MI, CVA  . Heart disease Maternal Grandfather        MI  . Stroke Maternal Grandfather   . Heart disease Paternal Grandmother   . Stroke Paternal  Grandmother   . Heart disease Paternal Grandfather        MI  . Glaucoma Father   . Diabetes Father   . Depression Daughter   . ADD / ADHD Son   . Thyroid disease Sister   . Heart attack Paternal Uncle        HTN  . Hypertension Paternal Uncle   .  Colon cancer Neg Hx   . Stomach cancer Neg Hx   . Esophageal cancer Neg Hx     ROS: Review of Systems  Constitutional: Negative for weight loss.  Respiratory: Negative for shortness of breath (on exertion).   Cardiovascular: Negative for chest pain.    PHYSICAL EXAM: Blood pressure 116/74, pulse 68, temperature 97.8 F (36.6 C), temperature source Oral, height 5\' 3"  (1.6 m), weight 203 lb (92.1 kg), SpO2 99 %. Body mass index is 35.96 kg/m. Physical Exam  Constitutional: She is oriented to person, place, and time. She appears well-developed and well-nourished.  Cardiovascular: Normal rate.  Pulmonary/Chest: Effort normal.  Musculoskeletal: Normal range of motion.  Neurological: She is oriented to person, place, and time.  Skin: Skin is warm and dry.  Psychiatric: She has a normal mood and affect. Her behavior is normal.  Vitals reviewed.   RECENT LABS AND TESTS: BMET    Component Value Date/Time   NA 140 02/22/2018 1047   NA 143 06/29/2017 1043   K 3.4 (L) 02/22/2018 1047   CL 102 02/22/2018 1047   CO2 32 02/22/2018 1047   GLUCOSE 109 (H) 02/22/2018 1047   BUN 18 02/22/2018 1047   BUN 10 06/29/2017 1043   CREATININE 0.73 02/22/2018 1047   CREATININE 0.73 09/29/2015 0935   CALCIUM 9.7 02/22/2018 1047   GFRNONAA 90 06/29/2017 1043   GFRAA 104 06/29/2017 1043   Lab Results  Component Value Date   HGBA1C 5.7 (H) 06/29/2017   HGBA1C 5.8 04/13/2017   HGBA1C 5.9 02/04/2016   HGBA1C  01/09/2009    5.8 (NOTE)   The ADA recommends the following therapeutic goal for glycemic   control related to Hgb A1C measurement:   Goal of Therapy:   < 7.0% Hgb A1C   Reference: American Diabetes Association: Clinical Practice    Recommendations 2008, Diabetes Care,  2008, 31:(Suppl 1).   Lab Results  Component Value Date   INSULIN 13.1 06/29/2017   CBC    Component Value Date/Time   WBC 5.4 02/22/2018 1047   RBC 4.78 02/22/2018 1047   HGB 14.1 02/22/2018 1047   HGB 13.4 06/29/2017 1043   HCT 41.8 02/22/2018 1047   HCT 39.9 06/29/2017 1043   PLT 285.0 02/22/2018 1047   MCV 87.4 02/22/2018 1047   MCV 88 06/29/2017 1043   MCH 29.6 06/29/2017 1043   MCH 29.1 08/25/2014 1002   MCHC 33.8 02/22/2018 1047   RDW 14.0 02/22/2018 1047   RDW 14.1 06/29/2017 1043   LYMPHSABS 1.9 02/22/2018 1047   LYMPHSABS 1.2 06/29/2017 1043   MONOABS 0.6 02/22/2018 1047   EOSABS 0.1 02/22/2018 1047   EOSABS 0.1 06/29/2017 1043   BASOSABS 0.0 02/22/2018 1047   BASOSABS 0.0 06/29/2017 1043   Iron/TIBC/Ferritin/ %Sat No results found for: IRON, TIBC, FERRITIN, IRONPCTSAT Lipid Panel     Component Value Date/Time   CHOL 183 02/03/2018 0935   CHOL 206 (H) 12/19/2013 0820   TRIG 67 02/03/2018 0935   TRIG 111 12/19/2013 0820   HDL 52 02/03/2018 0935   HDL 70 12/19/2013 0820   CHOLHDL 3.5 02/03/2018 0935   CHOLHDL 3.0 08/08/2016 0828   VLDL 17 08/08/2016 0828   LDLCALC 118 (H) 02/03/2018 0935   LDLCALC 114 (H) 12/19/2013 0820   Hepatic Function Panel     Component Value Date/Time   PROT 7.3 02/22/2018 1047   PROT 6.3 06/29/2017 1043   ALBUMIN 3.8 02/22/2018 1047   ALBUMIN 4.0 06/29/2017 1043  AST 15 02/22/2018 1047   ALT 15 02/22/2018 1047   ALKPHOS 70 02/22/2018 1047   BILITOT 0.5 02/22/2018 1047   BILITOT 0.3 06/29/2017 1043   BILIDIR <0.1 01/09/2009 1604   IBILI NOT CALCULATED 01/09/2009 1604      Component Value Date/Time   TSH 0.503 06/29/2017 1043   TSH 0.48 04/13/2017 0858   TSH 0.70 12/29/2014 0923   Results for BRENDALY, TOWNSEL (MRN [id number]) as of 03/01/2018 11:23  Ref. Range 06/29/2017 10:43  Vitamin D, 25-Hydroxy Latest Ref Range: 30.0 - 100.0 ng/mL 21.7 (L)   ASSESSMENT AND  PLAN: Essential hypertension  Class 2 severe obesity with serious comorbidity and body mass index (BMI) of 36.0 to 36.9 in adult, unspecified obesity type ([place])  PLAN:  Hypertension We discussed sodium restriction, working on healthy weight loss, and a regular exercise program as the means to achieve improved blood pressure control. Cadynce agreed with this plan and agreed to follow up as directed. We will continue to monitor her blood pressure as well as her progress with the above lifestyle modifications. She will continue her medications as prescribed and will watch for signs of hypotension as she continues her lifestyle modifications.  We spent > than 50% of the 15 minute visit on the counseling as documented in the note.  Obesity [name] is currently in the action stage of change. As such, her goal is to continue with weight loss efforts She has agreed to follow the Category 2 plan [name] has been instructed to work up to a goal of 150 minutes of combined cardio and strengthening exercise per week for weight loss and overall health benefits. We discussed the following Behavioral Modification Strategies today: increasing lean protein intake and work on meal planning and easy cooking plans  [name] has agreed to follow up with our clinic in 2 weeks. She was informed of the importance of frequent follow up visits to maximize her success with intensive lifestyle modifications for her multiple health conditions.    OBESITY BEHAVIORAL INTERVENTION VISIT  Today's visit was # 15 out of 22.  Starting weight: 221 lbs Starting date: 06/29/17 Today's weight : 203 lbs Today's date: 03/01/2018 Total lbs lost to date: 19 (Patients must lose 7 lbs in the first 6 months to continue with counseling)   ASK: We discussed the diagnosis of obesity with [name] today and [name] agreed to give [place] permission to discuss obesity behavioral modification therapy  today.  ASSESS: [name] has the diagnosis of obesity and her BMI today is 35.97 [name] is in the action stage of change   ADVISE: [name] was educated on the multiple health risks of obesity as well as the benefit of weight loss to improve her health. She was advised of the need for long term treatment and the importance of lifestyle modifications.  AGREE: Multiple dietary modification options and treatment options were discussed and  [name] agreed to the above obesity treatment plan.   Corey Skains, am acting as transcriptionist for [employer], PA-C I, [facility], have reviewed this note and agree with its content

## 2018-03-07 ENCOUNTER — Encounter: Payer: Self-pay | Admitting: Adult Health

## 2018-03-07 ENCOUNTER — Ambulatory Visit: Payer: BC Managed Care – PPO | Admitting: Adult Health

## 2018-03-07 DIAGNOSIS — I10 Essential (primary) hypertension: Secondary | ICD-10-CM

## 2018-03-07 MED ORDER — LISINOPRIL 20 MG PO TABS: 20.0000 mg | ORAL_TABLET | Freq: Every day | ORAL | 2 refills | Status: DC

## 2018-03-07 NOTE — Progress Notes (Signed)
Cardiology Office Note   Date:  03/07/2018   ID:  Shelby RECENDIZ, DOB 07-Nov-1951, MRN [id number]  PCP:  Shelby Mclean, MD  Cardiologist: Dr. Debara Pickett   Chief Complaint  Patient presents with  . Follow-up    DISCUSS BP AND SURGERY  . Hypertension     History of Present Illness: [name] is a 67 y.o. female who presents for ongoing assessment and management of hypertension, dyslipidemia, strong family history of premature CAD, with other history to include obesity, history of TBI with headaches and memory loss.  She was last seen by Dr. Debara Pickett on 08/03/2017 and appeared to be doing well with exception of having migraine headaches on occasion.  Blood pressure was elevated on last office visit.  She was also noted to have elevated LDL, with "a more realistic goal to be Shelby LDL less than 100"in the setting of diabetes.  She is now being followed by the [facility] and has been seen by Lacy Duverney, [place] on 03/01/2018.  She was advised on specific exercise regimen along with 150 minutes of combined cardio and strengthening exercise.  Medications were not adjusted.  The patient was to follow-up with him in 2 weeks.  Since being seen last, the patient is scheduled for laparoscopic colectomy in the setting of colon cancer.  However she was found to be hypertensive on appointments, and was advised to see cardiology for clearance prior to having surgery.  Surgery is scheduled for Apr 18, 2018 through [place] [facility].  The patient has lost 20 pounds on her weight loss regimen, is walking every day, denies any chest pain but she does have some dyspnea on exertion.  This does not stop her from continuing her walk.  She is taking her blood pressure measurements at home and has brought her monitor with her to compare to our recordings.  Her blood pressure is approximately 15 points higher on her monitor versus our monitor.  Home reading on 58/97 with her blood  pressure recording today at 146/84.  Past Medical History:  Diagnosis Date  . Allergy   . Anxiety   . Arthritis   . Asthma   . Back pain   . Blood transfusion without reported diagnosis   . Constipation   . Depression   . Diabetes mellitus without complication ([place])   . Dyslipidemia   . GERD (gastroesophageal reflux disease)   . Headaches due to old head injury   . History of stomach ulcers   . Hyperlipidemia   . Hypertension   . Lactose intolerance   . Migraines   . Obesity   . Prediabetes   . Reflux   . Thyroid disease     Past Surgical History:  Procedure Laterality Date  . ABDOMINAL HYSTERECTOMY    . BREAST LUMPECTOMY WITH RADIOACTIVE SEED LOCALIZATION Left 08/23/2016   Procedure: LEFT BREAST LUMPECTOMY WITH RADIOACTIVE SEED LOCALIZATION;  Surgeon: Rolm Bookbinder, MD;  Location: [place];  Service: General;  Laterality: Left;  . BREAST SURGERY  2003   reduction  . CESAREAN SECTION    . COLONOSCOPY  2008  . FACET JOINT INJECTION  2011 and 2012  . MYOMECTOMY    . NM MYOCAR PERF WALL MOTION  03/2009   dipyridamole; normal pattern of perfusion in all regions, post-stress EF 65%, normal study   . THYROIDECTOMY  1989  . TRANSTHORACIC ECHOCARDIOGRAM  03/2009   EF=>55%, borderline conc LVH; mild mitral annular calcif, trace  MR; trace TR; AV mildly sclerotic, mild AV regurg; mild pulm valve regurg   . TUBAL LIGATION       Current Outpatient Medications  Medication Sig Dispense Refill  . acetaminophen-codeine (TYLENOL #3) 300-30 MG tablet Take 1-2 tablets by mouth every 8 (eight) hours as needed for moderate pain. 30 tablet 0  . ALPRAZolam (XANAX) 0.25 MG tablet Take 0.25 mg by mouth 2 (two) times daily as needed for sleep.    Marland Kitchen amLODipine (NORVASC) 10 MG tablet TAKE 1 TABLET BY MOUTH DAILY 30 tablet 6  . aspirin 81 MG tablet Take 81 mg by mouth daily.    . carvedilol (COREG) 3.125 MG tablet Take 1 tablet (3.125 mg total) by mouth 2 (two) times daily.  60 tablet 2  . desvenlafaxine (PRISTIQ) 100 MG 24 hr tablet Take 100 mg by mouth daily.    . diclofenac (FLECTOR) 1.3 % PTCH Place 1 patch onto the skin 2 (two) times daily.    . Diclofenac Potassium (CAMBIA) 50 MG PACK Take by mouth.    . diclofenac sodium (VOLTAREN) 1 % GEL Apply 2 g topically 4 (four) times daily. 100 g 3  . Eszopiclone (ESZOPICLONE) 3 MG TABS Take 3 mg by mouth at bedtime. Take immediately before bedtime    . frovatriptan (FROVA) 2.5 MG tablet Take 2.5 mg by mouth as needed for migraine. If recurs, may repeat after 2 hours. Max of 3 tabs in 24 hours.    . gabapentin (NEURONTIN) 300 MG capsule Take 300 mg by mouth 3 (three) times daily.    Marland Kitchen ipratropium (ATROVENT) 0.03 % nasal spray Place 2 sprays into the nose 4 (four) times daily. 30 mL 1  . lisinopril (PRINIVIL,ZESTRIL) 20 MG tablet Take 1 tablet (20 mg total) by mouth daily. 30 tablet 2  . metFORMIN (GLUCOPHAGE) 500 MG tablet Take 1 tablet (500 mg total) by mouth daily. with food 30 tablet 0  . nitroGLYCERIN (NITROSTAT) 0.4 MG SL tablet Place 1 tablet (0.4 mg total) under the tongue every 5 (five) minutes as needed. 30 tablet 0  . Olopatadine HCl (PATADAY) 0.2 % SOLN Apply 1 drop daily as needed to eye (for itching). 2.5 mL 9  . polyethylene glycol (MIRALAX / GLYCOLAX) packet Take 17 g by mouth daily.    . valACYclovir (VALTREX) 500 MG tablet Take 1 tablet (500 mg total) by mouth 2 (two) times daily. Use for 3 days as needed for outbreak 30 tablet 2  . Vitamin D, Ergocalciferol, (DRISDOL) 50000 units CAPS capsule Take 1 capsule (50,000 Units total) by mouth every 7 (seven) days. 4 capsule 0   Current Facility-Administered Medications  Medication Dose Route Frequency Provider Last Rate Last Dose  . 0.9 %  sodium chloride infusion  500 mL Intravenous Once Milus Banister, MD        Allergies:   Metoprolol; Onabotulinumtoxina (cosmetic); Botox [botulinum toxin type a]; Hydrochlorothiazide; and Toprol xl [metoprolol  succinate]    Social History:  The patient  reports that she has never smoked. She has never used smokeless tobacco. She reports that she drinks alcohol. She reports that she does not use drugs.   Family History:  The patient's family history includes ADD / ADHD in her son; Anxiety disorder in her mother; Depression in her daughter; Diabetes in her father; Glaucoma in her father; Heart attack in her paternal uncle; Heart disease in her maternal grandfather, maternal grandmother, mother, paternal grandfather, and paternal grandmother; Hyperlipidemia in her mother; Hypertension in  her mother and paternal uncle; Stroke in her maternal grandfather and paternal grandmother; Sudden death in her mother; Thyroid disease in her mother and sister.    ROS: All other systems are reviewed and negative. Unless otherwise mentioned in H&P    PHYSICAL EXAM: VS:  BP (!) 146/84 (BP Location: Left Arm)   Pulse 62   Ht 5\' 3"  (1.6 m)   Wt 206 lb 12.8 oz (93.8 kg)   SpO2 98%   BMI 36.63 kg/m  , BMI Body mass index is 36.63 kg/m. GEN: Well nourished, well developed, in no acute distress  HEENT: normal  Neck: no JVD, carotid bruits, or masses Cardiac: RRR; no murmurs, rubs, or gallops,no edema  Respiratory:  Clear to auscultation bilaterally, normal work of breathing GI: soft, nontender, nondistended, + BS MS: no deformity or atrophy  Skin: warm and dry, no rash Neuro:  Strength and sensation are intact Psych: euthymic mood, full affect   EKG: Not completed during this office visit  Recent Labs: 06/29/2017: TSH 0.503 02/22/2018: ALT 15; BUN 18; Creatinine, Ser 0.73; Hemoglobin 14.1; Platelets 285.0; Potassium 3.4; Sodium 140    Lipid Panel    Component Value Date/Time   CHOL 183 02/03/2018 0935   CHOL 206 (H) 12/19/2013 0820   TRIG 67 02/03/2018 0935   TRIG 111 12/19/2013 0820   HDL 52 02/03/2018 0935   HDL 70 12/19/2013 0820   CHOLHDL 3.5 02/03/2018 0935   CHOLHDL 3.0 08/08/2016 0828   VLDL 17  08/08/2016 0828   LDLCALC 118 (H) 02/03/2018 0935   LDLCALC 114 (H) 12/19/2013 0820      Wt Readings from Last 3 Encounters:  03/07/18 206 lb 12.8 oz (93.8 kg)  03/01/18 203 lb (92.1 kg)  02/16/18 211 lb (95.7 kg)      Other studies Reviewed: Stress test 04/16/2009 normal perfusion low risk Echocardiogram dated 04/16/2009: Normal LV systolic function, mild MR, mild TR, no aortic stenosis with mild regurg.  Borderline LVH.  ASSESSMENT AND PLAN:  1.  Hypertension: The patient is on multiple medications for blood pressure control to include carvedilol 3.125 mg twice daily, amlodipine 10 mg daily, and lisinopril 10 mg daily.  Blood pressure remains elevated.  I will increase her dose of lisinopril to 20 mg daily.  She will continue carvedilol at current dose.  I am going to have her take her amlodipine in the middle of the day to avoid sudden cumulative effect of antihypertensives.  I am going to see her back in a week to evaluate her blood pressure response to medication changes and overall status.  As she is asymptomatic for chest pain, although she has risk factors of diabetes and hypertension, I will not plan stress testing at this time.  Can consider doing so if blood pressure response is not adequate to evaluate for CAD causing ongoing hypertension.  At which time we will also order Shelby echocardiogram.  Today's visit was to help with blood pressure control only.  2.  Diabetes: She will continue on metformin as directed continue heart healthy low carb diet which she is already participating in in [place] effort to lose weight.  3.  Colon cancer: The patient had a annual colonoscopy on 02/16/2018 by Dr. Ardis Hughs.  He removed polyps in the transverse and ascending colon revealing tubular adenomas which were removed.  Follow-up CT scan of the chest abdomen and pelvis did not reveal any evidence of metastatic disease.  Due to invasive cancer found on polypectomy specimen which  was removed from the sigmoid  colon, she was found to have extensive tumor involvement of the specimen beneath the surface extending into the margin of the resection.  The patient was subsequently planned for a partial laparoscopic colectomy, dated Apr 18, 2018 with Dr. Nadeen Landau MD.   Current medicines are reviewed at length with the patient today.    Labs/ tests ordered today include: none Phill Myron. [place] Pugh, ANP, AACC   03/07/2018 3:55 PM    [facility] [address], [place], [place] [address] Phone: [phone]; Fax: [phone]

## 2018-03-07 NOTE — Patient Instructions (Signed)
Medication Instructions:  INCREASE LISINOPRIL 20MG  IN THE AN W/CARVIDELOL  TAKE AMLODIPINE BETWEEN NOON AND 1PM  TAKE CARVEDILOL IN THE PM  If you need a refill on your cardiac medications before your next appointment, please call your pharmacy.  Special Instructions:  TAKE AND LOG BP DAILY  Follow-Up: Your physician wants you to follow-up in: [place] (NURSE PRACTIONIER), DNP, FOR BP CHECK AND CLEARANCE.    Thank you for choosing [facility] at [facility]!!

## 2018-03-14 NOTE — Progress Notes (Signed)
Cardiology Office Note   Date:  03/15/2018   ID:  [name], DOB 07-02-1951, MRN [id number]  PCP:  Darreld Mclean, MD  Cardiologist:Dr. [facility] Chief Complaint  Patient presents with  . Hypertension  . Pre-op Exam     History of Present Illness: [name] is a 67 y.o. female who presents for ongoing assessment and management of hypertension, dyslipidemia, strong family history of premature CAD, with other history to include obesity, history of TBI with headaches and memory loss and colon cancer. She was last seen by me on 03/07/2018 for pre-operative evaluation for surgery scheduled on Apr 18, 2018 by [facility].   Of noted  she is taking her blood pressure measurements at home and has brought her monitor with her to compare to our recordings.  Her blood pressure is approximately 15 points higher on her monitor versus our monitor.  Home reading on 58/97 with her blood pressure recording today at 146/84. She was advised to take amlodipine in the middle of the day to avoid cumulative affect of medications taken all at once. Lisinopril was increased to 20 mg daily and she was to continue her current dose of carvedilol 3.125 mg BID.  If BP is better controlled she can be released to have surgery. If she is symptomatic will plan stress test.   She is here today with a list of her blood pressures over the last week or so with medication changes as above.  Blood pressure has been much better controlled.  She is not complaining of any dizziness or weakness on higher doses.  Past Medical History:  Diagnosis Date  . Allergy   . Anxiety   . Arthritis   . Asthma   . Back pain   . Blood transfusion without reported diagnosis   . Constipation   . Depression   . Diabetes mellitus without complication ([place])   . Dyslipidemia   . GERD (gastroesophageal reflux disease)   . Headaches due to old head injury   . History of stomach ulcers   . Hyperlipidemia   .  Hypertension   . Lactose intolerance   . Migraines   . Obesity   . Prediabetes   . Reflux   . Thyroid disease     Past Surgical History:  Procedure Laterality Date  . ABDOMINAL HYSTERECTOMY    . BREAST LUMPECTOMY WITH RADIOACTIVE SEED LOCALIZATION Left 08/23/2016   Procedure: LEFT BREAST LUMPECTOMY WITH RADIOACTIVE SEED LOCALIZATION;  Surgeon: Rolm Bookbinder, MD;  Location: [place];  Service: General;  Laterality: Left;  . BREAST SURGERY  2003   reduction  . CESAREAN SECTION    . COLONOSCOPY  2008  . FACET JOINT INJECTION  2011 and 2012  . MYOMECTOMY    . NM MYOCAR PERF WALL MOTION  03/2009   dipyridamole; normal pattern of perfusion in all regions, post-stress EF 65%, normal study   . THYROIDECTOMY  1989  . TRANSTHORACIC ECHOCARDIOGRAM  03/2009   EF=>55%, borderline conc LVH; mild mitral annular calcif, trace MR; trace TR; AV mildly sclerotic, mild AV regurg; mild pulm valve regurg   . TUBAL LIGATION       Current Outpatient Medications  Medication Sig Dispense Refill  . acetaminophen-codeine (TYLENOL #3) 300-30 MG tablet Take 1-2 tablets by mouth every 8 (eight) hours as needed for moderate pain. 30 tablet 0  . ALPRAZolam (XANAX) 0.25 MG tablet Take 0.25 mg by mouth 2 (two) times daily as needed for sleep.    Marland Kitchen  amLODipine (NORVASC) 10 MG tablet TAKE 1 TABLET BY MOUTH DAILY 30 tablet 6  . aspirin 81 MG tablet Take 81 mg by mouth daily.    . carvedilol (COREG) 3.125 MG tablet Take 1 tablet (3.125 mg total) by mouth 2 (two) times daily. 60 tablet 2  . desvenlafaxine (PRISTIQ) 100 MG 24 hr tablet Take 100 mg by mouth daily.    . diclofenac (FLECTOR) 1.3 % PTCH Place 1 patch onto the skin 2 (two) times daily.    . Diclofenac Potassium (CAMBIA) 50 MG PACK Take by mouth.    . diclofenac sodium (VOLTAREN) 1 % GEL Apply 2 g topically 4 (four) times daily. 100 g 3  . Eszopiclone (ESZOPICLONE) 3 MG TABS Take 3 mg by mouth at bedtime. Take immediately before bedtime      . frovatriptan (FROVA) 2.5 MG tablet Take 2.5 mg by mouth as needed for migraine. If recurs, may repeat after 2 hours. Max of 3 tabs in 24 hours.    . gabapentin (NEURONTIN) 300 MG capsule Take 300 mg by mouth 3 (three) times daily.    Marland Kitchen ipratropium (ATROVENT) 0.03 % nasal spray Place 2 sprays into the nose 4 (four) times daily. 30 mL 1  . lisinopril (PRINIVIL,ZESTRIL) 20 MG tablet Take 1 tablet (20 mg total) by mouth daily. 30 tablet 2  . metFORMIN (GLUCOPHAGE) 500 MG tablet Take 1 tablet (500 mg total) by mouth daily. with food 30 tablet 0  . nitroGLYCERIN (NITROSTAT) 0.4 MG SL tablet Place 1 tablet (0.4 mg total) under the tongue every 5 (five) minutes as needed. 30 tablet 0  . Olopatadine HCl (PATADAY) 0.2 % SOLN Apply 1 drop daily as needed to eye (for itching). 2.5 mL 9  . polyethylene glycol (MIRALAX / GLYCOLAX) packet Take 17 g by mouth daily.    . valACYclovir (VALTREX) 500 MG tablet Take 1 tablet (500 mg total) by mouth 2 (two) times daily. Use for 3 days as needed for outbreak 30 tablet 2  . Vitamin D, Ergocalciferol, (DRISDOL) 50000 units CAPS capsule Take 1 capsule (50,000 Units total) by mouth every 7 (seven) days. 4 capsule 0   Current Facility-Administered Medications  Medication Dose Route Frequency Provider Last Rate Last Dose  . 0.9 %  sodium chloride infusion  500 mL Intravenous Once Milus Banister, MD        Allergies:   Metoprolol; Hydrochlorothiazide; Onabotulinumtoxina (cosmetic); Botox [botulinum toxin type a]; Onabotulinumtoxina; and Toprol xl [metoprolol succinate]    Social History:  The patient  reports that she has never smoked. She has never used smokeless tobacco. She reports that she drinks alcohol. She reports that she does not use drugs.   Family History:  The patient's family history includes ADD / ADHD in her son; Anxiety disorder in her mother; Depression in her daughter; Diabetes in her father; Glaucoma in her father; Heart attack in her paternal uncle;  Heart disease in her maternal grandfather, maternal grandmother, mother, paternal grandfather, and paternal grandmother; Hyperlipidemia in her mother; Hypertension in her mother and paternal uncle; Stroke in her maternal grandfather and paternal grandmother; Sudden death in her mother; Thyroid disease in her mother and sister.    ROS: All other systems are reviewed and negative. Unless otherwise mentioned in H&P    PHYSICAL EXAM: VS:  BP 122/80   Pulse (!) 53   Ht 5\' 3"  (1.6 m)   Wt 207 lb 3.2 oz (94 kg)   SpO2 96%   BMI 36.70  kg/m  , BMI Body mass index is 36.7 kg/m. GEN: Well nourished, well developed, in no acute distress  HEENT: normal  Neck: no JVD, carotid bruits, or masses Cardiac: RRR; no murmurs, rubs, or gallops,no edema  Respiratory:  clear to auscultation bilaterally, normal work of breathing GI: soft, nontender, nondistended, + BS MS: no deformity or atrophy  Skin: warm and dry, no rash Neuro:  Strength and sensation are intact Psych: euthymic mood, full affect   EKG: Not completed during this office visit.  Recent Labs: 06/29/2017: TSH 0.503 02/22/2018: ALT 15; BUN 18; Creatinine, Ser 0.73; Hemoglobin 14.1; Platelets 285.0; Potassium 3.4; Sodium 140    Lipid Panel    Component Value Date/Time   CHOL 183 02/03/2018 0935   CHOL 206 (H) 12/19/2013 0820   TRIG 67 02/03/2018 0935   TRIG 111 12/19/2013 0820   HDL 52 02/03/2018 0935   HDL 70 12/19/2013 0820   CHOLHDL 3.5 02/03/2018 0935   CHOLHDL 3.0 08/08/2016 0828   VLDL 17 08/08/2016 0828   LDLCALC 118 (H) 02/03/2018 0935   LDLCALC 114 (H) 12/19/2013 0820      Wt Readings from Last 3 Encounters:  03/15/18 207 lb 3.2 oz (94 kg)  03/07/18 206 lb 12.8 oz (93.8 kg)  03/01/18 203 lb (92.1 kg)      Other studies Reviewed: Echo scanned along with stress test scanned from previous evaluation with [facility].   ASSESSMENT AND PLAN:  1.  Hypertension: Much better controlled on  adjustment of lisinopril and also having amlodipine taken in the middle of the day to avoid cumulative effects of antihypertensive medications being taken all at once.  The patient is doing well and offers no cardiac complaints.  Chart reviewed as part of pre-operative protocol coverage. Given past medical history and time since last visit, based on ACC/AHA guidelines, POLLIE POMA would be at acceptable risk for the planned procedure without further cardiovascular testing.   This note will be sent to surgeon Dr. Nadeen Landau with [facility] to proceed with planned procedure  2.  Colon cancer: Plan colectomy as Dr. Dema Severin  3.  Diabetes: Management per primary care.  4.  Obesity: Patient is on weight loss regimen and has lost greater than 20 pounds with diet and exercise.  She is encouraged to continue this lifestyle change can be followed by the wellness center who is managing her progress and monitoring labs.   Current medicines are reviewed at length with the patient today.  Would like to see her again in 3 months for ongoing evaluation of cardiovascular risk factors.  He begins to be symptomatic we may proceed with a stress test or echocardiogram but for now the patient has been doing well and been asymptomatic.  Labs/ tests ordered today include: None Phill Myron. [place] Pugh, ANP, AACC   03/15/2018 12:04 PM    [facility] [address] [place], [place] [address] Phone: [phone]; Fax: [phone]

## 2018-03-15 ENCOUNTER — Ambulatory Visit: Payer: BC Managed Care – PPO | Admitting: Adult Health

## 2018-03-15 ENCOUNTER — Ambulatory Visit (INDEPENDENT_AMBULATORY_CARE_PROVIDER_SITE_OTHER): Payer: BC Managed Care – PPO | Admitting: Physician Assistant

## 2018-03-15 ENCOUNTER — Encounter: Payer: Self-pay | Admitting: Adult Health

## 2018-03-15 VITALS — BP 122/80 | HR 53 | Ht 63.0 in | Wt 207.2 lb

## 2018-03-15 DIAGNOSIS — I10 Essential (primary) hypertension: Secondary | ICD-10-CM | POA: Diagnosis not present

## 2018-03-15 DIAGNOSIS — E7800 Pure hypercholesterolemia, unspecified: Secondary | ICD-10-CM | POA: Diagnosis not present

## 2018-03-15 DIAGNOSIS — Z01810 Encounter for preprocedural cardiovascular examination: Secondary | ICD-10-CM

## 2018-03-15 NOTE — Patient Instructions (Signed)
Special Instructions: CLEARED FOR LAPAROSCOPIC SIGMOID COLECTOMY, FLEXIBLE SIGMOIDOSCOPY W/DR WHITE   Follow-Up: Your physician wants you to follow-up in: [place] should receive a reminder letter in the mail two months in advance. If you do not receive a letter, please call our office 06-2018 to schedule the 08-2018 follow-up appointment.   Thank you for choosing [facility] at [facility]!!

## 2018-03-19 ENCOUNTER — Other Ambulatory Visit (INDEPENDENT_AMBULATORY_CARE_PROVIDER_SITE_OTHER): Payer: Self-pay | Admitting: Physician Assistant

## 2018-03-19 DIAGNOSIS — E559 Vitamin D deficiency, unspecified: Secondary | ICD-10-CM

## 2018-03-21 ENCOUNTER — Encounter: Payer: Self-pay | Admitting: Family Medicine

## 2018-03-21 MED ORDER — FLUCONAZOLE 150 MG PO TABS: 150.0000 mg | ORAL_TABLET | Freq: Once | ORAL | 0 refills | Status: AC

## 2018-03-27 ENCOUNTER — Ambulatory Visit (INDEPENDENT_AMBULATORY_CARE_PROVIDER_SITE_OTHER): Payer: BC Managed Care – PPO | Admitting: Physician Assistant

## 2018-03-27 VITALS — BP 152/84 | HR 55 | Temp 97.5°F | Ht 63.0 in | Wt 200.0 lb

## 2018-03-27 DIAGNOSIS — E559 Vitamin D deficiency, unspecified: Secondary | ICD-10-CM | POA: Diagnosis not present

## 2018-03-27 DIAGNOSIS — I10 Essential (primary) hypertension: Secondary | ICD-10-CM

## 2018-03-27 DIAGNOSIS — Z9189 Other specified personal risk factors, not elsewhere classified: Secondary | ICD-10-CM

## 2018-03-27 DIAGNOSIS — Z6835 Body mass index (BMI) 35.0-35.9, adult: Secondary | ICD-10-CM

## 2018-03-27 MED ORDER — VITAMIN D (ERGOCALCIFEROL) 1.25 MG (50000 UNIT) PO CAPS: 50000.0000 [IU] | ORAL_CAPSULE | ORAL | 0 refills | Status: DC

## 2018-03-27 NOTE — Progress Notes (Signed)
Office: [phone]  /  Fax: [phone]   HPI:   Chief Complaint: OBESITY [name] is here to discuss her progress with her obesity treatment plan. She is on the Category 2 plan and is following her eating plan approximately 0 % of the time. She states she is walking for 30 minutes 2 times per week. [name] continues to do well with weight loss. She states she has been skipping some of her meals, as she has had a decrease in her appetite. [name] requests a longer follow up, as she is scheduled for colon cancer surgery in 3 weeks.  Her weight is 200 lb (90.7 kg) today and has had a weight loss of 3 pounds over a period of 3 weeks since her last visit. She has lost 21 lbs since starting treatment with [place].  Vitamin D deficiency [name] has a diagnosis of vitamin D deficiency. She is currently taking vit D and denies nausea, vomiting or muscle weakness.  At risk for osteopenia and osteoporosis [name] is at higher risk of osteopenia and osteoporosis due to vitamin D deficiency.   Hypertension [name] is a 67 y.o. female with hypertension. Her blood pressure is elevated today at 152/84. [name] states her blood pressure at home is stable. She declines any adjustment to her medications today. [name] denies chest pain or shortness of breath on exertion. She is working weight loss to help control her blood pressure with the goal of decreasing her risk of heart attack and stroke. [name] blood pressure is not currently controlled.  ALLERGIES: Allergies  Allergen Reactions  . Metoprolol Hives  . Hydrochlorothiazide Other (See Comments)    Increases risk of gout flares Other reaction(s): Other (See Comments) Increases risk of gout flares  . Onabotulinumtoxina (Cosmetic) Swelling  . Botox [Botulinum Toxin Type A]   . Onabotulinumtoxina   . Toprol Xl [Metoprolol Succinate] Hives    MEDICATIONS: Current Outpatient Medications on File Prior to Visit    Medication Sig Dispense Refill  . acetaminophen-codeine (TYLENOL #3) 300-30 MG tablet Take 1-2 tablets by mouth every 8 (eight) hours as needed for moderate pain. 30 tablet 0  . ALPRAZolam (XANAX) 0.25 MG tablet Take 0.25 mg by mouth 2 (two) times daily as needed for sleep.    Marland Kitchen amLODipine (NORVASC) 10 MG tablet TAKE 1 TABLET BY MOUTH DAILY 30 tablet 6  . aspirin 81 MG tablet Take 81 mg by mouth daily.    . carvedilol (COREG) 3.125 MG tablet Take 1 tablet (3.125 mg total) by mouth 2 (two) times daily. 60 tablet 2  . desvenlafaxine (PRISTIQ) 100 MG 24 hr tablet Take 100 mg by mouth daily.    . diclofenac (FLECTOR) 1.3 % PTCH Place 1 patch onto the skin 2 (two) times daily.    . Diclofenac Potassium (CAMBIA) 50 MG PACK Take by mouth.    . diclofenac sodium (VOLTAREN) 1 % GEL Apply 2 g topically 4 (four) times daily. 100 g 3  . Eszopiclone (ESZOPICLONE) 3 MG TABS Take 3 mg by mouth at bedtime. Take immediately before bedtime    . frovatriptan (FROVA) 2.5 MG tablet Take 2.5 mg by mouth as needed for migraine. If recurs, may repeat after 2 hours. Max of 3 tabs in 24 hours.    . gabapentin (NEURONTIN) 300 MG capsule Take 300 mg by mouth 3 (three) times daily.    Marland Kitchen ipratropium (ATROVENT) 0.03 % nasal spray Place 2 sprays into the nose 4 (four) times daily. Orin  mL 1  . lisinopril (PRINIVIL,ZESTRIL) 20 MG tablet Take 1 tablet (20 mg total) by mouth daily. 30 tablet 2  . metFORMIN (GLUCOPHAGE) 500 MG tablet Take 1 tablet (500 mg total) by mouth daily. with food 30 tablet 0  . nitroGLYCERIN (NITROSTAT) 0.4 MG SL tablet Place 1 tablet (0.4 mg total) under the tongue every 5 (five) minutes as needed. 30 tablet 0  . Olopatadine HCl (PATADAY) 0.2 % SOLN Apply 1 drop daily as needed to eye (for itching). 2.5 mL 9  . polyethylene glycol (MIRALAX / GLYCOLAX) packet Take 17 g by mouth daily.    . valACYclovir (VALTREX) 500 MG tablet Take 1 tablet (500 mg total) by mouth 2 (two) times daily. Use for 3 days as needed  for outbreak 30 tablet 2   Current Facility-Administered Medications on File Prior to Visit  Medication Dose Route Frequency Provider Last Rate Last Dose  . 0.9 %  sodium chloride infusion  500 mL Intravenous Once Milus Banister, MD        PAST MEDICAL HISTORY: Past Medical History:  Diagnosis Date  . Allergy   . Anxiety   . Arthritis   . Asthma   . Back pain   . Blood transfusion without reported diagnosis   . Constipation   . Depression   . Diabetes mellitus without complication ([place])   . Dyslipidemia   . GERD (gastroesophageal reflux disease)   . Headaches due to old head injury   . History of stomach ulcers   . Hyperlipidemia   . Hypertension   . Lactose intolerance   . Migraines   . Obesity   . Prediabetes   . Reflux   . Thyroid disease     PAST SURGICAL HISTORY: Past Surgical History:  Procedure Laterality Date  . ABDOMINAL HYSTERECTOMY    . BREAST LUMPECTOMY WITH RADIOACTIVE SEED LOCALIZATION Left 08/23/2016   Procedure: LEFT BREAST LUMPECTOMY WITH RADIOACTIVE SEED LOCALIZATION;  Surgeon: Rolm Bookbinder, MD;  Location: [facility];  Service: General;  Laterality: Left;  . BREAST SURGERY  2003   reduction  . CESAREAN SECTION    . COLONOSCOPY  2008  . FACET JOINT INJECTION  2011 and 2012  . MYOMECTOMY    . NM MYOCAR PERF WALL MOTION  03/2009   dipyridamole; normal pattern of perfusion in all regions, post-stress EF 65%, normal study   . THYROIDECTOMY  1989  . TRANSTHORACIC ECHOCARDIOGRAM  03/2009   EF=>55%, borderline conc LVH; mild mitral annular calcif, trace MR; trace TR; AV mildly sclerotic, mild AV regurg; mild pulm valve regurg   . TUBAL LIGATION      SOCIAL HISTORY: Social History   Tobacco Use  . Smoking status: Never Smoker  . Smokeless tobacco: Never Used  Substance Use Topics  . Alcohol use: Yes    Comment: occ. beer  . Drug use: No    FAMILY HISTORY: Family History  Problem Relation Age of Onset  . Heart disease  Mother        MI, HTN  . Hypertension Mother   . Thyroid disease Mother   . Hyperlipidemia Mother   . Sudden death Mother   . Anxiety disorder Mother   . Heart disease Maternal Grandmother        MI, CVA  . Heart disease Maternal Grandfather        MI  . Stroke Maternal Grandfather   . Heart disease Paternal Grandmother   . Stroke Paternal Grandmother   .  Heart disease Paternal Grandfather        MI  . Glaucoma Father   . Diabetes Father   . Depression Daughter   . ADD / ADHD Son   . Thyroid disease Sister   . Heart attack Paternal Uncle        HTN  . Hypertension Paternal Uncle   . Colon cancer Neg Hx   . Stomach cancer Neg Hx   . Esophageal cancer Neg Hx     ROS: Review of Systems  Constitutional: Positive for weight loss.  Respiratory: Negative for shortness of breath (on exertion).   Cardiovascular: Negative for chest pain.  Gastrointestinal: Negative for nausea and vomiting.  Musculoskeletal:       Negative for muscle weakness    PHYSICAL EXAM: Blood pressure (!) 152/84, pulse (!) 55, temperature (!) 97.5 F (36.4 C), temperature source Oral, height 5\' 3"  (1.6 m), weight 200 lb (90.7 kg), SpO2 98 %. Body mass index is 35.43 kg/m. Physical Exam  Constitutional: She is oriented to person, place, and time. She appears well-developed and well-nourished.  Cardiovascular: Bradycardia present.  Pulmonary/Chest: Effort normal.  Musculoskeletal: Normal range of motion.  Neurological: She is oriented to person, place, and time.  Skin: Skin is warm and dry.  Psychiatric: She has a normal mood and affect. Her behavior is normal.  Vitals reviewed.   RECENT LABS AND TESTS: BMET    Component Value Date/Time   NA 140 02/22/2018 1047   NA 143 06/29/2017 1043   K 3.4 (L) 02/22/2018 1047   CL 102 02/22/2018 1047   CO2 32 02/22/2018 1047   GLUCOSE 109 (H) 02/22/2018 1047   BUN 18 02/22/2018 1047   BUN 10 06/29/2017 1043   CREATININE 0.73 02/22/2018 1047    CREATININE 0.73 09/29/2015 0935   CALCIUM 9.7 02/22/2018 1047   GFRNONAA 90 06/29/2017 1043   GFRAA 104 06/29/2017 1043   Lab Results  Component Value Date   HGBA1C 5.7 (H) 06/29/2017   HGBA1C 5.8 04/13/2017   HGBA1C 5.9 02/04/2016   HGBA1C  01/09/2009    5.8 (NOTE)   The ADA recommends the following therapeutic goal for glycemic   control related to Hgb A1C measurement:   Goal of Therapy:   < 7.0% Hgb A1C   Reference: American Diabetes Association: Clinical Practice   Recommendations 2008, Diabetes Care,  2008, 31:(Suppl 1).   Lab Results  Component Value Date   INSULIN 13.1 06/29/2017   CBC    Component Value Date/Time   WBC 5.4 02/22/2018 1047   RBC 4.78 02/22/2018 1047   HGB 14.1 02/22/2018 1047   HGB 13.4 06/29/2017 1043   HCT 41.8 02/22/2018 1047   HCT 39.9 06/29/2017 1043   PLT 285.0 02/22/2018 1047   MCV 87.4 02/22/2018 1047   MCV 88 06/29/2017 1043   MCH 29.6 06/29/2017 1043   MCH 29.1 08/25/2014 1002   MCHC 33.8 02/22/2018 1047   RDW 14.0 02/22/2018 1047   RDW 14.1 06/29/2017 1043   LYMPHSABS 1.9 02/22/2018 1047   LYMPHSABS 1.2 06/29/2017 1043   MONOABS 0.6 02/22/2018 1047   EOSABS 0.1 02/22/2018 1047   EOSABS 0.1 06/29/2017 1043   BASOSABS 0.0 02/22/2018 1047   BASOSABS 0.0 06/29/2017 1043   Iron/TIBC/Ferritin/ %Sat No results found for: IRON, TIBC, FERRITIN, IRONPCTSAT Lipid Panel     Component Value Date/Time   CHOL 183 02/03/2018 0935   CHOL 206 (H) 12/19/2013 0820   TRIG 67 02/03/2018 0935   TRIG  111 12/19/2013 0820   HDL 52 02/03/2018 0935   HDL 70 12/19/2013 0820   CHOLHDL 3.5 02/03/2018 0935   CHOLHDL 3.0 08/08/2016 0828   VLDL 17 08/08/2016 0828   LDLCALC 118 (H) 02/03/2018 0935   LDLCALC 114 (H) 12/19/2013 0820   Hepatic Function Panel     Component Value Date/Time   PROT 7.3 02/22/2018 1047   PROT 6.3 06/29/2017 1043   ALBUMIN 3.8 02/22/2018 1047   ALBUMIN 4.0 06/29/2017 1043   AST 15 02/22/2018 1047   ALT 15 02/22/2018 1047    ALKPHOS 70 02/22/2018 1047   BILITOT 0.5 02/22/2018 1047   BILITOT 0.3 06/29/2017 1043   BILIDIR <0.1 01/09/2009 1604   IBILI NOT CALCULATED 01/09/2009 1604      Component Value Date/Time   TSH 0.503 06/29/2017 1043   TSH 0.48 04/13/2017 0858   TSH 0.70 12/29/2014 0923   Results for SHALANDA, BROGDEN (MRN [id number]) as of 03/27/2018 15:33  Ref. Range 06/29/2017 10:43  Vitamin D, 25-Hydroxy Latest Ref Range: 30.0 - 100.0 ng/mL 21.7 (L)   ASSESSMENT AND PLAN: Vitamin D deficiency - Plan: Vitamin D, Ergocalciferol, (DRISDOL) 50000 units CAPS capsule  Essential hypertension  At risk for osteoporosis  Class 2 severe obesity with serious comorbidity and body mass index (BMI) of 35.0 to 35.9 in adult, unspecified obesity type (HCC)  PLAN:  Vitamin D Deficiency [name] was informed that low vitamin D levels contributes to fatigue and are associated with obesity, breast, and colon cancer. She agrees to continue to take prescription Vit D @50 ,000 IU every week #4 with no refills and will follow up for routine testing of vitamin D, at least 2-3 times per year. She was informed of the risk of over-replacement of vitamin D and agrees to not increase her dose unless she discusses this with [place] first. [name] agrees to follow up with our clinic in 5 weeks.  At risk for osteopenia and osteoporosis [name] is at risk for osteopenia and osteoporosis due to her vitamin D deficiency. She was encouraged to take her vitamin D and follow her higher calcium diet and increase strengthening exercise to help strengthen her bones and decrease her risk of osteopenia and osteoporosis.  Hypertension We discussed sodium restriction, working on healthy weight loss, and a regular exercise program as the means to achieve improved blood pressure control. [name] agreed with this plan and agreed to follow up as directed. We will continue to monitor her blood pressure as well as her progress with the above  lifestyle modifications. She will continue her medications as prescribed and will watch for signs of hypotension as she continues her lifestyle modifications.  Obesity [name] is currently in the action stage of change. As such, her goal is to continue with weight loss efforts She has agreed to follow the [place] eating plan [name] has been instructed to work up to a goal of 150 minutes of combined cardio and strengthening exercise per week for weight loss and overall health benefits. We discussed the following Behavioral Modification Strategies today: increasing lean protein intake and no skipping meals  [name] has agreed to follow up with our clinic in 5 weeks. She was informed of the importance of frequent follow up visits to maximize her success with intensive lifestyle modifications for her multiple health conditions.    OBESITY BEHAVIORAL INTERVENTION VISIT  Today's visit was # 16 out of 22.  Starting weight: 221 lbs Starting date: 06/29/17 Today's weight : 200 lbs  Today's date:  03/27/2018 Total lbs lost to date: 21 (Patients must lose 7 lbs in the first 6 months to continue with counseling)   ASK: We discussed the diagnosis of obesity with [name] today and [name] agreed to give [place] permission to discuss obesity behavioral modification therapy today.  ASSESS: [name] has the diagnosis of obesity and her BMI today is 35.44 [name] is in the action stage of change   ADVISE: [name] was educated on the multiple health risks of obesity as well as the benefit of weight loss to improve her health. She was advised of the need for long term treatment and the importance of lifestyle modifications.  AGREE: Multiple dietary modification options and treatment options were discussed and  [name] agreed to the above obesity treatment plan.   Corey Skains, am acting as transcriptionist for [employer], PA-C I, Lacy Duverney [facility], have reviewed this note and  agree with its content

## 2018-04-09 NOTE — Progress Notes (Signed)
[place] at [facility] [address] [place], [place] [address] [phone]  Date:  04/11/2018   Name:  [name]   DOB:  03-18-1951   MRN:  [id number]  PCP:  Darreld Mclean, MD    Chief Complaint: Hyperlipidemia (follow up, hair growing on chest-hysterectomy years ago, nodule on neck enlarged); Hypertension; and Pre-diabetes   History of Present Illness:  [name] is a 67 y.o. very pleasant female patient who presents with the following:  Periodic follow-up visit today.   I last saw her about 6 months ago at which time she was doing very well!  In the interim she was dx with colon cancer- she is scheduled for a sigmoid colectomy in about one week. She feels positive and upbeat and hopes that all will be ok.  She is being treated by [facility]  Per GI notes regarding pathology: I spoke with her about final path (colon cancer, sigmoid colon). She needs staging workup (CT scans chest/abd/pelvis with IV and oral contrast; cbc, cmet, CEA) and also referral to [facility] for new diagnosis of colon cancer.  The cancer was small, 30cm from anus, apparently completely removed with polypectomy (see colonoscopy report). I left an [place] tattoo at the site and so it should be fairly easy to locate surgically. Certainly if any concern about location I will be happy to assist in OR with sigmoidoscopy/colonoscopy.  History of obesity, hyperlipidemia, pre-diabetes, migraine, HTN She is attending the healthy weight and wellness clinic.  She does like this clinic, but notes that she is still a stress eater She is down from 222 at her max- this is great progress for her  She likes to walk for exercise- however the recent rains have slowed her down a bit, she may start walking indoors more Wt Readings from Last 3 Encounters:  04/11/18 207 lb (93.9 kg)  03/27/18 200 lb (90.7 kg)  03/15/18 207 lb 3.2 oz (94 kg)   Due to  start pneumonia series now that she is 67 yo  Lab Results  Component Value Date   HGBA1C 5.7 (H) 06/29/2017    She has noted enlargement of her thyroid over the last year or so and is starting to get concerned  She had a thyroidectomy in 1992- she though this was total but on [place] in 2016 we saw that she still had a left lobe, she actually had a bx for a cyst at that time  06/22/15 IMPRESSION: 1. Interval enlargement of left thyroid nodule. Findings meet consensus criteria for biopsy. Ultrasound-guided fine needle aspiration should be considered, as per the consensus statement: Management of Thyroid Nodules Detected at [place]: Society of Radiologists in [employer]. Radiology 2005; [id number]. 2. Previous right hemithyroidectomy without residual or recurrent tissue.  Patient Active Problem List   Diagnosis Date Noted  . Class 2 obesity with serious comorbidity and body mass index (BMI) of 38.0 to 38.9 in adult 07/27/2017  . Vitamin D deficiency 07/27/2017  . Other hyperlipidemia 06/29/2017  . Prediabetes 06/29/2017  . Other fatigue 06/29/2017  . Shortness of breath on exertion 06/29/2017  . Class 2 obesity with serious comorbidity and body mass index (BMI) of 39.0 to 39.9 in adult 06/29/2017  . Left hip pain 04/18/2017  . Pre-diabetes 02/05/2016  . Left thyroid nodule 11/10/2014  . TBI (traumatic brain injury) ([place]) 07/15/2014  . Chest pain 12/13/2013  . Dyslipidemia 12/13/2013  .  Obesity (BMI 35.0-39.9 without comorbidity) 12/13/2013  . Hypertension 01/18/2012  . GERD (gastroesophageal reflux disease) 01/18/2012  . Depression 01/18/2012  . Migraine syndrome 01/18/2012    Past Medical History:  Diagnosis Date  . Allergy   . Anxiety   . Arthritis   . Asthma   . Back pain   . Blood transfusion without reported diagnosis   . Constipation   . Depression   . Diabetes mellitus without complication ([place])   . Dyslipidemia   . GERD  (gastroesophageal reflux disease)   . Headaches due to old head injury   . History of stomach ulcers   . Hyperlipidemia   . Hypertension   . Lactose intolerance   . Migraines   . Obesity   . Prediabetes   . Reflux   . Thyroid disease     Past Surgical History:  Procedure Laterality Date  . ABDOMINAL HYSTERECTOMY    . BREAST LUMPECTOMY WITH RADIOACTIVE SEED LOCALIZATION Left 08/23/2016   Procedure: LEFT BREAST LUMPECTOMY WITH RADIOACTIVE SEED LOCALIZATION;  Surgeon: Rolm Bookbinder, MD;  Location: [facility];  Service: General;  Laterality: Left;  . BREAST SURGERY  2003   reduction  . CESAREAN SECTION    . COLONOSCOPY  2008  . FACET JOINT INJECTION  2011 and 2012  . MYOMECTOMY    . NM MYOCAR PERF WALL MOTION  03/2009   dipyridamole; normal pattern of perfusion in all regions, post-stress EF 65%, normal study   . THYROIDECTOMY  1989  . TRANSTHORACIC ECHOCARDIOGRAM  03/2009   EF=>55%, borderline conc LVH; mild mitral annular calcif, trace MR; trace TR; AV mildly sclerotic, mild AV regurg; mild pulm valve regurg   . TUBAL LIGATION      Social History   Tobacco Use  . Smoking status: Never Smoker  . Smokeless tobacco: Never Used  Substance Use Topics  . Alcohol use: Yes    Comment: occ. beer  . Drug use: No    Family History  Problem Relation Age of Onset  . Heart disease Mother        MI, HTN  . Hypertension Mother   . Thyroid disease Mother   . Hyperlipidemia Mother   . Sudden death Mother   . Anxiety disorder Mother   . Heart disease Maternal Grandmother        MI, CVA  . Heart disease Maternal Grandfather        MI  . Stroke Maternal Grandfather   . Heart disease Paternal Grandmother   . Stroke Paternal Grandmother   . Heart disease Paternal Grandfather        MI  . Glaucoma Father   . Diabetes Father   . Depression Daughter   . ADD / ADHD Son   . Thyroid disease Sister   . Heart attack Paternal Uncle        HTN  . Hypertension Paternal  Uncle   . Colon cancer Neg Hx   . Stomach cancer Neg Hx   . Esophageal cancer Neg Hx     Allergies  Allergen Reactions  . Hydrochlorothiazide Other (See Comments)    Increases risk of gout flares   . Botox [Botulinum Toxin Type A] Swelling  . Onabotulinumtoxina Swelling  . Toprol Xl [Metoprolol Succinate] Hives    Medication list has been reviewed and updated.  Current Outpatient Medications on File Prior to Visit  Medication Sig Dispense Refill  . ALPRAZolam (XANAX) 0.25 MG tablet Take 0.25 mg by mouth 2 (  two) times daily as needed for anxiety.     Marland Kitchen amLODipine (NORVASC) 10 MG tablet TAKE 1 TABLET BY MOUTH DAILY (Patient taking differently: TAKE 1 TABLET BY MOUTH DAILY AT NOON) 30 tablet 6  . carvedilol (COREG) 3.125 MG tablet Take 1 tablet (3.125 mg total) by mouth 2 (two) times daily. 60 tablet 2  . cetirizine (ZYRTEC) 10 MG tablet Take 10 mg by mouth daily as needed for allergies.    Marland Kitchen desvenlafaxine (PRISTIQ) 100 MG 24 hr tablet Take 100 mg by mouth daily.    . diclofenac (FLECTOR) 1.3 % PTCH Place 1 patch onto the skin 2 (two) times daily as needed (pain).     . Diclofenac Potassium (CAMBIA) 50 MG PACK Take 50 mg by mouth daily as needed (migraines).     . diclofenac sodium (VOLTAREN) 1 % GEL Apply 2 g topically 4 (four) times daily. (Patient taking differently: Apply 2 g topically daily as needed (back pain). ) 100 g 3  . frovatriptan (FROVA) 2.5 MG tablet Take 2.5 mg by mouth as needed for migraine. If recurs, may repeat after 2 hours. Max of 3 tabs in 24 hours.    . gabapentin (NEURONTIN) 300 MG capsule Take 300 mg by mouth 3 (three) times daily as needed (headaches).     Marland Kitchen ibuprofen (ADVIL,MOTRIN) 200 MG tablet Take 200 mg by mouth 3 (three) times daily as needed for headache or moderate pain.    Marland Kitchen ipratropium (ATROVENT) 0.03 % nasal spray Place 2 sprays into the nose 4 (four) times daily. (Patient taking differently: Place 2 sprays into the nose 4 (four) times daily as  needed for rhinitis. ) 30 mL 1  . lisinopril (PRINIVIL,ZESTRIL) 20 MG tablet Take 1 tablet (20 mg total) by mouth daily. 30 tablet 2  . metFORMIN (GLUCOPHAGE) 500 MG tablet Take 1 tablet (500 mg total) by mouth daily. with food (Patient taking differently: Take 500 mg by mouth every evening. with food) 30 tablet 0  . naproxen sodium (ALEVE) 220 MG tablet Take 220 mg by mouth daily as needed (pain).    . nitroGLYCERIN (NITROSTAT) 0.4 MG SL tablet Place 1 tablet (0.4 mg total) under the tongue every 5 (five) minutes as needed. (Patient taking differently: Place 0.4 mg under the tongue every 5 (five) minutes as needed for chest pain. ) 30 tablet 0  . Olopatadine HCl (PATADAY) 0.2 % SOLN Apply 1 drop daily as needed to eye (for itching). 2.5 mL 9  . omeprazole (PRILOSEC) 40 MG capsule Take 40 mg by mouth daily.    . polyethylene glycol (MIRALAX / GLYCOLAX) packet Take 17 g by mouth daily.    . valACYclovir (VALTREX) 500 MG tablet Take 1 tablet (500 mg total) by mouth 2 (two) times daily. Use for 3 days as needed for outbreak 30 tablet 2  . Vitamin D, Ergocalciferol, (DRISDOL) 50000 units CAPS capsule Take 1 capsule (50,000 Units total) by mouth every 7 (seven) days. 4 capsule 0   No current facility-administered medications on file prior to visit.     Review of Systems:  As per HPI- otherwise negative.   Physical Examination: Vitals:   04/11/18 0911  BP: (!) 162/80  Pulse: 60  Resp: 16  SpO2: 99%   Vitals:   04/11/18 0911  Weight: 207 lb (93.9 kg)  Height: 5\' 3"  (1.6 m)   Body mass index is 36.67 kg/m. Ideal Body Weight: Weight in (lb) to have BMI = 25: 140.8  GEN: WDWN,  NAD, Non-toxic, A & O x 3, obese, looks well  HEENT: Atraumatic, Normocephalic. Neck supple. No masses, No LAD.  Bilateral TM wnl, oropharynx normal.  PEERL,EOMI.   She does have an enlarged left thyroid on exam today- per pt this seems to be getting larger  Ears and Nose: No external deformity. CV: RRR, No M/G/R.  No JVD. No thrill. No extra heart sounds. PULM: CTA B, no wheezes, crackles, rhonchi. No retractions. No resp. distress. No accessory muscle use. EXTR: No c/c/e NEURO Normal gait.  PSYCH: Normally interactive. Conversant. Not depressed or anxious appearing.  Calm demeanor.    Assessment and Plan: Malignant neoplasm of colon, unspecified part of colon ([place])  Prediabetes - Plan: Hemoglobin A1c  Immunization due - Plan: Pneumococcal conjugate vaccine 13-valent IM  Thyroid enlargement - Plan: TSH, US THYROID  Following up today unfortunately Chester was dx with colon cancer this spring, she is getting a partial colectomy in about one week,.  Offered support and encouragement  A1c today Gave prevnar Set up thyroid US to be done after her colon surgery, in about one month TSH today   Signed Lamar Blinks, MD

## 2018-04-11 ENCOUNTER — Ambulatory Visit: Payer: BC Managed Care – PPO | Admitting: Family Medicine

## 2018-04-11 ENCOUNTER — Encounter (HOSPITAL_COMMUNITY): Payer: Self-pay

## 2018-04-11 ENCOUNTER — Encounter: Payer: Self-pay | Admitting: Family Medicine

## 2018-04-11 VITALS — BP 152/80 | HR 60 | Resp 16 | Ht 63.0 in | Wt 207.0 lb

## 2018-04-11 DIAGNOSIS — E041 Nontoxic single thyroid nodule: Secondary | ICD-10-CM | POA: Diagnosis not present

## 2018-04-11 DIAGNOSIS — E049 Nontoxic goiter, unspecified: Secondary | ICD-10-CM

## 2018-04-11 DIAGNOSIS — Z23 Encounter for immunization: Secondary | ICD-10-CM | POA: Diagnosis not present

## 2018-04-11 DIAGNOSIS — C189 Malignant neoplasm of colon, unspecified: Secondary | ICD-10-CM

## 2018-04-11 DIAGNOSIS — R7303 Prediabetes: Secondary | ICD-10-CM

## 2018-04-11 DIAGNOSIS — I10 Essential (primary) hypertension: Secondary | ICD-10-CM | POA: Diagnosis not present

## 2018-04-11 NOTE — Patient Instructions (Signed)
Best of luck with your upcoming operation- I hope that you have an easy recovery.  Let me know if you need anything I ordered a TSH and A1c for you to have drawn with the rest of your labs tomorrow  You got your prevnar 13 pneumonia vaccine today- you will be due for the pneumovax booster in a year  We will set up an [place] of your thyroid for you.  I asked for this to be done in about one month. Let me know if you don't hear about this in a reasonable time frame

## 2018-04-11 NOTE — Patient Instructions (Addendum)
Your procedure is scheduled on: Wednesday, Apr 18, 2018   Surgery Time:  7:15AM-11:15AM   Report to [facility]  Entrance    Report to admitting at 5:30 AM   Call this number if you have problems the morning of surgery [phone]   Miralax:  Mix with 64 oz Gatorade/Powerade. Drink gradually over the next few hours (8 oz glass every 15-30 minutes) until gone the day prior to surgery.    Neomycin:  At 2 pm, 3 pm and 10 pm after Miralax bowel prep the day prior to surgery.   Metronidazole:  At 2 pm, 3 pm and 10 pm after Miralax bowel prep the day prior to surgery.   Clear liquids after midnight. Nothing in your mouth after 4:30AM prior to scheduled surgery.   CLEAR LIQUID DIET   Foods Allowed                                                                     Foods Excluded  Coffee and tea, regular and decaf                             liquids that you cannot  Plain Jell-O in any flavor                                             see through such as: Fruit ices (not with fruit pulp)                                     milk, soups, orange juice  Iced Popsicles                                    All solid food Carbonated beverages, regular and diet                                    Cranberry, grape and apple juices Sports drinks like Gatorade Lightly seasoned clear broth or consume(fat free) Sugar, honey syrup  Sample Menu Breakfast                                Lunch                                     Supper Cranberry juice                    Beef broth                            Chicken broth Jell-O  Grape juice                           Apple juice Coffee or tea                        Jell-O                                      Popsicle                                                Coffee or tea                        Coffee or tea    Do NOT smoke after Midnight   Drink 2 Ensure drinks the night before surgery  at 10:00PM.     Complete one Ensure drink the morning of surgery 3 hours prior to scheduled surgery. (By 4:30AM)   Take these medicines the morning of surgery with A SIP OF WATER: Carvedilol, Pristiq, Omeprazole, Xanax if needed   DO NOT TAKE ANY DIABETIC MEDICATIONS DAY OF YOUR SURGERY                               You may not have any metal on your body including hair pins, jewelry, and body piercings             Do not wear make-up, lotions, powders, perfumes/cologne, or deodorant             Do not wear nail polish.  Do not shave  48 hours prior to surgery.                Do not bring valuables to the hospital. [place].   Contacts, dentures or bridgework may not be worn into surgery.   Leave suitcase in the car. After surgery it may be brought to your room.   Special Instructions: Bring a copy of your healthcare power of attorney and living will documents         the day of surgery if you haven't scanned them in before.              Please read over the following fact sheets you were given:  [facility] - Preparing for Surgery Before surgery, you can play an important role.  Because skin is not sterile, your skin needs to be as free of germs as possible.  You can reduce the number of germs on your skin by washing with CHG (chlorahexidine gluconate) soap before surgery.  CHG is an antiseptic cleaner which kills germs and bonds with the skin to continue killing germs even after washing. Please DO NOT use if you have an allergy to CHG or antibacterial soaps.  If your skin becomes reddened/irritated stop using the CHG and inform your nurse when you arrive at Short Stay. Do not shave (including legs and underarms) for at least 48 hours prior to the first CHG shower.  You may shave your face/neck.  Please follow these instructions carefully:  1.  Shower with CHG Soap the night before surgery and the  morning of surgery.  2.  If you choose  to wash your hair, wash your hair first as usual with your normal  shampoo.  3.  After you shampoo, rinse your hair and body thoroughly to remove the shampoo.                             4.  Use CHG as you would any other liquid soap.  You can apply chg directly to the skin and wash.  Gently with a scrungie or clean washcloth.  5.  Apply the CHG Soap to your body ONLY FROM THE NECK DOWN.   Do   not use on face/ open                           Wound or open sores. Avoid contact with eyes, ears mouth and   genitals (private parts).                       Wash face,  Genitals (private parts) with your normal soap.             6.  Wash thoroughly, paying special attention to the area where your    surgery  will be performed.  7.  Thoroughly rinse your body with warm water from the neck down.  8.  DO NOT shower/wash with your normal soap after using and rinsing off the CHG Soap.                9.  Pat yourself dry with a clean towel.            10.  Wear clean pajamas.            11.  Place clean sheets on your bed the night of your first shower and do not  sleep with pets. Day of Surgery : Do not apply any lotions/deodorants the morning of surgery.  Please wear clean clothes to the hospital/surgery center.  FAILURE TO FOLLOW THESE INSTRUCTIONS MAY RESULT IN THE CANCELLATION OF YOUR SURGERY  PATIENT SIGNATURE_________________________________  NURSE SIGNATURE__________________________________  ________________________________________________________________________   [name]  An incentive spirometer is a tool that can help keep your lungs clear and active. This tool measures how well you are filling your lungs with each breath. Taking long deep breaths may help reverse or decrease the chance of developing breathing (pulmonary) problems (especially infection) following:  A long period of time when you are unable to move or be active. BEFORE THE PROCEDURE   If the spirometer  includes an indicator to show your best effort, your nurse or respiratory therapist will set it to a desired goal.  If possible, sit up straight or lean slightly forward. Try not to slouch.  Hold the incentive spirometer in an upright position. INSTRUCTIONS FOR USE  1. Sit on the edge of your bed if possible, or sit up as far as you can in bed or on a chair. 2. Hold the incentive spirometer in an upright position. 3. Breathe out normally. 4. Place the mouthpiece in your mouth and seal your lips tightly around it. 5. Breathe in slowly and as deeply as possible, raising the piston or the ball toward the top of the column. 6. Hold your breath for 3-5 seconds or  for as long as possible. Allow the piston or ball to fall to the bottom of the column. 7. Remove the mouthpiece from your mouth and breathe out normally. 8. Rest for a few seconds and repeat Steps 1 through 7 at least 10 times every 1-2 hours when you are awake. Take your time and take a few normal breaths between deep breaths. 9. The spirometer may include an indicator to show your best effort. Use the indicator as a goal to work toward during each repetition. 10. After each set of 10 deep breaths, practice coughing to be sure your lungs are clear. If you have an incision (the cut made at the time of surgery), support your incision when coughing by placing a pillow or rolled up towels firmly against it. Once you are able to get out of bed, walk around indoors and cough well. You may stop using the incentive spirometer when instructed by your caregiver.  RISKS AND COMPLICATIONS  Take your time so you do not get dizzy or light-headed.  If you are in pain, you may need to take or ask for pain medication before doing incentive spirometry. It is harder to take a deep breath if you are having pain. AFTER USE  Rest and breathe slowly and easily.  It can be helpful to keep track of a log of your progress. Your caregiver can provide you with a  simple table to help with this. If you are using the spirometer at home, follow these instructions: [place] IF:   You are having difficultly using the spirometer.  You have trouble using the spirometer as often as instructed.  Your pain medication is not giving enough relief while using the spirometer.  You develop fever of 100.5 F (38.1 C) or higher. SEEK IMMEDIATE MEDICAL CARE IF:   You cough up bloody sputum that had not been present before.  You develop fever of 102 F (38.9 C) or greater.  You develop worsening pain at or near the incision site. MAKE SURE YOU:   Understand these instructions.  Will watch your condition.  Will get help right away if you are not doing well or get worse. Document Released: 03/27/2007 Document Revised: 02/06/2012 Document Reviewed: 05/28/2007 ExitCare Patient Information 2014 ExitCare, [place].   ________________________________________________________________________  WHAT IS A BLOOD TRANSFUSION? Blood Transfusion Information  A transfusion is the replacement of blood or some of its parts. Blood is made up of multiple cells which provide different functions.  Red blood cells carry oxygen and are used for blood loss replacement.  White blood cells fight against infection.  Platelets control bleeding.  Plasma helps clot blood.  Other blood products are available for specialized needs, such as hemophilia or other clotting disorders. BEFORE THE TRANSFUSION  Who gives blood for transfusions?   Healthy volunteers who are fully evaluated to make sure their blood is safe. This is blood bank blood. Transfusion therapy is the safest it has ever been in the practice of medicine. Before blood is taken from a donor, a complete history is taken to make sure that person has no history of diseases nor engages in risky social behavior (examples are intravenous drug use or sexual activity with multiple partners). The donor's travel history  is screened to minimize risk of transmitting infections, such as malaria. The donated blood is tested for signs of infectious diseases, such as HIV and hepatitis. The blood is then tested to be sure it is compatible with you in order to minimize  the chance of a transfusion reaction. If you or a relative donates blood, this is often done in anticipation of surgery and is not appropriate for emergency situations. It takes many days to process the donated blood. RISKS AND COMPLICATIONS Although transfusion therapy is very safe and saves many lives, the main dangers of transfusion include:   Getting an infectious disease.  Developing a transfusion reaction. This is an allergic reaction to something in the blood you were given. Every precaution is taken to prevent this. The decision to have a blood transfusion has been considered carefully by your caregiver before blood is given. Blood is not given unless the benefits outweigh the risks. AFTER THE TRANSFUSION  Right after receiving a blood transfusion, you will usually feel much better and more energetic. This is especially true if your red blood cells have gotten low (anemic). The transfusion raises the level of the red blood cells which carry oxygen, and this usually causes an energy increase.  The nurse administering the transfusion will monitor you carefully for complications. HOME CARE INSTRUCTIONS  No special instructions are needed after a transfusion. You may find your energy is better. Speak with your caregiver about any limitations on activity for underlying diseases you may have. SEEK MEDICAL CARE IF:   Your condition is not improving after your transfusion.  You develop redness or irritation at the intravenous (IV) site. SEEK IMMEDIATE MEDICAL CARE IF:  Any of the following symptoms occur over the next 12 hours:  Shaking chills.  You have a temperature by mouth above 102 F (38.9 C), not controlled by medicine.  Chest, back, or  muscle pain.  People around you feel you are not acting correctly or are confused.  Shortness of breath or difficulty breathing.  Dizziness and fainting.  You get a rash or develop hives.  You have a decrease in urine output.  Your urine turns a dark color or changes to pink, red, or brown. Any of the following symptoms occur over the next 10 days:  You have a temperature by mouth above 102 F (38.9 C), not controlled by medicine.  Shortness of breath.  Weakness after normal activity.  The white part of the eye turns yellow (jaundice).  You have a decrease in the amount of urine or are urinating less often.  Your urine turns a dark color or changes to pink, red, or brown. Document Released: 11/11/2000 Document Revised: 02/06/2012 Document Reviewed: 06/30/2008 ExitCare Patient Information 2014 ExitCare, [place].  _______________________________________________________________________  How to Manage Your Diabetes Before and After Surgery  Why is it important to control my blood sugar before and after surgery? . Improving blood sugar levels before and after surgery helps healing and can limit problems. . A way of improving blood sugar control is eating a healthy diet by: o  Eating less sugar and carbohydrates o  Increasing activity/exercise o  Talking with your doctor about reaching your blood sugar goals . High blood sugars (greater than 180 mg/dL) can raise your risk of infections and slow your recovery, so you will need to focus on controlling your diabetes during the weeks before surgery. . Make sure that the doctor who takes care of your diabetes knows about your planned surgery including the date and location.  How do I manage my blood sugar before surgery? . Check your blood sugar at least 4 times a day, starting 2 days before surgery, to make sure that the level is not too high or low. .  . If  your blood sugar is less than 70 mg/dL, you will need to treat for low  blood sugar: o Do not take insulin. o Treat a low blood sugar (less than 70 mg/dL) with  cup of clear juice (cranberry or apple), 4 glucose tablets, OR glucose gel. o Recheck blood sugar in 15 minutes after treatment (to make sure it is greater than 70 mg/dL). If your blood sugar is not greater than 70 mg/dL on recheck, call [phone] for further instructions. . Report your blood sugar to the short stay nurse when you get to Short Stay.  . If you are admitted to the hospital after surgery: o Your blood sugar will be checked by the staff and you will probably be given insulin after surgery (instead of oral diabetes medicines) to make sure you have good blood sugar levels. o The goal for blood sugar control after surgery is 80-180 mg/dL.

## 2018-04-12 ENCOUNTER — Encounter (HOSPITAL_COMMUNITY)
Admission: RE | Admit: 2018-04-12 | Discharge: 2018-04-12 | Disposition: A | Discharge status: Home / Self Care | Payer: BC Managed Care – PPO | Source: Ambulatory Visit | Attending: Surgery | Admitting: Surgery

## 2018-04-12 ENCOUNTER — Ambulatory Visit (HOSPITAL_COMMUNITY)
Admission: RE | Admit: 2018-04-12 | Discharge: 2018-04-12 | Disposition: A | Discharge status: Home / Self Care | Payer: BC Managed Care – PPO | Source: Ambulatory Visit | Attending: Anesthesiology | Admitting: Anesthesiology

## 2018-04-12 ENCOUNTER — Encounter (HOSPITAL_COMMUNITY): Payer: Self-pay

## 2018-04-12 ENCOUNTER — Other Ambulatory Visit: Payer: Self-pay

## 2018-04-12 DIAGNOSIS — Z01812 Encounter for preprocedural laboratory examination: Secondary | ICD-10-CM | POA: Insufficient documentation

## 2018-04-12 DIAGNOSIS — I77810 Thoracic aortic ectasia: Secondary | ICD-10-CM | POA: Insufficient documentation

## 2018-04-12 DIAGNOSIS — Z01810 Encounter for preprocedural cardiovascular examination: Secondary | ICD-10-CM | POA: Diagnosis not present

## 2018-04-12 DIAGNOSIS — I517 Cardiomegaly: Secondary | ICD-10-CM | POA: Diagnosis not present

## 2018-04-12 DIAGNOSIS — C189 Malignant neoplasm of colon, unspecified: Secondary | ICD-10-CM | POA: Diagnosis not present

## 2018-04-12 DIAGNOSIS — Z01818 Encounter for other preprocedural examination: Secondary | ICD-10-CM

## 2018-04-12 HISTORY — DX: Unspecified intracranial injury with loss of consciousness status unknown, initial encounter: S06.9XAA

## 2018-04-12 HISTORY — DX: Benign lipomatous neoplasm of other sites: D17.79

## 2018-04-12 HISTORY — DX: Dyspnea, unspecified: R06.00

## 2018-04-12 HISTORY — DX: Heart disease, unspecified: I51.9

## 2018-04-12 HISTORY — DX: Other ill-defined heart diseases: I51.89

## 2018-04-12 HISTORY — DX: Vitamin D deficiency, unspecified: E55.9

## 2018-04-12 HISTORY — DX: Pain in left hip: M25.552

## 2018-04-12 HISTORY — DX: Unspecified intracranial injury with loss of consciousness of unspecified duration, initial encounter: S06.9X9A

## 2018-04-12 HISTORY — DX: Nontoxic single thyroid nodule: E04.1

## 2018-04-12 HISTORY — DX: Anemia, unspecified: D64.9

## 2018-04-12 HISTORY — DX: Other fatigue: R53.83

## 2018-04-12 HISTORY — DX: Fatty (change of) liver, not elsewhere classified: K76.0

## 2018-04-12 HISTORY — DX: Malignant neoplasm of sigmoid colon: C18.7

## 2018-04-12 HISTORY — DX: Prediabetes: R73.03

## 2018-04-12 LAB — COMPREHENSIVE METABOLIC PANEL
ALT: 17 U/L (ref 14–54)
ANION GAP: 9 (ref 5–15)
AST: 17 U/L (ref 15–41)
Albumin: 3.8 g/dL (ref 3.5–5.0)
Alkaline Phosphatase: 72 U/L (ref 38–126)
BILIRUBIN TOTAL: 0.7 mg/dL (ref 0.3–1.2)
BUN: 10 mg/dL (ref 6–20)
CALCIUM: 9.3 mg/dL (ref 8.9–10.3)
CO2: 29 mmol/L (ref 22–32)
Chloride: 106 mmol/L (ref 101–111)
Creatinine, Ser: 0.73 mg/dL (ref 0.44–1.00)
GLUCOSE: 93 mg/dL (ref 65–99)
Potassium: 3.8 mmol/L (ref 3.5–5.1)
Sodium: 144 mmol/L (ref 135–145)
TOTAL PROTEIN: 7.5 g/dL (ref 6.5–8.1)

## 2018-04-12 LAB — CBC WITH DIFFERENTIAL/PLATELET
Basophils Absolute: 0 10*3/uL (ref 0.0–0.1)
Basophils Relative: 0 %
Eosinophils Absolute: 0.1 10*3/uL (ref 0.0–0.7)
Eosinophils Relative: 2 %
HEMATOCRIT: 42.1 % (ref 36.0–46.0)
Hemoglobin: 13.9 g/dL (ref 12.0–15.0)
LYMPHS ABS: 1.3 10*3/uL (ref 0.7–4.0)
Lymphocytes Relative: 27 %
MCH: 29.3 pg (ref 26.0–34.0)
MCHC: 33 g/dL (ref 30.0–36.0)
MCV: 88.8 fL (ref 78.0–100.0)
MONOS PCT: 6 %
Monocytes Absolute: 0.3 10*3/uL (ref 0.1–1.0)
NEUTROS ABS: 3.1 10*3/uL (ref 1.7–7.7)
NEUTROS PCT: 65 %
Platelets: 271 10*3/uL (ref 150–400)
RBC: 4.74 MIL/uL (ref 3.87–5.11)
RDW: 13.7 % (ref 11.5–15.5)
WBC: 4.8 10*3/uL (ref 4.0–10.5)

## 2018-04-12 LAB — TSH: TSH: 0.396 u[IU]/mL (ref 0.350–4.500)

## 2018-04-12 LAB — GLUCOSE, CAPILLARY: Glucose-Capillary: 82 mg/dL (ref 65–99)

## 2018-04-12 LAB — ABO/RH: ABO/RH(D): A POS

## 2018-04-12 MED ORDER — NEOMYCIN SULFATE 500 MG PO TABS
1000.0000 mg | ORAL_TABLET | ORAL | Status: DC
  Filled 2018-04-12: qty 2

## 2018-04-12 MED ORDER — METRONIDAZOLE 500 MG PO TABS
1000.0000 mg | ORAL_TABLET | ORAL | Status: DC
  Filled 2018-04-12: qty 2

## 2018-04-12 MED ORDER — POLYETHYLENE GLYCOL 3350 17 GM/SCOOP PO POWD
1.0000 | Freq: Once | ORAL | Status: DC
  Filled 2018-04-12: qty 255

## 2018-04-12 NOTE — Pre-Procedure Instructions (Signed)
Left chart with Ihechi to follow up with [facility]GB A 1C results 04/12/2018.

## 2018-04-13 LAB — HEMOGLOBIN A1C
Hgb A1c MFr Bld: 5.5 % (ref 4.8–5.6)
MEAN PLASMA GLUCOSE: 111 mg/dL

## 2018-04-18 ENCOUNTER — Inpatient Hospital Stay (HOSPITAL_COMMUNITY): Payer: BC Managed Care – PPO | Admitting: Anesthesiology

## 2018-04-18 ENCOUNTER — Encounter (HOSPITAL_COMMUNITY): Payer: Self-pay | Admitting: *Deleted

## 2018-04-18 ENCOUNTER — Inpatient Hospital Stay (HOSPITAL_COMMUNITY)
Admission: RE | Admit: 2018-04-18 | Discharge: 2018-04-21 | DRG: 330 | Disposition: A | Discharge status: Home / Self Care | Payer: BC Managed Care – PPO | Source: Ambulatory Visit | Attending: Surgery | Admitting: Surgery

## 2018-04-18 ENCOUNTER — Other Ambulatory Visit: Payer: Self-pay

## 2018-04-18 ENCOUNTER — Encounter (HOSPITAL_COMMUNITY)
Admission: RE | Disposition: A | Discharge status: Home / Self Care | Payer: Self-pay | Source: Ambulatory Visit | Attending: Surgery

## 2018-04-18 DIAGNOSIS — Z823 Family history of stroke: Secondary | ICD-10-CM | POA: Diagnosis not present

## 2018-04-18 DIAGNOSIS — E119 Type 2 diabetes mellitus without complications: Secondary | ICD-10-CM | POA: Diagnosis present

## 2018-04-18 DIAGNOSIS — K219 Gastro-esophageal reflux disease without esophagitis: Secondary | ICD-10-CM | POA: Diagnosis present

## 2018-04-18 DIAGNOSIS — Z833 Family history of diabetes mellitus: Secondary | ICD-10-CM | POA: Diagnosis not present

## 2018-04-18 DIAGNOSIS — Z818 Family history of other mental and behavioral disorders: Secondary | ICD-10-CM | POA: Diagnosis not present

## 2018-04-18 DIAGNOSIS — Z8349 Family history of other endocrine, nutritional and metabolic diseases: Secondary | ICD-10-CM

## 2018-04-18 DIAGNOSIS — C187 Malignant neoplasm of sigmoid colon: Principal | ICD-10-CM | POA: Diagnosis present

## 2018-04-18 DIAGNOSIS — K760 Fatty (change of) liver, not elsewhere classified: Secondary | ICD-10-CM | POA: Diagnosis present

## 2018-04-18 DIAGNOSIS — F419 Anxiety disorder, unspecified: Secondary | ICD-10-CM | POA: Diagnosis present

## 2018-04-18 DIAGNOSIS — Z86010 Personal history of colonic polyps: Secondary | ICD-10-CM

## 2018-04-18 DIAGNOSIS — Z8249 Family history of ischemic heart disease and other diseases of the circulatory system: Secondary | ICD-10-CM | POA: Diagnosis not present

## 2018-04-18 DIAGNOSIS — C189 Malignant neoplasm of colon, unspecified: Secondary | ICD-10-CM | POA: Diagnosis present

## 2018-04-18 DIAGNOSIS — Z90710 Acquired absence of both cervix and uterus: Secondary | ICD-10-CM

## 2018-04-18 DIAGNOSIS — Z8711 Personal history of peptic ulcer disease: Secondary | ICD-10-CM

## 2018-04-18 DIAGNOSIS — Z83511 Family history of glaucoma: Secondary | ICD-10-CM | POA: Diagnosis not present

## 2018-04-18 DIAGNOSIS — Z888 Allergy status to other drugs, medicaments and biological substances status: Secondary | ICD-10-CM | POA: Diagnosis not present

## 2018-04-18 DIAGNOSIS — I10 Essential (primary) hypertension: Secondary | ICD-10-CM | POA: Diagnosis present

## 2018-04-18 DIAGNOSIS — E669 Obesity, unspecified: Secondary | ICD-10-CM | POA: Diagnosis present

## 2018-04-18 DIAGNOSIS — N179 Acute kidney failure, unspecified: Secondary | ICD-10-CM | POA: Diagnosis present

## 2018-04-18 DIAGNOSIS — Z87820 Personal history of traumatic brain injury: Secondary | ICD-10-CM

## 2018-04-18 DIAGNOSIS — K649 Unspecified hemorrhoids: Secondary | ICD-10-CM | POA: Diagnosis present

## 2018-04-18 DIAGNOSIS — E876 Hypokalemia: Secondary | ICD-10-CM | POA: Diagnosis present

## 2018-04-18 DIAGNOSIS — E739 Lactose intolerance, unspecified: Secondary | ICD-10-CM | POA: Diagnosis present

## 2018-04-18 DIAGNOSIS — E785 Hyperlipidemia, unspecified: Secondary | ICD-10-CM | POA: Diagnosis present

## 2018-04-18 DIAGNOSIS — F329 Major depressive disorder, single episode, unspecified: Secondary | ICD-10-CM | POA: Diagnosis present

## 2018-04-18 HISTORY — PX: LAPAROSCOPIC SIGMOID COLECTOMY: SHX5928

## 2018-04-18 HISTORY — PX: FLEXIBLE SIGMOIDOSCOPY: SHX5431

## 2018-04-18 LAB — TYPE AND SCREEN
ABO/RH(D): A POS
ANTIBODY SCREEN: NEGATIVE

## 2018-04-18 LAB — GLUCOSE, CAPILLARY
GLUCOSE-CAPILLARY: 176 mg/dL — AB (ref 65–99)
Glucose-Capillary: 175 mg/dL — ABNORMAL HIGH (ref 65–99)
Glucose-Capillary: 144 mg/dL — ABNORMAL HIGH (ref 65–99)
Glucose-Capillary: 157 mg/dL — ABNORMAL HIGH (ref 65–99)

## 2018-04-18 SURGERY — COLECTOMY, SIGMOID, LAPAROSCOPIC
Anesthesia: General | Site: Rectum

## 2018-04-18 MED ORDER — PHENYLEPHRINE 40 MCG/ML (10ML) SYRINGE FOR IV PUSH (FOR BLOOD PRESSURE SUPPORT)
PREFILLED_SYRINGE | INTRAVENOUS | Status: DC | PRN
  Administered 2018-04-18: 80 ug via INTRAVENOUS

## 2018-04-18 MED ORDER — PROPOFOL 10 MG/ML IV BOLUS
INTRAVENOUS | Status: AC
  Filled 2018-04-18: qty 20

## 2018-04-18 MED ORDER — ACETAMINOPHEN 500 MG PO TABS
1000.0000 mg | ORAL_TABLET | ORAL | Status: AC
  Administered 2018-04-18: 1000 mg via ORAL
  Filled 2018-04-18: qty 2

## 2018-04-18 MED ORDER — PANTOPRAZOLE SODIUM 40 MG PO TBEC
40.0000 mg | DELAYED_RELEASE_TABLET | Freq: Every day | ORAL | Status: DC
  Administered 2018-04-18 – 2018-04-21 (×4): 40 mg via ORAL
  Filled 2018-04-18 (×4): qty 1

## 2018-04-18 MED ORDER — DEXAMETHASONE SODIUM PHOSPHATE 10 MG/ML IJ SOLN
INTRAMUSCULAR | Status: DC | PRN
  Administered 2018-04-18: 10 mg via INTRAVENOUS

## 2018-04-18 MED ORDER — BUPIVACAINE-EPINEPHRINE (PF) 0.25% -1:200000 IJ SOLN
INTRAMUSCULAR | Status: DC | PRN
  Administered 2018-04-18: 30 mL

## 2018-04-18 MED ORDER — ALUM & MAG HYDROXIDE-SIMETH 200-200-20 MG/5ML PO SUSP: 30.0000 mL | Freq: Four times a day (QID) | ORAL | Status: DC | PRN

## 2018-04-18 MED ORDER — SACCHAROMYCES BOULARDII 250 MG PO CAPS
250.0000 mg | ORAL_CAPSULE | Freq: Two times a day (BID) | ORAL | Status: DC
  Administered 2018-04-18 – 2018-04-21 (×7): 250 mg via ORAL
  Filled 2018-04-18 (×7): qty 1

## 2018-04-18 MED ORDER — BUPIVACAINE-EPINEPHRINE (PF) 0.25% -1:200000 IJ SOLN
INTRAMUSCULAR | Status: AC
  Filled 2018-04-18: qty 30

## 2018-04-18 MED ORDER — IBUPROFEN 200 MG PO TABS
600.0000 mg | ORAL_TABLET | Freq: Four times a day (QID) | ORAL | Status: DC
  Administered 2018-04-18 – 2018-04-20 (×8): 600 mg via ORAL
  Filled 2018-04-18 (×8): qty 3

## 2018-04-18 MED ORDER — PHENYLEPHRINE 40 MCG/ML (10ML) SYRINGE FOR IV PUSH (FOR BLOOD PRESSURE SUPPORT)
PREFILLED_SYRINGE | INTRAVENOUS | Status: AC
  Filled 2018-04-18: qty 10

## 2018-04-18 MED ORDER — GABAPENTIN 300 MG PO CAPS
300.0000 mg | ORAL_CAPSULE | Freq: Three times a day (TID) | ORAL | Status: DC
  Administered 2018-04-18 – 2018-04-21 (×9): 300 mg via ORAL
  Filled 2018-04-18 (×9): qty 1

## 2018-04-18 MED ORDER — MEPERIDINE HCL 50 MG/ML IJ SOLN: 6.2500 mg | INTRAMUSCULAR | Status: DC | PRN

## 2018-04-18 MED ORDER — DEXAMETHASONE SODIUM PHOSPHATE 10 MG/ML IJ SOLN
INTRAMUSCULAR | Status: AC
  Filled 2018-04-18: qty 1

## 2018-04-18 MED ORDER — ONDANSETRON HCL 4 MG/2ML IJ SOLN
INTRAMUSCULAR | Status: AC
  Filled 2018-04-18: qty 2

## 2018-04-18 MED ORDER — DIPHENHYDRAMINE HCL 12.5 MG/5ML PO ELIX: 12.5000 mg | ORAL_SOLUTION | Freq: Four times a day (QID) | ORAL | Status: DC | PRN

## 2018-04-18 MED ORDER — SUMATRIPTAN SUCCINATE 50 MG PO TABS
50.0000 mg | ORAL_TABLET | ORAL | Status: DC | PRN
  Filled 2018-04-18: qty 1

## 2018-04-18 MED ORDER — DESVENLAFAXINE SUCCINATE ER 50 MG PO TB24
100.0000 mg | ORAL_TABLET | Freq: Every day | ORAL | Status: DC
  Administered 2018-04-19 – 2018-04-21 (×3): 100 mg via ORAL
  Filled 2018-04-18 (×3): qty 2

## 2018-04-18 MED ORDER — DIPHENHYDRAMINE HCL 50 MG/ML IJ SOLN: 12.5000 mg | Freq: Four times a day (QID) | INTRAMUSCULAR | Status: DC | PRN

## 2018-04-18 MED ORDER — SUCCINYLCHOLINE CHLORIDE 200 MG/10ML IV SOSY
PREFILLED_SYRINGE | INTRAVENOUS | Status: DC | PRN
  Administered 2018-04-18: 120 mg via INTRAVENOUS

## 2018-04-18 MED ORDER — ALPRAZOLAM 0.25 MG PO TABS
0.2500 mg | ORAL_TABLET | Freq: Two times a day (BID) | ORAL | Status: DC | PRN
  Administered 2018-04-19: 0.25 mg via ORAL
  Filled 2018-04-18: qty 1

## 2018-04-18 MED ORDER — LACTATED RINGERS IV SOLN
INTRAVENOUS | Status: DC
  Administered 2018-04-18 (×3): via INTRAVENOUS

## 2018-04-18 MED ORDER — HEPARIN SODIUM (PORCINE) 5000 UNIT/ML IJ SOLN
5000.0000 [IU] | Freq: Three times a day (TID) | INTRAMUSCULAR | Status: DC
  Administered 2018-04-18 – 2018-04-21 (×8): 5000 [IU] via SUBCUTANEOUS
  Filled 2018-04-18 (×7): qty 1

## 2018-04-18 MED ORDER — LACTATED RINGERS IV SOLN
INTRAVENOUS | Status: DC
  Administered 2018-04-18: 75 mL/h via INTRAVENOUS

## 2018-04-18 MED ORDER — FENTANYL CITRATE (PF) 100 MCG/2ML IJ SOLN
25.0000 ug | INTRAMUSCULAR | Status: DC | PRN
  Administered 2018-04-18 (×3): 50 ug via INTRAVENOUS

## 2018-04-18 MED ORDER — SUCCINYLCHOLINE CHLORIDE 200 MG/10ML IV SOSY
PREFILLED_SYRINGE | INTRAVENOUS | Status: AC
  Filled 2018-04-18: qty 10

## 2018-04-18 MED ORDER — PROPOFOL 10 MG/ML IV BOLUS
INTRAVENOUS | Status: DC | PRN
  Administered 2018-04-18: 200 mg via INTRAVENOUS

## 2018-04-18 MED ORDER — ROCURONIUM BROMIDE 10 MG/ML (PF) SYRINGE
PREFILLED_SYRINGE | INTRAVENOUS | Status: DC | PRN
  Administered 2018-04-18: 20 mg via INTRAVENOUS
  Administered 2018-04-18: 10 mg via INTRAVENOUS
  Administered 2018-04-18: 20 mg via INTRAVENOUS
  Administered 2018-04-18: 50 mg via INTRAVENOUS

## 2018-04-18 MED ORDER — 0.9 % SODIUM CHLORIDE (POUR BTL) OPTIME
TOPICAL | Status: DC | PRN
  Administered 2018-04-18: 2000 mL

## 2018-04-18 MED ORDER — SUGAMMADEX SODIUM 200 MG/2ML IV SOLN
INTRAVENOUS | Status: DC | PRN
  Administered 2018-04-18: 200 mg via INTRAVENOUS

## 2018-04-18 MED ORDER — AMLODIPINE BESYLATE 10 MG PO TABS
10.0000 mg | ORAL_TABLET | Freq: Every day | ORAL | Status: DC
  Administered 2018-04-19 – 2018-04-21 (×3): 10 mg via ORAL
  Filled 2018-04-18 (×3): qty 1

## 2018-04-18 MED ORDER — CARVEDILOL 3.125 MG PO TABS
3.1250 mg | ORAL_TABLET | Freq: Two times a day (BID) | ORAL | Status: DC
  Administered 2018-04-18 – 2018-04-21 (×5): 3.125 mg via ORAL
  Filled 2018-04-18 (×6): qty 1

## 2018-04-18 MED ORDER — HYDRALAZINE HCL 20 MG/ML IJ SOLN
10.0000 mg | INTRAMUSCULAR | Status: DC | PRN
  Administered 2018-04-18 (×2): 10 mg via INTRAVENOUS
  Filled 2018-04-18 (×2): qty 1

## 2018-04-18 MED ORDER — LIDOCAINE 20MG/ML (2%) 15 ML SYRINGE OPTIME
INTRAMUSCULAR | Status: DC | PRN
  Administered 2018-04-18: 1 mg/kg/h via INTRAVENOUS

## 2018-04-18 MED ORDER — ALVIMOPAN 12 MG PO CAPS
12.0000 mg | ORAL_CAPSULE | Freq: Two times a day (BID) | ORAL | Status: DC
  Administered 2018-04-19 (×2): 12 mg via ORAL
  Filled 2018-04-18 (×2): qty 1

## 2018-04-18 MED ORDER — TRAMADOL HCL 50 MG PO TABS
50.0000 mg | ORAL_TABLET | Freq: Four times a day (QID) | ORAL | Status: DC | PRN
  Administered 2018-04-18 – 2018-04-21 (×5): 50 mg via ORAL
  Filled 2018-04-18 (×5): qty 1

## 2018-04-18 MED ORDER — MIDAZOLAM HCL 2 MG/2ML IJ SOLN
INTRAMUSCULAR | Status: AC
  Filled 2018-04-18: qty 2

## 2018-04-18 MED ORDER — ALVIMOPAN 12 MG PO CAPS
12.0000 mg | ORAL_CAPSULE | ORAL | Status: AC
  Administered 2018-04-18: 12 mg via ORAL
  Filled 2018-04-18: qty 1

## 2018-04-18 MED ORDER — LIDOCAINE 2% (20 MG/ML) 5 ML SYRINGE
INTRAMUSCULAR | Status: AC
  Filled 2018-04-18: qty 5

## 2018-04-18 MED ORDER — CEFOTETAN DISODIUM-DEXTROSE 2-2.08 GM-%(50ML) IV SOLR
2.0000 g | INTRAVENOUS | Status: AC
  Administered 2018-04-18: 2 g via INTRAVENOUS
  Filled 2018-04-18: qty 50

## 2018-04-18 MED ORDER — FENTANYL CITRATE (PF) 100 MCG/2ML IJ SOLN
INTRAMUSCULAR | Status: AC
  Filled 2018-04-18: qty 4

## 2018-04-18 MED ORDER — CHLORHEXIDINE GLUCONATE CLOTH 2 % EX PADS: 6.0000 | MEDICATED_PAD | Freq: Once | CUTANEOUS | Status: DC

## 2018-04-18 MED ORDER — LIDOCAINE HCL 2 % IJ SOLN
INTRAMUSCULAR | Status: AC
  Filled 2018-04-18: qty 20

## 2018-04-18 MED ORDER — ONDANSETRON HCL 4 MG/2ML IJ SOLN: 4.0000 mg | Freq: Four times a day (QID) | INTRAMUSCULAR | Status: DC | PRN

## 2018-04-18 MED ORDER — LACTATED RINGERS IV SOLN: INTRAVENOUS | Status: DC

## 2018-04-18 MED ORDER — ONDANSETRON HCL 4 MG/2ML IJ SOLN
INTRAMUSCULAR | Status: DC | PRN
  Administered 2018-04-18: 4 mg via INTRAVENOUS

## 2018-04-18 MED ORDER — IPRATROPIUM BROMIDE 0.03 % NA SOLN
2.0000 | Freq: Three times a day (TID) | NASAL | Status: DC
  Administered 2018-04-18 – 2018-04-19 (×4): 2 via NASAL
  Filled 2018-04-18: qty 30

## 2018-04-18 MED ORDER — MIDAZOLAM HCL 5 MG/5ML IJ SOLN
INTRAMUSCULAR | Status: DC | PRN
  Administered 2018-04-18: 2 mg via INTRAVENOUS

## 2018-04-18 MED ORDER — BUPIVACAINE LIPOSOME 1.3 % IJ SUSP
20.0000 mL | Freq: Once | INTRAMUSCULAR | Status: AC
Start: 2018-04-18 — End: 2018-04-18
  Administered 2018-04-18: 20 mL
  Filled 2018-04-18: qty 20

## 2018-04-18 MED ORDER — LIDOCAINE 2% (20 MG/ML) 5 ML SYRINGE
INTRAMUSCULAR | Status: DC | PRN
  Administered 2018-04-18: 100 mg via INTRAVENOUS

## 2018-04-18 MED ORDER — SUGAMMADEX SODIUM 200 MG/2ML IV SOLN
INTRAVENOUS | Status: AC
  Filled 2018-04-18: qty 4

## 2018-04-18 MED ORDER — FENTANYL CITRATE (PF) 100 MCG/2ML IJ SOLN
INTRAMUSCULAR | Status: AC
  Filled 2018-04-18: qty 2

## 2018-04-18 MED ORDER — ACETAMINOPHEN 500 MG PO TABS
1000.0000 mg | ORAL_TABLET | Freq: Four times a day (QID) | ORAL | Status: AC
  Administered 2018-04-18 – 2018-04-19 (×3): 1000 mg via ORAL
  Filled 2018-04-18 (×3): qty 2

## 2018-04-18 MED ORDER — HYDROMORPHONE HCL 1 MG/ML IJ SOLN
0.5000 mg | INTRAMUSCULAR | Status: DC | PRN
  Administered 2018-04-18 (×2): 0.5 mg via INTRAVENOUS
  Filled 2018-04-18 (×2): qty 0.5

## 2018-04-18 MED ORDER — GLYCOPYRROLATE 0.2 MG/ML IV SOSY
PREFILLED_SYRINGE | INTRAVENOUS | Status: AC
  Filled 2018-04-18: qty 5

## 2018-04-18 MED ORDER — FENTANYL CITRATE (PF) 100 MCG/2ML IJ SOLN
INTRAMUSCULAR | Status: DC | PRN
  Administered 2018-04-18 (×2): 50 ug via INTRAVENOUS

## 2018-04-18 MED ORDER — GABAPENTIN 300 MG PO CAPS
300.0000 mg | ORAL_CAPSULE | ORAL | Status: AC
  Administered 2018-04-18: 300 mg via ORAL
  Filled 2018-04-18: qty 1

## 2018-04-18 MED ORDER — LACTATED RINGERS IR SOLN
Status: DC | PRN
  Administered 2018-04-18: 1000 mL

## 2018-04-18 MED ORDER — KETOROLAC TROMETHAMINE 30 MG/ML IJ SOLN
15.0000 mg | INTRAMUSCULAR | Status: AC
  Administered 2018-04-18: 15 mg via INTRAVENOUS
  Filled 2018-04-18: qty 1

## 2018-04-18 MED ORDER — METOCLOPRAMIDE HCL 5 MG/ML IJ SOLN: 10.0000 mg | Freq: Once | INTRAMUSCULAR | Status: DC | PRN

## 2018-04-18 MED ORDER — LISINOPRIL 20 MG PO TABS
20.0000 mg | ORAL_TABLET | Freq: Every day | ORAL | Status: DC
  Administered 2018-04-19 – 2018-04-21 (×3): 20 mg via ORAL
  Filled 2018-04-18 (×3): qty 1

## 2018-04-18 MED ORDER — ROCURONIUM BROMIDE 10 MG/ML (PF) SYRINGE
PREFILLED_SYRINGE | INTRAVENOUS | Status: AC
  Filled 2018-04-18: qty 5

## 2018-04-18 MED ORDER — INSULIN ASPART 100 UNIT/ML ~~LOC~~ SOLN
0.0000 [IU] | Freq: Three times a day (TID) | SUBCUTANEOUS | Status: DC
  Administered 2018-04-18: 2 [IU] via SUBCUTANEOUS
  Administered 2018-04-19: 1 [IU] via SUBCUTANEOUS

## 2018-04-18 MED ORDER — GLYCOPYRROLATE 0.2 MG/ML IV SOSY
PREFILLED_SYRINGE | INTRAVENOUS | Status: DC | PRN
  Administered 2018-04-18: 0.4 mg via INTRAVENOUS

## 2018-04-18 MED ORDER — ONDANSETRON HCL 4 MG PO TABS: 4.0000 mg | ORAL_TABLET | Freq: Four times a day (QID) | ORAL | Status: DC | PRN

## 2018-04-18 SURGICAL SUPPLY — 79 items
ADH SKN CLS APL DERMABOND .7 (GAUZE/BANDAGES/DRESSINGS) ×2
APPLIER CLIP 5 13 M/L LIGAMAX5 (MISCELLANEOUS)
APPLIER CLIP ROT 10 11.4 M/L (STAPLE)
APR CLP MED LRG 11.4X10 (STAPLE)
APR CLP MED LRG 5 ANG JAW (MISCELLANEOUS)
BLADE EXTENDED COATED 6.5IN (ELECTRODE) IMPLANT
CABLE HIGH FREQUENCY MONO STRZ (ELECTRODE) ×4 IMPLANT
CATH MUSHROOM 28FR (CATHETERS) IMPLANT
CATH MUSHROOM 30FR (CATHETERS) ×4 IMPLANT
CELLS DAT CNTRL 66122 CELL SVR (MISCELLANEOUS) IMPLANT
CLIP APPLIE 5 13 M/L LIGAMAX5 (MISCELLANEOUS) IMPLANT
CLIP APPLIE ROT 10 11.4 M/L (STAPLE) IMPLANT
DERMABOND ADVANCED (GAUZE/BANDAGES/DRESSINGS) ×2
DERMABOND ADVANCED .7 DNX12 (GAUZE/BANDAGES/DRESSINGS) ×2 IMPLANT
DISSECTOR BLUNT TIP ENDO 5MM (MISCELLANEOUS) IMPLANT
DRAIN CHANNEL 19F RND (DRAIN) IMPLANT
DRAPE SURG IRRIG POUCH 19X23 (DRAPES) ×4 IMPLANT
DRSG OPSITE POSTOP 4X10 (GAUZE/BANDAGES/DRESSINGS) IMPLANT
DRSG OPSITE POSTOP 4X6 (GAUZE/BANDAGES/DRESSINGS) IMPLANT
DRSG OPSITE POSTOP 4X8 (GAUZE/BANDAGES/DRESSINGS) ×4 IMPLANT
ELECT PENCIL ROCKER SW 15FT (MISCELLANEOUS) ×4 IMPLANT
ELECT REM PT RETURN 15FT ADLT (MISCELLANEOUS) ×4 IMPLANT
ELECT REM PT RETURN 9FT ADLT (ELECTROSURGICAL) ×4
ELECTRODE REM PT RTRN 9FT ADLT (ELECTROSURGICAL) ×2 IMPLANT
EVACUATOR SILICONE 100CC (DRAIN) IMPLANT
GAUZE SPONGE 4X4 12PLY STRL (GAUZE/BANDAGES/DRESSINGS) IMPLANT
GLOVE BIO SURGEON STRL SZ7.5 (GLOVE) ×14 IMPLANT
GLOVE INDICATOR 8.0 STRL GRN (GLOVE) ×12 IMPLANT
GOWN STRL REUS W/TWL XL LVL3 (GOWN DISPOSABLE) ×22 IMPLANT
HOLDER FOLEY CATH W/STRAP (MISCELLANEOUS) ×4 IMPLANT
LIGASURE IMPACT 36 18CM CVD LR (INSTRUMENTS) IMPLANT
NEEDLE INSUFFLATION 14GA 120MM (NEEDLE) IMPLANT
PACK COLON (CUSTOM PROCEDURE TRAY) ×4 IMPLANT
PAD POSITIONING PINK XL (MISCELLANEOUS) ×4 IMPLANT
PORT LAP GEL ALEXIS MED 5-9CM (MISCELLANEOUS) ×3 IMPLANT
RELOAD STAPLER BLUE 60MM (STAPLE) ×6 IMPLANT
RELOAD STAPLER WHITE 60MM (STAPLE) ×2 IMPLANT
RTRCTR WOUND ALEXIS 18CM MED (MISCELLANEOUS)
SCISSORS LAP 5X35 DISP (ENDOMECHANICALS) ×4 IMPLANT
SEALER TISSUE G2 STRG ARTC 35C (ENDOMECHANICALS) ×4 IMPLANT
SET IRRIG TUBING LAPAROSCOPIC (IRRIGATION / IRRIGATOR) ×4 IMPLANT
SLEEVE ADV FIXATION 5X100MM (TROCAR) ×8 IMPLANT
SPONGE DRAIN TRACH 4X4 STRL 2S (GAUZE/BANDAGES/DRESSINGS) IMPLANT
STAPLER CUT CVD 40MM BLUE (STAPLE) ×4 IMPLANT
STAPLER ECHELON LONG 60 440 (INSTRUMENTS) ×2 IMPLANT
STAPLER RELOAD BLUE 60MM (STAPLE) ×12
STAPLER RELOAD WHITE 60MM (STAPLE) ×4
STAPLER TA28 THK CONTR EEA XL (STAPLE) ×4 IMPLANT
STAPLER VISISTAT 35W (STAPLE) IMPLANT
SUT ETHILON 3 0 PS 1 (SUTURE) IMPLANT
SUT MNCRL AB 4-0 PS2 18 (SUTURE) ×12 IMPLANT
SUT PDS AB 1 CTX 36 (SUTURE) IMPLANT
SUT PDS AB 1 TP1 54 (SUTURE) IMPLANT
SUT PDS AB 1 TP1 96 (SUTURE) IMPLANT
SUT PROLENE 2 0 KS (SUTURE) ×4 IMPLANT
SUT PROLENE 2 0 SH DA (SUTURE) ×4 IMPLANT
SUT SILK 2 0 (SUTURE) ×4
SUT SILK 2 0 SH CR/8 (SUTURE) ×4 IMPLANT
SUT SILK 2-0 18XBRD TIE 12 (SUTURE) ×2 IMPLANT
SUT SILK 3 0 (SUTURE) ×4
SUT SILK 3 0 SH CR/8 (SUTURE) ×4 IMPLANT
SUT SILK 3-0 18XBRD TIE 12 (SUTURE) ×2 IMPLANT
SUT VIC AB 2-0 SH 27 (SUTURE)
SUT VIC AB 2-0 SH 27X BRD (SUTURE) IMPLANT
SUT VIC AB 3-0 SH 18 (SUTURE) IMPLANT
SUT VIC AB 3-0 SH 27 (SUTURE)
SUT VIC AB 3-0 SH 27X BRD (SUTURE) IMPLANT
SUT VICRYL 2 0 18  UND BR (SUTURE) ×2
SUT VICRYL 2 0 18 UND BR (SUTURE) ×2 IMPLANT
SYS LAPSCP GELPORT 120MM (MISCELLANEOUS)
SYSTEM LAPSCP GELPORT 120MM (MISCELLANEOUS) IMPLANT
TOWEL OR 17X26 10 PK STRL BLUE (TOWEL DISPOSABLE) IMPLANT
TOWEL OR NON WOVEN STRL DISP B (DISPOSABLE) ×4 IMPLANT
TRAY FOLEY MTR SLVR 14FR STAT (SET/KITS/TRAYS/PACK) ×3 IMPLANT
TRAY FOLEY MTR SLVR 16FR STAT (SET/KITS/TRAYS/PACK) IMPLANT
TROCAR ADV FIXATION 12X100MM (TROCAR) ×4 IMPLANT
TROCAR ADV FIXATION 5X100MM (TROCAR) ×4 IMPLANT
TROCAR XCEL BLUNT TIP 100MML (ENDOMECHANICALS) IMPLANT
TUBING INSUF HEATED (TUBING) ×4 IMPLANT

## 2018-04-18 NOTE — Op Note (Signed)
PATIENT: [name]  67 y.o. female  Patient Care Team: Copland, Gay Filler, MD as PCP - General (Family Medicine)  PREOP DIAGNOSIS: COLON CANCER  POSTOP DIAGNOSIS: COLON CANCER  PROCEDURE: 1. Laparoscopic low anterior resection 2. Flexible sigmoidoscopy  SURGEON: Sharon Mt. Dema Severin, MD  ASSISTANT: Michael Boston, MD  ANESTHESIA: General endotracheal  EBL: 50cc Total I/O In: 5397 [I.V.:2300] Out: 250 [Urine:200; Blood:50]  DRAINS: None  SPECIMEN: 1. Sigmoid colon - staple line marks distal end 2. Proximal donut (stitch marks this and its on anvil) 3. Distal donut (no stitch)  COUNTS: Sponge, needle and instrument counts were reported correct x2  FINDINGS:  No obvious metastatic disease on visceral parietal peritoneum or liver.  Tattoo was not readily apparent on diagnostic laparoscopy.  A flexible sigmoidoscopy was therefore performed.  The tattoo was identified intraluminally and location confirmed with the laparoscope as where the colonoscope was located.  The distal tattoo was in the rectosigmoid colon.  Given this, a low anterior resection was performed with a double stapled descending colon to proximal rectum anastomosis. The anastomosis is located ~12 cm from the anal verge by flexible sigmoidoscopy.  STATEMENT OF MEDICAL NECESSITY: [name] is a pleasant 59yoF with hx of HTN, HLD, DM underwent a screening colonoscopy by Dr. Ardis Hughs 02/16/18 which showed: 2, 2-4 mm polyps in the transverse and ascending colon-tubular adenomas removed A 17 mm semi-pedunculated polyp in the sigmoid colon was removed with hot snare and tattooed-estimated to be 30 cm from anal verge - pathology returned invasive adenocarcinoma, moderately differentiated.  She subsequently underwent staging CT chest/abdomen/pelvis which did not reveal any evidence of metastatic disease. Her CEA came back <0.5 In retrospect, for the past couple of months she has noted intermittent bright  red blood in the toilet bowl that she attributed to history of hemorrhoids. Her last colonoscopy prior to this one was in 2008 and she was told it was normal and have a repeat colonoscopy in 10 years  -I discussed the case with the reading pathologist, Dr. Vic Ripper and there was extensive tumor involvement of the specimen beneath the surface, extending to the margin of the resection. -The anatomy and physiology of the GI tract was discussed at length with the patient. The pathophysiology of colon polyps and cancer was discussed at length with associated pictures and illustrations. -We discussed that although this was removed there was tumor extending to the edge of the resection and there was no muscle seen by the reading pathologist, suggesting that this likely is involving at least the entire submucosa and possibly extending further but the actual depth of assessment cannot be clearly made given that no muscularis was seen. Therefore, I have recommended partial colectomy to further address. -Will plan laparoscopic vs open segmental (likely sigmoid) colectomy; flex sig; all other indicated procedures -We discussed the technical approach the procedure, namely that intraoperative assessment of the location of the lesion would be made based over the tattoo is the operation tailored to this. The planned procedure, material risks (including, but not limited to, pain, bleeding, infection, scarring, need for blood transfusion, damage to surrounding structures- blood vessels/nerves/viscus/organs, damage to ureter, urine leak, leak from anastomosis, need for additional procedures, need for stoma which may be permanent, hernia, recurrence, pneumonia, heart attack, stroke, death) benefits and alternatives to surgery were discussed at length. The patient's questions were answered to her satisfaction, she voiced understanding and she elected to proceed with surgery.  NARRATIVE: Informed consent was verified. The  patient was taken  to the operating room, placed supine on the operating table and SCD's were applied. General endotracheal anesthesia was induced. The patient was then positioned in the lithotomy position with Allen stirrups and a foley was placed. Hair in the region of the planned surgery was clipped. The patient's abdomen was then prepped and draped in the standard sterile fashion. Surgical timeout confirmed our plan. An OG was in place and confirmed to be working.  A left upper quadrant stab incision was made at Palmer's point.  A Veress needle was introduced in the peritoneal cavity at this location and intraperitoneal location confirmed by aspiration as well as a saline drop test.  Insufflation commenced to a pressure of 15 mmHg with CO2.  A 5 mm trocar was then inserted into the left upper quadrant under direct visualization using Optiview technique.  The lap scope was inserted into the perineal cavity and inspection revealed no evidence of trocar site complication.   Three additional 31mm ports were carefully placed under direct laparoscopic visualization - two in the right abdomen and an additional one in the left. A 09FG umbilical trocar was also placed.   The patient was positioned in Trendelenburg. The omentum and small bowel was reflected cephalad.  A lateral to medial approach was utilized initially given prior pelvic surgery to expose the tattoo and free a loop of sigmoid which was adherent in her pelvis.  Care was taken to preserve the bladder free of injury.  After this loop of sigmoid had been mobilized out of the pelvis, the tattoo was not readily identified.  Given this, a flexible sigmoidoscopy was performed.  The descending colon was clamped with an atraumatic grasper.  The sigmoidoscope was passed and location of the tattoo confirmed to be at the rectosigmoid colon.  We used lap scopic visualization to pinpoint the location of the colonoscope and found at the spot.  The tattoo appeared to  be in the mesenteric border of the bowel wall but was identified intraluminally at this location and was found to be the distal tattoo.  There was no visible intraluminal lesion. The sigmoidoscope was passed into the descending colon, up to the splenic flexure - terminating at 70cm and no additional tattoo was found.   The medial to lateral approach was then utilized.  The proximal rectum and sigmoid colon were elevated and the peritoneum incised this level.  Working within the avascular plane overlying the sacral promontory, the dissection was developed working laterally.  The left ureter was readily identified and protected free of injury.  The gonadal vessels were also visualized and swept down, protected free of injury.  The avascular plane was developed proximally up towards the splenic flexure.  The plane was developed distally down to the level of the mid rectum.  Then working from a lateral approach, the Timiyah Romito line of Toldt was sharply dissected up to the level of the splenic flexure and down to the level of the mid rectum.  The peritoneum overlying the IMA was scored and the peritoneum opened. The IMA was isolated and location of the left ureter confirmed to be down and posterior to where the plane of dissection was located.  The bifurcation of the IMA off of the aorta was visible and at this level, the ureter was clearly down and free.  The IMA was then divided with a Endo GIA Raguel Kosloski load stapler.  The pedicle was inspected and noted to be hemostatic. Adequate length of colon was confirmed with the distal descending colon  easily reaching into the pelvis without any tension.  Hemostasis was then again verified.  The proximal mesorectum was then circumferentially dissected. At this level, the tattoo was approximately 5 cm proximal to the planned point of transection.  After all of the associated mesorectum had been cleared, the proximal rectum was then divided using an Endo GIA blue load stapler.  The  sigmoid colon was clamped with a grasper.   Attention was then turned to extracorporeal portion of the procedure.  A Pfannenstiel incision was made approximately 2 finger breadths above the pubic symphysis and carried down to the anterior rectus fascia.  The pyramidalis muscle was divided.  The anterior rectus fascia was then opened transversely.  Kocher clamps were placed on the fascia and the fibroconnective attachments to the anterior rectus fascia were taken down with electrocautery.  Hemostasis was verified.  The midline peritoneum was then incised and the peritoneal cavity entered with pneumoperitoneum established.  Care was taken to protect the bladder from injury during opening of the peritoneum.  An [place] wound protector was then placed.  The sigmoid colon was grasped and eviscerated from the peritoneal cavity.  The proximal site of transection was at the level of the distal descending colon - a pursestring device was applied to the distal descending colon and a 2-0 Prolene passed through the device using a Keith needle.  The colon was divided and the sigmoid colon was passed off the specimen - open end being proximal, stapled end being distal.  EEA sizers were then used in the distal descending colon would not accommodate a 31 mm EEA sizer but did accommodate a 28 mm sizer.  For this reason, a 28 mm EEA was utilized.  The anvil was placed and the pursestring tied.  A 28 mm EEA sizer was carefully passed up the rectum under direct visualization.  The EEA stapler was then passed and the spike deployed just anterior to the staple line.  The components were mated and orientation of the proximal limb of colon confirmed there was no twisting of the colon nor mesentery. The two ends reached one another without any tension. No small bowel was under the cut edge of the mesentery either. Care was taken to ensure no other structures were included in the staple line. The anastomosis was then created. Attention was  then turned to performing a leak test.  The colon proximal to the circular anastomosis was occluded and a flexible sigmoidoscopy was performed. The pelvis was filled with sterile saline. The scope was passed. Both ends of the bowel at the level of the anastomosis were pink, hemostatic and air tight.  The irrigation was evacuated.  Hemostasis was again verified. Omentum was the placed down in the pelvis.  The Alexis wound protector and 5 mm ports were all removed.  Attention was then turned to closing the abdomen.  The abdomen was redraped with sterile drapes.  All equipment was exchanged for clean equipment.  Gloves and gowns of all scrubbed participants were changed.    The umbilical trocar site which is a 12 mm port was closed with a laparoscopic suture passer under direct visualization from the Pfannenstiel incision using a 0 Vicryl suture.  This was done in figure-of-eight fashion.  The peritoneum was closed with a running 2-0 Vicryl suture.  The rectus fascia was then approximated with running #1 PDS suture.  The skin of all incision sites was closed with 4-0 Monocryl subcuticular sutures.  Dermabond was applied to all incisions and  a sterile dressing placed over the Pfannenstiel incision. The patient was then awakened from general anesthesia, extubated and transferred to a stretcher for transport to PACU in satisfactory condition.   An MD assistant was necessary for tissue manipulation, retraction and positioning due to the complexity of the case, obesity of the patient and hospital policies  DISPOSITION: PACU in satisfactory condition  Note: This dictation was prepared with Dragon/digital dictation along with [employer]. Any transcriptional errors that result from this process are unintentional.

## 2018-04-18 NOTE — H&P (Signed)
CC: Referral by Dr. Ardis Hughs for newly diagnosed colon cancer    HPI: Ms. [name] is a pleasant 60yoF with hx of HTN, HLD, DM underwent a screening colonoscopy by Dr. Ardis Hughs 02/16/18 which showed: 2, 2-4 mm polyps in the transverse and ascending colon-tubular adenomas removed A 17 mm semi-pedunculated polyp in the sigmoid colon was removed with hot snare and tattooed-estimated to be 30 cm from anal verge - pathology returned invasive adenocarcinoma, moderately differentiated.  She subsequently underwent staging CT chest/abdomen/pelvis which did not reveal any evidence of metastatic disease. Her CEA came back <0.5 In retrospect, for the past couple of months she has noted intermittent bright red blood in the toilet bowl that she attributed to history of hemorrhoids. Her last colonoscopy prior to this one was in 2008 and she was told it was normal and have a repeat colonoscopy in 10 years She denies any complaints today. She denies abdominal pain, fever, chills, nausea, vomiting, or diarrhea.   Past Medical History:  Diagnosis Date  . Allergy   . Anemia   . Anxiety   . Arthritis   . Asthma   . Back pain   . Blood transfusion without reported diagnosis   . Cancer of sigmoid colon ([place])   . Constipation   . Depression   . Diabetes mellitus without complication ([place])   . Dyspnea    with exertion  . Fatigue   . Fatty liver    mild  . GERD (gastroesophageal reflux disease)   . Grade I diastolic dysfunction    Echo 08/08/2017 with Grade I DD, EF 51%, Mild Aortic regurgitation, Mild Tricuspid Regurgitation, Pulmonary pressure 30 mm Hg. She denies any chest pain, dyspnea, or peripheral edema.  . Headaches due to old head injury   . History of stomach ulcers   . Hyperlipidemia   . Hypertension   . Lactose intolerance   . Left hip pain   . Left thyroid nodule   . Lipoma of extremity 10/17/2017   Left lower  . Migraines   . Obesity   . TBI (traumatic brain injury) ([place])   .  Thyroid disease   . Vitamin D deficiency     Past Surgical History:  Procedure Laterality Date  . ABDOMINAL HYSTERECTOMY    . BREAST LUMPECTOMY WITH RADIOACTIVE SEED LOCALIZATION Left 08/23/2016   Procedure: LEFT BREAST LUMPECTOMY WITH RADIOACTIVE SEED LOCALIZATION;  Surgeon: Rolm Bookbinder, MD;  Location: [facility];  Service: General;  Laterality: Left;  . BREAST SURGERY  2003   reduction  . CESAREAN SECTION    . CHALAZION EXCISION Left 2010  . COLONOSCOPY W/ POLYPECTOMY  02/16/2018   Dr. Ardis Hughs  . FACET JOINT INJECTION  2011 and 2012  . MYOMECTOMY    . NM MYOCAR PERF WALL MOTION  03/2009   dipyridamole; normal pattern of perfusion in all regions, post-stress EF 65%, normal study   . THYROIDECTOMY  1989   partial  . TRANSTHORACIC ECHOCARDIOGRAM  03/2009   EF=>55%, borderline conc LVH; mild mitral annular calcif, trace MR; trace TR; AV mildly sclerotic, mild AV regurg; mild pulm valve regurg   . TUBAL LIGATION    PSH: C-section via Pfannenstiel; myomectomy 2; hysterectomy via low midline incision; thyroid surgery for nodule she was told was benign; breast surgery (benign ducts on path)   Family History  Problem Relation Age of Onset  . Heart disease Mother        MI, HTN  . Hypertension Mother   .  Thyroid disease Mother   . Hyperlipidemia Mother   . Sudden death Mother   . Anxiety disorder Mother   . Heart disease Maternal Grandmother        MI, CVA  . Heart disease Maternal Grandfather        MI  . Stroke Maternal Grandfather   . Heart disease Paternal Grandmother   . Stroke Paternal Grandmother   . Heart disease Paternal Grandfather        MI  . Glaucoma Father   . Diabetes Father   . Depression Daughter   . ADD / ADHD Son   . Thyroid disease Sister   . Heart attack Paternal Uncle        HTN  . Hypertension Paternal Uncle   . Colon cancer Neg Hx   . Stomach cancer Neg Hx   . Esophageal cancer Neg Hx     Social:  reports that she has never  smoked. She has never used smokeless tobacco. She reports that she drinks alcohol. She reports that she does not use drugs.  Allergies:  Allergies  Allergen Reactions  . Hydrochlorothiazide Other (See Comments)    Increases risk of gout flares   . Botox [Botulinum Toxin Type A] Swelling  . Onabotulinumtoxina Swelling  . Toprol Xl [Metoprolol Succinate] Hives    Medications: I have reviewed the patient's current medications.  Results for orders placed or performed during the hospital encounter of 04/18/18 (from the past 48 hour(s))  Glucose, capillary     Status: Abnormal   Collection Time: 04/18/18  5:45 AM  Result Value Ref Range   Glucose-Capillary 175 (H) 65 - 99 mg/dL    No results found.  ROS - all of the below systems have been reviewed with the patient and positives are indicated with bold text General: chills, fever or night sweats Eyes: blurry vision or double vision ENT: epistaxis or sore throat Allergy/Immunology: itchy/watery eyes or nasal congestion Hematologic/Lymphatic: bleeding problems, blood clots or swollen lymph nodes Endocrine: temperature intolerance or unexpected weight changes Breast: new or changing breast lumps or nipple discharge Resp: cough, shortness of breath, or wheezing CV: chest pain or dyspnea on exertion GI: as per HPI GU: dysuria, trouble voiding, or hematuria MSK: joint pain or joint stiffness Neuro: TIA or stroke symptoms Derm: pruritus and skin lesion changes Psych: anxiety and depression  PE Blood pressure (!) 164/94, pulse 70, temperature 98 F (36.7 C), temperature source Oral, resp. rate 16, height 5\' 3"  (1.6 m), weight 93.9 kg (207 lb), SpO2 100 %. Constitutional: NAD; conversant; no deformities Eyes: Moist conjunctiva; no lid lag; anicteric; PERRL Neck: Trachea midline; no thyromegaly Lungs: Normal respiratory effort; no tactile fremitus CV: RRR; no palpable thrills; no pitting edema GI: Abd soft, NT/ND; no palpable  hepatosplenomegaly MSK: Normal gait; no clubbing/cyanosis Psychiatric: Appropriate affect; alert and oriented x3 Lymphatic: No palpable cervical or axillary lymphadenopathy  Results for orders placed or performed during the hospital encounter of 04/18/18 (from the past 48 hour(s))  Glucose, capillary     Status: Abnormal   Collection Time: 04/18/18  5:45 AM  Result Value Ref Range   Glucose-Capillary 175 (H) 65 - 99 mg/dL    A/P: Ms. [name] is a pleasant 72yoF with hx of HTN, HLD, DM here today for evaluation of invasive cancer found on polpyectomy specimen of 1.7cm polyp removed from sigmoid colon (~30cm from anal verge, tattoo'd). CEA <0.5. Staging CT C/A/P revealed no evidence of metastatic disease  -I discussed the  case with the reading pathologist, Dr. Vic Ripper and there was extensive tumor involvement of the specimen beneath the surface, extending to the margin of the resection. -The anatomy and physiology of the GI tract was discussed at length with the patient. The pathophysiology of colon polyps and cancer was discussed at length with associated pictures and illustrations. -We discussed that although this was removed there was tumor extending to the edge of the resection and there was no muscle seen by the reading pathologist, suggesting that this likely is involving at least the entire submucosa and possibly extending further but the actual depth of assessment cannot be clearly made given that no muscularis was seen. Therefore, I have recommended partial colectomy to further address. -Will plan laparoscopic vs open segmental (likely sigmoid) colectomy; flex sig; all other indicated procedures -We discussed the technical approach the procedure, namely that intraoperative assessment of the location of the lesion would be made based over the tattoo is the operation tailored to this. The planned procedure, material risks (including, but not limited to, pain, bleeding, infection,  scarring, need for blood transfusion, damage to surrounding structures- blood vessels/nerves/viscus/organs, damage to ureter, urine leak, leak from anastomosis, need for additional procedures, need for stoma which may be permanent, hernia, recurrence, pneumonia, heart attack, stroke, death) benefits and alternatives to surgery were discussed at length. The patient's questions were answered to her satisfaction, she voiced understanding and she elected to proceed with surgery.  Sharon Mt. Dema Severin, M.D. General and Colorectal Surgery [facility]

## 2018-04-18 NOTE — Anesthesia Postprocedure Evaluation (Signed)
Anesthesia Post Note  Patient: ALIANYS CHACKO  Procedure(s) Performed: LAPAROSCOPIC ASSISTED SIGMOIDECTOMY (N/A Abdomen) FLEXIBLE SIGMOIDOSCOPY (N/A Rectum)     Patient location during evaluation: PACU Anesthesia Type: General Level of consciousness: awake and alert Pain management: pain level controlled Vital Signs Assessment: post-procedure vital signs reviewed and stable Respiratory status: spontaneous breathing, nonlabored ventilation, respiratory function stable and patient connected to nasal cannula oxygen Cardiovascular status: blood pressure returned to baseline and stable Postop Assessment: no apparent nausea or vomiting Anesthetic complications: no    Last Vitals:  Vitals:   04/18/18 1400 04/18/18 1510  BP: (!) 179/90 (!) 188/69  Pulse: (!) 56 (!) 58  Resp: 18 15  Temp: 36.6 C 36.5 C  SpO2: 97% 98%    Last Pain:  Vitals:   04/18/18 1525  TempSrc:   PainSc: 10-Worst pain ever                 Montez Hageman

## 2018-04-18 NOTE — Anesthesia Preprocedure Evaluation (Signed)
Anesthesia Evaluation  Patient identified by MRN, date of birth, ID band Patient awake    Reviewed: Allergy & Precautions, NPO status , Patient's Chart, lab work & pertinent test results  History of Anesthesia Complications Negative for: history of anesthetic complications  Airway Mallampati: III  TM Distance: >3 FB Neck ROM: Full    Dental no notable dental hx. (+) Dental Advisory Given   Pulmonary neg pulmonary ROS,    Pulmonary exam normal breath sounds clear to auscultation       Cardiovascular hypertension, Pt. on medications Normal cardiovascular exam Rhythm:Regular Rate:Normal     Neuro/Psych  Headaches, PSYCHIATRIC DISORDERS Anxiety Depression    GI/Hepatic Neg liver ROS, GERD  Controlled,  Endo/Other  diabetes, Type 2, Oral Hypoglycemic Agentsobesity  Renal/GU negative Renal ROS  negative genitourinary   Musculoskeletal negative musculoskeletal ROS (+)   Abdominal (+) + obese,   Peds negative pediatric ROS (+)  Hematology negative hematology ROS (+)   Anesthesia Other Findings   Reproductive/Obstetrics negative OB ROS                            Anesthesia Physical  Anesthesia Plan  ASA: II  Anesthesia Plan: General   Post-op Pain Management:    Induction: Intravenous  PONV Risk Score and Plan: 3 and Ondansetron, Dexamethasone, Midazolam and Treatment may vary due to age or medical condition  Airway Management Planned: Oral ETT  Additional Equipment:   Intra-op Plan:   Post-operative Plan: Extubation in OR  Informed Consent: I have reviewed the patients History and Physical, chart, labs and discussed the procedure including the risks, benefits and alternatives for the proposed anesthesia with the patient or authorized representative who has indicated his/her understanding and acceptance.   Dental advisory given  Plan Discussed with: CRNA  Anesthesia Plan  Comments:        Anesthesia Quick Evaluation

## 2018-04-18 NOTE — Transfer of Care (Signed)
Immediate Anesthesia Transfer of Care Note  Patient: [name]  Procedure(s) Performed: LAPAROSCOPIC ASSISTED SIGMOIDECTOMY (N/A Abdomen) FLEXIBLE SIGMOIDOSCOPY (N/A Rectum)  Patient Location: PACU  Anesthesia Type:General  Level of Consciousness: sedated  Airway & Oxygen Therapy: Patient Spontanous Breathing and Patient connected to face mask oxygen  Post-op Assessment: Report given to RN and Post -op Vital signs reviewed and stable  Post vital signs: Reviewed and stable  Last Vitals:  Vitals Value Taken Time  BP    Temp    Pulse 61 04/18/2018 11:43 AM  Resp    SpO2 99 % 04/18/2018 11:43 AM  Vitals shown include unvalidated device data.  Last Pain:  Vitals:   04/18/18 0624  TempSrc:   PainSc: 0-No pain         Complications: No apparent anesthesia complications

## 2018-04-18 NOTE — Anesthesia Procedure Notes (Signed)
Procedure Name: Intubation Date/Time: 04/18/2018 7:25 AM Performed by: Lind Covert, CRNA Pre-anesthesia Checklist: Patient identified, Emergency Drugs available, Suction available, Patient being monitored and Timeout performed Patient Re-evaluated:Patient Re-evaluated prior to induction Oxygen Delivery Method: Circle system utilized Preoxygenation: Pre-oxygenation with 100% oxygen Induction Type: IV induction Ventilation: Mask ventilation without difficulty Laryngoscope Size: Mac and 4 Grade View: Grade I Tube type: Oral Tube size: 7.0 mm Number of attempts: 1 Airway Equipment and Method: Stylet Placement Confirmation: ETT inserted through vocal cords under direct vision,  positive ETCO2 and breath sounds checked- equal and bilateral Secured at: 22 cm Tube secured with: Tape Dental Injury: Teeth and Oropharynx as per pre-operative assessment

## 2018-04-19 ENCOUNTER — Encounter: Payer: Self-pay | Admitting: Family Medicine

## 2018-04-19 LAB — BASIC METABOLIC PANEL
Anion gap: 11 (ref 5–15)
BUN: 9 mg/dL (ref 6–20)
CALCIUM: 8.9 mg/dL (ref 8.9–10.3)
CO2: 25 mmol/L (ref 22–32)
CREATININE: 0.78 mg/dL (ref 0.44–1.00)
Chloride: 103 mmol/L (ref 101–111)
GFR calc non Af Amer: >60 mL/min (ref >60)
GLUCOSE: 141 mg/dL — AB (ref 65–99)
Potassium: 3.3 mmol/L — ABNORMAL LOW (ref 3.5–5.1)
Sodium: 139 mmol/L (ref 135–145)

## 2018-04-19 LAB — GLUCOSE, CAPILLARY
GLUCOSE-CAPILLARY: 92 mg/dL (ref 65–99)
Glucose-Capillary: 125 mg/dL — ABNORMAL HIGH (ref 65–99)
Glucose-Capillary: 104 mg/dL — ABNORMAL HIGH (ref 65–99)
Glucose-Capillary: 114 mg/dL — ABNORMAL HIGH (ref 65–99)

## 2018-04-19 LAB — PHOSPHORUS: Phosphorus: 2.9 mg/dL (ref 2.5–4.6)

## 2018-04-19 LAB — CBC
HCT: 37.2 % (ref 36.0–46.0)
Hemoglobin: 12.5 g/dL (ref 12.0–15.0)
MCH: 28.9 pg (ref 26.0–34.0)
MCHC: 33.6 g/dL (ref 30.0–36.0)
MCV: 86.1 fL (ref 78.0–100.0)
PLATELETS: 310 10*3/uL (ref 150–400)
RBC: 4.32 MIL/uL (ref 3.87–5.11)
RDW: 13.6 % (ref 11.5–15.5)
WBC: 11.4 10*3/uL — ABNORMAL HIGH (ref 4.0–10.5)

## 2018-04-19 LAB — MAGNESIUM: MAGNESIUM: 1.7 mg/dL (ref 1.7–2.4)

## 2018-04-19 MED ORDER — METFORMIN HCL 500 MG PO TABS
500.0000 mg | ORAL_TABLET | Freq: Every day | ORAL | Status: DC
  Administered 2018-04-19 – 2018-04-21 (×3): 500 mg via ORAL
  Filled 2018-04-19 (×3): qty 1

## 2018-04-19 MED ORDER — MAGNESIUM SULFATE 2 GM/50ML IV SOLN
2.0000 g | Freq: Once | INTRAVENOUS | Status: AC
  Administered 2018-04-19: 2 g via INTRAVENOUS
  Filled 2018-04-19: qty 50

## 2018-04-19 MED ORDER — POTASSIUM CHLORIDE CRYS ER 20 MEQ PO TBCR
40.0000 meq | EXTENDED_RELEASE_TABLET | Freq: Once | ORAL | Status: AC
  Administered 2018-04-19: 40 meq via ORAL
  Filled 2018-04-19: qty 2

## 2018-04-19 NOTE — Progress Notes (Addendum)
Subjective No acute events. Has been up and out of bed 3x since surgery. Tolerating clears without n/v. Pain controlled, although still sore. Denies flatus/BM yet  Objective: Vital signs in last 24 hours: Temp:  [97.5 F (36.4 C)-98.4 F (36.9 C)] 97.5 F (36.4 C) (05/23 0606) Pulse Rate:  [56-76] 56 (05/23 0606) Resp:  [10-18] 15 (05/23 0606) BP: (132-190)/(62-96) 132/65 (05/23 0606) SpO2:  [87 %-100 %] 100 % (05/23 0606) Weight:  [93.4 kg (205 lb 14.6 oz)] 93.4 kg (205 lb 14.6 oz) (05/23 0601) Last BM Date: 04/17/18  Intake/Output from previous day: 05/22 0701 - 05/23 0700 In: 3217.5 [P.O.:440; I.V.:2777.5] Out: 3301 [Urine:3250; Stool:1; Blood:50] Intake/Output this shift: No intake/output data recorded.  Gen: NAD, comfortable CV: RRR Pulm: Normal work of breathing Abd: Obese, soft, nontender, nondistended; incisions c/d/i - no erythema; some ecchmyosis around umbilical incision Ext: SCDs in place  UOP: 3.2L/24hrs  Lab Results: CBC  Recent Labs    04/19/18 0509  WBC 11.4*  HGB 12.5  HCT 37.2  PLT 310   BMET Recent Labs    04/19/18 0509  NA 139  K 3.3*  CL 103  CO2 25  GLUCOSE 141*  BUN 9  CREATININE 0.78  CALCIUM 8.9   Anti-infectives: Anti-infectives (From admission, onward)   Start     Dose/Rate Route Frequency Ordered Stop   04/18/18 0600  cefoTEtan in Dextrose 5% (CEFOTAN) IVPB 2 g     2 g Intravenous On call to O.R. 04/18/18 0538 04/18/18 0729     Assessment/Plan: Patient Active Problem List   Diagnosis Date Noted  . Colon cancer ([place]) 04/18/2018  . Class 2 obesity with serious comorbidity and body mass index (BMI) of 38.0 to 38.9 in adult 07/27/2017  . Vitamin D deficiency 07/27/2017  . Other hyperlipidemia 06/29/2017  . Prediabetes 06/29/2017  . Other fatigue 06/29/2017  . Shortness of breath on exertion 06/29/2017  . Class 2 obesity with serious comorbidity and body mass index (BMI) of 39.0 to 39.9 in adult 06/29/2017  . Left hip pain  04/18/2017  . Pre-diabetes 02/05/2016  . Left thyroid nodule 11/10/2014  . TBI (traumatic brain injury) ([place]) 07/15/2014  . Chest pain 12/13/2013  . Dyslipidemia 12/13/2013  . Obesity (BMI 35.0-39.9 without comorbidity) 12/13/2013  . Hypertension 01/18/2012  . GERD (gastroesophageal reflux disease) 01/18/2012  . Depression 01/18/2012  . Migraine syndrome 01/18/2012   Ms. Colee is a 25yoF with hx HTN, DM, HLD s/p laparoscopic sigmoidectomy + flexible sigmoidoscopy 04/18/18 for 1.7cm polyp coming back with invasive cancer - POD#1  -Ambulate 5x/day; IS 10x/hr while awake -Full liquid diet today - no concentrated sweets; restart metformin -Hypokalemia- replace with PO potassium -Hypomagnesemia - replace with 2g IV magnesium today -PPx: SQH, SCDs   LOS: 1 day   Sharon Mt. Dema Severin, M.D. General and Colorectal Surgery [facility]

## 2018-04-20 LAB — CBC
HCT: 33.6 % — ABNORMAL LOW (ref 36.0–46.0)
Hemoglobin: 11.2 g/dL — ABNORMAL LOW (ref 12.0–15.0)
MCH: 29.2 pg (ref 26.0–34.0)
MCHC: 33.3 g/dL (ref 30.0–36.0)
MCV: 87.7 fL (ref 78.0–100.0)
PLATELETS: 250 10*3/uL (ref 150–400)
RBC: 3.83 MIL/uL — AB (ref 3.87–5.11)
RDW: 14.3 % (ref 11.5–15.5)
WBC: 8 10*3/uL (ref 4.0–10.5)

## 2018-04-20 LAB — BASIC METABOLIC PANEL
ANION GAP: 9 (ref 5–15)
BUN: 12 mg/dL (ref 6–20)
CO2: 26 mmol/L (ref 22–32)
Calcium: 9 mg/dL (ref 8.9–10.3)
Chloride: 105 mmol/L (ref 101–111)
Creatinine, Ser: 1.24 mg/dL — ABNORMAL HIGH (ref 0.44–1.00)
GFR, EST AFRICAN AMERICAN: 52 mL/min — AB (ref >60)
GFR, EST NON AFRICAN AMERICAN: 45 mL/min — AB (ref >60)
GLUCOSE: 93 mg/dL (ref 65–99)
POTASSIUM: 3.4 mmol/L — AB (ref 3.5–5.1)
SODIUM: 140 mmol/L (ref 135–145)

## 2018-04-20 LAB — GLUCOSE, CAPILLARY
GLUCOSE-CAPILLARY: 109 mg/dL — AB (ref 65–99)
GLUCOSE-CAPILLARY: 94 mg/dL (ref 65–99)
Glucose-Capillary: 121 mg/dL — ABNORMAL HIGH (ref 65–99)
Glucose-Capillary: 102 mg/dL — ABNORMAL HIGH (ref 65–99)

## 2018-04-20 LAB — PHOSPHORUS: PHOSPHORUS: 3.3 mg/dL (ref 2.5–4.6)

## 2018-04-20 LAB — MAGNESIUM: MAGNESIUM: 2.1 mg/dL (ref 1.7–2.4)

## 2018-04-20 MED ORDER — TRAMADOL HCL 50 MG PO TABS: 50.0000 mg | ORAL_TABLET | Freq: Four times a day (QID) | ORAL | 0 refills | Status: AC | PRN

## 2018-04-20 MED ORDER — POTASSIUM CHLORIDE CRYS ER 20 MEQ PO TBCR
20.0000 meq | EXTENDED_RELEASE_TABLET | Freq: Once | ORAL | Status: AC
  Administered 2018-04-20: 20 meq via ORAL
  Filled 2018-04-20: qty 1

## 2018-04-20 NOTE — Progress Notes (Signed)
Subjective No acute events. Ambulating multiple times per day. Tolerating full liquids without n/v. Pain well controlled, not taking much narcotics at this point. +Flatus and BM.  Objective: Vital signs in last 24 hours: Temp:  [97.5 F (36.4 C)-98.2 F (36.8 C)] 98.2 F (36.8 C) (05/24 0546) Pulse Rate:  [53-66] 60 (05/24 0546) Resp:  [16-18] 18 (05/24 0546) BP: (120-138)/(62-70) 137/67 (05/24 0546) SpO2:  [95 %-98 %] 98 % (05/24 0546) Weight:  [99.3 kg (218 lb 14.7 oz)] 99.3 kg (218 lb 14.7 oz) (05/24 0546) Last BM Date: 04/17/18  Intake/Output from previous day: 05/23 0701 - 05/24 0700 In: 2985 [P.O.:360; I.V.:2625] Out: 1020 [Urine:1020] Intake/Output this shift: No intake/output data recorded.  Gen: NAD, comfortable CV: RRR Pulm: Normal work of breathing Abd: Obese, soft, nontender, nondistended; incisions c/d/i - no erythema; some ecchmyosis around umbilical incision Ext: SCDs in place  UOP: 1020cc/24hrs  Lab Results: CBC  Recent Labs    04/19/18 0509 04/20/18 0442  WBC 11.4* 8.0  HGB 12.5 11.2*  HCT 37.2 33.6*  PLT 310 250   BMET Recent Labs    04/19/18 0509 04/20/18 0442  NA 139 140  K 3.3* 3.4*  CL 103 105  CO2 25 26  GLUCOSE 141* 93  BUN 9 12  CREATININE 0.78 1.24*  CALCIUM 8.9 9.0   Anti-infectives: Anti-infectives (From admission, onward)   Start     Dose/Rate Route Frequency Ordered Stop   04/18/18 0600  cefoTEtan in Dextrose 5% (CEFOTAN) IVPB 2 g     2 g Intravenous On call to O.R. 04/18/18 0538 04/18/18 0729     Assessment/Plan: Patient Active Problem List   Diagnosis Date Noted  . Colon cancer ([place]) 04/18/2018  . Class 2 obesity with serious comorbidity and body mass index (BMI) of 38.0 to 38.9 in adult 07/27/2017  . Vitamin D deficiency 07/27/2017  . Other hyperlipidemia 06/29/2017  . Prediabetes 06/29/2017  . Other fatigue 06/29/2017  . Shortness of breath on exertion 06/29/2017  . Class 2 obesity with serious comorbidity and  body mass index (BMI) of 39.0 to 39.9 in adult 06/29/2017  . Left hip pain 04/18/2017  . Pre-diabetes 02/05/2016  . Left thyroid nodule 11/10/2014  . TBI (traumatic brain injury) ([place]) 07/15/2014  . Chest pain 12/13/2013  . Dyslipidemia 12/13/2013  . Obesity (BMI 35.0-39.9 without comorbidity) 12/13/2013  . Hypertension 01/18/2012  . GERD (gastroesophageal reflux disease) 01/18/2012  . Depression 01/18/2012  . Migraine syndrome 01/18/2012   [name] is a 13yoF with hx HTN, DM, HLD s/p laparoscopic sigmoidectomy + flexible sigmoidoscopy 04/18/18 for 1.7cm polyp coming back with invasive cancer - POD#2  -Ambulate 5x/day; IS 10x/hr while awake -Advance to regular diet - diabetic - no concentrated sweets; continue metformin -D/C Entereg -Hypokalemia- replace with PO potassium - 29mEq -Mild AKI - Cr bumped to 1.24. Continue PO fluids, d/c ibuprofen, repeat labs tomorrow -PPx: SQH, SCDs -Dispo: If trending back down, tolerates oral intake and continues with bowel fxn, pain controlled... will plan d/c home tomorrow   LOS: 2 days   Sharon Mt. Dema Severin, M.D. General and Colorectal Surgery [facility]

## 2018-04-21 LAB — BASIC METABOLIC PANEL
ANION GAP: 9 (ref 5–15)
BUN: 11 mg/dL (ref 6–20)
CALCIUM: 8.5 mg/dL — AB (ref 8.9–10.3)
CO2: 28 mmol/L (ref 22–32)
Chloride: 102 mmol/L (ref 101–111)
Creatinine, Ser: 0.76 mg/dL (ref 0.44–1.00)
Glucose, Bld: 88 mg/dL (ref 65–99)
POTASSIUM: 3.3 mmol/L — AB (ref 3.5–5.1)
Sodium: 139 mmol/L (ref 135–145)

## 2018-04-21 LAB — PHOSPHORUS: PHOSPHORUS: 3.6 mg/dL (ref 2.5–4.6)

## 2018-04-21 LAB — GLUCOSE, CAPILLARY: GLUCOSE-CAPILLARY: 85 mg/dL (ref 65–99)

## 2018-04-21 LAB — MAGNESIUM: Magnesium: 1.7 mg/dL (ref 1.7–2.4)

## 2018-04-21 NOTE — Discharge Summary (Signed)
Physician Discharge Summary  Patient ID:  [name]  MRN: [id number]  DOB/AGE: [birth year] 67 y.o.  Admit date: 04/18/2018 Discharge date: 04/21/2018  Discharge Diagnoses:  1. Sigmoid colon cancer  Final path pending 2. DM 3.  HTN   Active Problems:   Colon cancer ([place])  Operation: Procedure(s): LAPAROSCOPIC ASSISTED SIGMOIDECTOMY, FLEXIBLE SIGMOIDOSCOPY on 04/18/2018 - C. [facility]  Discharged Condition: good  Hospital Course: [name] is an 67 y.o. female whose primary care physician is Copland, Gay Filler, MD and who was admitted 04/18/2018 with a chief complaint of sigmoid colon cancer.   She was brought to the operating room on 04/18/2018 and underwent  LAPAROSCOPIC ASSISTED SIGMOIDECTOMY, FLEXIBLE SIGMOIDOSCOPY.   She is now 3 days post op and doing well. Her Creatinine was elevated mildly yesterday, it is back to normal today. Her path is pending. She is ready to go home.  The discharge instructions were reviewed with the patient.  Consults: None  Significant Diagnostic Studies: Results for orders placed or performed during the hospital encounter of 04/18/18  Glucose, capillary  Result Value Ref Range   Glucose-Capillary 175 (H) 65 - 99 mg/dL  Glucose, capillary  Result Value Ref Range   Glucose-Capillary 144 (H) 65 - 99 mg/dL  Glucose, capillary  Result Value Ref Range   Glucose-Capillary 176 (H) 65 - 99 mg/dL  Basic metabolic panel  Result Value Ref Range   Sodium 139 135 - 145 mmol/L   Potassium 3.3 (L) 3.5 - 5.1 mmol/L   Chloride 103 101 - 111 mmol/L   CO2 25 22 - 32 mmol/L   Glucose, Bld 141 (H) 65 - 99 mg/dL   BUN 9 6 - 20 mg/dL   Creatinine, Ser 0.78 0.44 - 1.00 mg/dL   Calcium 8.9 8.9 - 10.3 mg/dL   GFR calc non Af Amer >60 >60 mL/min   GFR calc Af Amer >60 >60 mL/min   Anion gap 11 5 - 15  CBC  Result Value Ref Range   WBC 11.4 (H) 4.0 - 10.5 K/uL   RBC 4.32 3.87 - 5.11 MIL/uL   Hemoglobin 12.5 12.0 - 15.0 g/dL   HCT 37.2  36.0 - 46.0 %   MCV 86.1 78.0 - 100.0 fL   MCH 28.9 26.0 - 34.0 pg   MCHC 33.6 30.0 - 36.0 g/dL   RDW 13.6 11.5 - 15.5 %   Platelets 310 150 - 400 K/uL  Magnesium  Result Value Ref Range   Magnesium 1.7 1.7 - 2.4 mg/dL  Phosphorus  Result Value Ref Range   Phosphorus 2.9 2.5 - 4.6 mg/dL  Glucose, capillary  Result Value Ref Range   Glucose-Capillary 157 (H) 65 - 99 mg/dL  Glucose, capillary  Result Value Ref Range   Glucose-Capillary 92 65 - 99 mg/dL  Glucose, capillary  Result Value Ref Range   Glucose-Capillary 125 (H) 65 - 99 mg/dL  Glucose, capillary  Result Value Ref Range   Glucose-Capillary 104 (H) 65 - 99 mg/dL  CBC  Result Value Ref Range   WBC 8.0 4.0 - 10.5 K/uL   RBC 3.83 (L) 3.87 - 5.11 MIL/uL   Hemoglobin 11.2 (L) 12.0 - 15.0 g/dL   HCT 33.6 (L) 36.0 - 46.0 %   MCV 87.7 78.0 - 100.0 fL   MCH 29.2 26.0 - 34.0 pg   MCHC 33.3 30.0 - 36.0 g/dL   RDW 14.3 11.5 - 15.5 %   Platelets 250 150 - 400 K/uL  Basic metabolic  panel  Result Value Ref Range   Sodium 140 135 - 145 mmol/L   Potassium 3.4 (L) 3.5 - 5.1 mmol/L   Chloride 105 101 - 111 mmol/L   CO2 26 22 - 32 mmol/L   Glucose, Bld 93 65 - 99 mg/dL   BUN 12 6 - 20 mg/dL   Creatinine, Ser 1.24 (H) 0.44 - 1.00 mg/dL   Calcium 9.0 8.9 - 10.3 mg/dL   GFR calc non Af Amer 45 (L) >60 mL/min   GFR calc Af Amer 52 (L) >60 mL/min   Anion gap 9 5 - 15  Magnesium  Result Value Ref Range   Magnesium 2.1 1.7 - 2.4 mg/dL  Phosphorus  Result Value Ref Range   Phosphorus 3.3 2.5 - 4.6 mg/dL  Glucose, capillary  Result Value Ref Range   Glucose-Capillary 114 (H) 65 - 99 mg/dL  Glucose, capillary  Result Value Ref Range   Glucose-Capillary 109 (H) 65 - 99 mg/dL  Glucose, capillary  Result Value Ref Range   Glucose-Capillary 94 65 - 99 mg/dL  Basic metabolic panel  Result Value Ref Range   Sodium 139 135 - 145 mmol/L   Potassium 3.3 (L) 3.5 - 5.1 mmol/L   Chloride 102 101 - 111 mmol/L   CO2 28 22 - 32 mmol/L    Glucose, Bld 88 65 - 99 mg/dL   BUN 11 6 - 20 mg/dL   Creatinine, Ser 0.76 0.44 - 1.00 mg/dL   Calcium 8.5 (L) 8.9 - 10.3 mg/dL   GFR calc non Af Amer >60 >60 mL/min   GFR calc Af Amer >60 >60 mL/min   Anion gap 9 5 - 15  Magnesium  Result Value Ref Range   Magnesium 1.7 1.7 - 2.4 mg/dL  Phosphorus  Result Value Ref Range   Phosphorus 3.6 2.5 - 4.6 mg/dL  Glucose, capillary  Result Value Ref Range   Glucose-Capillary 121 (H) 65 - 99 mg/dL  Glucose, capillary  Result Value Ref Range   Glucose-Capillary 102 (H) 65 - 99 mg/dL  Glucose, capillary  Result Value Ref Range   Glucose-Capillary 85 65 - 99 mg/dL    Dg Chest 2 View  Result Date: 04/12/2018 CLINICAL DATA:  Preop for surgery, history of medicated hypertension history of colon carcinoma EXAM: CHEST - 2 VIEW COMPARISON:  CT chest of 02/23/2018 and chest x-ray of 10/08/2013 FINDINGS: No active infiltrate or effusion is seen. The descending thoracic aorta remains ectatic and mild cardiomegaly is stable. There are degenerative changes in the mid to lower thoracic spine. IMPRESSION: Stable mild cardiomegaly and ectatic descending thoracic aorta. No active lung disease. Electronically Signed   By: Ivar Drape M.D.   On: 04/12/2018 15:24    Discharge Exam:  Vitals:   04/20/18 2136 04/21/18 0550  BP: 140/72 (!) 150/80  Pulse: 66 62  Resp: 18 18  Temp: 97.6 F (36.4 C) 98.5 F (36.9 C)  SpO2: 98% 97%    General: WN F who is alert and generally healthy appearing.  Lungs: Clear to auscultation and symmetric breath sounds. Heart:  RRR. No murmur or rub. Abdomen: Soft. No mass.  Normal bowel sounds.          Wounds look good.    Discharge Medications:   Allergies as of 04/21/2018      Reactions   Hydrochlorothiazide Other (See Comments)   Increases risk of gout flares   Botox [botulinum Toxin Type A] Swelling   Onabotulinumtoxina Swelling   Toprol  Xl [metoprolol Succinate] Hives      Medication List    TAKE these  medications   ALPRAZolam 0.25 MG tablet Commonly known as:  XANAX Take 0.25 mg by mouth 2 (two) times daily as needed for anxiety.   amLODipine 10 MG tablet Commonly known as:  NORVASC TAKE 1 TABLET BY MOUTH DAILY What changed:    how much to take  how to take this  when to take this   CAMBIA 50 MG Pack Generic drug:  Diclofenac Potassium Take 50 mg by mouth daily as needed (migraines).   carvedilol 3.125 MG tablet Commonly known as:  COREG Take 1 tablet (3.125 mg total) by mouth 2 (two) times daily.   cetirizine 10 MG tablet Commonly known as:  ZYRTEC Take 10 mg by mouth daily as needed for allergies.   desvenlafaxine 100 MG 24 hr tablet Commonly known as:  PRISTIQ Take 100 mg by mouth daily.   diclofenac 1.3 % Ptch Commonly known as:  FLECTOR Place 1 patch onto the skin 2 (two) times daily as needed (pain).   diclofenac sodium 1 % Gel Commonly known as:  VOLTAREN Apply 2 g topically 4 (four) times daily. What changed:    when to take this  reasons to take this   frovatriptan 2.5 MG tablet Commonly known as:  FROVA Take 2.5 mg by mouth as needed for migraine. If recurs, may repeat after 2 hours. Max of 3 tabs in 24 hours.   gabapentin 300 MG capsule Commonly known as:  NEURONTIN Take 300 mg by mouth 3 (three) times daily as needed (headaches).   ibuprofen 200 MG tablet Commonly known as:  ADVIL,MOTRIN Take 200 mg by mouth 3 (three) times daily as needed for headache or moderate pain.   ipratropium 0.03 % nasal spray Commonly known as:  ATROVENT Place 2 sprays into the nose 4 (four) times daily. What changed:    when to take this  reasons to take this   lisinopril 20 MG tablet Commonly known as:  PRINIVIL,ZESTRIL Take 1 tablet (20 mg total) by mouth daily.   metFORMIN 500 MG tablet Commonly known as:  GLUCOPHAGE Take 1 tablet (500 mg total) by mouth daily. with food What changed:    when to take this  additional instructions   naproxen  sodium 220 MG tablet Commonly known as:  ALEVE Take 220 mg by mouth daily as needed (pain).   nitroGLYCERIN 0.4 MG SL tablet Commonly known as:  NITROSTAT Place 1 tablet (0.4 mg total) under the tongue every 5 (five) minutes as needed. What changed:  reasons to take this   Olopatadine HCl 0.2 % Soln Commonly known as:  PATADAY Apply 1 drop daily as needed to eye (for itching).   omeprazole 40 MG capsule Commonly known as:  PRILOSEC Take 40 mg by mouth daily.   polyethylene glycol packet Commonly known as:  MIRALAX / GLYCOLAX Take 17 g by mouth daily.   traMADol 50 MG tablet Commonly known as:  ULTRAM Take 1 tablet (50 mg total) by mouth every 6 (six) hours as needed for up to 5 days.   valACYclovir 500 MG tablet Commonly known as:  VALTREX Take 1 tablet (500 mg total) by mouth 2 (two) times daily. Use for 3 days as needed for outbreak   Vitamin D (Ergocalciferol) 50000 units Caps capsule Commonly known as:  DRISDOL Take 1 capsule (50,000 Units total) by mouth every 7 (seven) days.       Disposition:  Discharge disposition: 01-Home or Self Care       Discharge Instructions    Diet - low sodium heart healthy   Complete by:  As directed    Increase activity slowly   Complete by:  As directed       Follow-up Information    Ileana Roup, MD. Schedule an appointment as soon as possible for a visit in 2 week(s).   Specialty:  General Surgery Contact information: [place] [address] [phone]            Signed: Alphonsa Overall, M.D., [facility] Office:  [phone]  04/21/2018, 8:50 AM

## 2018-05-07 ENCOUNTER — Ambulatory Visit (INDEPENDENT_AMBULATORY_CARE_PROVIDER_SITE_OTHER): Payer: BC Managed Care – PPO | Admitting: Physician Assistant

## 2018-05-22 ENCOUNTER — Ambulatory Visit (HOSPITAL_BASED_OUTPATIENT_CLINIC_OR_DEPARTMENT_OTHER)
Admission: RE | Admit: 2018-05-22 | Discharge: 2018-05-22 | Disposition: A | Discharge status: Home / Self Care | Payer: BC Managed Care – PPO | Source: Ambulatory Visit | Attending: Family Medicine | Admitting: Family Medicine

## 2018-05-22 ENCOUNTER — Encounter: Payer: Self-pay | Admitting: Family Medicine

## 2018-05-22 DIAGNOSIS — E049 Nontoxic goiter, unspecified: Secondary | ICD-10-CM

## 2018-05-22 DIAGNOSIS — E041 Nontoxic single thyroid nodule: Secondary | ICD-10-CM | POA: Insufficient documentation

## 2018-05-29 IMAGING — CR DG CHEST 2V
2 series · 2 of 2 positions shown · non-contrast
Comparison: CT chest of 02/23/2018 and chest x-ray of 10/08/2013

CLINICAL DATA: Preop for surgery, history of medicated hypertension
history of colon carcinoma

EXAM:
CHEST - 2 VIEW

[w chest pa]
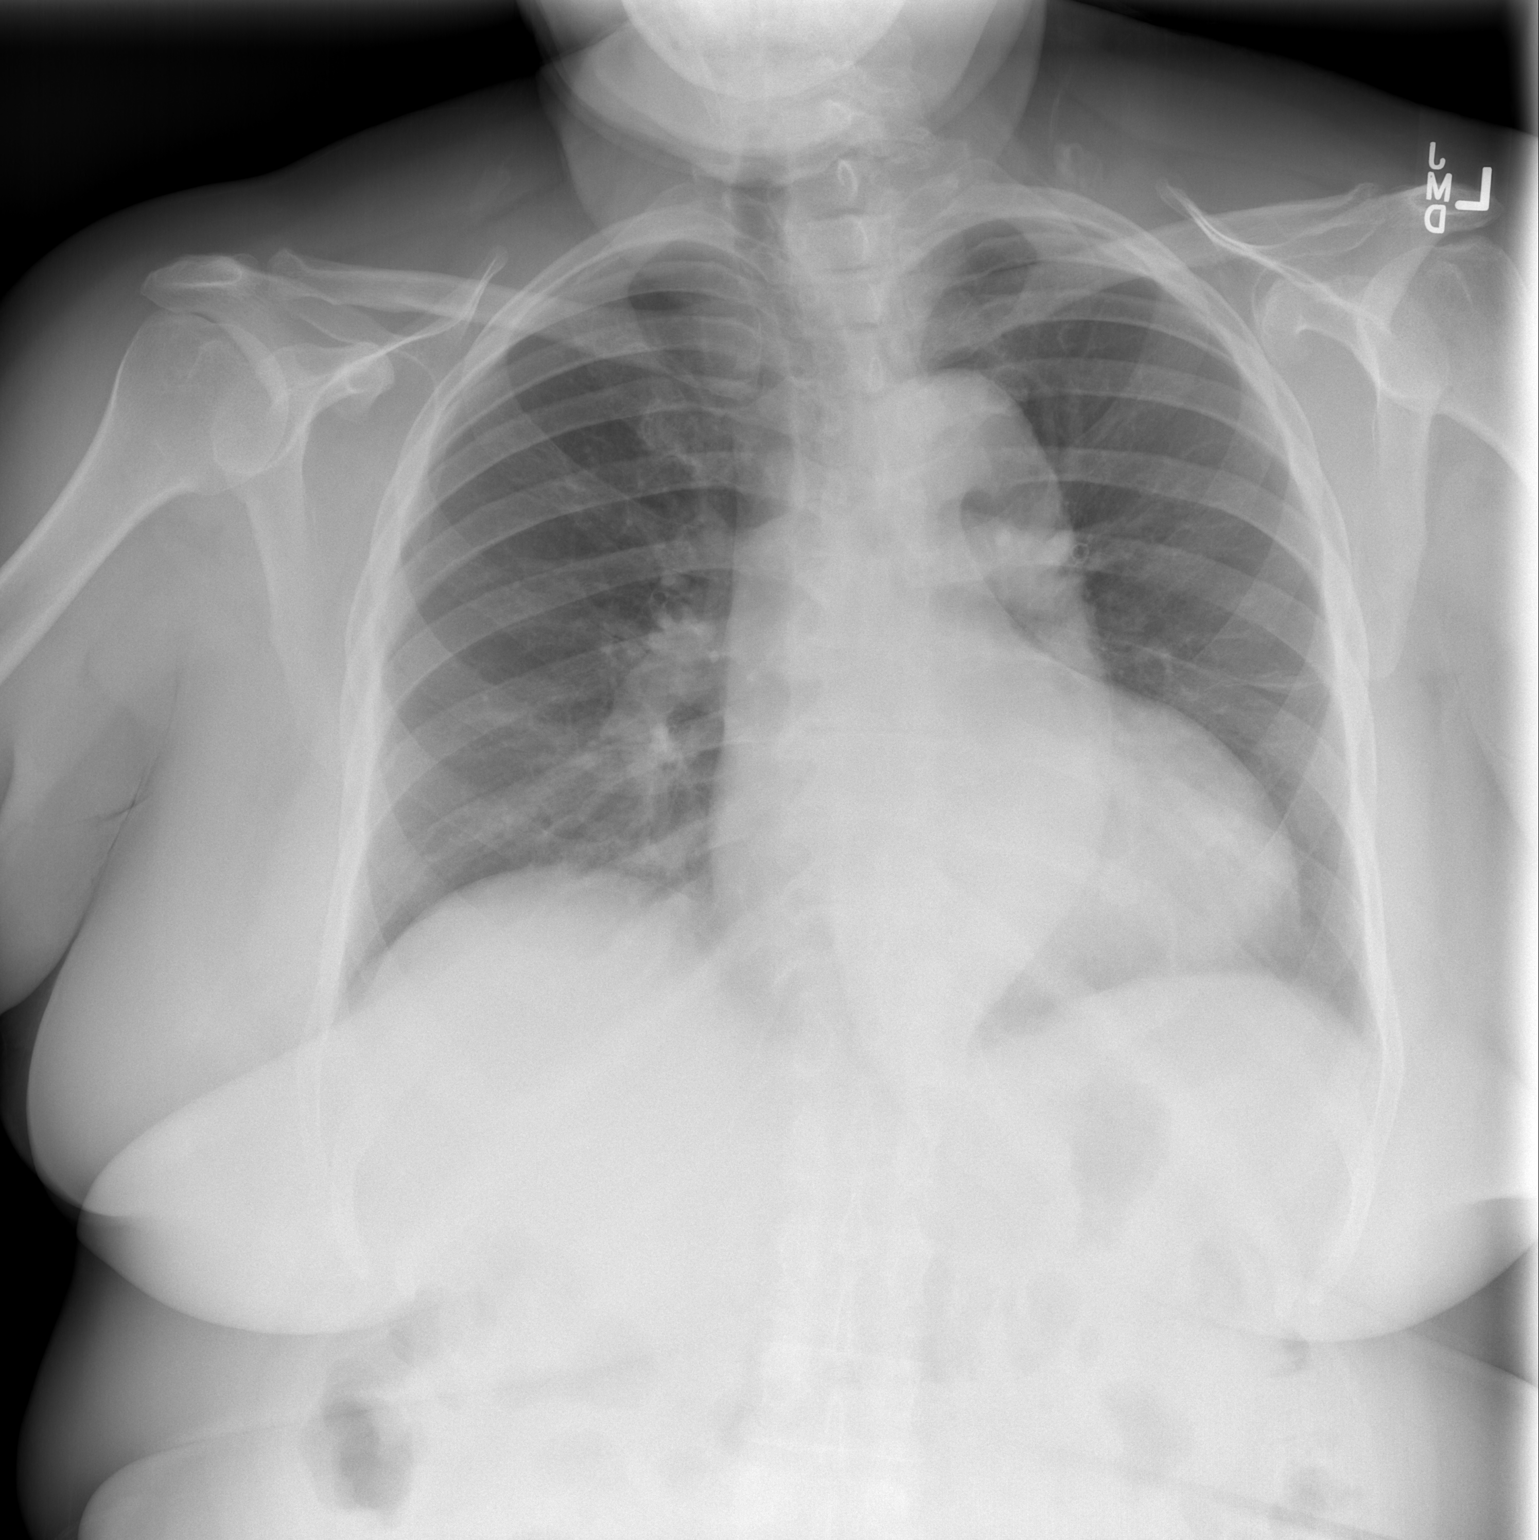

[w chest lat]
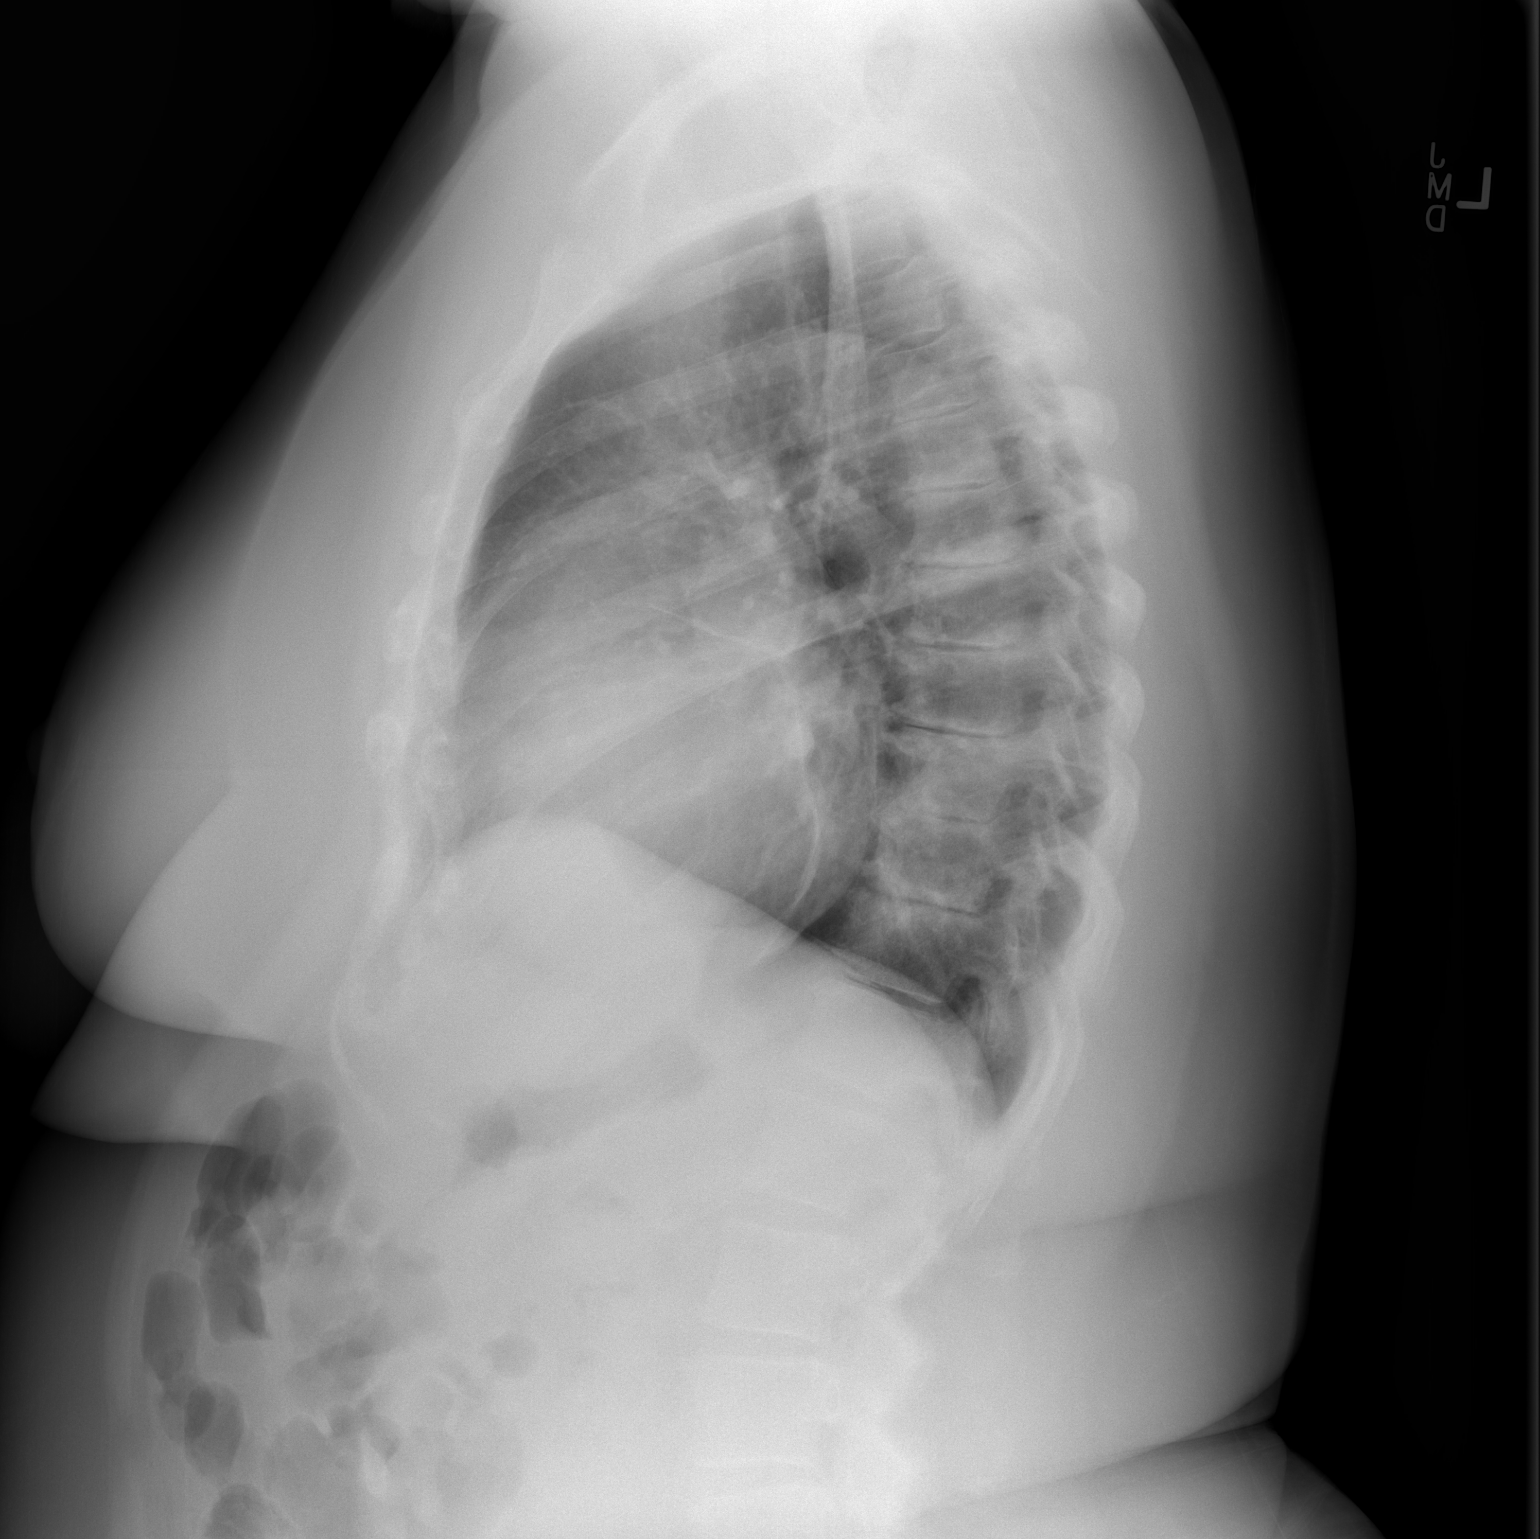

[2 of 2 positions shown; findings below may reference images not displayed]

FINDINGS: No active infiltrate or effusion is seen. The descending thoracic
aorta remains ectatic and mild cardiomegaly is stable. There are
degenerative changes in the mid to lower thoracic spine.
IMPRESSION: Stable mild cardiomegaly and ectatic descending thoracic aorta. No
active lung disease.

## 2018-07-17 ENCOUNTER — Other Ambulatory Visit: Payer: Self-pay | Admitting: Internal Medicine

## 2018-07-17 DIAGNOSIS — I10 Essential (primary) hypertension: Secondary | ICD-10-CM

## 2018-09-14 ENCOUNTER — Encounter: Payer: Self-pay | Admitting: Internal Medicine

## 2018-09-14 ENCOUNTER — Ambulatory Visit: Payer: BC Managed Care – PPO | Admitting: Internal Medicine

## 2018-09-14 VITALS — BP 110/80 | HR 65 | Ht 63.0 in | Wt 211.6 lb

## 2018-09-14 DIAGNOSIS — E785 Hyperlipidemia, unspecified: Secondary | ICD-10-CM | POA: Diagnosis not present

## 2018-09-14 DIAGNOSIS — I10 Essential (primary) hypertension: Secondary | ICD-10-CM

## 2018-09-14 DIAGNOSIS — E669 Obesity, unspecified: Secondary | ICD-10-CM

## 2018-09-14 MED ORDER — ATORVASTATIN CALCIUM 40 MG PO TABS: 40.0000 mg | ORAL_TABLET | Freq: Every day | ORAL | 3 refills | Status: DC

## 2018-09-14 NOTE — Progress Notes (Signed)
OFFICE NOTE  Chief Complaint:  Routine follow-up  Primary Care Physician: Darreld Mclean, MD  HPI:  [name] is a pleasant 67 year old female with a strong family history of heart disease. Her mother died of a sudden heart attack at age 58, her both grandparents had heart disease and a grandmother had a stroke at age 60. She was seen remotely by cardiologist more than 3 years ago and had stress testing at that time which was negative. Back in November she was complaining of chest pain which was intermittent. It was somewhat worse at that time however she had an active bronchitis and was treated. Her symptoms improved somewhat however she still had some chest pain since that time. Some of it is associated with exertion and relieved by rest and other times it comes fairly randomly. It does feel like a squeezing sensation in the upper chest it does not radiate to the neck, arm or back. She describes the discomfort as a 5 or 6/10 in severity.  She occasionally takes nitroglycerin for this however her old prescription was only recently renewed.  She is desiring to start an exercise program and would like cardiovascular evaluation and risk stratification prior to that.  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  [name] had a recent nuclear stress test which was negative.  This demonstrated no reversible ischemia. She also had a lipid NMR performed, which demonstrated elevated LDL cholesterol of 117, with elevated LDL particle numbers to 1347.  Her contents a metabolic profile was normal.  Her blood pressure today is much improved. She denies any further chest pain and only occasionally has migraine headaches. We have not assessed her cholesterol since her last lipid profile and she's not on  treatment for her elevated cholesterol.  I saw [name] back in the office today. Her main complaint is some discomfort over the right trapezius area. She says this is usually worse with stress and tension. She denies any chest pain per se today. Recently her headaches have improved somewhat. Her cholesterol has been fairly well-controlled. Her blood pressure is at goal today. Unfortunate she's not managed to lose much weight and remains close to morbid obesity with BMI of 38.  08/02/2016  Returns today for follow-up. Over the past year she's had no complaints. Recently she was found to have a breast mass and is undergoing a biopsy later this month. She's not had a recent cholesterol check in the past year but had a fairly good cholesterol profile last saw her. She is continue to work on weight loss. She's on medication for depression which she says is working well. Her only issue right now is memory loss.  08/03/2017  [name] was seen today in follow-up. She has a migraine today. Overall though her migraines have improved some from her history of traumatic brain injury. She's taking less medication for that. She is more committed to primary prevention. Her lipid profile has improved some with weight loss recently [facility] Dr. Leafy Ro. She is optimistic about further weight loss which should be helpful for her risk reduction. Blood pressure is elevated today however possibly attributable to her migraine. Her last lipid profile showed an LDL of 119. She's not on a statin and  is a borderline diabetic on metformin. She has a significant family history of early onset coronary disease. She denies chest pain or shortness of breath with exertion.  09/14/2018  [name] is seen today in annual follow-up.  Unfortunately she recently was diagnosed with colon cancer.  She underwent surgical resection and is deemed cured of it.  She does have  follow-up in a year.  She did not require chemotherapy or radiation.  She was working on weight loss and was seen Dr. Leafy Ro however after colon surgery she could not tolerate the diet she was supposed to be on.  She had lost weight but then gained it back.  Overall she is feeling well.  Her blood sugars were borderline and she remained on metformin.  We discussed the importance of lowering her cholesterol which have been elevated with LDL at 130.  Earlier this year her LDL had come down to 118 with weight loss and dietary changes however she does not think that she will be able to make the significant dietary changes she needs to get it lower.  PMHx:  Past Medical History:  Diagnosis Date  . Allergy   . Anemia   . Anxiety   . Arthritis   . Asthma   . Back pain   . Blood transfusion without reported diagnosis   . Cancer of sigmoid colon ([place])   . Constipation   . Depression   . Diabetes mellitus without complication ([place])   . Dyspnea    with exertion  . Fatigue   . Fatty liver    mild  . GERD (gastroesophageal reflux disease)   . Grade I diastolic dysfunction    Echo 08/08/2017 with Grade I DD, EF 51%, Mild Aortic regurgitation, Mild Tricuspid Regurgitation, Pulmonary pressure 30 mm Hg. She denies any chest pain, dyspnea, or peripheral edema.  . Headaches due to old head injury   . History of stomach ulcers   . Hyperlipidemia   . Hypertension   . Lactose intolerance   . Left hip pain   . Left thyroid nodule   . Lipoma of extremity 10/17/2017   Left lower  . Migraines   . Obesity   . TBI (traumatic brain injury) ([place])   . Thyroid disease   . Vitamin D deficiency     Past Surgical History:  Procedure Laterality Date  . ABDOMINAL HYSTERECTOMY    . BREAST LUMPECTOMY WITH RADIOACTIVE SEED LOCALIZATION Left 08/23/2016   Procedure: LEFT BREAST LUMPECTOMY WITH RADIOACTIVE SEED LOCALIZATION;  Surgeon: Rolm Bookbinder, MD;  Location: [facility];  Service: General;   Laterality: Left;  . BREAST SURGERY  2003   reduction  . CESAREAN SECTION    . CHALAZION EXCISION Left 2010  . COLONOSCOPY W/ POLYPECTOMY  02/16/2018   Dr. Ardis Hughs  . FACET JOINT INJECTION  2011 and 2012  . FLEXIBLE SIGMOIDOSCOPY N/A 04/18/2018   Procedure: FLEXIBLE SIGMOIDOSCOPY;  Surgeon: Ileana Roup, MD;  Location: [facility];  Service: General;  Laterality: N/A;  . LAPAROSCOPIC SIGMOID COLECTOMY N/A 04/18/2018   Procedure: LAPAROSCOPIC ASSISTED SIGMOIDECTOMY;  Surgeon: Ileana Roup, MD;  Location: [facility];  Service: General;  Laterality: N/A;  . MYOMECTOMY    . NM MYOCAR PERF WALL MOTION  03/2009   dipyridamole; normal pattern of perfusion in all regions, post-stress EF 65%, normal study   . THYROIDECTOMY  1989   partial  . TRANSTHORACIC ECHOCARDIOGRAM  03/2009   EF=>55%, borderline conc LVH; mild mitral annular calcif, trace  MR; trace TR; AV mildly sclerotic, mild AV regurg; mild pulm valve regurg   . TUBAL LIGATION      FAMHx:  Family History  Problem Relation Age of Onset  . Heart disease Mother        MI, HTN  . Hypertension Mother   . Thyroid disease Mother   . Hyperlipidemia Mother   . Sudden death Mother   . Anxiety disorder Mother   . Heart disease Maternal Grandmother        MI, CVA  . Heart disease Maternal Grandfather        MI  . Stroke Maternal Grandfather   . Heart disease Paternal Grandmother   . Stroke Paternal Grandmother   . Heart disease Paternal Grandfather        MI  . Glaucoma Father   . Diabetes Father   . Depression Daughter   . ADD / ADHD Son   . Thyroid disease Sister   . Heart attack Paternal Uncle        HTN  . Hypertension Paternal Uncle   . Colon cancer Neg Hx   . Stomach cancer Neg Hx   . Esophageal cancer Neg Hx     SOCHx:   reports that she has never smoked. She has never used smokeless tobacco. She reports that she drinks alcohol. She reports that she does not use drugs.  ALLERGIES:  Allergies  Allergen Reactions    . Hydrochlorothiazide Other (See Comments)    Increases risk of gout flares   . Botox [Botulinum Toxin Type A] Swelling  . Onabotulinumtoxina Swelling  . Toprol Xl [Metoprolol Succinate] Hives    ROS: Pertinent items noted in HPI and remainder of comprehensive ROS otherwise negative.  HOME MEDS: Current Outpatient Medications  Medication Sig Dispense Refill  . amLODipine (NORVASC) 10 MG tablet TAKE 1 TABLET BY MOUTH DAILY (Patient taking differently: TAKE 1 TABLET BY MOUTH DAILY AT NOON) 30 tablet 6  . carvedilol (COREG) 3.125 MG tablet Take 1 tablet (3.125 mg total) by mouth 2 (two) times daily. 60 tablet 2  . desvenlafaxine (PRISTIQ) 100 MG 24 hr tablet Take 100 mg by mouth daily.    . diclofenac (FLECTOR) 1.3 % PTCH Place 1 patch onto the skin 2 (two) times daily as needed (pain).     . Diclofenac Potassium (CAMBIA) 50 MG PACK Take 50 mg by mouth daily as needed (migraines).     . diclofenac sodium (VOLTAREN) 1 % GEL Apply 2 g topically 4 (four) times daily. (Patient taking differently: Apply 2 g topically daily as needed (back pain). ) 100 g 3  . frovatriptan (FROVA) 2.5 MG tablet Take 2.5 mg by mouth as needed for migraine. If recurs, may repeat after 2 hours. Max of 3 tabs in 24 hours.    . gabapentin (NEURONTIN) 300 MG capsule Take 300 mg by mouth 3 (three) times daily as needed (headaches).     Marland Kitchen ibuprofen (ADVIL,MOTRIN) 200 MG tablet Take 200 mg by mouth 3 (three) times daily as needed for headache or moderate pain.    Marland Kitchen ipratropium (ATROVENT) 0.03 % nasal spray Place 2 sprays into the nose 4 (four) times daily. (Patient taking differently: Place 2 sprays into the nose 4 (four) times daily as needed for rhinitis. ) 30 mL 1  . lisinopril (PRINIVIL,ZESTRIL) 20 MG tablet TAKE 1 TABLET(20 MG) BY MOUTH DAILY 30 tablet 0  . metFORMIN (GLUCOPHAGE) 500 MG tablet Take 1 tablet (500 mg total) by mouth daily.  with food (Patient taking differently: Take 500 mg by mouth every evening. with  food) 30 tablet 0  . naproxen sodium (ALEVE) 220 MG tablet Take 220 mg by mouth daily as needed (pain).    . Olopatadine HCl (PATADAY) 0.2 % SOLN Apply 1 drop daily as needed to eye (for itching). 2.5 mL 9  . omeprazole (PRILOSEC) 40 MG capsule Take 40 mg by mouth daily.    . valACYclovir (VALTREX) 500 MG tablet Take 1 tablet (500 mg total) by mouth 2 (two) times daily. Use for 3 days as needed for outbreak 30 tablet 2  . ALPRAZolam (XANAX) 0.25 MG tablet Take 0.25 mg by mouth 2 (two) times daily as needed for anxiety.     . cetirizine (ZYRTEC) 10 MG tablet Take 10 mg by mouth daily as needed for allergies.    . nitroGLYCERIN (NITROSTAT) 0.4 MG SL tablet Place 1 tablet (0.4 mg total) under the tongue every 5 (five) minutes as needed. (Patient not taking: Reported on 09/14/2018) 30 tablet 0  . polyethylene glycol (MIRALAX / GLYCOLAX) packet Take 17 g by mouth daily.     No current facility-administered medications for this visit.     LABS/IMAGING: No results found for this or any previous visit (from the past 48 hour(s)). No results found.  VITALS: BP 110/80   Pulse 65   Ht 5\' 3"  (1.6 m)   Wt 211 lb 9.6 oz (96 kg)   BMI 37.48 kg/m   EXAM: General appearance: alert, no distress and moderately obese Neck: no carotid bruit and no JVD Lungs: clear to auscultation bilaterally Heart: regular rate and rhythm, S1, S2 normal, no murmur, click, rub or gallop Abdomen: soft, non-tender; bowel sounds normal; no masses,  no organomegaly Extremities: extremities normal, atraumatic, no cyanosis or edema Pulses: 2+ and symmetric Skin: Skin color, texture, turgor normal. No rashes or lesions Neurologic: Grossly normal  EKG: Normal sinus rhythm with sinus arrhythmia 65, minimal voltage criteria for LVH-personally reviewed  ASSESSMENT: 1. Dyslipidemia 2. Hypertension 3. Obesity - [facility]  4. History of TBI with headaches and memory loss 5. Strong family  history of premature coronary artery disease 6. Mild type 2 diabetes 7. Recent colon cancer status post resection  PLAN: 1.   [name] is doing well and recovering from recent surgery for colon cancer.  Fortunately it seems that may have been cured with primary resection.  She did not require chemotherapy radiation.  She had managed to lose some weight and was down to 200 pounds in April however is now back up to 211.  She will try to make some dietary changes however is not very optimistic.  We did discuss her cholesterol and as it has run above goal for someone with mild diabetes, I would recommend statin therapy.  We discussed starting atorvastatin 40 mg daily.  Plan repeat lipid profile in 3 months.  Follow-up with me annually or sooner as necessary.  Pixie Casino, MD, [facility], [place] Director of the Advanced Lipid Disorders &  Cardiovascular Risk Reduction Clinic Diplomate of the American Board of Clinical Lipidology Attending Cardiologist  Direct Dial: [phone]  Fax: [phone]  Website:  [email or web address] 09/14/2018, 8:56 AM

## 2018-09-14 NOTE — Patient Instructions (Signed)
Medication Instructions:  START atorvastatin 40mg  daily for cholesterol If you need a refill on your cardiac medications before your next appointment, please call your pharmacy.   Lab work: FASTING lab work in 3 months to check cholesterol If you have labs (blood work) drawn today and your tests are completely normal, you will receive your results only by: Marland Kitchen MyChart Message (if you have MyChart) OR . A paper copy in the mail If you have any lab test that is abnormal or we need to change your treatment, we will call you to review the results.  Testing/Procedures: NONE  Follow-Up: At [facility], you and your health needs are our priority.  As part of our continuing mission to provide you with exceptional heart care, we have created designated Provider Care Teams.  These Care Teams include your primary Cardiologist (physician) and Advanced Practice Providers (APPs -  Physician Assistants and Nurse Practitioners) who all work together to provide you with the care you need, when you need it. You will need a follow up appointment in 12 months.  Please call our office 2 months in advance to schedule this appointment.  You may see Dr. Debara Pickett or one of the following Advanced Practice Providers on your designated Care Team: Almyra Deforest, [place] . Fabian Sharp, PA-C  Any Other Special Instructions Will Be Listed Below (If Applicable).

## 2018-09-20 ENCOUNTER — Encounter: Payer: Self-pay | Admitting: Family Medicine

## 2018-10-03 ENCOUNTER — Other Ambulatory Visit: Payer: Self-pay | Admitting: Internal Medicine

## 2018-10-03 DIAGNOSIS — I10 Essential (primary) hypertension: Secondary | ICD-10-CM

## 2018-10-05 NOTE — Telephone Encounter (Signed)
Rx request sent to pharmacy.  

## 2018-12-03 LAB — LIPID PANEL
Chol/HDL Ratio: 2.8 ratio (ref 0.0–4.4)
Cholesterol, Total: 150 mg/dL (ref 100–199)
HDL: 54 mg/dL (ref >39)
LDL Calculated: 79 mg/dL (ref 0–99)
Triglycerides: 87 mg/dL (ref 0–149)
VLDL CHOLESTEROL CAL: 17 mg/dL (ref 5–40)

## 2018-12-10 ENCOUNTER — Other Ambulatory Visit: Payer: Self-pay | Admitting: Internal Medicine

## 2019-01-17 IMAGING — US US THYROID
1 series · 13 of 25 positions shown · non-contrast
Comparison: 04/08/2017, 07/28/2015

CLINICAL DATA: 66-year-old female with a history of prior
right-sided thyroidectomy.

Left thyroid biopsy 07/28/2015.  By report benign result
EXAM:
THYROID ULTRASOUND
TECHNIQUE: Ultrasound examination of the thyroid gland and adjacent soft
tissues was performed.

[Series 1: us thyroid · 0.09mm/px · 13 of 49 slices shown]
[im 1/49]
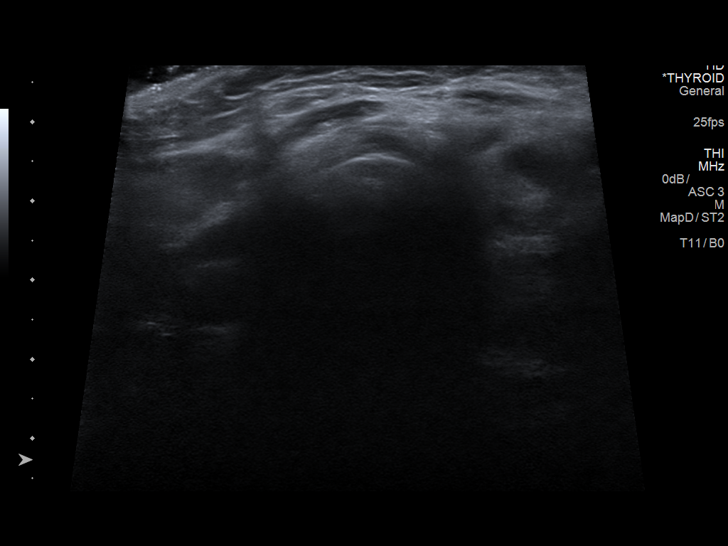
[im 5/49]
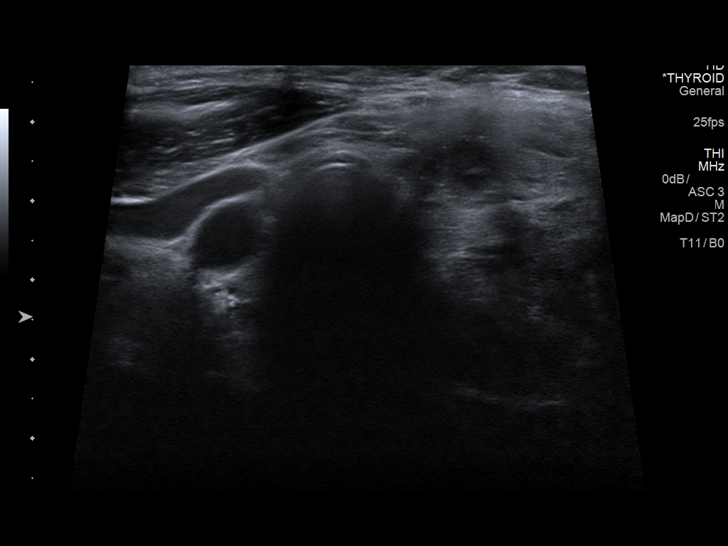
[im 9/49]
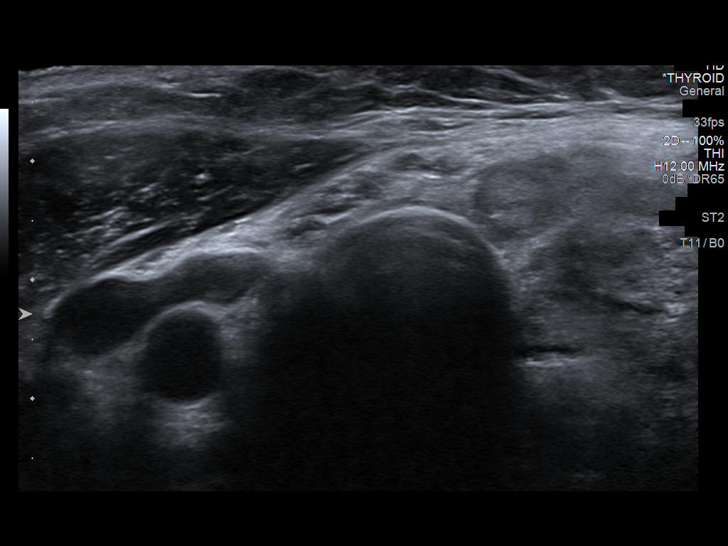
[im 13/49]
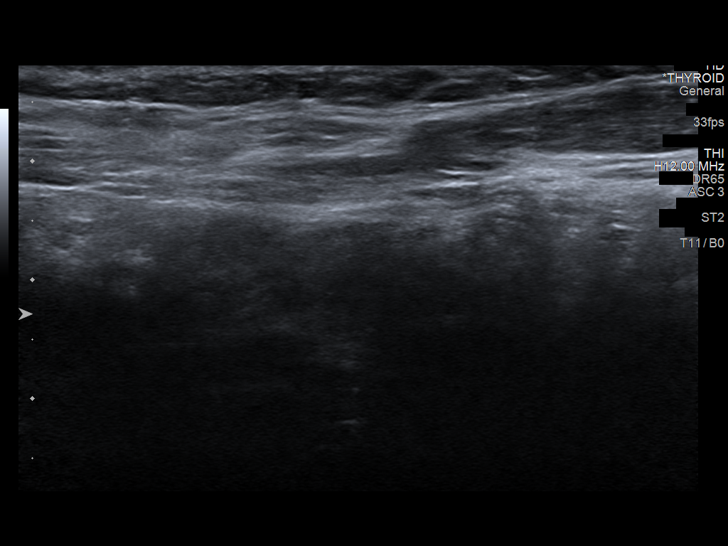
[im 17/49]
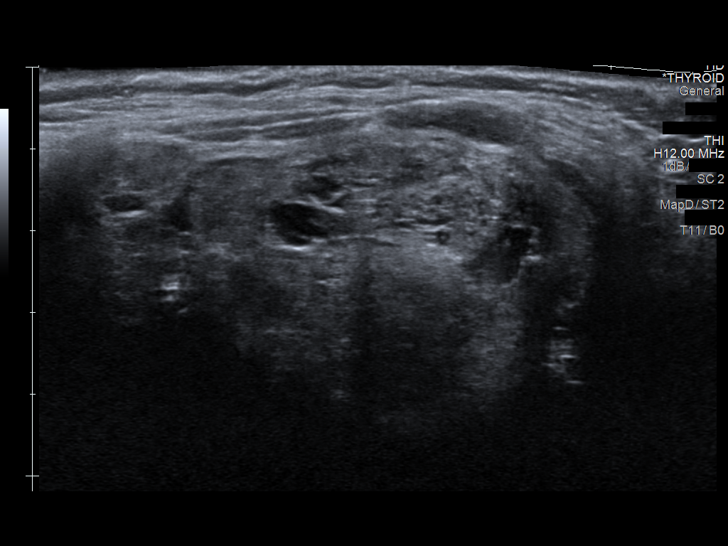
[im 21/49]
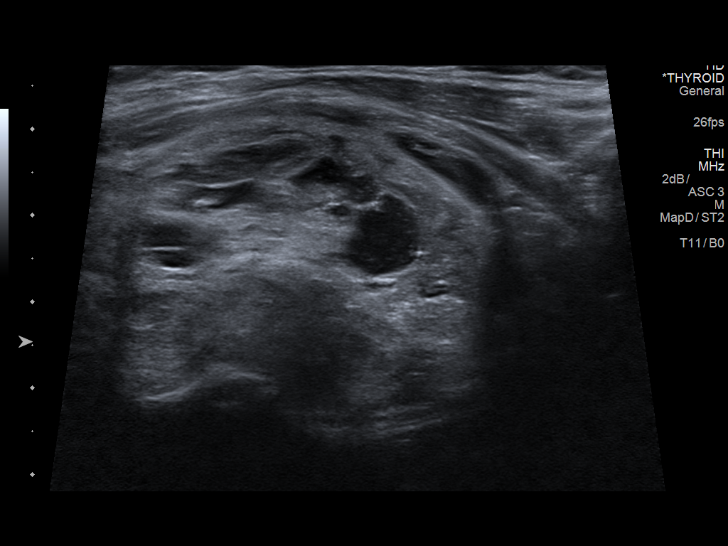
[im 25/49]
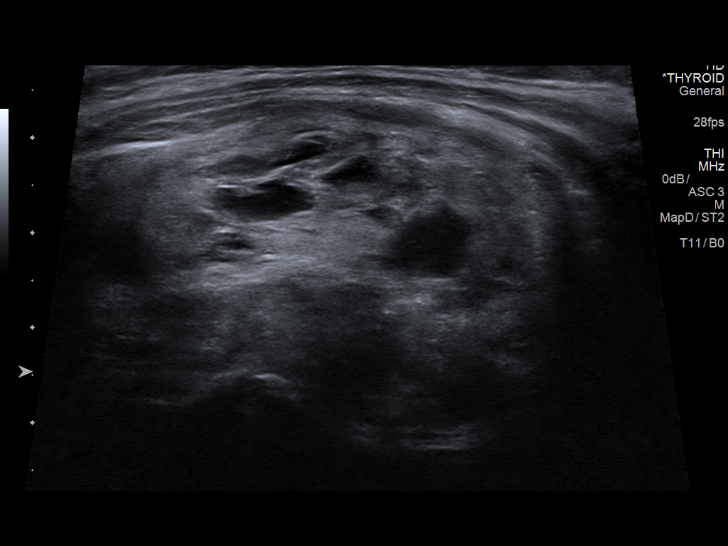
[im 29/49]
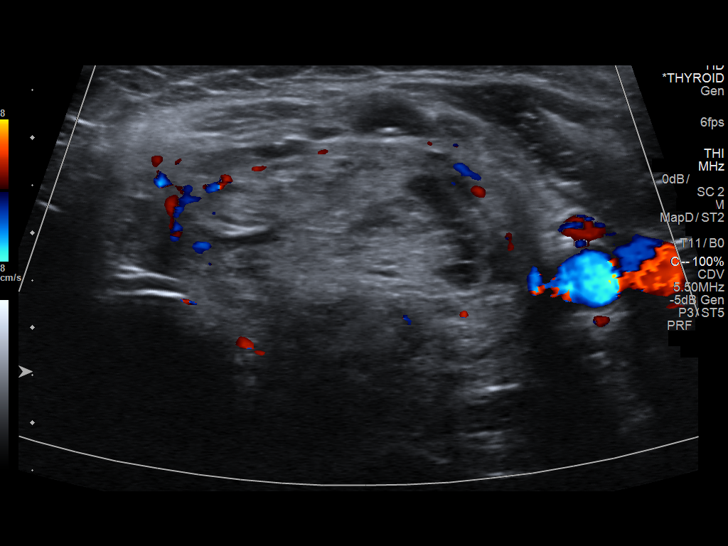
[im 33/49]
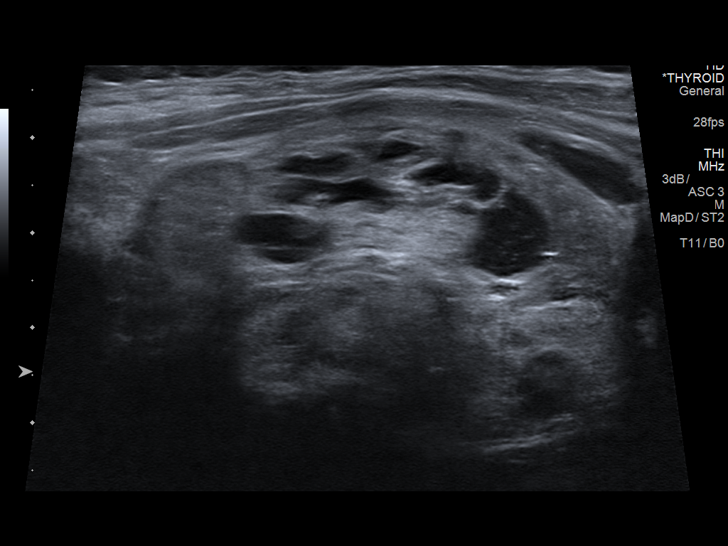
[im 37/49]
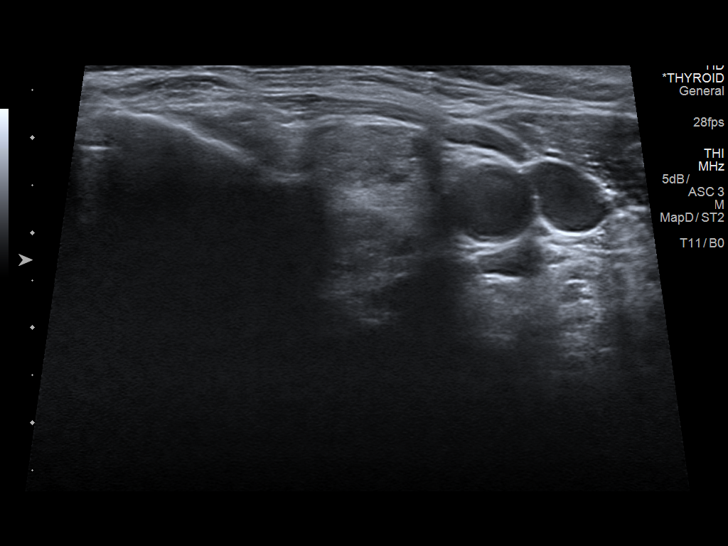
[im 41/49]
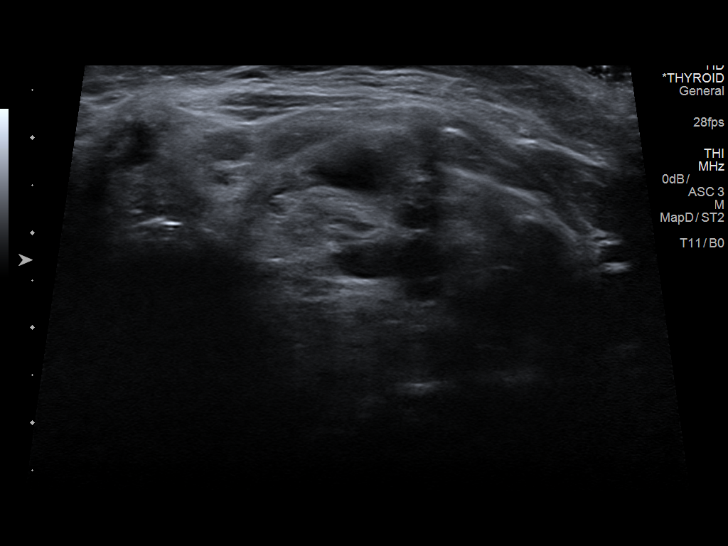
[im 45/49]
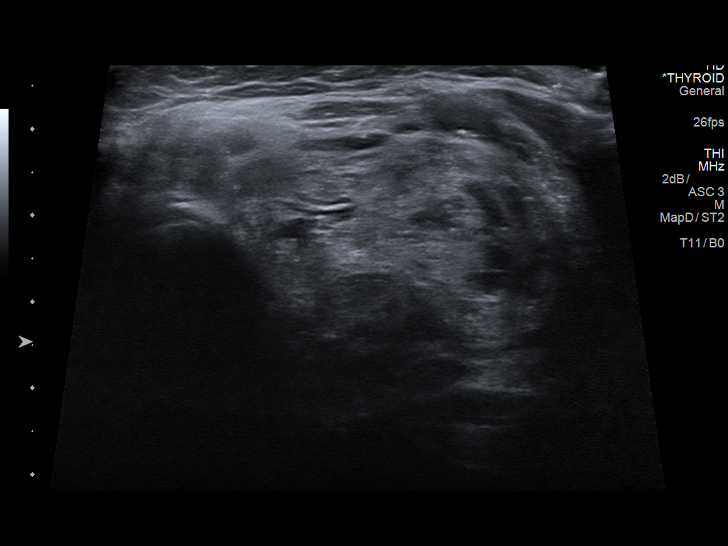
[im 49/49]
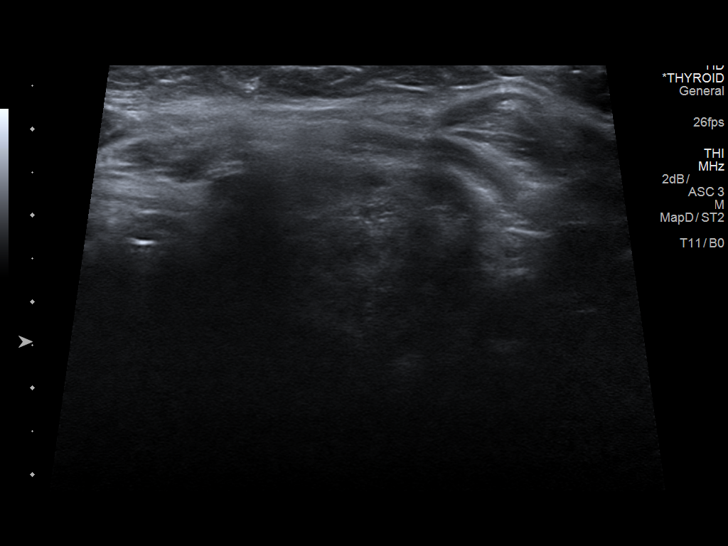

[13 of 25 positions shown; findings below may reference images not displayed]

FINDINGS: Parenchymal Echotexture: Moderately heterogenous

Isthmus: 0.3 cm

Right lobe: Surgical changes of right thyroidectomy

Left lobe: 6.3 cm x 3.2 cm x 4.0 cm

_________________________________________________________

Estimated total number of nodules >/= 1 cm: 1

Number of spongiform nodules >/=  2 cm not described below (TR1): 0

Number of mixed cystic and solid nodules >/= 1.5 cm not described
below (TR2): 0

_________________________________________________________

Nodule # 1:

Location: Left; Mid

Maximum size: 4.5 cm; Other 2 dimensions: 2.0 cm x 3.5 cm

Composition: mixed cystic and solid (1)

Echogenicity: isoechoic (1)

Shape: not taller-than-wide (0)

Margins: ill-defined (0)

Echogenic foci: none (0)

ACR TI-RADS total points: 2.

ACR TI-RADS risk category: TR2 (2 points).

ACR TI-RADS recommendations:

Nodule has been previously biopsied

_________________________________________________________

No adenopathy
IMPRESSION: Surgical changes of right hemithyroidectomy.

Redemonstration of left nodular thyroid. Nodule on the left has been
previously biopsied, and in accordance with updated
criteria/characteristics and prior benign result, needs no further
specific follow-up.

## 2019-02-06 ENCOUNTER — Other Ambulatory Visit: Payer: Self-pay | Admitting: Family Medicine

## 2019-02-15 ENCOUNTER — Other Ambulatory Visit: Payer: Self-pay | Admitting: Cardiology

## 2019-02-15 DIAGNOSIS — I10 Essential (primary) hypertension: Secondary | ICD-10-CM

## 2019-03-25 ENCOUNTER — Ambulatory Visit: Payer: Self-pay | Admitting: *Deleted

## 2019-03-25 NOTE — Telephone Encounter (Signed)
Pt called with having chest pain over her right nipple that is lingering. The pain goes up into her neck and her right shoulder. Started  last week and feels like it is getting worst. The pain in her neck and shoulder is a stabbing type pain. The one in her chest is there all the time  She has hx of high blood pressure strong family hx of heart disease. She also is having dizziness with it.  Explained to the patient, that for women the symptoms of a heart attack are different  from men. Advised pt to go to an ED to be assessed, per protocol She stated she would go to [place] ED. Routing to flow at [facility] for review.    Reason for Disposition . [1] Intermittent  chest pain or "angina" AND [2] increasing in severity or frequency  (Exception: pains lasting a few seconds)  Answer Assessment - Initial Assessment Questions 1. LOCATION: "Where does it hurt?"       Chest 2. RADIATION: "Does the pain go anywhere else?" (e.g., into neck, jaw, arms, back)     To her neck, and right arm 3. ONSET: "When did the chest pain begin?" (Minutes, hours or days)      2 days ago 4. PATTERN "Does the pain come and go, or has it been constant since it started?"  "Does it get worse with exertion?"      Comes and goes but stays in the chest 5. DURATION: "How long does it last" (e.g., seconds, minutes, hours)     Seconds in her back  6. SEVERITY: "How bad is the pain?"  (e.g., Scale 1-10; mild, moderate, or severe)    - MILD (1-3): doesn't interfere with normal activities     - MODERATE (4-7): interferes with normal activities or awakens from sleep    - SEVERE (8-10): excruciating pain, unable to do any normal activities       Pain # now a 2 but can go up to 4 or5 7. CARDIAC RISK FACTORS: "Do you have any history of heart problems or risk factors for heart disease?" (e.g., prior heart attack, angina; high blood pressure, diabetes, being overweight, high cholesterol, smoking, or  strong family history of heart disease)     High blood pressure, diabetes, over weight, high cholesterol and hx of family heart disease and stroke 8. PULMONARY RISK FACTORS: "Do you have any history of lung disease?"  (e.g., blood clots in lung, asthma, emphysema, birth control pills)     Had pneumonia in November 9. CAUSE: "What do you think is causing the chest pain?"     Not sure 10. OTHER SYMPTOMS: "Do you have any other symptoms?" (e.g., dizziness, nausea, vomiting, sweating, fever, difficulty breathing, cough)       A little dizziness, sweats all the time any way, cough (have allergies) 11. PREGNANCY: "Is there any chance you are pregnant?" "When was your last menstrual period?"       n/a  Protocols used: CHEST PAIN-A-AH

## 2019-04-12 ENCOUNTER — Encounter: Payer: Self-pay | Admitting: Gastroenterology

## 2019-04-19 ENCOUNTER — Other Ambulatory Visit: Payer: Self-pay

## 2019-04-19 ENCOUNTER — Ambulatory Visit (AMBULATORY_SURGERY_CENTER): Payer: Self-pay

## 2019-04-19 VITALS — Ht 63.0 in | Wt 207.0 lb

## 2019-04-19 DIAGNOSIS — Z85038 Personal history of other malignant neoplasm of large intestine: Secondary | ICD-10-CM

## 2019-04-19 MED ORDER — PEG 3350-KCL-NA BICARB-NACL 420 G PO SOLR: 4000.0000 mL | Freq: Once | ORAL | 0 refills | Status: AC

## 2019-04-19 NOTE — Progress Notes (Signed)
Denies allergies to eggs or soy products. Denies complication of anesthesia or sedation. Denies use of weight loss medication. Denies use of O2.   Emmi instructions given for colonoscopy.  Pre-Visit was conducted by phone due to Covid 19. Instructoins were reviewed and mailed to confirmed home address. Patient was encouraged to call if she has any questions or concerns regarding instructions.

## 2019-04-23 ENCOUNTER — Encounter: Payer: Self-pay | Admitting: Gastroenterology

## 2019-05-03 ENCOUNTER — Telehealth: Payer: Self-pay | Admitting: Internal Medicine

## 2019-05-03 MED ORDER — CARVEDILOL 3.125 MG PO TABS: 3.1250 mg | ORAL_TABLET | Freq: Two times a day (BID) | ORAL | 1 refills | Status: DC

## 2019-05-03 NOTE — Telephone Encounter (Signed)
Patient noted change in blood pressure recently after increased in migraine HA frequency.   *Patient denies dizziness, swelling,or  blurry vision* She unable to remember the reason to stop carvedilol.Patinet stated she thing she forgot to refill it.   Recommendation:  1. Resume carvedilol 3.125mg  BID  2. Continue all other medication as prescribed  3. Stay hydrated and cool.  4. Continue to monitor BP twice daily and keep records.  5. Call back in 1 week to assess response to medication.

## 2019-05-03 NOTE — Telephone Encounter (Signed)
Returned call to patient of Dr. Debara Pickett.   She reports her BP has been up for a few weeks but she just started checking it. She typically checks BP prior to BP meds but then after as well and reports it will decrease but not enough. 05/03/19 192/90    05/02/19 167/66  170/91   05/01/19 174/95  158/91   04/30/19 157/88   She reports a headache  She is on amlodipine 10mg  daily & lisinopril 20mg  daily. Carvedilol is on list but has not been refilled per our records since 2017.  Routed to [facility] to advise on BP

## 2019-05-03 NOTE — Telephone Encounter (Signed)
New message   Pt c/o BP issue: STAT if pt c/o blurred vision, one-sided weakness or slurred speech  1. What are your last 5 BP readings? 05/03/19 192/90   05/02/19 167/66  170/91  05/01/19 174/95  158/91  04/30/19 157/88   2. Are you having any other symptoms (ex. Dizziness, headache, blurred vision, passed out)? Migraine headache   3. What is your BP issue? Patient states that her b/p is elevated

## 2019-05-06 ENCOUNTER — Telehealth: Payer: Self-pay | Admitting: *Deleted

## 2019-05-06 NOTE — Telephone Encounter (Signed)

## 2019-05-07 ENCOUNTER — Encounter: Payer: Self-pay | Admitting: Gastroenterology

## 2019-05-07 ENCOUNTER — Encounter: Payer: BC Managed Care – PPO | Admitting: Gastroenterology

## 2019-05-07 ENCOUNTER — Other Ambulatory Visit: Payer: Self-pay

## 2019-05-07 ENCOUNTER — Ambulatory Visit (AMBULATORY_SURGERY_CENTER): Payer: BC Managed Care – PPO | Admitting: Gastroenterology

## 2019-05-07 VITALS — BP 133/66 | HR 46 | Temp 98.7°F | Resp 21 | Ht 63.0 in | Wt 207.0 lb

## 2019-05-07 DIAGNOSIS — Z85038 Personal history of other malignant neoplasm of large intestine: Secondary | ICD-10-CM

## 2019-05-07 DIAGNOSIS — K635 Polyp of colon: Secondary | ICD-10-CM | POA: Diagnosis not present

## 2019-05-07 DIAGNOSIS — D122 Benign neoplasm of ascending colon: Secondary | ICD-10-CM

## 2019-05-07 MED ORDER — SODIUM CHLORIDE 0.9 % IV SOLN: 500.0000 mL | Freq: Once | INTRAVENOUS | Status: DC

## 2019-05-07 NOTE — Op Note (Signed)
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                            03/2018 segmental colectomy showed no residual                            cancer. Medicines:                Monitored Anesthesia Care Procedure:                Pre-Anesthesia Assessment:                           - Prior to the procedure, a History and Physical                            was performed, and patient medications and                            allergies were reviewed. The patient's tolerance of                            previous anesthesia was also reviewed. The risks                            and benefits of the procedure and the sedation                            options and risks were discussed with the patient.                            All questions were answered, and informed consent                            was obtained. Prior Anticoagulants: The patient has                            taken no previous anticoagulant or antiplatelet  agents. ASA Grade Assessment: II - A patient with                            mild systemic disease. After reviewing the risks                            and benefits, the patient was deemed in                            satisfactory condition to undergo the procedure.                           After obtaining informed consent, the colonoscope                            was passed under direct vision. Throughout the             procedure, the patient's blood pressure, pulse, and                            oxygen saturations were monitored continuously. The                            Model PCF-H190DL [id number]) scope was introduced                            through the anus and advanced to the the cecum,                            identified by appendiceal orifice and ileocecal                            valve. The colonoscopy was performed without                            difficulty. The patient tolerated the procedure                            well. The quality of the bowel preparation was                            good. The ileocecal valve, appendiceal orifice, and                            rectum were photographed. Scope In: 7:35:13 AM Scope Out: 7:46:24 AM Scope Withdrawal Time: 0 hours 7 minutes 33 seconds  Total Procedure Duration: 0 hours 11 minutes 11 seconds  Findings:                 A 2 mm polyp was found in the ascending colon. The                            polyp was sessile. The polyp was removed with a  cold snare. Resection and retrieval were complete.                           The colo-colonic sigmoid anastomosis was normal                            appearing.                           The exam was otherwise without abnormality on                            direct and retroflexion views. Complications:            No immediate complications. Estimated blood loss:                            None. Estimated Blood Loss:     Estimated blood loss: none. Impression:               - One 2 mm polyp in the ascending colon, removed                            with a cold snare. Resected and retrieved.                           - The colo-colonic sigmoid anastomosis was normal                            appearing.                           - The examination was otherwise normal on direct                            and retroflexion views. Recommendation:            - Patient has a contact number available for                            emergencies. The signs and symptoms of potential                            delayed complications were discussed with the                            patient. Return to normal activities tomorrow.                            Written discharge instructions were provided to the                            patient.                           - Resume previous diet.                           -  Continue present medications.                           - Await pathology results. She will likely need a                            repeat colonoscopy in 3 years. Milus Banister, MD 05/07/2019 7:50:29 AM This report has been signed electronically.

## 2019-05-07 NOTE — Progress Notes (Signed)
Courtney Washington-Temp.,Judy Branson-Vital signs.

## 2019-05-07 NOTE — Progress Notes (Signed)
Called to room to assist during endoscopic procedure.  Patient ID and intended procedure confirmed with present staff. Received instructions for my participation in the procedure from the performing physician.  

## 2019-05-07 NOTE — Progress Notes (Signed)
To PACU, VSS. Report to [email or web address] 

## 2019-05-07 NOTE — Patient Instructions (Signed)
Information on polyps given to you today.  Await pathology results.   YOU HAD AN ENDOSCOPIC PROCEDURE TODAY AT THE [facility]:   Refer to the procedure report that was given to you for any specific questions about what was found during the examination.  If the procedure report does not answer your questions, please call your gastroenterologist to clarify.  If you requested that your care partner not be given the details of your procedure findings, then the procedure report has been included in a sealed envelope for you to review at your convenience later.  YOU SHOULD EXPECT: Some feelings of bloating in the abdomen. Passage of more gas than usual.  Walking can help get rid of the air that was put into your GI tract during the procedure and reduce the bloating. If you had a lower endoscopy (such as a colonoscopy or flexible sigmoidoscopy) you may notice spotting of blood in your stool or on the toilet paper. If you underwent a bowel prep for your procedure, you may not have a normal bowel movement for a few days.  Please Note:  You might notice some irritation and congestion in your nose or some drainage.  This is from the oxygen used during your procedure.  There is no need for concern and it should clear up in a day or so.  SYMPTOMS TO REPORT IMMEDIATELY:   Following lower endoscopy (colonoscopy or flexible sigmoidoscopy):  Excessive amounts of blood in the stool  Significant tenderness or worsening of abdominal pains  Swelling of the abdomen that is new, acute  Fever of 100F or higher   For urgent or emergent issues, a gastroenterologist can be reached at any hour by calling [phone].   DIET:  We do recommend a small meal at first, but then you may proceed to your regular diet.  Drink plenty of fluids but you should avoid alcoholic beverages for 24 hours.  ACTIVITY:  You should plan to take it easy for the rest of today and you should NOT DRIVE or use heavy machinery  until tomorrow (because of the sedation medicines used during the test).    FOLLOW UP: Our staff will call the number listed on your records 48-72 hours following your procedure to check on you and address any questions or concerns that you may have regarding the information given to you following your procedure. If we do not reach you, we will leave a message.  We will attempt to reach you two times.  During this call, we will ask if you have developed any symptoms of COVID 19. If you develop any symptoms (ie: fever, flu-like symptoms, shortness of breath, cough etc.) before then, please call [phone].  If you test positive for Covid 19 in the 2 weeks post procedure, please call and report this information to us.    If any biopsies were taken you will be contacted by phone or by letter within the next 1-3 weeks.  Please call us at [phone] if you have not heard about the biopsies in 3 weeks.    SIGNATURES/CONFIDENTIALITY: You and/or your care partner have signed paperwork which will be entered into your electronic medical record.  These signatures attest to the fact that that the information above on your After Visit Summary has been reviewed and is understood.  Full responsibility of the confidentiality of this discharge information lies with you and/or your care-partner. 

## 2019-05-07 NOTE — Progress Notes (Signed)
Pt's states no medical or surgical changes since previsit or office visit. 

## 2019-05-07 NOTE — Addendum Note (Signed)
Addended by: Saunders Revel on: 05/07/2019 08:33 AM   Modules accepted: Orders

## 2019-05-09 ENCOUNTER — Telehealth: Payer: Self-pay | Admitting: *Deleted

## 2019-05-09 NOTE — Telephone Encounter (Signed)
  Follow up Call-  Call back number 05/07/2019 02/16/2018  Post procedure Call Back phone  # [phone]  Permission to leave phone message Yes Yes  Some recent data might be hidden     Patient questions:  Do you have a fever, pain , or abdominal swelling? No. Pain Score  0 *  Have you tolerated food without any problems? Yes.    Have you been able to return to your normal activities? Yes.    Do you have any questions about your discharge instructions: Diet   No. Medications  No. Follow up visit  No.  Do you have questions or concerns about your Care? No.  Actions: * If pain score is 4 or above: No action needed, pain <4.  1. Have you developed a fever since your procedure? no  2.   Have you had an respiratory symptoms (SOB or cough) since your procedure? no  3.   Have you tested positive for COVID 19 since your procedure no  4.   Have you had any family members/close contacts diagnosed with the COVID 19 since your procedure?  no   If yes to any of these questions please route to Joylene John, RN and Alphonsa Gin, Therapist, sports.

## 2019-05-15 ENCOUNTER — Encounter: Payer: Self-pay | Admitting: Gastroenterology

## 2019-05-26 ENCOUNTER — Telehealth: Payer: BC Managed Care – PPO | Admitting: Family

## 2019-05-26 DIAGNOSIS — B373 Candidiasis of vulva and vagina: Secondary | ICD-10-CM

## 2019-05-26 DIAGNOSIS — B3731 Acute candidiasis of vulva and vagina: Secondary | ICD-10-CM

## 2019-05-26 MED ORDER — FLUCONAZOLE 150 MG PO TABS: 150.0000 mg | ORAL_TABLET | ORAL | 0 refills | Status: DC | PRN

## 2019-05-26 NOTE — Progress Notes (Signed)
We are sorry that you are not feeling well. Here is how we plan to help! Based on what you shared with me it looks like you: May have a yeast vaginosis.  Approximately 5 minutes was spent documenting and reviewing patient's chart.    Vaginosis is an inflammation of the vagina that can result in discharge, itching and pain. The cause is usually a change in the normal balance of vaginal bacteria or an infection. Vaginosis can also result from reduced estrogen levels after menopause.  The most common causes of vaginosis are:   Bacterial vaginosis which results from an overgrowth of one on several organisms that are normally present in your vagina.   Yeast infections which are caused by a naturally occurring fungus called candida.   Vaginal atrophy (atrophic vaginosis) which results from the thinning of the vagina from reduced estrogen levels after menopause.   Trichomoniasis which is caused by a parasite and is commonly transmitted by sexual intercourse.  Factors that increase your risk of developing vaginosis include: . Medications, such as antibiotics and steroids . Uncontrolled diabetes . Use of hygiene products such as bubble bath, vaginal spray or vaginal deodorant . Douching . Wearing damp or tight-fitting clothing . Using an intrauterine device (IUD) for birth control . Hormonal changes, such as those associated with pregnancy, birth control pills or menopause . Sexual activity . Having a sexually transmitted infection  Your treatment plan is A single Diflucan (fluconazole) 150mg tablet once.  I have electronically sent this prescription into the pharmacy that you have chosen.  Be sure to take all of the medication as directed. Stop taking any medication if you develop a rash, tongue swelling or shortness of breath. Mothers who are breast feeding should consider pumping and discarding their breast milk while on these antibiotics. However, there is no consensus that infant exposure  at these doses would be harmful.  Remember that medication creams can weaken latex condoms. .   HOME CARE:  Good hygiene may prevent some types of vaginosis from recurring and may relieve some symptoms:  . Avoid baths, hot tubs and whirlpool spas. Rinse soap from your outer genital area after a shower, and dry the area well to prevent irritation. Don't use scented or harsh soaps, such as those with deodorant or antibacterial action. . Avoid irritants. These include scented tampons and pads. . Wipe from front to back after using the toilet. Doing so avoids spreading fecal bacteria to your vagina.  Other things that may help prevent vaginosis include:  . Don't douche. Your vagina doesn't require cleansing other than normal bathing. Repetitive douching disrupts the normal organisms that reside in the vagina and can actually increase your risk of vaginal infection. Douching won't clear up a vaginal infection. . Use a latex condom. Both female and female latex condoms may help you avoid infections spread by sexual contact. . Wear cotton underwear. Also wear pantyhose with a cotton crotch. If you feel comfortable without it, skip wearing underwear to bed. Yeast thrives in moist environments Your symptoms should improve in the next day or two.  GET HELP RIGHT AWAY IF:  . You have pain in your lower abdomen ( pelvic area or over your ovaries) . You develop nausea or vomiting . You develop a fever . Your discharge changes or worsens . You have persistent pain with intercourse . You develop shortness of breath, a rapid pulse, or you faint.  These symptoms could be signs of problems or infections that need to   be evaluated by a medical provider now.  MAKE SURE YOU    Understand these instructions.  Will watch your condition.  Will get help right away if you are not doing well or get worse.  Your e-visit answers were reviewed by a board certified advanced clinical practitioner to complete  your personal care plan. Depending upon the condition, your plan could have included both over the counter or prescription medications. Please review your pharmacy choice to make sure that you have choses a pharmacy that is open for you to pick up any needed prescription, Your safety is important to us. If you have drug allergies check your prescription carefully.   You can use MyChart to ask questions about today's visit, request a non-urgent call back, or ask for a work or school excuse for 24 hours related to this e-Visit. If it has been greater than 24 hours you will need to follow up with your provider, or enter a new e-Visit to address those concerns. You will get a MyChart message within the next two days asking about your experience. I hope that your e-visit has been valuable and will speed your recovery.  

## 2019-08-02 ENCOUNTER — Other Ambulatory Visit: Payer: Self-pay | Admitting: Internal Medicine

## 2019-08-17 NOTE — Progress Notes (Addendum)
[facility] [address], [place], [place] [address] [phone]  Date:  08/21/2019   Name:  [name]   DOB:  [birth year]   MRN:  [id number]  PCP:  Darreld Mclean, MD    Chief Complaint: Annual Exam and Flu Vaccine   History of Present Illness:  [name] is a 68 y.o. very pleasant female patient who presents with the following:  Here today for routine physical History of dyslipidemia, prediabetes, hypertension, colon cancer status post sigmoid colectomy 2019, obesity, traumatic brain injury.  Never a smoker  Last seen by myself in May 2019  She had a thyroidectomy in 1992, right lobe only.  We did a follow-up thyroid ultrasound for her last year, no further follow-up needed Screening colonoscopy performed by Dr. Ardis Hughs in June of this year-plan repeat in 3 years Her cardiologist is Dr. Debara Pickett, saw him last in October 2019- she will see him soon for follow-up  She had a left breast mass lumpectomy, due to papilloma, 2017.  This is done per Dr. 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  She is just concerned about what it might be  She has some itchy scalp- she is using an OTC dandruff shampoo but it does not really help  She has let her hair go natural as of the last couple of years She washes her hair once a week   She is trying to get up  and walk for about 20 minutes in the am-she actually did start this yesterday, it did make her feel better Weight is stable    Wt Readings from Last 3 Encounters:  08/21/19 209 lb (94.8 kg)  05/07/19 207 lb (93.9 kg)  04/19/19 207 lb (93.9 kg)    Lab Results  Component Value Date   HGBA1C 5.5 04/12/2018   Lipids:    Component Value Date/Time   CHOL 150 12/03/2018 1133   CHOL 206 (H) 12/19/2013 0820   TRIG 87 12/03/2018 1133   TRIG 111 12/19/2013 0820   HDL 54 12/03/2018 1133   HDL 70 12/19/2013 0820   VLDL 17 08/08/2016 0828   CHOLHDL 2.8 12/03/2018 1133   CHOLHDL 3.0 08/08/2016 0828     Patient Active Problem List   Diagnosis Date Noted  . Colon cancer ([facility]) 04/18/2018  . Class 2 obesity with serious comorbidity and body mass index (BMI) of 38.0 to 38.9 in adult 07/27/2017  . Vitamin D deficiency 07/27/2017  . Prediabetes 06/29/2017  . Other fatigue 06/29/2017  . Shortness of breath on exertion 06/29/2017  . Class 2 obesity with serious comorbidity and body mass index (BMI) of 39.0 to 39.9 in adult 06/29/2017  . Left hip pain 04/18/2017  . Pre-diabetes 02/05/2016  . Left thyroid nodule 11/10/2014  . TBI (traumatic brain injury) (  [facility]) 07/15/2014  . Chest pain 12/13/2013  . Dyslipidemia 12/13/2013  . Obesity (BMI 35.0-39.9 without comorbidity) 12/13/2013  . Hypertension 01/18/2012  . GERD (gastroesophageal reflux disease) 01/18/2012  . Depression 01/18/2012  . Migraine syndrome 01/18/2012    Past Medical History:  Diagnosis Date  . Allergy   . Anemia   . Anxiety   . Arthritis   . Asthma   . Back pain   . Blood transfusion without reported diagnosis   . Cancer of sigmoid colon ([place])   . Constipation   . Depression   . Diabetes mellitus without complication ([facility])   . Dyspnea    with exertion  . Fatigue   . Fatty liver    mild  . GERD (gastroesophageal reflux disease)   . Grade I diastolic dysfunction    Echo 08/08/2017 with Grade I DD, EF 51%, Mild  Aortic regurgitation, Mild Tricuspid Regurgitation, Pulmonary pressure 30 mm Hg. She denies any chest pain, dyspnea, or peripheral edema.  . Headaches due to old head injury   . History of stomach ulcers   . Hyperlipidemia   . Hypertension   . Lactose intolerance   . Left hip pain   . Left thyroid nodule   . Lipoma of extremity 10/17/2017   Left lower  . Migraines   . Obesity   . TBI (traumatic brain injury) ([facility])   . Thyroid disease   . Vitamin D deficiency     Past Surgical History:  Procedure Laterality Date  . ABDOMINAL HYSTERECTOMY    . BREAST LUMPECTOMY WITH RADIOACTIVE SEED LOCALIZATION Left 08/23/2016   Procedure: LEFT BREAST LUMPECTOMY WITH RADIOACTIVE SEED LOCALIZATION;  Surgeon: Rolm Bookbinder, MD;  Location: [facility];  Service: General;  Laterality: Left;  . BREAST SURGERY  2003   reduction  . CESAREAN SECTION    . CHALAZION EXCISION Left 2010  . COLONOSCOPY W/ POLYPECTOMY  02/16/2018   Dr. Ardis Hughs  . FACET JOINT INJECTION  2011 and 2012  . FLEXIBLE SIGMOIDOSCOPY N/A 04/18/2018   Procedure: FLEXIBLE SIGMOIDOSCOPY;  Surgeon: Ileana Roup, MD;  Location: [facility];  Service: General;  Laterality: N/A;  . LAPAROSCOPIC SIGMOID COLECTOMY N/A 04/18/2018   Procedure: LAPAROSCOPIC ASSISTED SIGMOIDECTOMY;  Surgeon: Ileana Roup, MD;  Location: [facility];  Service: General;  Laterality: N/A;  . MYOMECTOMY    . NM MYOCAR PERF WALL MOTION  03/2009   dipyridamole; normal pattern of perfusion in all regions, post-stress EF 65%, normal study   . THYROIDECTOMY  1989   partial  . TRANSTHORACIC ECHOCARDIOGRAM  03/2009   EF=>55%, borderline conc LVH; mild mitral annular calcif, trace MR; trace TR; AV mildly sclerotic, mild AV regurg; mild pulm valve regurg   . TUBAL LIGATION      Social History   Tobacco Use  . Smoking status: Never Smoker  . Smokeless tobacco: Never Used  Substance Use Topics  . Alcohol use: Yes    Comment: occ. beer  . Drug use:  No    Family History  Problem Relation Age of Onset  . Heart disease Mother        MI, HTN  . Hypertension Mother   . Thyroid disease Mother   . Hyperlipidemia Mother   . Sudden death Mother   . Anxiety disorder Mother   . Heart disease Maternal Grandmother        MI, CVA  . Heart disease Maternal Grandfather        MI  . Stroke  Maternal Grandfather   . Heart disease Paternal Grandmother   . Stroke Paternal Grandmother   . Heart disease Paternal Grandfather        MI  . Glaucoma Father   . Diabetes Father   . Depression Daughter   . ADD / ADHD Son   . Thyroid disease Sister   . Heart attack Paternal Uncle        HTN  . Hypertension Paternal Uncle   . Colon cancer Neg Hx   . Stomach cancer Neg Hx   . Esophageal cancer Neg Hx   . Rectal cancer Neg Hx     Allergies  Allergen Reactions  . Hydrochlorothiazide Other (See Comments)    Increases risk of gout flares   . Botox [Botulinum Toxin Type A] Swelling  . Onabotulinumtoxina Swelling  . Toprol Xl [Metoprolol Succinate] Hives    Medication list has been reviewed and updated.  Current Outpatient Medications on File Prior to Visit  Medication Sig Dispense Refill  . ALPRAZolam (XANAX) 0.25 MG tablet Take 0.25 mg by mouth 2 (two) times daily as needed for anxiety.     Marland Kitchen amLODipine (NORVASC) 10 MG tablet TAKE 1 TABLET BY MOUTH DAILY 30 tablet 6  . carvedilol (COREG) 3.125 MG tablet TAKE 1 TABLET(3.125 MG) BY MOUTH TWICE DAILY 60 tablet 1  . cetirizine (ZYRTEC) 10 MG tablet Take 10 mg by mouth daily as needed for allergies.    Marland Kitchen desvenlafaxine (PRISTIQ) 100 MG 24 hr tablet Take 100 mg by mouth daily.    . diclofenac (FLECTOR) 1.3 % PTCH Place 1 patch onto the skin 2 (two) times daily as needed (pain).     . Diclofenac Potassium (CAMBIA) 50 MG PACK Take 50 mg by mouth daily as needed (migraines).     . diclofenac sodium (VOLTAREN) 1 % GEL Apply 2 g topically 4 (four) times daily. (Patient taking differently: Apply 2 g  topically daily as needed (back pain). ) 100 g 3  . fluconazole (DIFLUCAN) 150 MG tablet Take 1 tablet (150 mg total) by mouth every three (3) days as needed. 3 tablet 0  . frovatriptan (FROVA) 2.5 MG tablet Take 2.5 mg by mouth as needed for migraine. If recurs, may repeat after 2 hours. Max of 3 tabs in 24 hours.    . gabapentin (NEURONTIN) 300 MG capsule Take 300 mg by mouth 3 (three) times daily as needed (headaches).     Marland Kitchen ibuprofen (ADVIL,MOTRIN) 200 MG tablet Take 200 mg by mouth 3 (three) times daily as needed for headache or moderate pain.    Marland Kitchen ipratropium (ATROVENT) 0.03 % nasal spray Place 2 sprays into the nose 4 (four) times daily. (Patient taking differently: Place 2 sprays into the nose 4 (four) times daily as needed for rhinitis. ) 30 mL 1  . lisinopril (PRINIVIL,ZESTRIL) 20 MG tablet Take 1 tablet (20 mg total) by mouth daily. 90 tablet 0  . naproxen sodium (ALEVE) 220 MG tablet Take 220 mg by mouth daily as needed (pain).    . nitroGLYCERIN (NITROSTAT) 0.4 MG SL tablet Place 1 tablet (0.4 mg total) under the tongue every 5 (five) minutes as needed. 30 tablet 0  . OLANZapine (ZYPREXA) 5 MG tablet Take 5 mg by mouth at bedtime. Patient takes 2.5 mg. One tablet daily.    . Olopatadine HCl (PATADAY) 0.2 % SOLN Apply 1 drop daily as needed to eye (for itching). 2.5 mL 9  . omeprazole (PRILOSEC) 40 MG capsule Take 40 mg  by mouth daily.    . polyethylene glycol (MIRALAX / GLYCOLAX) packet Take 17 g by mouth daily.    . valACYclovir (VALTREX) 500 MG tablet TAKE 1 TABLET BY MOUTH TWICE DAILY. USE FOR 3 DAYS AS NEEDED FOR OUBREAK 30 tablet 1  . atorvastatin (LIPITOR) 40 MG tablet Take 1 tablet (40 mg total) by mouth daily. 90 tablet 3   No current facility-administered medications on file prior to visit.     Review of Systems:  As per HPI- otherwise negative. No fever or chills No CP or SOB   Physical Examination: Vitals:   08/21/19 0824  BP: (!) 142/86  Pulse: 85  Resp: 16   Temp: (!) 97.3 F (36.3 C)  SpO2: 97%   Vitals:   08/21/19 0824  Weight: 209 lb (94.8 kg)  Height: 5\' 3"  (1.6 m)   Body mass index is 37.02 kg/m. Ideal Body Weight: Weight in (lb) to have BMI = 25: 140.8  GEN: WDWN, NAD, Non-toxic, A & O x 3, obese, looks well  HEENT: Atraumatic, Normocephalic. Neck supple. No masses, No LAD.  TM wnl bilaterally PEERL  Ears and Nose: No external deformity. CV: RRR, No M/G/R. No JVD. No thrill. No extra heart sounds. PULM: CTA B, no wheezes, crackles, rhonchi. No retractions. No resp. distress. No accessory muscle use. ABD: S, NT, ND, +BS. No rebound. No HSM. EXTR: No c/c/e NEURO Normal gait.  PSYCH: Normally interactive. Conversant. Not depressed or anxious appearing.  Calm demeanor.  Both feet display strong pulses.  No swelling, cyanosis, feet are warm and well-perfused.  The left foot does display an elongated, firm mass on the dorsum of the foot.  It is about 3 cm in length, it feels firm like a cyst of some sort.  It is not tender Her scalp is dry, there is some dandruff present   Assessment and Plan: Physical exam  Thyroid enlargement - Plan: TSH  Malignant neoplasm of colon, unspecified part of colon ([place])  Essential hypertension - Plan: CBC, Comprehensive metabolic panel  Vitamin D deficiency - Plan: Vitamin D (25 hydroxy)  Class 2 severe obesity with serious comorbidity and body mass index (BMI) of 35.0 to 35.9 in adult, unspecified obesity type ([facility])  Dyslipidemia - Plan: Lipid panel  Screening for diabetes mellitus - Plan: Hemoglobin A1c  Immunization due - Plan: Pneumococcal polysaccharide vaccine 23-valent greater than or equal to 2yo subcutaneous/IM, Flu Vaccine QUAD High Dose(Fluad)  Estrogen deficiency - Plan: DG Bone Density  Mass of left foot - Plan: [place] LT LOWER EXTREM LTD SOFT TISSUE NON VASCULAR  Dry scalp - Plan: Fluocinolone Acetonide Body (DERMA-SMOOTHE/FS BODY) 0.01 % OIL  Here today for complete  physical Labs pending as above History of colon cancer, she is up-to-date on her screening Given Pneumovax and flu shots today, she will get Shingrix at her drugstore We will have her try Derma-Smoothe oil for her scalp We will set up an ultrasound to try to identify the mass on her left foot.  I think it is likely benign but is somewhat unusual Encouraged her to work on increased exercise and weight loss Continue medication for hypertension Will plan further follow- up pending labs.   Signed Lamar Blinks, MD  Received her labs, message to patient She is not currently on thyroid replacement Results for orders placed or performed in visit on 08/21/19  CBC  Result Value Ref Range   WBC 4.4 4.0 - 10.5 K/uL   RBC 4.69 3.87 -  5.11 Mil/uL   Platelets 273.0 150.0 - 400.0 K/uL   Hemoglobin 14.2 12.0 - 15.0 g/dL   HCT 41.9 36.0 - 46.0 %   MCV 89.3 78.0 - 100.0 fl   MCHC 33.9 30.0 - 36.0 g/dL   RDW 14.1 11.5 - 15.5 %  Comprehensive metabolic panel  Result Value Ref Range   Sodium 141 135 - 145 mEq/L   Potassium 3.7 3.5 - 5.1 mEq/L   Chloride 101 96 - 112 mEq/L   CO2 32 19 - 32 mEq/L   Glucose, Bld 96 70 - 99 mg/dL   BUN 10 6 - 23 mg/dL   Creatinine, Ser 0.60 0.40 - 1.20 mg/dL   Total Bilirubin 0.7 0.2 - 1.2 mg/dL   Alkaline Phosphatase 79 39 - 117 U/L   AST 17 0 - 37 U/L   ALT 16 0 - 35 U/L   Total Protein 7.1 6.0 - 8.3 g/dL   Albumin 4.1 3.5 - 5.2 g/dL   Calcium 9.3 8.4 - 10.5 mg/dL   GFR 120.72 >60.00 mL/min  Hemoglobin A1c  Result Value Ref Range   Hgb A1c MFr Bld 5.9 4.6 - 6.5 %  Lipid panel  Result Value Ref Range   Cholesterol 141 0 - 200 mg/dL   Triglycerides 82.0 0.0 - 149.0 mg/dL   HDL 50.60 >39.00 mg/dL   VLDL 16.4 0.0 - 40.0 mg/dL   LDL Cholesterol 74 0 - 99 mg/dL   Total CHOL/HDL Ratio 3    NonHDL 90.64   TSH  Result Value Ref Range   TSH 0.27 (L) 0.35 - 4.50 uIU/mL  Vitamin D (25 hydroxy)  Result Value Ref Range   VITD 24.14 (L) 30.00 - 100.00 ng/mL      Blood counts are normal Metabolic profile is normal A1c-average blood sugar the previous 3 months-is in the prediabetes range.  This means you may be at greater risk for progression to diabetes later on.  Weight loss and exercise is your best defense against diabetes!  Cholesterol looks good-continue Lipitor The 10-year ASCVD risk score Mikey Bussing DC Jr., et al., 2013) is: 8.5%   Values used to calculate the score:     Age: 63 years     Sex: Female     Is Non-Hispanic African American: Yes     Diabetic: No     Tobacco smoker: No     Systolic Blood Pressure: 0000000 mmHg     Is BP treated: Yes     HDL Cholesterol: 50.6 mg/dL     Total Cholesterol: 141 mg/dL  Your TSH level is slightly low-this may indicate that your thyroid gland is overactive.  Your thyroid can have some significant detrimental effects-I would like to recheck your TSH and also a T3/T4 level in about 1 month.  I will order these labs for you, please schedule a lab visit at your convenience so we can follow-up  Vitamin D is slightly low.  I would suggest 2000 international units of vitamin D daily

## 2019-08-17 NOTE — Patient Instructions (Addendum)
It was great to see you again today, I will be in touch with your labs ASAP. Your got your flu shot and pneumonia booster today Please consider having the shingles vaccine, called Shingrix, given at your drugstore; this is a 2 shot series with doses given 2-6 months apart I ordered a bone density scan for you- please schedule this with your mammogram this fall Will also set up an ultrasound of the mass on your left foot - I don't think it is anything dangerous but we will try and find out what it is    Health Maintenance After Age 17 After age 31, you are at a higher risk for certain long-term diseases and infections as well as injuries from falls. Falls are a major cause of broken bones and head injuries in people who are older than age 15. Getting regular preventive care can help to keep you healthy and well. Preventive care includes getting regular testing and making lifestyle changes as recommended by your health care provider. Talk with your health care provider about:  Which screenings and tests you should have. A screening is a test that checks for a disease when you have no symptoms.  A diet and exercise plan that is right for you. What should I know about screenings and tests to prevent falls? Screening and testing are the best ways to find a health problem early. Early diagnosis and treatment give you the best chance of managing medical conditions that are common after age 24. Certain conditions and lifestyle choices may make you more likely to have a fall. Your health care provider may recommend:  Regular vision checks. Poor vision and conditions such as cataracts can make you more likely to have a fall. If you wear glasses, make sure to get your prescription updated if your vision changes.  Medicine review. Work with your health care provider to regularly review all of the medicines you are taking, including over-the-counter medicines. Ask your health care provider about any side  effects that may make you more likely to have a fall. Tell your health care provider if any medicines that you take make you feel dizzy or sleepy.  Osteoporosis screening. Osteoporosis is a condition that causes the bones to get weaker. This can make the bones weak and cause them to break more easily.  Blood pressure screening. Blood pressure changes and medicines to control blood pressure can make you feel dizzy.  Strength and balance checks. Your health care provider may recommend certain tests to check your strength and balance while standing, walking, or changing positions.  Foot health exam. Foot pain and numbness, as well as not wearing proper footwear, can make you more likely to have a fall.  Depression screening. You may be more likely to have a fall if you have a fear of falling, feel emotionally low, or feel unable to do activities that you used to do.  Alcohol use screening. Using too much alcohol can affect your balance and may make you more likely to have a fall. What actions can I take to lower my risk of falls? General instructions  Talk with your health care provider about your risks for falling. Tell your health care provider if: ? You fall. Be sure to tell your health care provider about all falls, even ones that seem minor. ? You feel dizzy, sleepy, or off-balance.  Take over-the-counter and prescription medicines only as told by your health care provider. These include any supplements.  Eat a healthy  diet and maintain a healthy weight. A healthy diet includes low-fat dairy products, low-fat (lean) meats, and fiber from whole grains, beans, and lots of fruits and vegetables. Home safety  Remove any tripping hazards, such as rugs, cords, and clutter.  Install safety equipment such as grab bars in bathrooms and safety rails on stairs.  Keep rooms and walkways well-lit. Activity   Follow a regular exercise program to stay fit. This will help you maintain your  balance. Ask your health care provider what types of exercise are appropriate for you.  If you need a cane or walker, use it as recommended by your health care provider.  Wear supportive shoes that have nonskid soles. Lifestyle  Do not drink alcohol if your health care provider tells you not to drink.  If you drink alcohol, limit how much you have: ? 0-1 drink a day for women. ? 0-2 drinks a day for men.  Be aware of how much alcohol is in your drink. In the [place] one drink equals one typical bottle of beer (12 oz), one-half glass of wine (5 oz), or one shot of hard liquor (1 oz).  Do not use any products that contain nicotine or tobacco, such as cigarettes and e-cigarettes. If you need help quitting, ask your health care provider. Summary  Having a healthy lifestyle and getting preventive care can help to protect your health and wellness after age 80.  Screening and testing are the best way to find a health problem early and help you avoid having a fall. Early diagnosis and treatment give you the best chance for managing medical conditions that are more common for people who are older than age 10.  Falls are a major cause of broken bones and head injuries in people who are older than age 66. Take precautions to prevent a fall at home.  Work with your health care provider to learn what changes you can make to improve your health and wellness and to prevent falls. This information is not intended to replace advice given to you by your health care provider. Make sure you discuss any questions you have with your health care provider. Document Released: 09/27/2017 Document Revised: 03/07/2019 Document Reviewed: 09/27/2017 Elsevier Patient Education  2020 [employer].

## 2019-08-20 ENCOUNTER — Other Ambulatory Visit: Payer: Self-pay

## 2019-08-21 ENCOUNTER — Other Ambulatory Visit: Payer: Self-pay

## 2019-08-21 ENCOUNTER — Encounter: Payer: Self-pay | Admitting: Family Medicine

## 2019-08-21 ENCOUNTER — Ambulatory Visit (INDEPENDENT_AMBULATORY_CARE_PROVIDER_SITE_OTHER): Payer: BC Managed Care – PPO | Admitting: Family Medicine

## 2019-08-21 VITALS — BP 138/88 | HR 85 | Temp 97.3°F | Resp 16 | Ht 63.0 in | Wt 209.0 lb

## 2019-08-21 DIAGNOSIS — E785 Hyperlipidemia, unspecified: Secondary | ICD-10-CM | POA: Diagnosis not present

## 2019-08-21 DIAGNOSIS — E2839 Other primary ovarian failure: Secondary | ICD-10-CM

## 2019-08-21 DIAGNOSIS — E049 Nontoxic goiter, unspecified: Secondary | ICD-10-CM | POA: Diagnosis not present

## 2019-08-21 DIAGNOSIS — Z6835 Body mass index (BMI) 35.0-35.9, adult: Secondary | ICD-10-CM

## 2019-08-21 DIAGNOSIS — Z23 Encounter for immunization: Secondary | ICD-10-CM

## 2019-08-21 DIAGNOSIS — Z131 Encounter for screening for diabetes mellitus: Secondary | ICD-10-CM | POA: Diagnosis not present

## 2019-08-21 DIAGNOSIS — E559 Vitamin D deficiency, unspecified: Secondary | ICD-10-CM

## 2019-08-21 DIAGNOSIS — R7989 Other specified abnormal findings of blood chemistry: Secondary | ICD-10-CM

## 2019-08-21 DIAGNOSIS — I10 Essential (primary) hypertension: Secondary | ICD-10-CM

## 2019-08-21 DIAGNOSIS — C189 Malignant neoplasm of colon, unspecified: Secondary | ICD-10-CM

## 2019-08-21 DIAGNOSIS — Z0000 Encounter for general adult medical examination without abnormal findings: Secondary | ICD-10-CM | POA: Diagnosis not present

## 2019-08-21 DIAGNOSIS — R238 Other skin changes: Secondary | ICD-10-CM

## 2019-08-21 DIAGNOSIS — R2242 Localized swelling, mass and lump, left lower limb: Secondary | ICD-10-CM

## 2019-08-21 LAB — COMPREHENSIVE METABOLIC PANEL
ALT: 16 U/L (ref 0–35)
AST: 17 U/L (ref 0–37)
Albumin: 4.1 g/dL (ref 3.5–5.2)
Alkaline Phosphatase: 79 U/L (ref 39–117)
BUN: 10 mg/dL (ref 6–23)
CO2: 32 mEq/L (ref 19–32)
Calcium: 9.3 mg/dL (ref 8.4–10.5)
Chloride: 101 mEq/L (ref 96–112)
Creatinine, Ser: 0.6 mg/dL (ref 0.40–1.20)
GFR: 120.72 mL/min (ref >60.00)
Glucose, Bld: 96 mg/dL (ref 70–99)
Potassium: 3.7 mEq/L (ref 3.5–5.1)
Sodium: 141 mEq/L (ref 135–145)
Total Bilirubin: 0.7 mg/dL (ref 0.2–1.2)
Total Protein: 7.1 g/dL (ref 6.0–8.3)

## 2019-08-21 LAB — LIPID PANEL
Cholesterol: 141 mg/dL (ref 0–200)
HDL: 50.6 mg/dL (ref >39.00)
LDL Cholesterol: 74 mg/dL (ref 0–99)
NonHDL: 90.64
Total CHOL/HDL Ratio: 3
Triglycerides: 82 mg/dL (ref 0.0–149.0)
VLDL: 16.4 mg/dL (ref 0.0–40.0)

## 2019-08-21 LAB — HEMOGLOBIN A1C: Hgb A1c MFr Bld: 5.9 % (ref 4.6–6.5)

## 2019-08-21 LAB — CBC
HCT: 41.9 % (ref 36.0–46.0)
Hemoglobin: 14.2 g/dL (ref 12.0–15.0)
MCHC: 33.9 g/dL (ref 30.0–36.0)
MCV: 89.3 fl (ref 78.0–100.0)
Platelets: 273 10*3/uL (ref 150.0–400.0)
RBC: 4.69 Mil/uL (ref 3.87–5.11)
RDW: 14.1 % (ref 11.5–15.5)
WBC: 4.4 10*3/uL (ref 4.0–10.5)

## 2019-08-21 LAB — VITAMIN D 25 HYDROXY (VIT D DEFICIENCY, FRACTURES): VITD: 24.14 ng/mL — ABNORMAL LOW (ref 30.00–100.00)

## 2019-08-21 LAB — TSH: TSH: 0.27 u[IU]/mL — ABNORMAL LOW (ref 0.35–4.50)

## 2019-08-21 MED ORDER — FLUOCINOLONE ACETONIDE BODY 0.01 % EX OIL: 1.0000 "application " | TOPICAL_OIL | Freq: Every day | CUTANEOUS | 2 refills | Status: DC | PRN

## 2019-08-21 NOTE — Addendum Note (Signed)
Addended by: Lamar Blinks C on: 08/21/2019 12:27 PM   Modules accepted: Orders

## 2019-09-11 ENCOUNTER — Ambulatory Visit
Admission: RE | Admit: 2019-09-11 | Discharge: 2019-09-11 | Disposition: A | Discharge status: Home / Self Care | Payer: BC Managed Care – PPO | Source: Ambulatory Visit | Attending: Family Medicine | Admitting: Family Medicine

## 2019-09-11 DIAGNOSIS — R2242 Localized swelling, mass and lump, left lower limb: Secondary | ICD-10-CM

## 2019-09-12 NOTE — Telephone Encounter (Signed)
Called her to go over recent ultrasound  [place] Lt Lower Extrem Ltd Soft Tissue Non Vascular  Result Date: 09/11/2019 CLINICAL DATA:  Palpable area over anterior foot, several months started small and increased in size. EXAM: ULTRASOUND left LOWER EXTREMITY LIMITED TECHNIQUE: Ultrasound examination of the lower extremity soft tissues was performed in the area of clinical concern. COMPARISON:  Plain-film evaluation of the left foot of 09/03/2006 FINDINGS: Focused ultrasound of a palpable area over the anterior/dorsum of the foot was performed. This demonstrates an approximately 3.4 x 0.5 x 0.9 cm complex area just beneath the skin surface, associated with thick-walled mildly dilated tubular structures, likely venous structures in the superficial soft tissues. No signs of organized fluid collection. IMPRESSION: Thickened appearance of subcutaneous tissues perhaps associated with dilated thick-walled venous channels, perhaps secondary to phlebitis or venous stasis. Cellulitis would also be difficult to exclude based on imaging peer Ince alone does reportedly painless. Correlation with plain film examination and perhaps lower extremity venous Doppler may be helpful. These results will be called to the ordering clinician or representative by the Radiologist Assistant, and communication documented in the PACS or zVision Dashboard. Electronically Signed   By: Zetta Bills M.D.   On: 09/11/2019 15:24   Results are non specific Mass continues to be non -tender; I do not think this is cellulitis  No swelling or tenderness of the leg to suggest DVT Offered general surgery appt, LE [place], vs observation and recheck in 6 months She would like to observe, will see me in about 6 months but will alert me sooner if any change or worsening  Will ask my CMA to call her and schedule a lab visit for recheck thyroid labs - orders are placed

## 2019-09-13 NOTE — Telephone Encounter (Signed)
Labs have been scheduled. 

## 2019-09-18 ENCOUNTER — Other Ambulatory Visit: Payer: Self-pay

## 2019-09-18 ENCOUNTER — Encounter: Payer: Self-pay | Admitting: Family Medicine

## 2019-09-18 ENCOUNTER — Other Ambulatory Visit (INDEPENDENT_AMBULATORY_CARE_PROVIDER_SITE_OTHER): Payer: BC Managed Care – PPO

## 2019-09-18 DIAGNOSIS — R7989 Other specified abnormal findings of blood chemistry: Secondary | ICD-10-CM

## 2019-09-18 LAB — T4: T4, Total: 7.4 ug/dL (ref 5.1–11.9)

## 2019-09-18 LAB — T3, FREE: T3, Free: 3.7 pg/mL (ref 2.3–4.2)

## 2019-09-18 LAB — TSH: TSH: 0.74 u[IU]/mL (ref 0.35–4.50)

## 2019-09-30 ENCOUNTER — Other Ambulatory Visit: Payer: Self-pay | Admitting: Cardiology

## 2019-09-30 DIAGNOSIS — I10 Essential (primary) hypertension: Secondary | ICD-10-CM

## 2019-10-04 ENCOUNTER — Encounter: Payer: Self-pay | Admitting: Internal Medicine

## 2019-10-04 ENCOUNTER — Ambulatory Visit: Payer: BC Managed Care – PPO | Admitting: Internal Medicine

## 2019-10-04 ENCOUNTER — Other Ambulatory Visit: Payer: Self-pay

## 2019-10-04 VITALS — BP 163/85 | HR 61 | Temp 97.9°F | Ht 63.0 in | Wt 220.0 lb

## 2019-10-04 DIAGNOSIS — E669 Obesity, unspecified: Secondary | ICD-10-CM

## 2019-10-04 DIAGNOSIS — E782 Mixed hyperlipidemia: Secondary | ICD-10-CM

## 2019-10-04 DIAGNOSIS — M79606 Pain in leg, unspecified: Secondary | ICD-10-CM | POA: Diagnosis not present

## 2019-10-04 DIAGNOSIS — I10 Essential (primary) hypertension: Secondary | ICD-10-CM | POA: Diagnosis not present

## 2019-10-04 MED ORDER — VALSARTAN 320 MG PO TABS: 320.0000 mg | ORAL_TABLET | Freq: Every day | ORAL | 3 refills | Status: DC

## 2019-10-04 NOTE — Patient Instructions (Signed)
Medication Instructions:  STOP lisinopril  START valsartan 320mg  daily Continue other current medications *If you need a refill on your cardiac medications before your next appointment, please call your pharmacy*  Lab Work: NONE If you have labs (blood work) drawn today and your tests are completely normal, you will receive your results only by: Marland Kitchen MyChart Message (if you have MyChart) OR . A paper copy in the mail If you have any lab test that is abnormal or we need to change your treatment, we will call you to review the results.  Testing/Procedures: ABI - leg doppler (for screening)  Follow-Up: At [facility], you and your health needs are our priority.  As part of our continuing mission to provide you with exceptional heart care, we have created designated Provider Care Teams.  These Care Teams include your primary Cardiologist (physician) and Advanced Practice Providers (APPs -  Physician Assistants and Nurse Practitioners) who all work together to provide you with the care you need, when you need it.  Your next appointment:   1-2 months  The format for your next appointment:   Virtual Visit   Provider:   K. Mali Hilty, MD  Other Instructions Please monitor BP at home  [place]:  Rest 5 minutes before taking your blood pressure.  Don't smoke or drink caffeinated beverages for at least 30 minutes before.  Take your blood pressure before (not after) you eat.  Sit comfortably with your back supported and both feet on the floor (don't cross your legs).  Elevate your arm to heart level on a table or a desk.  Use the proper sized cuff. It should fit smoothly and snugly around your bare upper arm. There should be enough room to slip a fingertip under the cuff. The bottom edge of the cuff should be 1 inch above the crease of the elbow.  Ideally, take 3 measurements at one sitting and record the average.

## 2019-10-04 NOTE — Progress Notes (Signed)
OFFICE NOTE  Chief Complaint:  Leg pain, elevated BP  Primary Care Physician: Darreld Mclean, MD  HPI:  [name] is a pleasant 68 year old female with a strong family history of heart disease. Her mother died of a sudden heart attack at age 5, her both grandparents had heart disease and a grandmother had a stroke at age 1. She was seen remotely by cardiologist more than 3 years ago and had stress testing at that time which was negative. Back in November she was complaining of chest pain which was intermittent. It was somewhat worse at that time however she had an active bronchitis and was treated. Her symptoms improved somewhat however she still had some chest pain since that time. Some of it is associated with exertion and relieved by rest and other times it comes fairly randomly. It does feel like a squeezing sensation in the upper chest it does not radiate to the neck, arm or back. She describes the discomfort as a 5 or 6/10 in severity.  She occasionally takes nitroglycerin for this however her old prescription was only recently renewed.  She is desiring to start an exercise program and would like cardiovascular evaluation and risk stratification prior to that.  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  [name] had a recent nuclear stress test which was negative.  This demonstrated no reversible ischemia. She also had a lipid NMR performed, which demonstrated elevated LDL cholesterol of 117, with elevated LDL particle numbers to 1347.  Her contents a metabolic profile was normal.  Her blood pressure today is much improved. She denies any further chest pain and only occasionally has migraine headaches. We have not assessed her cholesterol since her last lipid profile and she's not on  treatment for her elevated cholesterol.  I saw [name] back in the office today. Her main complaint is some discomfort over the right trapezius area. She says this is usually worse with stress and tension. She denies any chest pain per se today. Recently her headaches have improved somewhat. Her cholesterol has been fairly well-controlled. Her blood pressure is at goal today. Unfortunate she's not managed to lose much weight and remains close to morbid obesity with BMI of 38.  08/02/2016  Returns today for follow-up. Over the past year she's had no complaints. Recently she was found to have a breast mass and is undergoing a biopsy later this month. She's not had a recent cholesterol check in the past year but had a fairly good cholesterol profile last saw her. She is continue to work on weight loss. She's on medication for depression which she says is working well. Her only issue right now is memory loss.  08/03/2017  [name] was seen today in follow-up. She has a migraine today. Overall though her migraines have improved some from her history of traumatic brain injury. She's taking less medication for that. She is more committed to primary prevention. Her lipid profile has improved some with weight loss recently [facility] Dr. Leafy Ro. She is optimistic about further weight loss which should be helpful for her risk reduction. Blood pressure is elevated today however possibly attributable to her migraine. Her last lipid profile showed an LDL of 119. She's not on a  statin and is a borderline diabetic on metformin. She has a significant family history of early onset coronary disease. She denies chest pain or shortness of breath with exertion.  09/14/2018  [name] is seen today in annual follow-up.  Unfortunately she recently was diagnosed with colon cancer.  She underwent surgical resection and is deemed cured of it.  She does have  follow-up in a year.  She did not require chemotherapy or radiation.  She was working on weight loss and was seen Dr. Leafy Ro however after colon surgery she could not tolerate the diet she was supposed to be on.  She had lost weight but then gained it back.  Overall she is feeling well.  Her blood sugars were borderline and she remained on metformin.  We discussed the importance of lowering her cholesterol which have been elevated with LDL at 130.  Earlier this year her LDL had come down to 118 with weight loss and dietary changes however she does not think that she will be able to make the significant dietary changes she needs to get it lower.  10/04/2019  Mrs. Lois Huxley returns today for follow-up.  Overall she is under quite a bit of stress.  She is noted her blood pressures have been high recently, even higher than it was today 163/85.  She occasionally gets some right-sided chest discomfort which is worse when she is stressed.  She does not note any symptoms noticed necessarily when walking or exerting herself.  She does like to walk for exercise.  She can walk quite a distance without any leg pain but occasionally gets shooting pains in her legs.  She also has noted some numbness and tingling in her feet.  She is on gabapentin which she takes for headaches but has no diagnosis of neuropathy.  She is free of colon cancer now more than a year out.  CT of the chest for follow-up of cancer did not demonstrate any evidence of coronary calcification that I saw.  Recent lipid showed total cholesterol 140, triglycerides 82, HDL 50 and LDL 74.   PMHx:  Past Medical History:  Diagnosis Date  . Allergy   . Anemia   . Anxiety   . Arthritis   . Asthma   . Back pain   . Blood transfusion without reported diagnosis   . Cancer of sigmoid colon ([place])   . Constipation   . Depression   . Diabetes mellitus without complication ([place])   . Dyspnea    with exertion  . Fatigue   . Fatty liver    mild  . GERD  (gastroesophageal reflux disease)   . Grade I diastolic dysfunction    Echo 08/08/2017 with Grade I DD, EF 51%, Mild Aortic regurgitation, Mild Tricuspid Regurgitation, Pulmonary pressure 30 mm Hg. She denies any chest pain, dyspnea, or peripheral edema.  . Headaches due to old head injury   . History of stomach ulcers   . Hyperlipidemia   . Hypertension   . Lactose intolerance   . Left hip pain   . Left thyroid nodule   . Lipoma of extremity 10/17/2017   Left lower  . Migraines   . Obesity   . TBI (traumatic brain injury) ([place])   . Thyroid disease   . Vitamin D deficiency     Past Surgical History:  Procedure Laterality Date  . ABDOMINAL HYSTERECTOMY    . BREAST LUMPECTOMY WITH RADIOACTIVE SEED LOCALIZATION Left 08/23/2016   Procedure: LEFT BREAST LUMPECTOMY WITH RADIOACTIVE  SEED LOCALIZATION;  Surgeon: Rolm Bookbinder, MD;  Location: [facility];  Service: General;  Laterality: Left;  . BREAST SURGERY  2003   reduction  . CESAREAN SECTION    . CHALAZION EXCISION Left 2010  . COLONOSCOPY W/ POLYPECTOMY  02/16/2018   Dr. Ardis Hughs  . FACET JOINT INJECTION  2011 and 2012  . FLEXIBLE SIGMOIDOSCOPY N/A 04/18/2018   Procedure: FLEXIBLE SIGMOIDOSCOPY;  Surgeon: Ileana Roup, MD;  Location: [facility];  Service: General;  Laterality: N/A;  . LAPAROSCOPIC SIGMOID COLECTOMY N/A 04/18/2018   Procedure: LAPAROSCOPIC ASSISTED SIGMOIDECTOMY;  Surgeon: Ileana Roup, MD;  Location: [facility];  Service: General;  Laterality: N/A;  . MYOMECTOMY    . NM MYOCAR PERF WALL MOTION  03/2009   dipyridamole; normal pattern of perfusion in all regions, post-stress EF 65%, normal study   . THYROIDECTOMY  1989   partial  . TRANSTHORACIC ECHOCARDIOGRAM  03/2009   EF=>55%, borderline conc LVH; mild mitral annular calcif, trace MR; trace TR; AV mildly sclerotic, mild AV regurg; mild pulm valve regurg   . TUBAL LIGATION      FAMHx:  Family History  Problem Relation Age of Onset  .  Heart disease Mother        MI, HTN  . Hypertension Mother   . Thyroid disease Mother   . Hyperlipidemia Mother   . Sudden death Mother   . Anxiety disorder Mother   . Heart disease Maternal Grandmother        MI, CVA  . Heart disease Maternal Grandfather        MI  . Stroke Maternal Grandfather   . Heart disease Paternal Grandmother   . Stroke Paternal Grandmother   . Heart disease Paternal Grandfather        MI  . Glaucoma Father   . Diabetes Father   . Depression Daughter   . ADD / ADHD Son   . Thyroid disease Sister   . Heart attack Paternal Uncle        HTN  . Hypertension Paternal Uncle   . Colon cancer Neg Hx   . Stomach cancer Neg Hx   . Esophageal cancer Neg Hx   . Rectal cancer Neg Hx     SOCHx:   reports that she has never smoked. She has never used smokeless tobacco. She reports current alcohol use. She reports that she does not use drugs.  ALLERGIES:  Allergies  Allergen Reactions  . Hydrochlorothiazide Other (See Comments)    Increases risk of gout flares   . Botox [Botulinum Toxin Type A] Swelling  . Onabotulinumtoxina Swelling  . Toprol Xl [Metoprolol Succinate] Hives    ROS: Pertinent items noted in HPI and remainder of comprehensive ROS otherwise negative.  HOME MEDS: Current Outpatient Medications  Medication Sig Dispense Refill  . ALPRAZolam (XANAX) 0.25 MG tablet Take 0.25 mg by mouth 2 (two) times daily as needed for anxiety.     Marland Kitchen amLODipine (NORVASC) 10 MG tablet TAKE 1 TABLET BY MOUTH DAILY 30 tablet 6  . carvedilol (COREG) 3.125 MG tablet TAKE 1 TABLET(3.125 MG) BY MOUTH TWICE DAILY 60 tablet 1  . cetirizine (ZYRTEC) 10 MG tablet Take 10 mg by mouth daily as needed for allergies.    Marland Kitchen desvenlafaxine (PRISTIQ) 100 MG 24 hr tablet Take 100 mg by mouth daily.    . diclofenac (FLECTOR) 1.3 % PTCH Place 1 patch onto the skin 2 (two) times daily as needed (pain).     Marland Kitchen  Diclofenac Potassium (CAMBIA) 50 MG PACK Take 50 mg by mouth daily as  needed (migraines).     . diclofenac sodium (VOLTAREN) 1 % GEL Apply 2 g topically 4 (four) times daily. (Patient taking differently: Apply 2 g topically daily as needed (back pain). ) 100 g 3  . fluconazole (DIFLUCAN) 150 MG tablet Take 1 tablet (150 mg total) by mouth every three (3) days as needed. 3 tablet 0  . Fluocinolone Acetonide Body (DERMA-SMOOTHE/FS BODY) 0.01 % OIL Apply 1 application topically daily as needed. Leave on for 4 hours or overnight, then shampoo scalp 118.28 mL 2  . frovatriptan (FROVA) 2.5 MG tablet Take 2.5 mg by mouth as needed for migraine. If recurs, may repeat after 2 hours. Max of 3 tabs in 24 hours.    . gabapentin (NEURONTIN) 300 MG capsule Take 300 mg by mouth 3 (three) times daily as needed (headaches).     Marland Kitchen ibuprofen (ADVIL,MOTRIN) 200 MG tablet Take 200 mg by mouth 3 (three) times daily as needed for headache or moderate pain.    Marland Kitchen ipratropium (ATROVENT) 0.03 % nasal spray Place 2 sprays into the nose 4 (four) times daily. (Patient taking differently: Place 2 sprays into the nose 4 (four) times daily as needed for rhinitis. ) 30 mL 1  . lisinopril (ZESTRIL) 20 MG tablet TAKE 1 TABLET(20 MG) BY MOUTH DAILY 90 tablet 0  . naproxen sodium (ALEVE) 220 MG tablet Take 220 mg by mouth daily as needed (pain).    . nitroGLYCERIN (NITROSTAT) 0.4 MG SL tablet Place 1 tablet (0.4 mg total) under the tongue every 5 (five) minutes as needed. 30 tablet 0  . OLANZapine (ZYPREXA) 5 MG tablet Take 5 mg by mouth at bedtime. Patient takes 2.5 mg. One tablet daily.    . Olopatadine HCl (PATADAY) 0.2 % SOLN Apply 1 drop daily as needed to eye (for itching). 2.5 mL 9  . omeprazole (PRILOSEC) 40 MG capsule Take 40 mg by mouth daily.    . polyethylene glycol (MIRALAX / GLYCOLAX) packet Take 17 g by mouth daily.    . valACYclovir (VALTREX) 500 MG tablet TAKE 1 TABLET BY MOUTH TWICE DAILY. USE FOR 3 DAYS AS NEEDED FOR OUBREAK 30 tablet 1  . atorvastatin (LIPITOR) 40 MG tablet Take 1  tablet (40 mg total) by mouth daily. 90 tablet 3   No current facility-administered medications for this visit.     LABS/IMAGING: No results found for this or any previous visit (from the past 48 hour(s)). No results found.  VITALS: BP (!) 163/85   Pulse 61   Temp 97.9 F (36.6 C)   Ht 5\' 3"  (1.6 m)   Wt 220 lb (99.8 kg)   SpO2 91%   BMI 38.97 kg/m   EXAM: General appearance: alert, no distress and moderately obese Neck: no carotid bruit and no JVD Lungs: clear to auscultation bilaterally Heart: regular rate and rhythm, S1, S2 normal, no murmur, click, rub or gallop Abdomen: soft, non-tender; bowel sounds normal; no masses,  no organomegaly Extremities: extremities normal, atraumatic, no cyanosis or edema Pulses: 2+ and symmetric Skin: Skin color, texture, turgor normal. No rashes or lesions Neurologic: Grossly normal  EKG: Normal sinus rhythm with sinus arrhythmia 66-personally reviewed  ASSESSMENT: 1. Dyslipidemia 2. Hypertension 3. Obesity - [facility]  4. History of TBI with headaches and memory loss 5. Strong family history of premature coronary artery disease 6. Mild type 2 diabetes 7. Recent  colon cancer status post resection  PLAN: 1.   Mrs. Anctil has somewhat uncontrolled hypertension.  Her numbers have been much higher at home.  She seems to be under more stress and has gained about 20 pounds over the last year and a half.  Think this is all contributing to her elevated blood pressures.  She is been on lisinopril for a long time.  I like to change that up and switch her over to valsartan 320 mg daily.  I like for her to monitor blood pressures at home and will schedule virtual follow-up in about 1 to 2 months to review that.  She is intermittently had some swelling which is probably worse from her hypertension.  She is also cut back on her sodium.  Cholesterol is at goal.  I encouraged continued weight loss.  She should  continue exercise.  She reports some leg pain which sounds neuropathic in etiology however there is a history of vascular disease in the family.  We will go ahead and get screening ABIs today.  Follow-up with me in 1 month with a virtual visit.  Pixie Casino, MD, [facility], [place] Director of the [facility]  Cardiovascular [facility] Reduction Clinic Diplomate of the American Board of Clinical Lipidology Attending Cardiologist  Direct Dial: [phone]  Fax: [phone]  Website:  [email or web address] 10/04/2019, 8:09 AM

## 2019-10-18 ENCOUNTER — Other Ambulatory Visit: Payer: Self-pay | Admitting: Internal Medicine

## 2019-10-21 ENCOUNTER — Other Ambulatory Visit: Payer: Self-pay

## 2019-10-21 ENCOUNTER — Ambulatory Visit (HOSPITAL_COMMUNITY)
Admission: RE | Admit: 2019-10-21 | Discharge: 2019-10-21 | Disposition: A | Discharge status: Home / Self Care | Payer: BC Managed Care – PPO | Source: Ambulatory Visit | Attending: Cardiology | Admitting: Cardiology

## 2019-10-21 ENCOUNTER — Other Ambulatory Visit: Payer: Self-pay | Admitting: Internal Medicine

## 2019-10-21 DIAGNOSIS — M79606 Pain in leg, unspecified: Secondary | ICD-10-CM | POA: Diagnosis not present

## 2019-10-21 DIAGNOSIS — I708 Atherosclerosis of other arteries: Secondary | ICD-10-CM

## 2019-11-08 ENCOUNTER — Encounter: Payer: Self-pay | Admitting: Family Medicine

## 2019-11-21 ENCOUNTER — Other Ambulatory Visit: Payer: Self-pay

## 2019-11-21 MED ORDER — CARVEDILOL 3.125 MG PO TABS: ORAL_TABLET | ORAL | 10 refills | Status: DC

## 2019-11-25 ENCOUNTER — Other Ambulatory Visit: Payer: Self-pay

## 2019-11-25 MED ORDER — ATORVASTATIN CALCIUM 40 MG PO TABS: 40.0000 mg | ORAL_TABLET | Freq: Every day | ORAL | 3 refills | Status: DC

## 2019-12-02 ENCOUNTER — Encounter: Payer: Self-pay | Admitting: Internal Medicine

## 2019-12-02 ENCOUNTER — Telehealth (INDEPENDENT_AMBULATORY_CARE_PROVIDER_SITE_OTHER): Payer: BC Managed Care – PPO | Admitting: Internal Medicine

## 2019-12-02 VITALS — BP 165/110 | HR 74 | Ht 63.0 in | Wt 211.0 lb

## 2019-12-02 DIAGNOSIS — I10 Essential (primary) hypertension: Secondary | ICD-10-CM

## 2019-12-02 DIAGNOSIS — I708 Atherosclerosis of other arteries: Secondary | ICD-10-CM

## 2019-12-02 DIAGNOSIS — E782 Mixed hyperlipidemia: Secondary | ICD-10-CM

## 2019-12-02 MED ORDER — CARVEDILOL 6.25 MG PO TABS: 6.2500 mg | ORAL_TABLET | Freq: Two times a day (BID) | ORAL | 3 refills | Status: DC

## 2019-12-02 NOTE — Patient Instructions (Signed)
Medication Instructions:  INCREASE carvedilol to 6.25mg  twice daily  *If you need a refill on your cardiac medications before your next appointment, please call your pharmacy*  Follow-Up: At [facility], you and your health needs are our priority.  As part of our continuing mission to provide you with exceptional heart care, we have created designated Provider Care Teams.  These Care Teams include your primary Cardiologist (physician) and Advanced Practice Providers (APPs -  Physician Assistants and Nurse Practitioners) who all work together to provide you with the care you need, when you need it.  Your next appointment:   6 month(s)  The format for your next appointment:   In Person  Provider:   You may see Pixie Casino, MD or one of the following Advanced Practice Providers on your designated Care Team:    [place], [place], [place] or   Roby Lofts, [place]   Other Instructions

## 2019-12-02 NOTE — Progress Notes (Signed)
Virtual Visit via Telephone Note   This visit type was conducted due to national recommendations for restrictions regarding the COVID-19 Pandemic (e.g. social distancing) in an effort to limit this patient's exposure and mitigate transmission in our community.  Due to her co-morbid illnesses, this patient is at least at moderate risk for complications without adequate follow up.  This format is felt to be most appropriate for this patient at this time.  The patient did not have access to video technology/had technical difficulties with video requiring transitioning to audio format only (telephone).  All issues noted in this document were discussed and addressed.  No physical exam could be performed with this format.  Please refer to the patient's chart for her  consent to telehealth for [facility].   Evaluation Performed: Follow-up uncontrolled hypertension  Date:  12/02/2019   ID:  [name], DOB 1951-08-25, MRN [id number]  Patient Location:  [facility] [place] [id number]  Provider location:   [address] [place], [place] [address]  PCP:  Darreld Mclean, MD  Cardiologist:  Pixie Casino, MD Electrophysiologist:  None   Chief Complaint: Uncontrolled hypertension  History of Present Illness:    [name] is a 69 y.o. female who presents via audio/video conferencing for a telehealth visit today.  Mrs. Humphres returns today for follow-up of uncontrolled hypertension.  She reports her blood pressures at home generally been running in [id number] systolic over mid [id number] diastolic.  This is despite change in her medication over to valsartan 320 mg daily.  She is on amlodipine 10 mg and carvedilol 3.125 mg twice daily.  She says that she occasionally misses the evening dose of the carvedilol and will set a reminder for that.  She cannot tolerate thiazides as it worsened her gout in the past, even though they actually can be a treatment  for that.  The patient does not have symptoms concerning for COVID-19 infection (fever, chills, cough, or new SHORTNESS OF BREATH).    Prior CV studies:   The following studies were reviewed today:  Blood pressure  PMHx:  Past Medical History:  Diagnosis Date  . Allergy   . Anemia   . Anxiety   . Arthritis   . Asthma   . Back pain   . Blood transfusion without reported diagnosis   . Cancer of sigmoid colon ([place])   . Constipation   . Depression   . Diabetes mellitus without complication ([place])   . Dyspnea    with exertion  . Fatigue   . Fatty liver    mild  . GERD (gastroesophageal reflux disease)   . Grade I diastolic dysfunction    Echo 08/08/2017 with Grade I DD, EF 51%, Mild Aortic regurgitation, Mild Tricuspid Regurgitation, Pulmonary pressure 30 mm Hg. She denies any chest pain, dyspnea, or peripheral edema.  . Headaches due to old head injury   . History of stomach ulcers   . Hyperlipidemia   . Hypertension   . Lactose intolerance   . Left hip pain   . Left thyroid nodule   . Lipoma of extremity 10/17/2017   Left lower  . Migraines   . Obesity   . TBI (traumatic brain injury) ([place])   . Thyroid disease   . Vitamin D deficiency     Past Surgical History:  Procedure Laterality Date  . ABDOMINAL HYSTERECTOMY    . BREAST LUMPECTOMY WITH RADIOACTIVE SEED LOCALIZATION Left 08/23/2016   Procedure:  LEFT BREAST LUMPECTOMY WITH RADIOACTIVE SEED LOCALIZATION;  Surgeon: Rolm Bookbinder, MD;  Location: [place];  Service: General;  Laterality: Left;  . BREAST SURGERY  2003   reduction  . CESAREAN SECTION    . CHALAZION EXCISION Left 2010  . COLONOSCOPY W/ POLYPECTOMY  02/16/2018   Dr. Ardis Hughs  . FACET JOINT INJECTION  2011 and 2012  . FLEXIBLE SIGMOIDOSCOPY N/A 04/18/2018   Procedure: FLEXIBLE SIGMOIDOSCOPY;  Surgeon: Ileana Roup, MD;  [facility];  Service: General;  Laterality: N/A;  . LAPAROSCOPIC SIGMOID COLECTOMY N/A 04/18/2018    Procedure: LAPAROSCOPIC ASSISTED SIGMOIDECTOMY;  Surgeon: Ileana Roup, MD;  [facility];  Service: General;  Laterality: N/A;  . MYOMECTOMY    . NM MYOCAR PERF WALL MOTION  03/2009   dipyridamole; normal pattern of perfusion in all regions, post-stress EF 65%, normal study   . THYROIDECTOMY  1989   partial  . TRANSTHORACIC ECHOCARDIOGRAM  03/2009   EF=>55%, borderline conc LVH; mild mitral annular calcif, trace MR; trace TR; AV mildly sclerotic, mild AV regurg; mild pulm valve regurg   . TUBAL LIGATION      FAMHx:  Family History  Problem Relation Age of Onset  . Heart disease Mother        MI, HTN  . Hypertension Mother   . Thyroid disease Mother   . Hyperlipidemia Mother   . Sudden death Mother   . Anxiety disorder Mother   . Heart disease Maternal Grandmother        MI, CVA  . Heart disease Maternal Grandfather        MI  . Stroke Maternal Grandfather   . Heart disease Paternal Grandmother   . Stroke Paternal Grandmother   . Heart disease Paternal Grandfather        MI  . Glaucoma Father   . Diabetes Father   . Depression Daughter   . ADD / ADHD Son   . Thyroid disease Sister   . Heart attack Paternal Uncle        HTN  . Hypertension Paternal Uncle   . Colon cancer Neg Hx   . Stomach cancer Neg Hx   . Esophageal cancer Neg Hx   . Rectal cancer Neg Hx     SOCHx:   reports that she has never smoked. She has never used smokeless tobacco. She reports current alcohol use. She reports that she does not use drugs.  ALLERGIES:  Allergies  Allergen Reactions  . Hydrochlorothiazide Other (See Comments)    Increases risk of gout flares   . Botox [Botulinum Toxin Type A] Swelling  . Onabotulinumtoxina Swelling  . Toprol Xl [Metoprolol Succinate] Hives    MEDS:  Current Meds  Medication Sig  . ALPRAZolam (XANAX) 0.25 MG tablet Take 0.25 mg by mouth 2 (two) times daily as needed for anxiety.   Marland Kitchen amLODipine (NORVASC) 10 MG tablet TAKE 1 TABLET BY MOUTH  DAILY  . atorvastatin (LIPITOR) 40 MG tablet Take 1 tablet (40 mg total) by mouth daily.  . carvedilol (COREG) 3.125 MG tablet TAKE 1 TABLET(3.125 MG) BY MOUTH TWICE DAILY  . cetirizine (ZYRTEC) 10 MG tablet Take 10 mg by mouth daily as needed for allergies.  Marland Kitchen desvenlafaxine (PRISTIQ) 100 MG 24 hr tablet Take 100 mg by mouth daily.  . diclofenac (FLECTOR) 1.3 % PTCH Place 1 patch onto the skin 2 (two) times daily as needed (pain).   . Diclofenac Potassium (CAMBIA) 50 MG PACK Take  50 mg by mouth daily as needed (migraines).   . diclofenac sodium (VOLTAREN) 1 % GEL Apply 2 g topically 4 (four) times daily. (Patient taking differently: Apply 2 g topically daily as needed (back pain). )  . fluconazole (DIFLUCAN) 150 MG tablet Take 1 tablet (150 mg total) by mouth every three (3) days as needed.  . Fluocinolone Acetonide Body (DERMA-SMOOTHE/FS BODY) 0.01 % OIL Apply 1 application topically daily as needed. Leave on for 4 hours or overnight, then shampoo scalp  . frovatriptan (FROVA) 2.5 MG tablet Take 2.5 mg by mouth as needed for migraine. If recurs, may repeat after 2 hours. Max of 3 tabs in 24 hours.  . gabapentin (NEURONTIN) 300 MG capsule Take 300 mg by mouth 3 (three) times daily as needed (headaches).   Marland Kitchen ibuprofen (ADVIL,MOTRIN) 200 MG tablet Take 200 mg by mouth 3 (three) times daily as needed for headache or moderate pain.  Marland Kitchen ipratropium (ATROVENT) 0.03 % nasal spray Place 2 sprays into the nose 4 (four) times daily. (Patient taking differently: Place 2 sprays into the nose 4 (four) times daily as needed for rhinitis. )  . naproxen sodium (ALEVE) 220 MG tablet Take 220 mg by mouth daily as needed (pain).  . nitroGLYCERIN (NITROSTAT) 0.4 MG SL tablet Place 1 tablet (0.4 mg total) under the tongue every 5 (five) minutes as needed.  Marland Kitchen OLANZapine (ZYPREXA) 5 MG tablet Take 5 mg by mouth at bedtime. Patient takes 2.5 mg. One tablet daily.  . Olopatadine HCl (PATADAY) 0.2 % SOLN Apply 1 drop daily  as needed to eye (for itching).  Marland Kitchen omeprazole (PRILOSEC) 40 MG capsule Take 40 mg by mouth daily.  . polyethylene glycol (MIRALAX / GLYCOLAX) packet Take 17 g by mouth daily.  . valACYclovir (VALTREX) 500 MG tablet TAKE 1 TABLET BY MOUTH TWICE DAILY. USE FOR 3 DAYS AS NEEDED FOR OUBREAK  . valsartan (DIOVAN) 320 MG tablet Take 1 tablet (320 mg total) by mouth daily.     ROS: Exam not performed due to telephone visit  Labs/Other Tests and Data Reviewed:    Recent Labs: 08/21/2019: ALT 16; BUN 10; Creatinine, Ser 0.60; Hemoglobin 14.2; Platelets 273.0; Potassium 3.7; Sodium 141 09/18/2019: TSH 0.74   Recent Lipid Panel Lab Results  Component Value Date/Time   CHOL 141 08/21/2019 08:54 AM   CHOL 150 12/03/2018 11:33 AM   CHOL 206 (H) 12/19/2013 08:20 AM   TRIG 82.0 08/21/2019 08:54 AM   TRIG 111 12/19/2013 08:20 AM   HDL 50.60 08/21/2019 08:54 AM   HDL 54 12/03/2018 11:33 AM   HDL 70 12/19/2013 08:20 AM   CHOLHDL 3 08/21/2019 08:54 AM   LDLCALC 74 08/21/2019 08:54 AM   LDLCALC 79 12/03/2018 11:33 AM   LDLCALC 114 (H) 12/19/2013 08:20 AM    Wt Readings from Last 3 Encounters:  12/02/19 211 lb (95.7 kg)  10/04/19 220 lb (99.8 kg)  08/21/19 209 lb (94.8 kg)     Exam:    Vital Signs:  BP (!) 165/110   Pulse 74   Ht 5\' 3"  (1.6 m)   Wt 211 lb (95.7 kg)   BMI 37.38 kg/m    Exam not performed due to telephone visit  ASSESSMENT & PLAN:    1. Uncontrolled hypertension  COVID-19 Education: The signs and symptoms of COVID-19 were discussed with the patient and how to seek care for testing (follow up with PCP or arrange E-visit).  The importance of social distancing was discussed today.  Patient Risk:   After full review of this patients clinical status, I feel that they are at least moderate risk at this time.  Time:   Today, I have spent 15 minutes with the patient with telehealth technology discussing uncontrolled hypertension, medication compliance.     Medication  Adjustments/Labs and Tests Ordered: Current medicines are reviewed at length with the patient today.  Concerns regarding medicines are outlined above.   Tests Ordered: No orders of the defined types were placed in this encounter.   Medication Changes: No orders of the defined types were placed in this encounter.   Disposition:  in 6 month(s)  Pixie Casino, MD, [facility] [place] Director of the Advanced Lipid Disorders &  Cardiovascular Risk Reduction Clinic Diplomate of the American Board of Clinical Lipidology Attending Cardiologist  Direct Dial: [phone]  Fax: [phone]  Website:  [email or web address]  Pixie Casino, MD  12/02/2019 8:33 AM

## 2019-12-20 DIAGNOSIS — M542 Cervicalgia: Secondary | ICD-10-CM | POA: Insufficient documentation

## 2020-01-02 ENCOUNTER — Other Ambulatory Visit: Payer: Self-pay | Admitting: Family Medicine

## 2020-03-01 ENCOUNTER — Encounter: Payer: Self-pay | Admitting: Family Medicine

## 2020-03-01 DIAGNOSIS — B009 Herpesviral infection, unspecified: Secondary | ICD-10-CM

## 2020-03-01 MED ORDER — VALACYCLOVIR HCL 1 G PO TABS: 1000.0000 mg | ORAL_TABLET | Freq: Every day | ORAL | 2 refills | Status: DC

## 2020-03-23 NOTE — Progress Notes (Addendum)
[place] at [facility] [address] [place], [place] [address] [phone]  Date:  03/25/2020   Name:  [name]   DOB:  May 16, 1951   MRN:  [id number]  PCP:  Darreld Mclean, MD    Chief Complaint: Abdominal Redness   History of Present Illness:  [name] is a 69 y.o. 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  She was under some stress- they are back in regular school now which is helpful for her  COVID-19 vaccine; done!  Shingrix-discussed with patient today Colonoscopy up-to-date Mammogram up-to-date Offer DEXA scan; will order for her  Complete labs done in September, mild vitamin D deficiency was present A1c 5.9 at that time  Today she notes an issue with irritation under her pannus. She tries to exercise, but sweating and irritation worsen when she is more active.  She will often develop redness, itching, and pain Her symptoms are likely worsened by multiple abdominal operations, including C-section, hysterectomy, and partial colectomy She would like to see plastic surgery for a consultation for panniculectomy  We do not have the exact dates handy, but patient recalls me treating her with nystatin powder for Candida intertrigo under her pannus in the past  Patient Active Problem List   Diagnosis Date Noted  . Colon cancer ([place]) 04/18/2018  . Class 2 obesity with serious comorbidity and body mass index (BMI) of 38.0 to 38.9 in adult 07/27/2017  . Vitamin D deficiency 07/27/2017  . Other fatigue 06/29/2017  . Shortness of breath on exertion  06/29/2017  . Class 2 obesity with serious comorbidity and body mass index (BMI) of 39.0 to 39.9 in adult 06/29/2017  . Left hip pain 04/18/2017  . Pre-diabetes 02/05/2016  . Left thyroid nodule 11/10/2014  . TBI (traumatic brain injury) ([place]) 07/15/2014  . Chest pain 12/13/2013  . Dyslipidemia 12/13/2013  . Obesity (BMI 35.0-39.9 without comorbidity) 12/13/2013  . Hypertension 01/18/2012  . GERD (gastroesophageal reflux disease) 01/18/2012  . Depression 01/18/2012  . Migraine syndrome 01/18/2012    Past Medical History:  Diagnosis Date  . Allergy   . Anemia   . Anxiety   . Arthritis   . Asthma   . Back pain   . Blood transfusion without reported diagnosis   . Cancer of sigmoid colon ([place])   . Constipation   . Depression   . Diabetes mellitus without complication ([place])   . Dyspnea    with exertion  . Fatigue   . Fatty liver    mild  . GERD (gastroesophageal reflux disease)   . Grade I diastolic dysfunction    Echo 08/08/2017 with Grade I DD, EF 51%, Mild Aortic regurgitation, Mild Tricuspid Regurgitation, Pulmonary pressure 30 mm Hg. She denies any chest pain, dyspnea, or peripheral edema.  . Headaches due to old head injury   . History of stomach ulcers   . Hyperlipidemia   . Hypertension   . Lactose intolerance   . Left hip pain   . Left thyroid nodule   . Lipoma of extremity 10/17/2017   Left lower  . Migraines   . Obesity   . TBI (  traumatic brain injury) ([place])   . Thyroid disease   . Vitamin D deficiency     Past Surgical History:  Procedure Laterality Date  . ABDOMINAL HYSTERECTOMY    . BREAST LUMPECTOMY WITH RADIOACTIVE SEED LOCALIZATION Left 08/23/2016   Procedure: LEFT BREAST LUMPECTOMY WITH RADIOACTIVE SEED LOCALIZATION;  Surgeon: Rolm Bookbinder, MD;  Location: [facility];  Service: General;  Laterality: Left;  . BREAST SURGERY  2003   reduction  . CESAREAN SECTION    . CHALAZION EXCISION Left 2010  . COLONOSCOPY W/ POLYPECTOMY   02/16/2018   Dr. Ardis Hughs  . FACET JOINT INJECTION  2011 and 2012  . FLEXIBLE SIGMOIDOSCOPY N/A 04/18/2018   Procedure: FLEXIBLE SIGMOIDOSCOPY;  Surgeon: Ileana Roup, MD;  [facility];  Service: General;  Laterality: N/A;  . LAPAROSCOPIC SIGMOID COLECTOMY N/A 04/18/2018   Procedure: LAPAROSCOPIC ASSISTED SIGMOIDECTOMY;  Surgeon: Ileana Roup, MD;  [facility];  Service: General;  Laterality: N/A;  . MYOMECTOMY    . NM MYOCAR PERF WALL MOTION  03/2009   dipyridamole; normal pattern of perfusion in all regions, post-stress EF 65%, normal study   . THYROIDECTOMY  1989   partial  . TRANSTHORACIC ECHOCARDIOGRAM  03/2009   EF=>55%, borderline conc LVH; mild mitral annular calcif, trace MR; trace TR; AV mildly sclerotic, mild AV regurg; mild pulm valve regurg   . TUBAL LIGATION      Social History   Tobacco Use  . Smoking status: Never Smoker  . Smokeless tobacco: Never Used  Substance Use Topics  . Alcohol use: Yes    Comment: occ. beer  . Drug use: No    Family History  Problem Relation Age of Onset  . Heart disease Mother        MI, HTN  . Hypertension Mother   . Thyroid disease Mother   . Hyperlipidemia Mother   . Sudden death Mother   . Anxiety disorder Mother   . Heart disease Maternal Grandmother        MI, CVA  . Heart disease Maternal Grandfather        MI  . Stroke Maternal Grandfather   . Heart disease Paternal Grandmother   . Stroke Paternal Grandmother   . Heart disease Paternal Grandfather        MI  . Glaucoma Father   . Diabetes Father   . Depression Daughter   . ADD / ADHD Son   . Thyroid disease Sister   . Heart attack Paternal Uncle        HTN  . Hypertension Paternal Uncle   . Colon cancer Neg Hx   . Stomach cancer Neg Hx   . Esophageal cancer Neg Hx   . Rectal cancer Neg Hx     Allergies  Allergen Reactions  . Hydrochlorothiazide Other (See Comments)    Increases risk of gout flares   . Botox [Botulinum Toxin Type A]  Swelling  . Onabotulinumtoxina Swelling  . Toprol Xl [Metoprolol Succinate] Hives    Medication list has been reviewed and updated.  Current Outpatient Medications on File Prior to Visit  Medication Sig Dispense Refill  . amLODipine (NORVASC) 10 MG tablet TAKE 1 TABLET BY MOUTH DAILY 30 tablet 6  . carvedilol (COREG) 6.25 MG tablet Take 1 tablet (6.25 mg total) by mouth 2 (two) times daily with a meal. TAKE 1 TABLET(3.125 MG) BY MOUTH TWICE DAILY 180 tablet 3  . cetirizine (ZYRTEC) 10 MG tablet Take 10 mg by  mouth daily as needed for allergies.    Marland Kitchen desvenlafaxine (PRISTIQ) 100 MG 24 hr tablet Take 100 mg by mouth daily.    . diclofenac (FLECTOR) 1.3 % PTCH Place 1 patch onto the skin 2 (two) times daily as needed (pain).     . Diclofenac Potassium (CAMBIA) 50 MG PACK Take 50 mg by mouth daily as needed (migraines).     . diclofenac sodium (VOLTAREN) 1 % GEL Apply 2 g topically 4 (four) times daily. (Patient taking differently: Apply 2 g topically daily as needed (back pain). ) 100 g 3  . fluconazole (DIFLUCAN) 150 MG tablet Take 1 tablet (150 mg total) by mouth every three (3) days as needed. 3 tablet 0  . Fluocinolone Acetonide Body (DERMA-SMOOTHE/FS BODY) 0.01 % OIL Apply 1 application topically daily as needed. Leave on for 4 hours or overnight, then shampoo scalp 118.28 mL 2  . frovatriptan (FROVA) 2.5 MG tablet Take 2.5 mg by mouth as needed for migraine. If recurs, may repeat after 2 hours. Max of 3 tabs in 24 hours.    . gabapentin (NEURONTIN) 300 MG capsule Take 300 mg by mouth 3 (three) times daily as needed (headaches).     Marland Kitchen ibuprofen (ADVIL,MOTRIN) 200 MG tablet Take 200 mg by mouth 3 (three) times daily as needed for headache or moderate pain.    Marland Kitchen ipratropium (ATROVENT) 0.03 % nasal spray Place 2 sprays into the nose 4 (four) times daily. (Patient taking differently: Place 2 sprays into the nose 4 (four) times daily as needed for rhinitis. ) 30 mL 1  . naproxen sodium (ALEVE) 220  MG tablet Take 220 mg by mouth daily as needed (pain).    . nitroGLYCERIN (NITROSTAT) 0.4 MG SL tablet Place 1 tablet (0.4 mg total) under the tongue every 5 (five) minutes as needed. 30 tablet 0  . OLANZapine (ZYPREXA) 5 MG tablet Take 5 mg by mouth at bedtime. Patient takes 2.5 mg. One tablet daily.    . Olopatadine HCl (PATADAY) 0.2 % SOLN Apply 1 drop daily as needed to eye (for itching). 2.5 mL 9  . omeprazole (PRILOSEC) 40 MG capsule Take 40 mg by mouth daily.    . polyethylene glycol (MIRALAX / GLYCOLAX) packet Take 17 g by mouth daily.    . valACYclovir (VALTREX) 1000 MG tablet Take 1 tablet (1,000 mg total) by mouth daily. For suppression 90 tablet 2  . valACYclovir (VALTREX) 500 MG tablet TAKE 1 TABLET BY MOUTH TWICE DAILY FOR 3 DAYS AS NEEDED FOR OUTBREAK 30 tablet 1  . valsartan (DIOVAN) 320 MG tablet Take 1 tablet (320 mg total) by mouth daily. 90 tablet 3  . atorvastatin (LIPITOR) 40 MG tablet Take 1 tablet (40 mg total) by mouth daily. 90 tablet 3   No current facility-administered medications on file prior to visit.    Review of Systems:  As per HPI- otherwise negative.   Physical Examination: Vitals:   03/25/20 1051  BP: 140/88  Pulse: 84  Resp: 18  Temp: 98.1 F (36.7 C)  SpO2: 98%   Vitals:   03/25/20 1051  Weight: 215 lb (97.5 kg)  Height: 5\' 3"  (1.6 m)   Body mass index is 38.09 kg/m. Ideal Body Weight: Weight in (lb) to have BMI = 25: 140.8  GEN: no acute distress.  Obese, otherwise looks well HEENT: Atraumatic, Normocephalic.  Ears and Nose: No external deformity. CV: RRR, No M/G/R. No JVD. No thrill. No extra heart sounds. PULM: CTA B,  no wheezes, crackles, rhonchi. No retractions. No resp. distress. No accessory muscle use. ABD: S, NT, ND, +BS. No rebound. No HSM. EXTR: No c/c/e PSYCH: Normally interactive. Conversant.  Her abdomen displays multiple well-healed surgical scars When she is standing, her pannus does extend to at least the level of  the pubic symphysis.  She has some mild irritation and one small healing ulcer under the pannus   Assessment and Plan: Pannus, abdominal - Plan: Ambulatory referral to Plastic Surgery  Malignant neoplasm of colon, unspecified part of colon ([place])  Thyroid enlargement - Plan: TSH  Essential hypertension - Plan: Basic metabolic panel  Vitamin D deficiency - Plan: Vitamin D (25 hydroxy)  Screening for diabetes mellitus - Plan: Hemoglobin A1c  Estrogen deficiency - Plan: DG Bone Density  Candidal intertrigo  Here today for follow-up visit  I do think a panniculectomy would be of benefit to patient, it would help prevent infection and increase her ability to exercise.  Referral to plastic surgery to discuss this in further detail  Colon cancer screening is up-to-date Ordered needed labs as above Will plan further follow- up pending labs.  This visit occurred during the SARS-CoV-2 public health emergency.  Safety protocols were in place, including screening questions prior to the visit, additional usage of staff PPE, and extensive cleaning of exam room while observing appropriate contact time as indicated for disinfecting solutions.   Moderate medical decision making today Signed Lamar Blinks, MD  Received her labs as below, message to patient  Prediabetes is stable Results for orders placed or performed in visit on 03/25/20  Hemoglobin A1c  Result Value Ref Range   Hgb A1c MFr Bld 5.7 4.6 - 6.5 %  TSH  Result Value Ref Range   TSH 0.50 0.35 - 4.50 uIU/mL  Basic metabolic panel  Result Value Ref Range   Sodium 141 135 - 145 mEq/L   Potassium 3.5 3.5 - 5.1 mEq/L   Chloride 104 96 - 112 mEq/L   CO2 33 (H) 19 - 32 mEq/L   Glucose, Bld 104 (H) 70 - 99 mg/dL   BUN 13 6 - 23 mg/dL   Creatinine, Ser 0.78 0.40 - 1.20 mg/dL   GFR 89.02 >60.00 mL/min   Calcium 9.1 8.4 - 10.5 mg/dL  Vitamin D (25 hydroxy)  Result Value Ref Range   VITD 25.43 (L) 30.00 - 100.00 ng/mL

## 2020-03-23 NOTE — Patient Instructions (Addendum)
It was great to see you again today- I will be in touch with your labs We will set you up to see Dr Marla Roe regarding possible tummy tuck operation  I ordered a bone density scan for you to be done at [facility]  Please consider getting the shingrix vaccine at your drug store if not done already

## 2020-03-25 ENCOUNTER — Ambulatory Visit: Payer: BC Managed Care – PPO | Admitting: Family Medicine

## 2020-03-25 ENCOUNTER — Encounter: Payer: Self-pay | Admitting: Family Medicine

## 2020-03-25 ENCOUNTER — Other Ambulatory Visit: Payer: Self-pay

## 2020-03-25 VITALS — BP 140/88 | HR 84 | Temp 98.1°F | Resp 18 | Ht 63.0 in | Wt 215.0 lb

## 2020-03-25 DIAGNOSIS — C189 Malignant neoplasm of colon, unspecified: Secondary | ICD-10-CM | POA: Diagnosis not present

## 2020-03-25 DIAGNOSIS — E559 Vitamin D deficiency, unspecified: Secondary | ICD-10-CM | POA: Diagnosis not present

## 2020-03-25 DIAGNOSIS — E2839 Other primary ovarian failure: Secondary | ICD-10-CM

## 2020-03-25 DIAGNOSIS — E049 Nontoxic goiter, unspecified: Secondary | ICD-10-CM

## 2020-03-25 DIAGNOSIS — Z131 Encounter for screening for diabetes mellitus: Secondary | ICD-10-CM | POA: Diagnosis not present

## 2020-03-25 DIAGNOSIS — I10 Essential (primary) hypertension: Secondary | ICD-10-CM

## 2020-03-25 DIAGNOSIS — E65 Localized adiposity: Secondary | ICD-10-CM | POA: Diagnosis not present

## 2020-03-25 DIAGNOSIS — B372 Candidiasis of skin and nail: Secondary | ICD-10-CM

## 2020-03-25 LAB — BASIC METABOLIC PANEL
BUN: 13 mg/dL (ref 6–23)
CO2: 33 mEq/L — ABNORMAL HIGH (ref 19–32)
Calcium: 9.1 mg/dL (ref 8.4–10.5)
Chloride: 104 mEq/L (ref 96–112)
Creatinine, Ser: 0.78 mg/dL (ref 0.40–1.20)
GFR: 89.02 mL/min (ref >60.00)
Glucose, Bld: 104 mg/dL — ABNORMAL HIGH (ref 70–99)
Potassium: 3.5 mEq/L (ref 3.5–5.1)
Sodium: 141 mEq/L (ref 135–145)

## 2020-03-25 LAB — VITAMIN D 25 HYDROXY (VIT D DEFICIENCY, FRACTURES): VITD: 25.43 ng/mL — ABNORMAL LOW (ref 30.00–100.00)

## 2020-03-25 LAB — TSH: TSH: 0.5 u[IU]/mL (ref 0.35–4.50)

## 2020-03-25 LAB — HEMOGLOBIN A1C: Hgb A1c MFr Bld: 5.7 % (ref 4.6–6.5)

## 2020-04-06 ENCOUNTER — Emergency Department (HOSPITAL_BASED_OUTPATIENT_CLINIC_OR_DEPARTMENT_OTHER)
Admission: EM | Admit: 2020-04-06 | Discharge: 2020-04-06 | Disposition: A | Discharge status: Home / Self Care | Payer: BC Managed Care – PPO | Attending: Emergency Medicine | Admitting: Emergency Medicine

## 2020-04-06 ENCOUNTER — Other Ambulatory Visit: Payer: Self-pay

## 2020-04-06 ENCOUNTER — Encounter (HOSPITAL_BASED_OUTPATIENT_CLINIC_OR_DEPARTMENT_OTHER): Payer: Self-pay | Admitting: *Deleted

## 2020-04-06 ENCOUNTER — Emergency Department (HOSPITAL_BASED_OUTPATIENT_CLINIC_OR_DEPARTMENT_OTHER): Payer: BC Managed Care – PPO

## 2020-04-06 DIAGNOSIS — I10 Essential (primary) hypertension: Secondary | ICD-10-CM | POA: Insufficient documentation

## 2020-04-06 DIAGNOSIS — Z79899 Other long term (current) drug therapy: Secondary | ICD-10-CM | POA: Insufficient documentation

## 2020-04-06 DIAGNOSIS — E119 Type 2 diabetes mellitus without complications: Secondary | ICD-10-CM | POA: Insufficient documentation

## 2020-04-06 DIAGNOSIS — R05 Cough: Secondary | ICD-10-CM | POA: Diagnosis not present

## 2020-04-06 DIAGNOSIS — J45909 Unspecified asthma, uncomplicated: Secondary | ICD-10-CM | POA: Insufficient documentation

## 2020-04-06 DIAGNOSIS — R0981 Nasal congestion: Secondary | ICD-10-CM | POA: Insufficient documentation

## 2020-04-06 DIAGNOSIS — R0602 Shortness of breath: Secondary | ICD-10-CM | POA: Insufficient documentation

## 2020-04-06 DIAGNOSIS — R059 Cough, unspecified: Secondary | ICD-10-CM

## 2020-04-06 MED ORDER — DEXAMETHASONE 6 MG PO TABS
10.0000 mg | ORAL_TABLET | Freq: Once | ORAL | Status: AC
  Administered 2020-04-06: 10 mg via ORAL
  Filled 2020-04-06: qty 1

## 2020-04-06 MED ORDER — SALINE SPRAY 0.65 % NA SOLN: 1.0000 | NASAL | 0 refills | Status: DC | PRN

## 2020-04-06 MED ORDER — FLUTICASONE PROPIONATE 50 MCG/ACT NA SUSP: 2.0000 | Freq: Every day | NASAL | 0 refills | Status: DC

## 2020-04-06 NOTE — ED Provider Notes (Signed)
I received this patient in signout from Dr. Dina Rich.  Patient had presented with nasal congestion, postnasal drip, and cough.  At time of signout, awaiting chest x-ray with likely discharge after results complete.  Chest x-ray clear.  Patient comfortable on reassessment with reassuring vital signs and normal O2 saturation on room air.  I discussed x-ray results and supportive meds including Flonase.  Instructed to follow-up with PCP in a few days if no improvement.  She voiced understanding.   Lezley Bedgood, Wenda Overland, MD 04/06/20 [phone]

## 2020-04-06 NOTE — ED Provider Notes (Signed)
[facility] Provider Note   CSN: [id number] Arrival date & time: 04/06/20  [id number]     History Chief Complaint  Patient presents with  . cough and congestion    [name] is a 69 y.o. female.  HPI     This is a 69 year old female with a history of hypertension, hyperlipidemia, asthma, diabetes who presents with 1 week history of cough nasal congestion postnasal drip.  Patient reports that she felt like she was draining into the back of her throat.  Since that time it has "settled in my chest."  She states that she has been taking Mucinex for the last 3 days which "broke everything up."  However, she has developed some back pain occurred after coughing and is worse with coughing.  She also states that she has developed shortness of breath.  No chest pain.  She has had to use an inhaler before but denies history of smoking or COPD.  She has not had any fevers.  She does have some production to her cough.  She is fully vaccinated against COVID-19.  He does report that her granddaughter was sick 1 week ago with similar upper respiratory symptoms.  She has a history of allergies so has been taking Zyrtec the last 2 days with no improvement.  Past Medical History:  Diagnosis Date  . Allergy   . Anemia   . Anxiety   . Arthritis   . Asthma   . Back pain   . Blood transfusion without reported diagnosis   . Cancer of sigmoid colon ([place])   . Constipation   . Depression   . Diabetes mellitus without complication ([place])   . Dyspnea    with exertion  . Fatigue   . Fatty liver    mild  . GERD (gastroesophageal reflux disease)   . Grade I diastolic dysfunction    Echo 08/08/2017 with Grade I DD, EF 51%, Mild Aortic regurgitation, Mild Tricuspid Regurgitation, Pulmonary pressure 30 mm Hg. She denies any chest pain, dyspnea, or peripheral edema.  . Headaches due to old head injury   . History of stomach ulcers   . Hyperlipidemia   . Hypertension     . Lactose intolerance   . Left hip pain   . Left thyroid nodule   . Lipoma of extremity 10/17/2017   Left lower  . Migraines   . Obesity   . TBI (traumatic brain injury) ([place])   . Thyroid disease   . Vitamin D deficiency     Patient Active Problem List   Diagnosis Date Noted  . Colon cancer ([place]) 04/18/2018  . Class 2 obesity with serious comorbidity and body mass index (BMI) of 38.0 to 38.9 in adult 07/27/2017  . Vitamin D deficiency 07/27/2017  . Other fatigue 06/29/2017  . Shortness of breath on exertion 06/29/2017  . Class 2 obesity with serious comorbidity and body mass index (BMI) of 39.0 to 39.9 in adult 06/29/2017  . Left hip pain 04/18/2017  . Pre-diabetes 02/05/2016  . Left thyroid nodule 11/10/2014  . TBI (traumatic brain injury) ([place]) 07/15/2014  . Chest pain 12/13/2013  . Dyslipidemia 12/13/2013  . Obesity (BMI 35.0-39.9 without comorbidity) 12/13/2013  . Hypertension 01/18/2012  . GERD (gastroesophageal reflux disease) 01/18/2012  . Depression 01/18/2012  . Migraine syndrome 01/18/2012    Past Surgical History:  Procedure Laterality Date  . ABDOMINAL HYSTERECTOMY    . BREAST LUMPECTOMY WITH RADIOACTIVE SEED LOCALIZATION Left 08/23/2016  Procedure: LEFT BREAST LUMPECTOMY WITH RADIOACTIVE SEED LOCALIZATION;  Surgeon: Rolm Bookbinder, MD;  Location: [facility];  Service: General;  Laterality: Left;  . BREAST SURGERY  2003   reduction  . CESAREAN SECTION    . CHALAZION EXCISION Left 2010  . COLONOSCOPY W/ POLYPECTOMY  02/16/2018   Dr. Ardis Hughs  . FACET JOINT INJECTION  2011 and 2012  . FLEXIBLE SIGMOIDOSCOPY N/A 04/18/2018   Procedure: FLEXIBLE SIGMOIDOSCOPY;  Surgeon: Ileana Roup, MD;  Location: [facility];  Service: General;  Laterality: N/A;  . LAPAROSCOPIC SIGMOID COLECTOMY N/A 04/18/2018   Procedure: LAPAROSCOPIC ASSISTED SIGMOIDECTOMY;  Surgeon: Ileana Roup, MD;  Location: [facility];  Service: General;  Laterality: N/A;  .  MYOMECTOMY    . NM MYOCAR PERF WALL MOTION  03/2009   dipyridamole; normal pattern of perfusion in all regions, post-stress EF 65%, normal study   . THYROIDECTOMY  1989   partial  . TRANSTHORACIC ECHOCARDIOGRAM  03/2009   EF=>55%, borderline conc LVH; mild mitral annular calcif, trace MR; trace TR; AV mildly sclerotic, mild AV regurg; mild pulm valve regurg   . TUBAL LIGATION       OB History    Gravida  2   Para  2   Term  2   Preterm      AB      Living  2     SAB      TAB      Ectopic      Multiple      Live Births              Family History  Problem Relation Age of Onset  . Heart disease Mother        MI, HTN  . Hypertension Mother   . Thyroid disease Mother   . Hyperlipidemia Mother   . Sudden death Mother   . Anxiety disorder Mother   . Heart disease Maternal Grandmother        MI, CVA  . Heart disease Maternal Grandfather        MI  . Stroke Maternal Grandfather   . Heart disease Paternal Grandmother   . Stroke Paternal Grandmother   . Heart disease Paternal Grandfather        MI  . Glaucoma Father   . Diabetes Father   . Depression Daughter   . ADD / ADHD Son   . Thyroid disease Sister   . Heart attack Paternal Uncle        HTN  . Hypertension Paternal Uncle   . Colon cancer Neg Hx   . Stomach cancer Neg Hx   . Esophageal cancer Neg Hx   . Rectal cancer Neg Hx     Social History   Tobacco Use  . Smoking status: Never Smoker  . Smokeless tobacco: Never Used  Substance Use Topics  . Alcohol use: Yes    Comment: occ. beer  . Drug use: No    Home Medications Prior to Admission medications   Medication Sig Start Date End Date Taking? Authorizing Provider  amLODipine (NORVASC) 10 MG tablet TAKE 1 TABLET BY MOUTH DAILY 12/10/18   Hilty, Nadean Corwin, MD  atorvastatin (LIPITOR) 40 MG tablet Take 1 tablet (40 mg total) by mouth daily. 11/25/19 02/23/20  Hilty, Nadean Corwin, MD  carvedilol (COREG) 6.25 MG tablet Take 1 tablet (6.25 mg total)  by mouth 2 (two) times daily with a meal. TAKE 1 TABLET(3.125 MG) BY MOUTH TWICE DAILY  12/02/19   Hilty, Nadean Corwin, MD  cetirizine (ZYRTEC) 10 MG tablet Take 10 mg by mouth daily as needed for allergies.    [provider]  desvenlafaxine (PRISTIQ) 100 MG 24 hr tablet Take 100 mg by mouth daily.    [provider]  diclofenac (FLECTOR) 1.3 % PTCH Place 1 patch onto the skin 2 (two) times daily as needed (pain).     [provider]  Diclofenac Potassium (CAMBIA) 50 MG PACK Take 50 mg by mouth daily as needed (migraines).     [provider]  diclofenac sodium (VOLTAREN) 1 % GEL Apply 2 g topically 4 (four) times daily. Patient taking differently: Apply 2 g topically daily as needed (back pain).  02/05/16   Copland, Gay Filler, MD  fluconazole (DIFLUCAN) 150 MG tablet Take 1 tablet (150 mg total) by mouth every three (3) days as needed. 05/26/19   Sharion Balloon, FNP  Fluocinolone Acetonide Body (DERMA-SMOOTHE/FS BODY) 0.01 % OIL Apply 1 application topically daily as needed. Leave on for 4 hours or overnight, then shampoo scalp 08/21/19   Copland, Gay Filler, MD  frovatriptan (FROVA) 2.5 MG tablet Take 2.5 mg by mouth as needed for migraine. If recurs, may repeat after 2 hours. Max of 3 tabs in 24 hours.    [provider]  gabapentin (NEURONTIN) 300 MG capsule Take 300 mg by mouth 3 (three) times daily as needed (headaches).     [provider]  ibuprofen (ADVIL,MOTRIN) 200 MG tablet Take 200 mg by mouth 3 (three) times daily as needed for headache or moderate pain.    [provider]  ipratropium (ATROVENT) 0.03 % nasal spray Place 2 sprays into the nose 4 (four) times daily. Patient taking differently: Place 2 sprays into the nose 4 (four) times daily as needed for rhinitis.  10/08/13   Shawnee Knapp, MD  naproxen sodium (ALEVE) 220 MG tablet Take 220 mg by mouth daily as needed (pain).    [provider]  nitroGLYCERIN (NITROSTAT) 0.4  MG SL tablet Place 1 tablet (0.4 mg total) under the tongue every 5 (five) minutes as needed. 10/08/13   Shawnee Knapp, MD  OLANZapine (ZYPREXA) 5 MG tablet Take 5 mg by mouth at bedtime. Patient takes 2.5 mg. One tablet daily.    [provider]  Olopatadine HCl (PATADAY) 0.2 % SOLN Apply 1 drop daily as needed to eye (for itching). 10/12/17   Copland, Gay Filler, MD  omeprazole (PRILOSEC) 40 MG capsule Take 40 mg by mouth daily.    [provider]  polyethylene glycol (MIRALAX / GLYCOLAX) packet Take 17 g by mouth daily.    [provider]  valACYclovir (VALTREX) 1000 MG tablet Take 1 tablet (1,000 mg total) by mouth daily. For suppression 03/01/20   Copland, Gay Filler, MD  valACYclovir (VALTREX) 500 MG tablet TAKE 1 TABLET BY MOUTH TWICE DAILY FOR 3 DAYS AS NEEDED FOR OUTBREAK 01/02/20   Copland, Gay Filler, MD  valsartan (DIOVAN) 320 MG tablet Take 1 tablet (320 mg total) by mouth daily. 10/04/19   Hilty, Nadean Corwin, MD    Allergies    Hydrochlorothiazide, Botox [botulinum toxin type a], Onabotulinumtoxina, and Toprol xl [metoprolol succinate]  Review of Systems   Review of Systems  Constitutional: Negative for fever.  HENT: Positive for congestion and postnasal drip. Negative for sinus pressure, sneezing and sore throat.   Respiratory: Positive for cough and shortness of breath.   Cardiovascular: Negative for chest  pain.  Gastrointestinal: Negative for abdominal pain, diarrhea, nausea and vomiting.  Musculoskeletal: Positive for back pain.  All other systems reviewed and are negative.   Physical Exam Updated Vital Signs BP (!) 168/80 (BP Location: Right Arm)   Pulse 63   Temp 98.6 F (37 C)   Resp 18   Ht 1.6 m (5\' 3" )   Wt 96.6 kg   SpO2 98%   BMI 37.73 kg/m   Physical Exam Vitals and nursing note reviewed.  Constitutional:      Appearance: She is well-developed.  HENT:     Head: Normocephalic and atraumatic.     Nose: Congestion present.      Mouth/Throat:     Mouth: Mucous membranes are moist.     Comments: Postnasal drip noted Eyes:     Pupils: Pupils are equal, round, and reactive to light.  Cardiovascular:     Rate and Rhythm: Normal rate and regular rhythm.     Heart sounds: Normal heart sounds.  Pulmonary:     Effort: Pulmonary effort is normal. No respiratory distress.     Breath sounds: No wheezing.     Comments: Tenderness to palpation just medial to the right scapula, no crepitus or overlying skin changes Abdominal:     General: Bowel sounds are normal.     Palpations: Abdomen is soft.     Tenderness: There is no abdominal tenderness.  Musculoskeletal:     Cervical back: Neck supple.     Right lower leg: No edema.     Left lower leg: No edema.  Skin:    General: Skin is warm and dry.  Neurological:     Mental Status: She is alert and oriented to person, place, and time.  Psychiatric:        Mood and Affect: Mood normal.     ED Results / Procedures / Treatments   Labs (all labs ordered are listed, but only abnormal results are displayed) Labs Reviewed - No data to display  EKG None  Radiology No results found.  Procedures Procedures (including critical care time)  Medications Ordered in ED Medications - No data to display  ED Course  I have reviewed the triage vital signs and the nursing notes.  Pertinent labs & imaging results that were available during my care of the patient were reviewed by me and considered in my medical decision making (see chart for details).    MDM Rules/Calculators/A&P                      Patient presents with upper respiratory symptoms including congestion, postnasal drip, cough and now has shortness of breath and back pain.  She is overall nontoxic and vital signs notable for blood pressure 168/80.  She is nontoxic on exam.  O2 sats 98%.  She is not wheezing and her pulmonary exam is unremarkable.  She is congested appearing on exam and evidence of postnasal drip  in the posterior oropharynx.  She has some reproducible back pain on exam but no crepitus.  Will obtain screening EKG and chest x-ray to rule out pneumothorax/pneumonia.  Suspect she may have pulled a muscle from coughing.    Work-up pending.  If negative, feel patient can be discharged home with Flonase, nasal saline, continued Zyrtec.   Final Clinical Impression(s) / ED Diagnoses Final diagnoses:  None    Rx / DC Orders ED Discharge Orders    None       Dezman Granda, Loma Sousa  F, MD 04/06/20 [phone]

## 2020-04-06 NOTE — ED Triage Notes (Addendum)
Pt c/o dry cough and congestion times one week. Concerned she has a sinus infection. States her chest feels congested. Denies feeling sob,but states pain with inspiration. 02 sats 98%. OTC meds of mucinex and zyrtec have not been helping.

## 2020-04-06 NOTE — ED Notes (Signed)
MD with pt  

## 2020-04-17 ENCOUNTER — Telehealth: Payer: Self-pay | Admitting: Internal Medicine

## 2020-04-17 NOTE — Telephone Encounter (Signed)
Patient contacted 04/17/20 to be scheduled for follow up visit, no answer, left message

## 2020-06-05 ENCOUNTER — Telehealth: Payer: Self-pay | Admitting: Internal Medicine

## 2020-06-05 ENCOUNTER — Other Ambulatory Visit: Payer: Self-pay

## 2020-06-05 ENCOUNTER — Encounter: Payer: Self-pay | Admitting: Plastic Surgery

## 2020-06-05 ENCOUNTER — Ambulatory Visit: Payer: BC Managed Care – PPO | Admitting: Plastic Surgery

## 2020-06-05 VITALS — BP 154/74 | HR 69 | Temp 98.2°F | Ht 63.0 in | Wt 216.4 lb

## 2020-06-05 DIAGNOSIS — E785 Hyperlipidemia, unspecified: Secondary | ICD-10-CM

## 2020-06-05 DIAGNOSIS — M25552 Pain in left hip: Secondary | ICD-10-CM

## 2020-06-05 MED ORDER — AMLODIPINE BESYLATE 10 MG PO TABS: 10.0000 mg | ORAL_TABLET | Freq: Every day | ORAL | 5 refills | Status: DC

## 2020-06-05 NOTE — Telephone Encounter (Signed)
[name] is calling requesting a nurse call her to discuss which medications she should be taking due to being confused. Please advise.

## 2020-06-05 NOTE — Progress Notes (Signed)
Patient ID: [name], female    DOB: 1951-05-04, 69 y.o.   MRN: [id number]   Chief Complaint  Patient presents with   Consult   Skin Problem    The patient is a 69 year old female here for evaluation of her abdomen.  She is 5 feet 3 inches tall and weighs 216 pounds.  She has had colon surgery for cancer, C-sections and a hysterectomy.  She had a fall at work about 12 years ago.  Since then she has had to get injections in her back on a fairly regular basis to help with the pain.  She is not a smoker and does not have diabetes.  She complains of chronic sores, draining and wounds under her pannus.  She has tried all kinds of creams as well as oral medications given to her by her PCP.  Warmer weather makes it worse.  She has a very thick heavy pannus.  She has some skin irritation in the folds.  I do not see any infection today.   Review of Systems  Constitutional: Negative for activity change and appetite change.  HENT: Negative.   Eyes: Negative.   Respiratory: Negative for chest tightness and shortness of breath.   Cardiovascular: Negative for leg swelling.  Gastrointestinal: Negative for abdominal distention and abdominal pain.  Endocrine: Negative.   Genitourinary: Negative.   Musculoskeletal: Positive for back pain.  Hematological: Negative.   Psychiatric/Behavioral: Positive for agitation.    Past Medical History:  Diagnosis Date   Allergy    Anemia    Anxiety    Arthritis    Asthma    Back pain    Blood transfusion without reported diagnosis    Cancer of sigmoid colon ([place])    Constipation    Depression    Diabetes mellitus without complication ([facility])    Dyspnea    with exertion   Fatigue    Fatty liver    mild   GERD (gastroesophageal reflux disease)    Grade I diastolic dysfunction    Echo 08/08/2017 with Grade I DD, EF 51%, Mild Aortic regurgitation, Mild Tricuspid Regurgitation, Pulmonary pressure 30 mm Hg. She denies any  chest pain, dyspnea, or peripheral edema.   Headaches due to old head injury    History of stomach ulcers    Hyperlipidemia    Hypertension    Lactose intolerance    Left hip pain    Left thyroid nodule    Lipoma of extremity 10/17/2017   Left lower   Migraines    Obesity    TBI (traumatic brain injury) ([place])    Thyroid disease    Vitamin D deficiency     Past Surgical History:  Procedure Laterality Date   ABDOMINAL HYSTERECTOMY     BREAST LUMPECTOMY WITH RADIOACTIVE SEED LOCALIZATION Left 08/23/2016   Procedure: LEFT BREAST LUMPECTOMY WITH RADIOACTIVE SEED LOCALIZATION;  Surgeon: Rolm Bookbinder, MD;  Location: [facility];  Service: General;  Laterality: Left;   BREAST SURGERY  2003   reduction   CESAREAN SECTION     CHALAZION EXCISION Left 2010   COLONOSCOPY W/ POLYPECTOMY  02/16/2018   Dr. Delaney Meigs JOINT INJECTION  2011 and 2012   FLEXIBLE SIGMOIDOSCOPY N/A 04/18/2018   Procedure: FLEXIBLE SIGMOIDOSCOPY;  Surgeon: Ileana Roup, MD;  Location: [facility];  Service: General;  Laterality: N/A;   LAPAROSCOPIC SIGMOID COLECTOMY N/A 04/18/2018   Procedure: LAPAROSCOPIC ASSISTED SIGMOIDECTOMY;  Surgeon: Ileana Roup, MD;  Location: [facility];  Service: General;  Laterality: N/A;   MYOMECTOMY     NM MYOCAR PERF WALL MOTION  03/2009   dipyridamole; normal pattern of perfusion in all regions, post-stress EF 65%, normal study    THYROIDECTOMY  1989   partial   TRANSTHORACIC ECHOCARDIOGRAM  03/2009   EF=>55%, borderline conc LVH; mild mitral annular calcif, trace MR; trace TR; AV mildly sclerotic, mild AV regurg; mild pulm valve regurg    TUBAL LIGATION        Current Outpatient Medications:    amLODipine (NORVASC) 10 MG tablet, TAKE 1 TABLET BY MOUTH DAILY, Disp: 30 tablet, Rfl: 6   atorvastatin (LIPITOR) 40 MG tablet, Take 1 tablet (40 mg total) by mouth daily., Disp: 90 tablet, Rfl: 3   carvedilol (COREG) 6.25 MG tablet,  Take 1 tablet (6.25 mg total) by mouth 2 (two) times daily with a meal. TAKE 1 TABLET(3.125 MG) BY MOUTH TWICE DAILY, Disp: 180 tablet, Rfl: 3   cetirizine (ZYRTEC) 10 MG tablet, Take 10 mg by mouth daily as needed for allergies., Disp: , Rfl:    desvenlafaxine (PRISTIQ) 100 MG 24 hr tablet, Take 100 mg by mouth daily., Disp: , Rfl:    diclofenac (FLECTOR) 1.3 % PTCH, Place 1 patch onto the skin 2 (two) times daily as needed (pain). , Disp: , Rfl:    Diclofenac Potassium (CAMBIA) 50 MG PACK, Take 50 mg by mouth daily as needed (migraines). , Disp: , Rfl:    diclofenac sodium (VOLTAREN) 1 % GEL, Apply 2 g topically 4 (four) times daily. (Patient taking differently: Apply 2 g topically daily as needed (back pain). ), Disp: 100 g, Rfl: 3   fluconazole (DIFLUCAN) 150 MG tablet, Take 1 tablet (150 mg total) by mouth every three (3) days as needed., Disp: 3 tablet, Rfl: 0   Fluocinolone Acetonide Body (DERMA-SMOOTHE/FS BODY) 0.01 % OIL, Apply 1 application topically daily as needed. Leave on for 4 hours or overnight, then shampoo scalp, Disp: 118.28 mL, Rfl: 2   fluticasone (FLONASE) 50 MCG/ACT nasal spray, Place 2 sprays into both nostrils daily., Disp: 16 g, Rfl: 0   frovatriptan (FROVA) 2.5 MG tablet, Take 2.5 mg by mouth as needed for migraine. If recurs, may repeat after 2 hours. Max of 3 tabs in 24 hours., Disp: , Rfl:    gabapentin (NEURONTIN) 300 MG capsule, Take 300 mg by mouth 3 (three) times daily as needed (headaches). , Disp: , Rfl:    ibuprofen (ADVIL,MOTRIN) 200 MG tablet, Take 200 mg by mouth 3 (three) times daily as needed for headache or moderate pain., Disp: , Rfl:    ipratropium (ATROVENT) 0.03 % nasal spray, Place 2 sprays into the nose 4 (four) times daily. (Patient taking differently: Place 2 sprays into the nose 4 (four) times daily as needed for rhinitis. ), Disp: 30 mL, Rfl: 1   naproxen sodium (ALEVE) 220 MG tablet, Take 220 mg by mouth daily as needed (pain)., Disp: ,  Rfl:    nitroGLYCERIN (NITROSTAT) 0.4 MG SL tablet, Place 1 tablet (0.4 mg total) under the tongue every 5 (five) minutes as needed., Disp: 30 tablet, Rfl: 0   OLANZapine (ZYPREXA) 5 MG tablet, Take 5 mg by mouth at bedtime. Patient takes 2.5 mg. One tablet daily., Disp: , Rfl:    Olopatadine HCl (PATADAY) 0.2 % SOLN, Apply 1 drop daily as needed to eye (for itching)., Disp: 2.5 mL, Rfl: 9   omeprazole (PRILOSEC) 40 MG capsule, Take 40  mg by mouth daily., Disp: , Rfl:    polyethylene glycol (MIRALAX / GLYCOLAX) packet, Take 17 g by mouth daily., Disp: , Rfl:    sodium chloride (OCEAN) 0.65 % SOLN nasal spray, Place 1 spray into both nostrils as needed for congestion., Disp: 480 mL, Rfl: 0   valACYclovir (VALTREX) 1000 MG tablet, Take 1 tablet (1,000 mg total) by mouth daily. For suppression, Disp: 90 tablet, Rfl: 2   valACYclovir (VALTREX) 500 MG tablet, TAKE 1 TABLET BY MOUTH TWICE DAILY FOR 3 DAYS AS NEEDED FOR OUTBREAK, Disp: 30 tablet, Rfl: 1   valsartan (DIOVAN) 320 MG tablet, Take 1 tablet (320 mg total) by mouth daily., Disp: 90 tablet, Rfl: 3   Objective:   There were no vitals filed for this visit.  Physical Exam Vitals and nursing note reviewed.  Constitutional:      Appearance: Normal appearance.  HENT:     Head: Normocephalic and atraumatic.  Cardiovascular:     Rate and Rhythm: Normal rate.     Pulses: Normal pulses.  Pulmonary:     Effort: Pulmonary effort is normal.  Abdominal:     General: There is no distension.     Palpations: There is no mass.     Tenderness: There is no abdominal tenderness.     Hernia: No hernia is present.  Skin:    General: Skin is warm.     Capillary Refill: Capillary refill takes less than 2 seconds.  Neurological:     General: No focal deficit present.     Mental Status: She is alert and oriented to person, place, and time.  Psychiatric:        Mood and Affect: Mood normal.        Behavior: Behavior normal.     Assessment  & Plan:  Dyslipidemia  Left hip pain  We will get a release of information from her injections of her spine as well as from her PCP.    She is a good candidate for a panniculectomy.  I explained that there is a limit to the amount that is removed.  Generally speaking the area above the umbilicus is not excised.  She is aware and interested in having the surgery.  I also explained that due to her size she is at increased risk for wound breakdown and she seems to understand that.  [place], DO

## 2020-06-05 NOTE — Telephone Encounter (Signed)
Returned call-patient wanted to verify she is suppose to be taking amlodipine 10 mg daily.   Advised per last OV note she was to continue taking-she verbalized understanding.  New rx sent.  Advised to keep upcoming OV with Dr. Debara Pickett.

## 2020-06-09 ENCOUNTER — Inpatient Hospital Stay: Admission: RE | Admit: 2020-06-09 | Payer: BC Managed Care – PPO | Source: Ambulatory Visit

## 2020-06-09 ENCOUNTER — Ambulatory Visit
Admission: RE | Admit: 2020-06-09 | Discharge: 2020-06-09 | Disposition: A | Discharge status: Home / Self Care | Payer: BC Managed Care – PPO | Source: Ambulatory Visit | Attending: Family Medicine | Admitting: Family Medicine

## 2020-06-09 ENCOUNTER — Other Ambulatory Visit: Payer: Self-pay

## 2020-06-09 DIAGNOSIS — E2839 Other primary ovarian failure: Secondary | ICD-10-CM

## 2020-06-23 ENCOUNTER — Ambulatory Visit: Payer: BC Managed Care – PPO | Admitting: Internal Medicine

## 2020-06-24 ENCOUNTER — Encounter: Payer: Self-pay | Admitting: Internal Medicine

## 2020-06-24 ENCOUNTER — Other Ambulatory Visit: Payer: Self-pay

## 2020-06-24 ENCOUNTER — Ambulatory Visit: Payer: BC Managed Care – PPO | Admitting: Internal Medicine

## 2020-06-24 VITALS — BP 130/70 | HR 59 | Ht 63.0 in | Wt 212.0 lb

## 2020-06-24 DIAGNOSIS — I708 Atherosclerosis of other arteries: Secondary | ICD-10-CM

## 2020-06-24 DIAGNOSIS — E669 Obesity, unspecified: Secondary | ICD-10-CM

## 2020-06-24 DIAGNOSIS — I10 Essential (primary) hypertension: Secondary | ICD-10-CM | POA: Diagnosis not present

## 2020-06-24 DIAGNOSIS — E785 Hyperlipidemia, unspecified: Secondary | ICD-10-CM

## 2020-06-24 NOTE — Patient Instructions (Signed)

## 2020-06-24 NOTE — Progress Notes (Signed)
OFFICE NOTE  Chief Complaint:  No complaints  Primary Care Physician: Darreld Mclean, MD  HPI:  [name] is a pleasant 69 year old female with a strong family history of heart disease. Her mother died of a sudden heart attack at age 40, her both grandparents had heart disease and a grandmother had a stroke at age 32. She was seen remotely by cardiologist more than 3 years ago and had stress testing at that time which was negative. Back in November she was complaining of chest pain which was intermittent. It was somewhat worse at that time however she had an active bronchitis and was treated. Her symptoms improved somewhat however she still had some chest pain since that time. Some of it is associated with exertion and relieved by rest and other times it comes fairly randomly. It does feel like a squeezing sensation in the upper chest it does not radiate to the neck, arm or back. She describes the discomfort as a 5 or 6/10 in severity.  She occasionally takes nitroglycerin for this however her old prescription was only recently renewed.  She is desiring to start an exercise program and would like cardiovascular evaluation and risk stratification prior to that.  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  [name] had a recent nuclear stress test which was negative.  This demonstrated no reversible ischemia. She also had a lipid NMR performed, which demonstrated elevated LDL cholesterol of 117, with elevated LDL particle numbers to 1347.  Her contents a metabolic profile was normal.  Her blood pressure today is much improved. She denies any further chest pain and only occasionally has migraine headaches. We have not assessed her cholesterol since her last lipid profile and she's not on treatment  for her elevated cholesterol.  I saw [name] back in the office today. Her main complaint is some discomfort over the right trapezius area. She says this is usually worse with stress and tension. She denies any chest pain per se today. Recently her headaches have improved somewhat. Her cholesterol has been fairly well-controlled. Her blood pressure is at goal today. Unfortunate she's not managed to lose much weight and remains close to morbid obesity with BMI of 38.  08/02/2016  Returns today for follow-up. Over the past year she's had no complaints. Recently she was found to have a breast mass and is undergoing a biopsy later this month. She's not had a recent cholesterol check in the past year but had a fairly good cholesterol profile last saw her. She is continue to work on weight loss. She's on medication for depression which she says is working well. Her only issue right now is memory loss.  08/03/2017  [name] was seen today in follow-up. She has a migraine today. Overall though her migraines have improved some from her history of traumatic brain injury. She's taking less medication for that. She is more committed to primary prevention. Her lipid profile has improved some with weight loss recently [facility] Dr. Leafy Ro. She is optimistic about further weight loss which should be helpful for her risk reduction. Blood pressure is elevated today however possibly attributable to her migraine. Her last lipid profile showed an LDL of 119. She's not on a statin and  is a borderline diabetic on metformin. She has a significant family history of early onset coronary disease. She denies chest pain or shortness of breath with exertion.  09/14/2018  [name] is seen today in annual follow-up.  Unfortunately she recently was diagnosed with colon cancer.  She underwent surgical resection and is deemed cured of it.  She does have follow-up in  a year.  She did not require chemotherapy or radiation.  She was working on weight loss and was seen Dr. Leafy Ro however after colon surgery she could not tolerate the diet she was supposed to be on.  She had lost weight but then gained it back.  Overall she is feeling well.  Her blood sugars were borderline and she remained on metformin.  We discussed the importance of lowering her cholesterol which have been elevated with LDL at 130.  Earlier this year her LDL had come down to 118 with weight loss and dietary changes however she does not think that she will be able to make the significant dietary changes she needs to get it lower.  10/04/2019  [name] returns today for follow-up.  Overall she is under quite a bit of stress.  She is noted her blood pressures have been high recently, even higher than it was today 163/85.  She occasionally gets some right-sided chest discomfort which is worse when she is stressed.  She does not note any symptoms noticed necessarily when walking or exerting herself.  She does like to walk for exercise.  She can walk quite a distance without any leg pain but occasionally gets shooting pains in her legs.  She also has noted some numbness and tingling in her feet.  She is on gabapentin which she takes for headaches but has no diagnosis of neuropathy.  She is free of colon cancer now more than a year out.  CT of the chest for follow-up of cancer did not demonstrate any evidence of coronary calcification that I saw.  Recent lipid showed total cholesterol 140, triglycerides 82, HDL 50 and LDL 74.  06/24/2020  [name] returns today for follow-up.  Blood pressures well controlled today.  She denies any chest pain or worsening shortness of breath.  EKG shows a sinus bradycardia.  Labs from September 2020 showed an LDL 74 hemoglobin A1c 5.7.  PMHx:  Past Medical History:  Diagnosis Date  . Allergy   . Anemia   . Anxiety   . Arthritis   . Asthma   . Back pain     . Blood transfusion without reported diagnosis   . Cancer of sigmoid colon ([place])   . Constipation   . Depression   . Diabetes mellitus without complication ([place])   . Dyspnea    with exertion  . Fatigue   . Fatty liver    mild  . GERD (gastroesophageal reflux disease)   . Grade I diastolic dysfunction    Echo 08/08/2017 with Grade I DD, EF 51%, Mild Aortic regurgitation, Mild Tricuspid Regurgitation, Pulmonary pressure 30 mm Hg. She denies any chest pain, dyspnea, or peripheral edema.  . Headaches due to old head injury   . History of stomach ulcers   . Hyperlipidemia   . Hypertension   . Lactose intolerance   . Left hip pain   . Left thyroid nodule   . Lipoma of extremity 10/17/2017   Left lower  . Migraines   . Obesity   . TBI (traumatic brain injury) ([place])   . Thyroid  disease   . Vitamin D deficiency     Past Surgical History:  Procedure Laterality Date  . ABDOMINAL HYSTERECTOMY    . BREAST LUMPECTOMY WITH RADIOACTIVE SEED LOCALIZATION Left 08/23/2016   Procedure: LEFT BREAST LUMPECTOMY WITH RADIOACTIVE SEED LOCALIZATION;  Surgeon: Rolm Bookbinder, MD;  Location: [facility];  Service: General;  Laterality: Left;  . BREAST SURGERY  2003   reduction  . CESAREAN SECTION    . CHALAZION EXCISION Left 2010  . COLONOSCOPY W/ POLYPECTOMY  02/16/2018   Dr. Ardis Hughs  . FACET JOINT INJECTION  2011 and 2012  . FLEXIBLE SIGMOIDOSCOPY N/A 04/18/2018   Procedure: FLEXIBLE SIGMOIDOSCOPY;  Surgeon: Ileana Roup, MD;  Location: [facility];  Service: General;  Laterality: N/A;  . LAPAROSCOPIC SIGMOID COLECTOMY N/A 04/18/2018   Procedure: LAPAROSCOPIC ASSISTED SIGMOIDECTOMY;  Surgeon: Ileana Roup, MD;  Location: [facility];  Service: General;  Laterality: N/A;  . MYOMECTOMY    . NM MYOCAR PERF WALL MOTION  03/2009   dipyridamole; normal pattern of perfusion in all regions, post-stress EF 65%, normal study   . THYROIDECTOMY  1989   partial  . TRANSTHORACIC  ECHOCARDIOGRAM  03/2009   EF=>55%, borderline conc LVH; mild mitral annular calcif, trace MR; trace TR; AV mildly sclerotic, mild AV regurg; mild pulm valve regurg   . TUBAL LIGATION      FAMHx:  Family History  Problem Relation Age of Onset  . Heart disease Mother        MI, HTN  . Hypertension Mother   . Thyroid disease Mother   . Hyperlipidemia Mother   . Sudden death Mother   . Anxiety disorder Mother   . Heart disease Maternal Grandmother        MI, CVA  . Heart disease Maternal Grandfather        MI  . Stroke Maternal Grandfather   . Heart disease Paternal Grandmother   . Stroke Paternal Grandmother   . Heart disease Paternal Grandfather        MI  . Glaucoma Father   . Diabetes Father   . Depression Daughter   . ADD / ADHD Son   . Thyroid disease Sister   . Heart attack Paternal Uncle        HTN  . Hypertension Paternal Uncle   . Colon cancer Neg Hx   . Stomach cancer Neg Hx   . Esophageal cancer Neg Hx   . Rectal cancer Neg Hx     SOCHx:   reports that she has never smoked. She has never used smokeless tobacco. She reports current alcohol use. She reports that she does not use drugs.  ALLERGIES:  Allergies  Allergen Reactions  . Hydrochlorothiazide Other (See Comments)    Increases risk of gout flares   . Botox [Botulinum Toxin Type A] Swelling  . Onabotulinumtoxina Swelling  . Toprol Xl [Metoprolol Succinate] Hives    ROS: Pertinent items noted in HPI and remainder of comprehensive ROS otherwise negative.  HOME MEDS: Current Outpatient Medications  Medication Sig Dispense Refill  . amLODipine (NORVASC) 10 MG tablet Take 1 tablet (10 mg total) by mouth daily. 30 tablet 5  . butalbital-acetaminophen-caffeine (FIORICET) 50-325-40 MG tablet 1-2 po q 6 hrs prn-max 3/day    . carvedilol (COREG) 6.25 MG tablet Take 1 tablet (6.25 mg total) by mouth 2 (two) times daily with a meal. TAKE 1 TABLET(3.125 MG) BY MOUTH TWICE DAILY 180 tablet 3  . cetirizine  (ZYRTEC) 10  MG tablet Take 10 mg by mouth daily as needed for allergies.    Marland Kitchen desvenlafaxine (PRISTIQ) 100 MG 24 hr tablet Take 100 mg by mouth daily.    . diclofenac (FLECTOR) 1.3 % PTCH Place 1 patch onto the skin 2 (two) times daily as needed (pain).     . Diclofenac Potassium (CAMBIA) 50 MG PACK Take 50 mg by mouth daily as needed (migraines).     . diclofenac sodium (VOLTAREN) 1 % GEL Apply 2 g topically 4 (four) times daily. (Patient taking differently: Apply 2 g topically daily as needed (back pain). ) 100 g 3  . fluconazole (DIFLUCAN) 150 MG tablet Take 1 tablet (150 mg total) by mouth every three (3) days as needed. 3 tablet 0  . Fluocinolone Acetonide Body (DERMA-SMOOTHE/FS BODY) 0.01 % OIL Apply 1 application topically daily as needed. Leave on for 4 hours or overnight, then shampoo scalp 118.28 mL 2  . fluticasone (FLONASE) 50 MCG/ACT nasal spray Place 2 sprays into both nostrils daily. 16 g 0  . frovatriptan (FROVA) 2.5 MG tablet Take 2.5 mg by mouth as needed for migraine. If recurs, may repeat after 2 hours. Max of 3 tabs in 24 hours.    . gabapentin (NEURONTIN) 300 MG capsule Take 300 mg by mouth 3 (three) times daily as needed (headaches).     Marland Kitchen ibuprofen (ADVIL,MOTRIN) 200 MG tablet Take 200 mg by mouth 3 (three) times daily as needed for headache or moderate pain.    Marland Kitchen ipratropium (ATROVENT) 0.03 % nasal spray Place 2 sprays into the nose 4 (four) times daily. (Patient taking differently: Place 2 sprays into the nose 4 (four) times daily as needed for rhinitis. ) 30 mL 1  . Misc. Devices MISC Patient to be out of work until next appointment follow in 8 weeks.    . naproxen sodium (ALEVE) 220 MG tablet Take 220 mg by mouth daily as needed (pain).    . nitroGLYCERIN (NITROSTAT) 0.4 MG SL tablet Place 1 tablet (0.4 mg total) under the tongue every 5 (five) minutes as needed. 30 tablet 0  . OLANZapine (ZYPREXA) 5 MG tablet Take 5 mg by mouth at bedtime. Patient takes 2.5 mg. One tablet  daily.    . Olopatadine HCl (PATADAY) 0.2 % SOLN Apply 1 drop daily as needed to eye (for itching). 2.5 mL 9  . omeprazole (PRILOSEC) 40 MG capsule Take 40 mg by mouth daily.    . polyethylene glycol (MIRALAX / GLYCOLAX) packet Take 17 g by mouth daily.    . sodium chloride (OCEAN) 0.65 % SOLN nasal spray Place 1 spray into both nostrils as needed for congestion. 480 mL 0  . valACYclovir (VALTREX) 1000 MG tablet Take 1 tablet (1,000 mg total) by mouth daily. For suppression 90 tablet 2  . valsartan (DIOVAN) 320 MG tablet Take 1 tablet (320 mg total) by mouth daily. 90 tablet 3  . atorvastatin (LIPITOR) 40 MG tablet Take 1 tablet (40 mg total) by mouth daily. 90 tablet 3   No current facility-administered medications for this visit.    LABS/IMAGING: No results found for this or any previous visit (from the past 48 hour(s)). No results found.  VITALS: BP (!) 130/70   Pulse 59   Ht 5\' 3"  (1.6 m)   Wt (!) 212 lb (96.2 kg)   SpO2 96%   BMI 37.55 kg/m   EXAM: General appearance: alert, no distress and moderately obese Neck: no carotid bruit and no JVD  Lungs: clear to auscultation bilaterally Heart: regular rate and rhythm, S1, S2 normal, no murmur, click, rub or gallop Abdomen: soft, non-tender; bowel sounds normal; no masses,  no organomegaly Extremities: extremities normal, atraumatic, no cyanosis or edema Pulses: 2+ and symmetric Skin: Skin color, texture, turgor normal. No rashes or lesions Neurologic: Grossly normal  EKG: Sinus bradycardia with RSR prime in V1 59-personally reviewed  ASSESSMENT: 1. Dyslipidemia 2. Hypertension 3. Obesity - [facility]  4. History of TBI with headaches and memory loss 5. Strong family history of premature coronary artery disease 6. Mild type 2 diabetes 7. Recent colon cancer status post resection  PLAN: 1.   [name] seems to have better controlled hypertension today.  She has managed to lose  some weight as well.  I encouraged her to continue to work on that.  Cholesterol is very near goal.  A1c is well controlled.  Follow-up annually or sooner as necessary.  Pixie Casino, MD, [facility], [place] Director of the [facility]  [facility] Diplomate of the American Board of Clinical Lipidology Attending Cardiologist  Direct Dial: [phone]  Fax: [phone]  Website:  [email or web address]  Nadean Corwin Archie Atilano 06/24/2020, 3:26 PM

## 2020-07-06 ENCOUNTER — Encounter: Payer: Self-pay | Admitting: Family Medicine

## 2020-08-09 NOTE — Progress Notes (Signed)
ICD-10-CM   1. Abdominal pannus  E65       Patient ID: [name], female    DOB: August 03, 1951, 69 y.o.   MRN: [id number]   History of Present Illness: [name] is a 69 y.o.  female  with a history of abdominal pannus.  She presents for preoperative evaluation for upcoming procedure, panniculectomy with possible liposuction, scheduled for 09/02/2020 with Dr. Marla Roe.  Summary from previous visit: Patient is 5 feet 3 inches tall and weighs 216 pounds.  She has had colon surgery for cancer, C-sections, and hysterectomy.  She complains of chronic sores, draining, and wounds under her pannus.  She has tried all kinds of creams as well as oral medications given to her by her PCP.  She has a very thick heavy pannus with some skin irritation in the folds.  Job:N/A  PMH Significant for: Anemia, anxiety, asthma, cancer of sigmoid colon, DM, dyspnea with exertion, GERD, fatty liver, grade 1 diastolic dysfunction, HA, HLD, HTN, TBI, thyroid disease  The patient has not had problems with anesthesia.   Past Medical History: Allergies: Allergies  Allergen Reactions  . Hydrochlorothiazide Other (See Comments)    Increases risk of gout flares   . Botox [Botulinum Toxin Type A] Swelling  . Onabotulinumtoxina Swelling  . Toprol Xl [Metoprolol Succinate] Hives    Current Medications:  Current Outpatient Medications:  .  amLODipine (NORVASC) 10 MG tablet, Take 1 tablet (10 mg total) by mouth daily., Disp: 30 tablet, Rfl: 5 .  butalbital-acetaminophen-caffeine (FIORICET) 50-325-40 MG tablet, 1-2 po q 6 hrs prn-max 3/day, Disp: , Rfl:  .  carvedilol (COREG) 6.25 MG tablet, Take 1 tablet (6.25 mg total) by mouth 2 (two) times daily with a meal. TAKE 1 TABLET(3.125 MG) BY MOUTH TWICE DAILY, Disp: 180 tablet, Rfl: 3 .  cetirizine (ZYRTEC) 10 MG tablet, Take 10 mg by mouth daily as needed for allergies., Disp: , Rfl:  .  desvenlafaxine (PRISTIQ) 100 MG 24 hr tablet, Take  100 mg by mouth daily., Disp: , Rfl:  .  diclofenac (FLECTOR) 1.3 % PTCH, Place 1 patch onto the skin 2 (two) times daily as needed (pain). , Disp: , Rfl:  .  Diclofenac Potassium (CAMBIA) 50 MG PACK, Take 50 mg by mouth daily as needed (migraines). , Disp: , Rfl:  .  diclofenac sodium (VOLTAREN) 1 % GEL, Apply 2 g topically 4 (four) times daily. (Patient taking differently: Apply 2 g topically daily as needed (back pain). ), Disp: 100 g, Rfl: 3 .  fluconazole (DIFLUCAN) 150 MG tablet, Take 1 tablet (150 mg total) by mouth every three (3) days as needed., Disp: 3 tablet, Rfl: 0 .  Fluocinolone Acetonide Body (DERMA-SMOOTHE/FS BODY) 0.01 % OIL, Apply 1 application topically daily as needed. Leave on for 4 hours or overnight, then shampoo scalp, Disp: 118.28 mL, Rfl: 2 .  fluticasone (FLONASE) 50 MCG/ACT nasal spray, Place 2 sprays into both nostrils daily., Disp: 16 g, Rfl: 0 .  frovatriptan (FROVA) 2.5 MG tablet, Take 2.5 mg by mouth as needed for migraine. If recurs, may repeat after 2 hours. Max of 3 tabs in 24 hours., Disp: , Rfl:  .  gabapentin (NEURONTIN) 300 MG capsule, Take 300 mg by mouth 3 (three) times daily as needed (headaches). , Disp: , Rfl:  .  ibuprofen (ADVIL,MOTRIN) 200 MG tablet, Take 200 mg by mouth 3 (three) times daily as needed for headache or moderate pain., Disp: , Rfl:  .  ipratropium (ATROVENT) 0.03 % nasal spray, Place 2 sprays into the nose 4 (four) times daily. (Patient taking differently: Place 2 sprays into the nose 4 (four) times daily as needed for rhinitis. ), Disp: 30 mL, Rfl: 1 .  Misc. Devices MISC, Patient to be out of work until next appointment follow in 8 weeks., Disp: , Rfl:  .  naproxen sodium (ALEVE) 220 MG tablet, Take 220 mg by mouth daily as needed (pain)., Disp: , Rfl:  .  nitroGLYCERIN (NITROSTAT) 0.4 MG SL tablet, Place 1 tablet (0.4 mg total) under the tongue every 5 (five) minutes as needed., Disp: 30 tablet, Rfl: 0 .  OLANZapine (ZYPREXA) 5 MG  tablet, Take 5 mg by mouth at bedtime. Patient takes 2.5 mg. One tablet daily., Disp: , Rfl:  .  Olopatadine HCl (PATADAY) 0.2 % SOLN, Apply 1 drop daily as needed to eye (for itching)., Disp: 2.5 mL, Rfl: 9 .  omeprazole (PRILOSEC) 40 MG capsule, Take 40 mg by mouth daily., Disp: , Rfl:  .  polyethylene glycol (MIRALAX / GLYCOLAX) packet, Take 17 g by mouth daily., Disp: , Rfl:  .  sodium chloride (OCEAN) 0.65 % SOLN nasal spray, Place 1 spray into both nostrils as needed for congestion., Disp: 480 mL, Rfl: 0 .  valACYclovir (VALTREX) 1000 MG tablet, Take 1 tablet (1,000 mg total) by mouth daily. For suppression, Disp: 90 tablet, Rfl: 2 .  valsartan (DIOVAN) 320 MG tablet, Take 1 tablet (320 mg total) by mouth daily., Disp: 90 tablet, Rfl: 3 .  atorvastatin (LIPITOR) 40 MG tablet, Take 1 tablet (40 mg total) by mouth daily., Disp: 90 tablet, Rfl: 3 .  cephALEXin (KEFLEX) 500 MG capsule, Take 1 capsule (500 mg total) by mouth 4 (four) times daily for 3 days. For use AFTER Surgery, Disp: 12 capsule, Rfl: 0 .  HYDROcodone-acetaminophen (NORCO) 5-325 MG tablet, Take 1 tablet by mouth every 8 (eight) hours as needed for up to 7 days for severe pain. For use AFTER Surgery, Disp: 21 tablet, Rfl: 0 .  ondansetron (ZOFRAN) 4 MG tablet, Take 1 tablet (4 mg total) by mouth every 8 (eight) hours as needed for nausea or vomiting., Disp: 20 tablet, Rfl: 0  Past Medical Problems: Past Medical History:  Diagnosis Date  . Allergy   . Anemia   . Anxiety   . Arthritis   . Asthma   . Back pain   . Blood transfusion without reported diagnosis   . Cancer of sigmoid colon ([place])   . Constipation   . Depression   . Diabetes mellitus without complication ([place])   . Dyspnea    with exertion  . Fatigue   . Fatty liver    mild  . GERD (gastroesophageal reflux disease)   . Grade I diastolic dysfunction    Echo 08/08/2017 with Grade I DD, EF 51%, Mild Aortic regurgitation, Mild Tricuspid Regurgitation, Pulmonary  pressure 30 mm Hg. She denies any chest pain, dyspnea, or peripheral edema.  . Headaches due to old head injury   . History of stomach ulcers   . Hyperlipidemia   . Hypertension   . Lactose intolerance   . Left hip pain   . Left thyroid nodule   . Lipoma of extremity 10/17/2017   Left lower  . Migraines   . Obesity   . TBI (traumatic brain injury) ([place])   . Thyroid disease   . Vitamin D deficiency     Past Surgical History: Past Surgical History:  Procedure Laterality Date  . ABDOMINAL HYSTERECTOMY    . BREAST LUMPECTOMY WITH RADIOACTIVE SEED LOCALIZATION Left 08/23/2016   Procedure: LEFT BREAST LUMPECTOMY WITH RADIOACTIVE SEED LOCALIZATION;  Surgeon: Rolm Bookbinder, MD;  Location: [facility];  Service: General;  Laterality: Left;  . BREAST SURGERY  2003   reduction  . CESAREAN SECTION    . CHALAZION EXCISION Left 2010  . COLONOSCOPY W/ POLYPECTOMY  02/16/2018   Dr. Ardis Hughs  . FACET JOINT INJECTION  2011 and 2012  . FLEXIBLE SIGMOIDOSCOPY N/A 04/18/2018   Procedure: FLEXIBLE SIGMOIDOSCOPY;  Surgeon: Ileana Roup, MD;  Location: [facility];  Service: General;  Laterality: N/A;  . LAPAROSCOPIC SIGMOID COLECTOMY N/A 04/18/2018   Procedure: LAPAROSCOPIC ASSISTED SIGMOIDECTOMY;  Surgeon: Ileana Roup, MD;  Location: [facility];  Service: General;  Laterality: N/A;  . MYOMECTOMY    . NM MYOCAR PERF WALL MOTION  03/2009   dipyridamole; normal pattern of perfusion in all regions, post-stress EF 65%, normal study   . THYROIDECTOMY  1989   partial  . TRANSTHORACIC ECHOCARDIOGRAM  03/2009   EF=>55%, borderline conc LVH; mild mitral annular calcif, trace MR; trace TR; AV mildly sclerotic, mild AV regurg; mild pulm valve regurg   . TUBAL LIGATION      Social History: Social History   Socioeconomic History  . Marital status: Married    Spouse name: Carloyn Manner  . Number of children: 2  . Years of education: Not on file  . Highest education level: Not on file    Occupational History  . Occupation: retired    Fish farm manager: [employer]  Tobacco Use  . Smoking status: Never Smoker  . Smokeless tobacco: Never Used  Vaping Use  . Vaping Use: Never used  Substance and Sexual Activity  . Alcohol use: Yes    Comment: occ. beer  . Drug use: No  . Sexual activity: Not on file  Other Topics Concern  . Not on file  Social History Narrative  . Not on file   Social Determinants of Health   Financial Resource Strain:   . Difficulty of Paying Living Expenses: Not on file  Food Insecurity:   . Worried About Charity fundraiser in the Last Year: Not on file  . Ran Out of Food in the Last Year: Not on file  Transportation Needs:   . Lack of Transportation (Medical): Not on file  . Lack of Transportation (Non-Medical): Not on file  Physical Activity:   . Days of Exercise per Week: Not on file  . Minutes of Exercise per Session: Not on file  Stress:   . Feeling of Stress : Not on file  Social Connections:   . Frequency of Communication with Friends and Family: Not on file  . Frequency of Social Gatherings with Friends and Family: Not on file  . Attends Religious Services: Not on file  . Active Member of Clubs or Organizations: Not on file  . Attends Archivist Meetings: Not on file  . Marital Status: Not on file  Intimate Partner Violence:   . Fear of Current or Ex-Partner: Not on file  . Emotionally Abused: Not on file  . Physically Abused: Not on file  . Sexually Abused: Not on file    Family History: Family History  Problem Relation Age of Onset  . Heart disease Mother        MI, HTN  . Hypertension Mother   . Thyroid disease Mother   .  Hyperlipidemia Mother   . Sudden death Mother   . Anxiety disorder Mother   . Heart disease Maternal Grandmother        MI, CVA  . Heart disease Maternal Grandfather        MI  . Stroke Maternal Grandfather   . Heart disease Paternal Grandmother   . Stroke Paternal Grandmother    . Heart disease Paternal Grandfather        MI  . Glaucoma Father   . Diabetes Father   . Depression Daughter   . ADD / ADHD Son   . Thyroid disease Sister   . Heart attack Paternal Uncle        HTN  . Hypertension Paternal Uncle   . Colon cancer Neg Hx   . Stomach cancer Neg Hx   . Esophageal cancer Neg Hx   . Rectal cancer Neg Hx     Review of Systems: Review of Systems  Constitutional: Negative for chills and fever.  HENT: Negative for congestion and sore throat.   Respiratory: Negative for cough and shortness of breath.   Cardiovascular: Negative for chest pain and palpitations.  Gastrointestinal: Negative for abdominal pain, nausea and vomiting.  Musculoskeletal: Positive for back pain. Negative for joint pain and myalgias.  Skin: Negative for itching and rash.    Physical Exam: Vital Signs BP (!) 155/81 (BP Location: Left Arm, Patient Position: Sitting, Cuff Size: Large)   Pulse 66   Temp 98.4 F (36.9 C) (Oral)   Ht 5\' 3"  (1.6 m)   Wt 211 lb (95.7 kg)   SpO2 97%   BMI 37.38 kg/m  Physical Exam Constitutional:      General: She is not in acute distress.    Appearance: Normal appearance. She is obese. She is not ill-appearing.  HENT:     Head: Normocephalic and atraumatic.  Eyes:     Extraocular Movements: Extraocular movements intact.  Cardiovascular:     Rate and Rhythm: Normal rate and regular rhythm.     Pulses: Normal pulses.     Heart sounds: Normal heart sounds.  Pulmonary:     Effort: Pulmonary effort is normal.     Breath sounds: Normal breath sounds. No wheezing, rhonchi or rales.  Abdominal:     General: Bowel sounds are normal.     Palpations: Abdomen is soft.  Musculoskeletal:        General: No swelling. Normal range of motion.     Cervical back: Normal range of motion.  Skin:    General: Skin is warm and dry.     Coloration: Skin is not pale.     Findings: No erythema or rash.  Neurological:     General: No focal deficit present.      Mental Status: She is alert and oriented to person, place, and time.  Psychiatric:        Mood and Affect: Mood normal.        Behavior: Behavior normal.        Thought Content: Thought content normal.        Judgment: Judgment normal.     Assessment/Plan:  [name] scheduled for panniculectomy with possible liposuction with Dr. Marla Roe.  Risks, benefits, and alternatives of procedure discussed, questions answered and consent obtained.    Smoking Status: non-smoker; Counseling Given? N/A  Surgical Clearance Requests sent to Dr. Debara Pickett - [facility]; Dr. Lorelei Pont - PCP  Caprini Score: 7 High; Risk Factors include: 69 year old female, Hx sigmoid colon cancer,  BMI > 25, and length of planned surgery. Recommendation for mechanical and pharmacological prophylaxis during surgery. Encourage early ambulation.   Pictures obtained: 06/05/20  Post-op Rx sent to pharmacy: Norco, Zofran, Keflex  Patient was provided with the General Surgical Risk consent document and Pain Medication Agreement prior to their appointment.  They had adequate time to read through the risk consent documents and Pain Medication Agreement. We also discussed them in person together during this preop appointment. All of their questions were answered to their satisfaction.  Recommended calling if they have any further questions.  Risk consent form and Pain Medication Agreement to be scanned into patient's chart.  The risk that can be encountered for this procedure were discussed and include the following but not limited to these: asymmetry, fluid accumulation, firmness of the tissue, skin loss, decrease or no sensation, fat necrosis, bleeding, infection, healing delay.  Deep vein thrombosis, cardiac and pulmonary complications are risks to any procedure.  There are risks of anesthesia, changes to skin sensation and injury to nerves or blood vessels.  The muscle can be temporarily or permanently injured.  You may have an  allergic reaction to tape, suture, glue, blood products which can result in skin discoloration, swelling, pain, skin lesions, poor healing.  Any of these can lead to the need for revisonal surgery or stage procedures.  Weight gain and weigh loss can also effect the long term appearance. The results are not guaranteed to last a lifetime.  Future surgery may be required.    The [place] was signed into law in 2016 which includes the topic of electronic health records.  This provides immediate access to information in MyChart.  This includes consultation notes, operative notes, office notes, lab results and pathology reports.  If you have any questions about what you read please let us know at your next visit or call us at the office.  We are right here with you.   Electronically signed by: Threasa Heads, PA-C 08/11/2020 12:31 PM

## 2020-08-09 NOTE — H&P (View-Only) (Signed)
ICD-10-CM   1. Abdominal pannus  E65       Patient ID: [name], female    DOB: 1951-04-16, 69 y.o.   MRN: [id number]   History of Present Illness: [name] is a 69 y.o.  female  with a history of abdominal pannus.  She presents for preoperative evaluation for upcoming procedure, panniculectomy with possible liposuction, scheduled for 09/02/2020 with Dr. Marla Roe.  Summary from previous visit: Patient is 5 feet 3 inches tall and weighs 216 pounds.  She has had colon surgery for cancer, C-sections, and hysterectomy.  She complains of chronic sores, draining, and wounds under her pannus.  She has tried all kinds of creams as well as oral medications given to her by her PCP.  She has a very thick heavy pannus with some skin irritation in the folds.  Job:N/A  PMH Significant for: Anemia, anxiety, asthma, cancer of sigmoid colon, DM, dyspnea with exertion, GERD, fatty liver, grade 1 diastolic dysfunction, HA, HLD, HTN, TBI, thyroid disease  The patient has not had problems with anesthesia.   Past Medical History: Allergies: Allergies  Allergen Reactions  . Hydrochlorothiazide Other (See Comments)    Increases risk of gout flares   . Botox [Botulinum Toxin Type A] Swelling  . Onabotulinumtoxina Swelling  . Toprol Xl [Metoprolol Succinate] Hives    Current Medications:  Current Outpatient Medications:  .  amLODipine (NORVASC) 10 MG tablet, Take 1 tablet (10 mg total) by mouth daily., Disp: 30 tablet, Rfl: 5 .  butalbital-acetaminophen-caffeine (FIORICET) 50-325-40 MG tablet, 1-2 po q 6 hrs prn-max 3/day, Disp: , Rfl:  .  carvedilol (COREG) 6.25 MG tablet, Take 1 tablet (6.25 mg total) by mouth 2 (two) times daily with a meal. TAKE 1 TABLET(3.125 MG) BY MOUTH TWICE DAILY, Disp: 180 tablet, Rfl: 3 .  cetirizine (ZYRTEC) 10 MG tablet, Take 10 mg by mouth daily as needed for allergies., Disp: , Rfl:  .  desvenlafaxine (PRISTIQ) 100 MG 24 hr tablet, Take  100 mg by mouth daily., Disp: , Rfl:  .  diclofenac (FLECTOR) 1.3 % PTCH, Place 1 patch onto the skin 2 (two) times daily as needed (pain). , Disp: , Rfl:  .  Diclofenac Potassium (CAMBIA) 50 MG PACK, Take 50 mg by mouth daily as needed (migraines). , Disp: , Rfl:  .  diclofenac sodium (VOLTAREN) 1 % GEL, Apply 2 g topically 4 (four) times daily. (Patient taking differently: Apply 2 g topically daily as needed (back pain). ), Disp: 100 g, Rfl: 3 .  fluconazole (DIFLUCAN) 150 MG tablet, Take 1 tablet (150 mg total) by mouth every three (3) days as needed., Disp: 3 tablet, Rfl: 0 .  Fluocinolone Acetonide Body (DERMA-SMOOTHE/FS BODY) 0.01 % OIL, Apply 1 application topically daily as needed. Leave on for 4 hours or overnight, then shampoo scalp, Disp: 118.28 mL, Rfl: 2 .  fluticasone (FLONASE) 50 MCG/ACT nasal spray, Place 2 sprays into both nostrils daily., Disp: 16 g, Rfl: 0 .  frovatriptan (FROVA) 2.5 MG tablet, Take 2.5 mg by mouth as needed for migraine. If recurs, may repeat after 2 hours. Max of 3 tabs in 24 hours., Disp: , Rfl:  .  gabapentin (NEURONTIN) 300 MG capsule, Take 300 mg by mouth 3 (three) times daily as needed (headaches). , Disp: , Rfl:  .  ibuprofen (ADVIL,MOTRIN) 200 MG tablet, Take 200 mg by mouth 3 (three) times daily as needed for headache or moderate pain., Disp: , Rfl:  .  ipratropium (ATROVENT) 0.03 % nasal spray, Place 2 sprays into the nose 4 (four) times daily. (Patient taking differently: Place 2 sprays into the nose 4 (four) times daily as needed for rhinitis. ), Disp: 30 mL, Rfl: 1 .  Misc. Devices MISC, Patient to be out of work until next appointment follow in 8 weeks., Disp: , Rfl:  .  naproxen sodium (ALEVE) 220 MG tablet, Take 220 mg by mouth daily as needed (pain)., Disp: , Rfl:  .  nitroGLYCERIN (NITROSTAT) 0.4 MG SL tablet, Place 1 tablet (0.4 mg total) under the tongue every 5 (five) minutes as needed., Disp: 30 tablet, Rfl: 0 .  OLANZapine (ZYPREXA) 5 MG  tablet, Take 5 mg by mouth at bedtime. Patient takes 2.5 mg. One tablet daily., Disp: , Rfl:  .  Olopatadine HCl (PATADAY) 0.2 % SOLN, Apply 1 drop daily as needed to eye (for itching)., Disp: 2.5 mL, Rfl: 9 .  omeprazole (PRILOSEC) 40 MG capsule, Take 40 mg by mouth daily., Disp: , Rfl:  .  polyethylene glycol (MIRALAX / GLYCOLAX) packet, Take 17 g by mouth daily., Disp: , Rfl:  .  sodium chloride (OCEAN) 0.65 % SOLN nasal spray, Place 1 spray into both nostrils as needed for congestion., Disp: 480 mL, Rfl: 0 .  valACYclovir (VALTREX) 1000 MG tablet, Take 1 tablet (1,000 mg total) by mouth daily. For suppression, Disp: 90 tablet, Rfl: 2 .  valsartan (DIOVAN) 320 MG tablet, Take 1 tablet (320 mg total) by mouth daily., Disp: 90 tablet, Rfl: 3 .  atorvastatin (LIPITOR) 40 MG tablet, Take 1 tablet (40 mg total) by mouth daily., Disp: 90 tablet, Rfl: 3 .  cephALEXin (KEFLEX) 500 MG capsule, Take 1 capsule (500 mg total) by mouth 4 (four) times daily for 3 days. For use AFTER Surgery, Disp: 12 capsule, Rfl: 0 .  HYDROcodone-acetaminophen (NORCO) 5-325 MG tablet, Take 1 tablet by mouth every 8 (eight) hours as needed for up to 7 days for severe pain. For use AFTER Surgery, Disp: 21 tablet, Rfl: 0 .  ondansetron (ZOFRAN) 4 MG tablet, Take 1 tablet (4 mg total) by mouth every 8 (eight) hours as needed for nausea or vomiting., Disp: 20 tablet, Rfl: 0  Past Medical Problems: Past Medical History:  Diagnosis Date  . Allergy   . Anemia   . Anxiety   . Arthritis   . Asthma   . Back pain   . Blood transfusion without reported diagnosis   . Cancer of sigmoid colon ([place])   . Constipation   . Depression   . Diabetes mellitus without complication ([place])   . Dyspnea    with exertion  . Fatigue   . Fatty liver    mild  . GERD (gastroesophageal reflux disease)   . Grade I diastolic dysfunction    Echo 08/08/2017 with Grade I DD, EF 51%, Mild Aortic regurgitation, Mild Tricuspid Regurgitation, Pulmonary  pressure 30 mm Hg. She denies any chest pain, dyspnea, or peripheral edema.  . Headaches due to old head injury   . History of stomach ulcers   . Hyperlipidemia   . Hypertension   . Lactose intolerance   . Left hip pain   . Left thyroid nodule   . Lipoma of extremity 10/17/2017   Left lower  . Migraines   . Obesity   . TBI (traumatic brain injury) ([place])   . Thyroid disease   . Vitamin D deficiency     Past Surgical History: Past Surgical History:  Procedure Laterality Date  . ABDOMINAL HYSTERECTOMY    . BREAST LUMPECTOMY WITH RADIOACTIVE SEED LOCALIZATION Left 08/23/2016   Procedure: LEFT BREAST LUMPECTOMY WITH RADIOACTIVE SEED LOCALIZATION;  Surgeon: Rolm Bookbinder, MD;  Location: [facility];  Service: General;  Laterality: Left;  . BREAST SURGERY  2003   reduction  . CESAREAN SECTION    . CHALAZION EXCISION Left 2010  . COLONOSCOPY W/ POLYPECTOMY  02/16/2018   Dr. Ardis Hughs  . FACET JOINT INJECTION  2011 and 2012  . FLEXIBLE SIGMOIDOSCOPY N/A 04/18/2018   Procedure: FLEXIBLE SIGMOIDOSCOPY;  Surgeon: Ileana Roup, MD;  Location: [facility];  Service: General;  Laterality: N/A;  . LAPAROSCOPIC SIGMOID COLECTOMY N/A 04/18/2018   Procedure: LAPAROSCOPIC ASSISTED SIGMOIDECTOMY;  Surgeon: Ileana Roup, MD;  Location: [facility];  Service: General;  Laterality: N/A;  . MYOMECTOMY    . NM MYOCAR PERF WALL MOTION  03/2009   dipyridamole; normal pattern of perfusion in all regions, post-stress EF 65%, normal study   . THYROIDECTOMY  1989   partial  . TRANSTHORACIC ECHOCARDIOGRAM  03/2009   EF=>55%, borderline conc LVH; mild mitral annular calcif, trace MR; trace TR; AV mildly sclerotic, mild AV regurg; mild pulm valve regurg   . TUBAL LIGATION      Social History: Social History   Socioeconomic History  . Marital status: Married    Spouse name: Carloyn Manner  . Number of children: 2  . Years of education: Not on file  . Highest education level: Not on file    Occupational History  . Occupation: retired    Fish farm manager: [employer]  Tobacco Use  . Smoking status: Never Smoker  . Smokeless tobacco: Never Used  Vaping Use  . Vaping Use: Never used  Substance and Sexual Activity  . Alcohol use: Yes    Comment: occ. beer  . Drug use: No  . Sexual activity: Not on file  Other Topics Concern  . Not on file  Social History Narrative  . Not on file   Social Determinants of Health   Financial Resource Strain:   . Difficulty of Paying Living Expenses: Not on file  Food Insecurity:   . Worried About Charity fundraiser in the Last Year: Not on file  . Ran Out of Food in the Last Year: Not on file  Transportation Needs:   . Lack of Transportation (Medical): Not on file  . Lack of Transportation (Non-Medical): Not on file  Physical Activity:   . Days of Exercise per Week: Not on file  . Minutes of Exercise per Session: Not on file  Stress:   . Feeling of Stress : Not on file  Social Connections:   . Frequency of Communication with Friends and Family: Not on file  . Frequency of Social Gatherings with Friends and Family: Not on file  . Attends Religious Services: Not on file  . Active Member of Clubs or Organizations: Not on file  . Attends Archivist Meetings: Not on file  . Marital Status: Not on file  Intimate Partner Violence:   . Fear of Current or Ex-Partner: Not on file  . Emotionally Abused: Not on file  . Physically Abused: Not on file  . Sexually Abused: Not on file    Family History: Family History  Problem Relation Age of Onset  . Heart disease Mother        MI, HTN  . Hypertension Mother   . Thyroid disease Mother   .  Hyperlipidemia Mother   . Sudden death Mother   . Anxiety disorder Mother   . Heart disease Maternal Grandmother        MI, CVA  . Heart disease Maternal Grandfather        MI  . Stroke Maternal Grandfather   . Heart disease Paternal Grandmother   . Stroke Paternal Grandmother    . Heart disease Paternal Grandfather        MI  . Glaucoma Father   . Diabetes Father   . Depression Daughter   . ADD / ADHD Son   . Thyroid disease Sister   . Heart attack Paternal Uncle        HTN  . Hypertension Paternal Uncle   . Colon cancer Neg Hx   . Stomach cancer Neg Hx   . Esophageal cancer Neg Hx   . Rectal cancer Neg Hx     Review of Systems: Review of Systems  Constitutional: Negative for chills and fever.  HENT: Negative for congestion and sore throat.   Respiratory: Negative for cough and shortness of breath.   Cardiovascular: Negative for chest pain and palpitations.  Gastrointestinal: Negative for abdominal pain, nausea and vomiting.  Musculoskeletal: Positive for back pain. Negative for joint pain and myalgias.  Skin: Negative for itching and rash.    Physical Exam: Vital Signs BP (!) 155/81 (BP Location: Left Arm, Patient Position: Sitting, Cuff Size: Large)   Pulse 66   Temp 98.4 F (36.9 C) (Oral)   Ht 5\' 3"  (1.6 m)   Wt 211 lb (95.7 kg)   SpO2 97%   BMI 37.38 kg/m  Physical Exam Constitutional:      General: She is not in acute distress.    Appearance: Normal appearance. She is obese. She is not ill-appearing.  HENT:     Head: Normocephalic and atraumatic.  Eyes:     Extraocular Movements: Extraocular movements intact.  Cardiovascular:     Rate and Rhythm: Normal rate and regular rhythm.     Pulses: Normal pulses.     Heart sounds: Normal heart sounds.  Pulmonary:     Effort: Pulmonary effort is normal.     Breath sounds: Normal breath sounds. No wheezing, rhonchi or rales.  Abdominal:     General: Bowel sounds are normal.     Palpations: Abdomen is soft.  Musculoskeletal:        General: No swelling. Normal range of motion.     Cervical back: Normal range of motion.  Skin:    General: Skin is warm and dry.     Coloration: Skin is not pale.     Findings: No erythema or rash.  Neurological:     General: No focal deficit present.      Mental Status: She is alert and oriented to person, place, and time.  Psychiatric:        Mood and Affect: Mood normal.        Behavior: Behavior normal.        Thought Content: Thought content normal.        Judgment: Judgment normal.     Assessment/Plan:  Ms. Kuehl scheduled for panniculectomy with possible liposuction with Dr. Marla Roe.  Risks, benefits, and alternatives of procedure discussed, questions answered and consent obtained.    Smoking Status: non-smoker; Counseling Given? N/A  Surgical Clearance Requests sent to Dr. Debara Pickett - [facility]; Dr. Lorelei Pont - PCP  Caprini Score: 7 High; Risk Factors include: 69 year old female, Hx sigmoid colon cancer,  BMI > 25, and length of planned surgery. Recommendation for mechanical and pharmacological prophylaxis during surgery. Encourage early ambulation.   Pictures obtained: 06/05/20  Post-op Rx sent to pharmacy: Norco, Zofran, Keflex  Patient was provided with the General Surgical Risk consent document and Pain Medication Agreement prior to their appointment.  They had adequate time to read through the risk consent documents and Pain Medication Agreement. We also discussed them in person together during this preop appointment. All of their questions were answered to their satisfaction.  Recommended calling if they have any further questions.  Risk consent form and Pain Medication Agreement to be scanned into patient's chart.  The risk that can be encountered for this procedure were discussed and include the following but not limited to these: asymmetry, fluid accumulation, firmness of the tissue, skin loss, decrease or no sensation, fat necrosis, bleeding, infection, healing delay.  Deep vein thrombosis, cardiac and pulmonary complications are risks to any procedure.  There are risks of anesthesia, changes to skin sensation and injury to nerves or blood vessels.  The muscle can be temporarily or permanently injured.  You may have an  allergic reaction to tape, suture, glue, blood products which can result in skin discoloration, swelling, pain, skin lesions, poor healing.  Any of these can lead to the need for revisonal surgery or stage procedures.  Weight gain and weigh loss can also effect the long term appearance. The results are not guaranteed to last a lifetime.  Future surgery may be required.    The [place] was signed into law in 2016 which includes the topic of electronic health records.  This provides immediate access to information in MyChart.  This includes consultation notes, operative notes, office notes, lab results and pathology reports.  If you have any questions about what you read please let us know at your next visit or call us at the office.  We are right here with you.   Electronically signed by: Threasa Heads, PA-C 08/11/2020 12:31 PM

## 2020-08-11 ENCOUNTER — Encounter: Payer: Self-pay | Admitting: Plastic Surgery

## 2020-08-11 ENCOUNTER — Other Ambulatory Visit: Payer: Self-pay

## 2020-08-11 ENCOUNTER — Ambulatory Visit (INDEPENDENT_AMBULATORY_CARE_PROVIDER_SITE_OTHER): Payer: BC Managed Care – PPO | Admitting: Plastic Surgery

## 2020-08-11 VITALS — BP 155/81 | HR 66 | Temp 98.4°F | Ht 63.0 in | Wt 211.0 lb

## 2020-08-11 DIAGNOSIS — E65 Localized adiposity: Secondary | ICD-10-CM

## 2020-08-11 MED ORDER — ONDANSETRON HCL 4 MG PO TABS: 4.0000 mg | ORAL_TABLET | Freq: Three times a day (TID) | ORAL | 0 refills | Status: DC | PRN

## 2020-08-11 MED ORDER — CEPHALEXIN 500 MG PO CAPS: 500.0000 mg | ORAL_CAPSULE | Freq: Four times a day (QID) | ORAL | 0 refills | Status: AC

## 2020-08-11 MED ORDER — HYDROCODONE-ACETAMINOPHEN 5-325 MG PO TABS: 1.0000 | ORAL_TABLET | Freq: Three times a day (TID) | ORAL | 0 refills | Status: AC | PRN

## 2020-08-12 ENCOUNTER — Encounter: Payer: Self-pay | Admitting: Family Medicine

## 2020-08-17 ENCOUNTER — Telehealth: Payer: Self-pay | Admitting: Internal Medicine

## 2020-08-17 NOTE — Telephone Encounter (Signed)
   Primary Cardiologist: Pixie Casino, MD  Chart reviewed as part of pre-operative protocol coverage. Patient was contacted 08/17/2020 in reference to pre-operative risk assessment for pending surgery as outlined below.  [name] was last seen on 06/24/20 by Dr. Debara Pickett. History outlined in chart with family history of coronary disease, HLD, colon CA, headaches, anxiety, anemia, asthma, arthritis, depression, DM, fatty liver, GERD, stomach ulcers, obesity, TBI. PMH outlines h/o echo 08/08/2017 with Grade I DD, EF 51%, Mild Aortic regurgitation, Mild Tricuspid Regurgitation, Pulmonary pressure 30 mm Hg (report not available in Epic or CareEverywhere). Nuclear stress test in 2015 was normal/low risk with normal LVEF. Revised cardiac risk index is 0.4% indicating low risk of CV complications based on current history. She affirms she is doing well without any new cardiac symptoms, walking several times a week, without chest pain or dyspnea. Therefore, based on ACC/AHA guidelines, the patient would be at acceptable risk for the planned procedure without further cardiovascular testing. The patient was advised that if she develops new symptoms prior to surgery to contact our office to arrange for a follow-up visit, and she verbalized understanding. She also plans to see her PCP for general medical clearance.  I will route this recommendation to the requesting party via Epic fax function and remove from pre-op pool. Please call with questions.  Charlie Pitter, PA-C 08/17/2020, 4:15 PM

## 2020-08-17 NOTE — Telephone Encounter (Signed)
   [facility] Pre-operative Risk Assessment    Request for surgical clearance:  1. What type of surgery is being performed? Panniculectomy    2. When is this surgery scheduled? 09/02/2020   3. What type of clearance is required (medical clearance vs. Pharmacy clearance to hold med vs. Both)? Medical  4. Are there any medications that need to be held prior to surgery and how long? None   5. Practice name and name of physician performing surgery? Dr. Audelia Hives with [facility]    6. What is the office phone number? [phone]   7.   What is the office fax number? [phone]  8.   Anesthesia type (None, local, MAC, general) ? General    Sheral Apley M 08/17/2020, 4:12 PM  _________________________________________________________________   (provider comments below)

## 2020-08-17 NOTE — Telephone Encounter (Signed)
I spoke with Mrs Sardo and she is getting frustrated because the surgery date is approaching and she needs to know if she needs to see Dr Debara Pickett. I asked her to have them fax a clearance form here but she reports that they "already did"!  A call was placed to Dr Dillingham's office to verify if Dr Lysbeth Penner needs to provide medical clearance.  They report that they faxed a form on 08/11/20 but it was not received.  They will refax to [phone].

## 2020-08-17 NOTE — Telephone Encounter (Signed)
Patient states that she called last Wednesday and never got a call back in regards to her having an upcoming procedure on 09/02/20. She states she called asking if she needed an appt with Dr. Debara Pickett. Please advise.

## 2020-08-21 NOTE — Progress Notes (Addendum)
[facility] [address], [place], [place] [address] [phone]  Date:  08/24/2020   Name:  [name]   DOB:  1951-10-21   MRN:  [id number]  PCP:  Darreld Mclean, MD    Chief Complaint: Annual Exam and lump in Throat (nodule getting bigger in throat, increase in size, normal [place] years ago)   History of Present Illness:  [name] is a 69 y.o. very pleasant female patient who presents with the following:  Pt here today for a pre-operative evaluation- history of hypertension, migraine headache, colon cancer status post sigmoid colectomy 2019, obesity, dyslipidemia, prediabetes, right lobe thyroidectomy 1992 Last seen by myself in April of this year  We have noted some enlargement of her thyroid gland again recently-she is status post right lobe thyroidectomy, her dentist noticed an enlargement of her left neck and brought to her attention She is not currently taking thyroid medication   She plans to have a panniculectomy per Dr Marla Roe early October  EKG per cardiology done in July - Dr Debara Pickett saw her for follow-up ASSESSMENT: 1. Dyslipidemia 2. Hypertension 3. Obesity - enrolled in [facility]  4. History of TBI with headaches and memory loss 5. Strong family history of premature coronary artery disease 6. Mild type 2 diabetes 7. Recent colon cancer status post resection PLAN: 1.   [name] seems to have better controlled hypertension today.  She has managed to lose some weight as well.  I encouraged her to continue to work on that.  Cholesterol is very near goal.  A1c is well controlled. Follow-up annually or sooner as necessary.  Colon cancer screening; pt has checked on this and she is due next year  Flu vaccine- she wishes to do after her surgery I also encouraged her to get a COVID-19 booster Lab Results  Component Value Date   HGBA1C 5.7  03/25/2020   Reviewed chart- pt has been through cardiology pre-op protocol and cleared for surgery without further testing 08/17/20   Patient Active Problem List   Diagnosis Date Noted  . Neck pain 12/20/2019  . Colon cancer ([place]) 04/18/2018  . Class 2 obesity with serious comorbidity and body mass index (BMI) of 38.0 to 38.9 in adult 07/27/2017  . Vitamin D deficiency 07/27/2017  . Other fatigue 06/29/2017  . Shortness of breath on exertion 06/29/2017  . Class 2 obesity with serious comorbidity and body mass index (BMI) of 39.0 to 39.9 in adult 06/29/2017  . Left hip pain 04/18/2017  . Pre-diabetes 02/05/2016  . Left thyroid nodule 11/10/2014  . TBI (traumatic brain injury) ([place]) 07/15/2014  . Chest pain 12/13/2013  . Dyslipidemia 12/13/2013  . Obesity (BMI 35.0-39.9 without comorbidity) 12/13/2013  . Hypertension 01/18/2012  . GERD (gastroesophageal reflux disease) 01/18/2012  . Depression 01/18/2012  . Migraine syndrome 01/18/2012    Past Medical History:  Diagnosis Date  . Allergy   . Anemia   . Anxiety   . Arthritis   . Asthma   . Back pain   . Blood transfusion without reported diagnosis   . Cancer of sigmoid colon ([place])   . Constipation   . Depression   . Diabetes mellitus without complication ([place])   . Dyspnea    with exertion  . Fatigue   . Fatty liver    mild  . GERD (gastroesophageal reflux disease)   . Grade I diastolic dysfunction  Echo 08/08/2017 with Grade I DD, EF 51%, Mild Aortic regurgitation, Mild Tricuspid Regurgitation, Pulmonary pressure 30 mm Hg. She denies any chest pain, dyspnea, or peripheral edema.  . Headaches due to old head injury   . History of stomach ulcers   . Hyperlipidemia   . Hypertension   . Lactose intolerance   . Left hip pain   . Left thyroid nodule   . Lipoma of extremity 10/17/2017   Left lower  . Migraines   . Obesity   . TBI (traumatic brain injury) ([place])   . Thyroid disease   . Vitamin D deficiency      Past Surgical History:  Procedure Laterality Date  . ABDOMINAL HYSTERECTOMY    . BREAST LUMPECTOMY WITH RADIOACTIVE SEED LOCALIZATION Left 08/23/2016   Procedure: LEFT BREAST LUMPECTOMY WITH RADIOACTIVE SEED LOCALIZATION;  Surgeon: Rolm Bookbinder, MD;  Location: [facility];  Service: General;  Laterality: Left;  . BREAST SURGERY  2003   reduction  . CESAREAN SECTION    . CHALAZION EXCISION Left 2010  . COLONOSCOPY W/ POLYPECTOMY  02/16/2018   Dr. Ardis Hughs  . FACET JOINT INJECTION  2011 and 2012  . FLEXIBLE SIGMOIDOSCOPY N/A 04/18/2018   Procedure: FLEXIBLE SIGMOIDOSCOPY;  Surgeon: Ileana Roup, MD;  Location: [facility];  Service: General;  Laterality: N/A;  . LAPAROSCOPIC SIGMOID COLECTOMY N/A 04/18/2018   Procedure: LAPAROSCOPIC ASSISTED SIGMOIDECTOMY;  Surgeon: Ileana Roup, MD;  Location: [facility];  Service: General;  Laterality: N/A;  . MYOMECTOMY    . NM MYOCAR PERF WALL MOTION  03/2009   dipyridamole; normal pattern of perfusion in all regions, post-stress EF 65%, normal study   . THYROIDECTOMY  1989   partial  . TRANSTHORACIC ECHOCARDIOGRAM  03/2009   EF=>55%, borderline conc LVH; mild mitral annular calcif, trace MR; trace TR; AV mildly sclerotic, mild AV regurg; mild pulm valve regurg   . TUBAL LIGATION      Social History   Tobacco Use  . Smoking status: Never Smoker  . Smokeless tobacco: Never Used  Vaping Use  . Vaping Use: Never used  Substance Use Topics  . Alcohol use: Yes    Comment: occ. beer  . Drug use: No    Family History  Problem Relation Age of Onset  . Heart disease Mother        MI, HTN  . Hypertension Mother   . Thyroid disease Mother   . Hyperlipidemia Mother   . Sudden death Mother   . Anxiety disorder Mother   . Heart disease Maternal Grandmother        MI, CVA  . Heart disease Maternal Grandfather        MI  . Stroke Maternal Grandfather   . Heart disease Paternal Grandmother   . Stroke Paternal  Grandmother   . Heart disease Paternal Grandfather        MI  . Glaucoma Father   . Diabetes Father   . Depression Daughter   . ADD / ADHD Son   . Thyroid disease Sister   . Heart attack Paternal Uncle        HTN  . Hypertension Paternal Uncle   . Colon cancer Neg Hx   . Stomach cancer Neg Hx   . Esophageal cancer Neg Hx   . Rectal cancer Neg Hx     Allergies  Allergen Reactions  . Hydrochlorothiazide Other (See Comments)    Increases risk of gout flares   . Botox [Botulinum  Toxin Type A] Swelling  . Onabotulinumtoxina Swelling  . Toprol Xl [Metoprolol Succinate] Hives    Medication list has been reviewed and updated.  Current Outpatient Medications on File Prior to Visit  Medication Sig Dispense Refill  . amLODipine (NORVASC) 10 MG tablet Take 1 tablet (10 mg total) by mouth daily. 30 tablet 5  . atorvastatin (LIPITOR) 40 MG tablet Take 1 tablet (40 mg total) by mouth daily. 90 tablet 3  . butalbital-acetaminophen-caffeine (FIORICET) 50-325-40 MG tablet 1-2 po q 6 hrs prn-max 3/day    . carvedilol (COREG) 6.25 MG tablet Take 1 tablet (6.25 mg total) by mouth 2 (two) times daily with a meal. TAKE 1 TABLET(3.125 MG) BY MOUTH TWICE DAILY 180 tablet 3  . cetirizine (ZYRTEC) 10 MG tablet Take 10 mg by mouth daily as needed for allergies.    Marland Kitchen desvenlafaxine (PRISTIQ) 100 MG 24 hr tablet Take 100 mg by mouth daily.    . diclofenac (FLECTOR) 1.3 % PTCH Place 1 patch onto the skin 2 (two) times daily as needed (pain).     . Diclofenac Potassium (CAMBIA) 50 MG PACK Take 50 mg by mouth daily as needed (migraines).     . diclofenac sodium (VOLTAREN) 1 % GEL Apply 2 g topically 4 (four) times daily. (Patient taking differently: Apply 2 g topically daily as needed (back pain). ) 100 g 3  . fluconazole (DIFLUCAN) 150 MG tablet Take 1 tablet (150 mg total) by mouth every three (3) days as needed. 3 tablet 0  . Fluocinolone Acetonide Body (DERMA-SMOOTHE/FS BODY) 0.01 % OIL Apply 1  application topically daily as needed. Leave on for 4 hours or overnight, then shampoo scalp 118.28 mL 2  . fluticasone (FLONASE) 50 MCG/ACT nasal spray Place 2 sprays into both nostrils daily. 16 g 0  . frovatriptan (FROVA) 2.5 MG tablet Take 2.5 mg by mouth as needed for migraine. If recurs, may repeat after 2 hours. Max of 3 tabs in 24 hours.    . gabapentin (NEURONTIN) 300 MG capsule Take 300 mg by mouth 3 (three) times daily as needed (headaches).     Marland Kitchen ibuprofen (ADVIL,MOTRIN) 200 MG tablet Take 200 mg by mouth 3 (three) times daily as needed for headache or moderate pain.    Marland Kitchen ipratropium (ATROVENT) 0.03 % nasal spray Place 2 sprays into the nose 4 (four) times daily. (Patient taking differently: Place 2 sprays into the nose 4 (four) times daily as needed for rhinitis. ) 30 mL 1  . Misc. Devices MISC Patient to be out of work until next appointment follow in 8 weeks.    . naproxen sodium (ALEVE) 220 MG tablet Take 220 mg by mouth daily as needed (pain).    . nitroGLYCERIN (NITROSTAT) 0.4 MG SL tablet Place 1 tablet (0.4 mg total) under the tongue every 5 (five) minutes as needed. 30 tablet 0  . OLANZapine (ZYPREXA) 5 MG tablet Take 5 mg by mouth at bedtime. Patient takes 2.5 mg. One tablet daily.    . Olopatadine HCl (PATADAY) 0.2 % SOLN Apply 1 drop daily as needed to eye (for itching). 2.5 mL 9  . omeprazole (PRILOSEC) 40 MG capsule Take 40 mg by mouth daily.    . ondansetron (ZOFRAN) 4 MG tablet Take 1 tablet (4 mg total) by mouth every 8 (eight) hours as needed for nausea or vomiting. 20 tablet 0  . polyethylene glycol (MIRALAX / GLYCOLAX) packet Take 17 g by mouth daily.    . sodium chloride (  OCEAN) 0.65 % SOLN nasal spray Place 1 spray into both nostrils as needed for congestion. 480 mL 0  . valACYclovir (VALTREX) 1000 MG tablet Take 1 tablet (1,000 mg total) by mouth daily. For suppression 90 tablet 2  . valsartan (DIOVAN) 320 MG tablet Take 1 tablet (320 mg total) by mouth daily. 90  tablet 3   No current facility-administered medications on file prior to visit.    Review of Systems:  As per HPI- otherwise negative.   Physical Examination: Vitals:   08/24/20 1006  BP: 132/80  Pulse: 67  Resp: 16  SpO2: 97%   Vitals:   08/24/20 1006  Weight: 211 lb (95.7 kg)  Height: 5\' 3"  (1.6 m)   Body mass index is 37.38 kg/m. Ideal Body Weight: Weight in (lb) to have BMI = 25: 140.8  GEN: no acute distress.  Obese, otherwise looks well HEENT: Atraumatic, Normocephalic.  The patient has fullness in the left neck, consistent with enlargement of the left lobe of thyroid Ears and Nose: No external deformity. CV: RRR, No M/G/R. No JVD. No thrill. No extra heart sounds. PULM: CTA B, no wheezes, crackles, rhonchi. No retractions. No resp. distress. No accessory muscle use. ABD: S, NT, ND, +BS. No rebound. No HSM. EXTR: No c/c/e PSYCH: Normally interactive. Conversant.    Assessment and Plan: Pre-operative clearance - Plan: CBC, Comprehensive metabolic panel  Pannus, abdominal  Thyroid enlargement - Plan: TSH, T3, free, US THYROID  Screening for diabetes mellitus - Plan: Hemoglobin A1c  Patient today for preoperative clearance.  I have her form ready, as soon as her labs come back will complete and fax to Dr. Marla Roe Thyroid enlargement noted.  We will check thyroid labs and set up ultrasound A1c due today Encouraged flu and Covid boosters this fall Will plan further follow- up pending labs.  This visit occurred during the SARS-CoV-2 public health emergency.  Safety protocols were in place, including screening questions prior to the visit, additional usage of staff PPE, and extensive cleaning of exam room while observing appropriate contact time as indicated for disinfecting solutions.     Signed Lamar Blinks, MD  Addendum 9/28, received her labs as below Message to patient, will complete preoperative form and fax to her surgeon  Results for orders  placed or performed in visit on 08/24/20  CBC  Result Value Ref Range   WBC 4.3 3.8 - 10.8 Thousand/uL   RBC 4.54 3.80 - 5.10 Million/uL   Hemoglobin 13.6 11.7 - 15.5 g/dL   HCT 40.6 35 - 45 %   MCV 89.4 80.0 - 100.0 fL   MCH 30.0 27.0 - 33.0 pg   MCHC 33.5 32.0 - 36.0 g/dL   RDW 12.9 11.0 - 15.0 %   Platelets 259 140 - 400 Thousand/uL   MPV 10.6 7.5 - 12.5 fL  Comprehensive metabolic panel  Result Value Ref Range   Glucose, Bld 107 (H) 65 - 99 mg/dL   BUN 12 7 - 25 mg/dL   Creat 0.72 0.50 - 0.99 mg/dL   BUN/Creatinine Ratio NOT APPLICABLE 6 - 22 (calc)   Sodium 141 135 - 146 mmol/L   Potassium 3.7 3.5 - 5.3 mmol/L   Chloride 103 98 - 110 mmol/L   CO2 32 20 - 32 mmol/L   Calcium 9.4 8.6 - 10.4 mg/dL   Total Protein 6.3 6.1 - 8.1 g/dL   Albumin 3.8 3.6 - 5.1 g/dL   Globulin 2.5 1.9 - 3.7 g/dL (calc)  AG Ratio 1.5 1.0 - 2.5 (calc)   Total Bilirubin 0.4 0.2 - 1.2 mg/dL   Alkaline phosphatase (APISO) 71 37 - 153 U/L   AST 16 10 - 35 U/L   ALT 17 6 - 29 U/L  Hemoglobin A1c  Result Value Ref Range   Hgb A1c MFr Bld 5.7 (H) <5.7 % of total Hgb   Mean Plasma Glucose 117 (calc)   eAG (mmol/L) 6.5 (calc)  TSH  Result Value Ref Range   TSH 0.36 (L) 0.40 - 4.50 mIU/L  T3, free  Result Value Ref Range   T3, Free 3.2 2.3 - 4.2 pg/mL   Blood counts are normal Metabolic profile looks fine Your A1c is barely in the prediabetes range, stable  Your TSH is minimally low-this means your thyroid gland may be becoming overactive.  Your actual T3 level (which measures the thyroid hormone directly) is okay.  We will plan to get an ultrasound of your thyroid to look into this further. I do not think we need to hold up your surgery for this issue, but we will keep working on it.  Please let me know if you do not get an appointment for your ultrasound in the next week or so  We can plan for routine visit in 6 months

## 2020-08-24 ENCOUNTER — Other Ambulatory Visit: Payer: Self-pay

## 2020-08-24 ENCOUNTER — Ambulatory Visit: Payer: BC Managed Care – PPO | Admitting: Family Medicine

## 2020-08-24 ENCOUNTER — Encounter: Payer: Self-pay | Admitting: Family Medicine

## 2020-08-24 VITALS — BP 132/80 | HR 67 | Resp 16 | Ht 63.0 in | Wt 211.0 lb

## 2020-08-24 DIAGNOSIS — Z131 Encounter for screening for diabetes mellitus: Secondary | ICD-10-CM | POA: Diagnosis not present

## 2020-08-24 DIAGNOSIS — Z01818 Encounter for other preprocedural examination: Secondary | ICD-10-CM | POA: Diagnosis not present

## 2020-08-24 DIAGNOSIS — E049 Nontoxic goiter, unspecified: Secondary | ICD-10-CM | POA: Diagnosis not present

## 2020-08-24 DIAGNOSIS — E65 Localized adiposity: Secondary | ICD-10-CM | POA: Diagnosis not present

## 2020-08-24 NOTE — Patient Instructions (Signed)
It was great to see you again today, I hope all goes very smoothly with your upcoming operation.  Please plan to see me in about 6 months assuming all is well  Please plan for flu shot, COVID-19 booster at your convenience  As soon as I get your labs back I will send you the results, and complete the note for Dr. Marla Roe as well  We ordered a thyroid ultrasound for you today, please complete this as soon as possible

## 2020-08-25 ENCOUNTER — Encounter: Payer: Self-pay | Admitting: Family Medicine

## 2020-08-25 LAB — COMPREHENSIVE METABOLIC PANEL
AG Ratio: 1.5 (calc) (ref 1.0–2.5)
ALT: 17 U/L (ref 6–29)
AST: 16 U/L (ref 10–35)
Albumin: 3.8 g/dL (ref 3.6–5.1)
Alkaline phosphatase (APISO): 71 U/L (ref 37–153)
BUN: 12 mg/dL (ref 7–25)
CO2: 32 mmol/L (ref 20–32)
Calcium: 9.4 mg/dL (ref 8.6–10.4)
Chloride: 103 mmol/L (ref 98–110)
Creat: 0.72 mg/dL (ref 0.50–0.99)
Globulin: 2.5 g/dL (calc) (ref 1.9–3.7)
Glucose, Bld: 107 mg/dL — ABNORMAL HIGH (ref 65–99)
Potassium: 3.7 mmol/L (ref 3.5–5.3)
Sodium: 141 mmol/L (ref 135–146)
Total Bilirubin: 0.4 mg/dL (ref 0.2–1.2)
Total Protein: 6.3 g/dL (ref 6.1–8.1)

## 2020-08-25 LAB — HEMOGLOBIN A1C
Hgb A1c MFr Bld: 5.7 % of total Hgb — ABNORMAL HIGH (ref <5.7)
Mean Plasma Glucose: 117 (calc)
eAG (mmol/L): 6.5 (calc)

## 2020-08-25 LAB — T3, FREE: T3, Free: 3.2 pg/mL (ref 2.3–4.2)

## 2020-08-25 LAB — CBC
HCT: 40.6 % (ref 35.0–45.0)
Hemoglobin: 13.6 g/dL (ref 11.7–15.5)
MCH: 30 pg (ref 27.0–33.0)
MCHC: 33.5 g/dL (ref 32.0–36.0)
MCV: 89.4 fL (ref 80.0–100.0)
MPV: 10.6 fL (ref 7.5–12.5)
Platelets: 259 10*3/uL (ref 140–400)
RBC: 4.54 10*6/uL (ref 3.80–5.10)
RDW: 12.9 % (ref 11.0–15.0)
WBC: 4.3 10*3/uL (ref 3.8–10.8)

## 2020-08-25 LAB — TSH: TSH: 0.36 mIU/L — ABNORMAL LOW (ref 0.40–4.50)

## 2020-08-26 ENCOUNTER — Encounter: Payer: Self-pay | Admitting: Plastic Surgery

## 2020-08-26 NOTE — Progress Notes (Signed)
Surgical Clearance (medical)  has been received from Dr. Lorelei Pont, PCP for patient's upcoming surgery Panniculectomy with Dr. Marla Roe.  Surgical Clearance (Cardiac) - Telephone note 08/17/20 by Melina Copa, PA-C [facility] states that 'based on ACC/AHA guidelines, the patient would be at acceptable risk for the planned procedure without further cardiovascular testing'

## 2020-08-28 ENCOUNTER — Encounter (HOSPITAL_BASED_OUTPATIENT_CLINIC_OR_DEPARTMENT_OTHER): Payer: Self-pay | Admitting: Plastic Surgery

## 2020-08-28 ENCOUNTER — Other Ambulatory Visit: Payer: Self-pay

## 2020-08-29 ENCOUNTER — Other Ambulatory Visit (HOSPITAL_COMMUNITY)
Admission: RE | Admit: 2020-08-29 | Discharge: 2020-08-29 | Disposition: A | Discharge status: Home / Self Care | Payer: BC Managed Care – PPO | Source: Ambulatory Visit | Attending: Plastic Surgery | Admitting: Plastic Surgery

## 2020-08-29 DIAGNOSIS — Z01812 Encounter for preprocedural laboratory examination: Secondary | ICD-10-CM | POA: Diagnosis present

## 2020-08-29 DIAGNOSIS — Z20822 Contact with and (suspected) exposure to covid-19: Secondary | ICD-10-CM | POA: Diagnosis not present

## 2020-08-29 LAB — SARS CORONAVIRUS 2 (TAT 6-24 HRS): SARS Coronavirus 2: NEGATIVE

## 2020-08-31 ENCOUNTER — Other Ambulatory Visit (HOSPITAL_BASED_OUTPATIENT_CLINIC_OR_DEPARTMENT_OTHER): Payer: BC Managed Care – PPO

## 2020-09-02 ENCOUNTER — Encounter (HOSPITAL_BASED_OUTPATIENT_CLINIC_OR_DEPARTMENT_OTHER)
Admission: RE | Disposition: A | Discharge status: Home / Self Care | Payer: Self-pay | Source: Home / Self Care | Attending: Plastic Surgery

## 2020-09-02 ENCOUNTER — Ambulatory Visit (HOSPITAL_BASED_OUTPATIENT_CLINIC_OR_DEPARTMENT_OTHER): Payer: BC Managed Care – PPO | Admitting: Anesthesiology

## 2020-09-02 ENCOUNTER — Ambulatory Visit (HOSPITAL_BASED_OUTPATIENT_CLINIC_OR_DEPARTMENT_OTHER)
Admission: RE | Admit: 2020-09-02 | Discharge: 2020-09-02 | Disposition: A | Discharge status: Home / Self Care | Payer: BC Managed Care – PPO | Attending: Plastic Surgery | Admitting: Plastic Surgery

## 2020-09-02 ENCOUNTER — Other Ambulatory Visit: Payer: Self-pay

## 2020-09-02 ENCOUNTER — Encounter (HOSPITAL_BASED_OUTPATIENT_CLINIC_OR_DEPARTMENT_OTHER): Payer: Self-pay | Admitting: Plastic Surgery

## 2020-09-02 DIAGNOSIS — E119 Type 2 diabetes mellitus without complications: Secondary | ICD-10-CM | POA: Insufficient documentation

## 2020-09-02 DIAGNOSIS — E669 Obesity, unspecified: Secondary | ICD-10-CM | POA: Insufficient documentation

## 2020-09-02 DIAGNOSIS — M793 Panniculitis, unspecified: Secondary | ICD-10-CM

## 2020-09-02 DIAGNOSIS — Z8249 Family history of ischemic heart disease and other diseases of the circulatory system: Secondary | ICD-10-CM | POA: Insufficient documentation

## 2020-09-02 DIAGNOSIS — D649 Anemia, unspecified: Secondary | ICD-10-CM | POA: Insufficient documentation

## 2020-09-02 DIAGNOSIS — Z85038 Personal history of other malignant neoplasm of large intestine: Secondary | ICD-10-CM | POA: Diagnosis not present

## 2020-09-02 DIAGNOSIS — Z87820 Personal history of traumatic brain injury: Secondary | ICD-10-CM | POA: Diagnosis not present

## 2020-09-02 DIAGNOSIS — I10 Essential (primary) hypertension: Secondary | ICD-10-CM | POA: Diagnosis not present

## 2020-09-02 DIAGNOSIS — K760 Fatty (change of) liver, not elsewhere classified: Secondary | ICD-10-CM | POA: Insufficient documentation

## 2020-09-02 DIAGNOSIS — E890 Postprocedural hypothyroidism: Secondary | ICD-10-CM | POA: Diagnosis not present

## 2020-09-02 DIAGNOSIS — Z6837 Body mass index (BMI) 37.0-37.9, adult: Secondary | ICD-10-CM | POA: Diagnosis not present

## 2020-09-02 DIAGNOSIS — Z833 Family history of diabetes mellitus: Secondary | ICD-10-CM | POA: Diagnosis not present

## 2020-09-02 DIAGNOSIS — E785 Hyperlipidemia, unspecified: Secondary | ICD-10-CM | POA: Insufficient documentation

## 2020-09-02 DIAGNOSIS — Z90710 Acquired absence of both cervix and uterus: Secondary | ICD-10-CM | POA: Insufficient documentation

## 2020-09-02 DIAGNOSIS — M1990 Unspecified osteoarthritis, unspecified site: Secondary | ICD-10-CM | POA: Diagnosis not present

## 2020-09-02 DIAGNOSIS — Z888 Allergy status to other drugs, medicaments and biological substances status: Secondary | ICD-10-CM | POA: Diagnosis not present

## 2020-09-02 DIAGNOSIS — Z79899 Other long term (current) drug therapy: Secondary | ICD-10-CM | POA: Diagnosis not present

## 2020-09-02 DIAGNOSIS — Z9049 Acquired absence of other specified parts of digestive tract: Secondary | ICD-10-CM | POA: Insufficient documentation

## 2020-09-02 HISTORY — DX: Panniculitis, unspecified: M79.3

## 2020-09-02 HISTORY — PX: ABDOMINOPLASTY/PANNICULECTOMY WITH LIPOSUCTION: SHX5577

## 2020-09-02 SURGERY — PANNICULECTOMY, ABDOMINAL, WITH LIPOSUCTION
Anesthesia: General | Site: Abdomen

## 2020-09-02 MED ORDER — SUGAMMADEX SODIUM 200 MG/2ML IV SOLN
INTRAVENOUS | Status: DC | PRN
  Administered 2020-09-02: 200 mg via INTRAVENOUS

## 2020-09-02 MED ORDER — SODIUM CHLORIDE 0.9 % IV SOLN: 250.0000 mL | INTRAVENOUS | Status: DC | PRN

## 2020-09-02 MED ORDER — FENTANYL CITRATE (PF) 100 MCG/2ML IJ SOLN
INTRAMUSCULAR | Status: AC
  Filled 2020-09-02: qty 2

## 2020-09-02 MED ORDER — SODIUM CHLORIDE 0.9% FLUSH: 3.0000 mL | Freq: Two times a day (BID) | INTRAVENOUS | Status: DC

## 2020-09-02 MED ORDER — LACTATED RINGERS IV SOLN: INTRAVENOUS | Status: DC

## 2020-09-02 MED ORDER — DIPHENHYDRAMINE HCL 50 MG/ML IJ SOLN
INTRAMUSCULAR | Status: AC
  Filled 2020-09-02: qty 1

## 2020-09-02 MED ORDER — CHLORHEXIDINE GLUCONATE CLOTH 2 % EX PADS: 6.0000 | MEDICATED_PAD | Freq: Once | CUTANEOUS | Status: DC

## 2020-09-02 MED ORDER — ACETAMINOPHEN 325 MG RE SUPP: 650.0000 mg | RECTAL | Status: DC | PRN

## 2020-09-02 MED ORDER — OXYCODONE HCL 5 MG PO TABS: 5.0000 mg | ORAL_TABLET | ORAL | Status: DC | PRN

## 2020-09-02 MED ORDER — AMISULPRIDE (ANTIEMETIC) 5 MG/2ML IV SOLN: 10.0000 mg | Freq: Once | INTRAVENOUS | Status: DC | PRN

## 2020-09-02 MED ORDER — PHENYLEPHRINE 40 MCG/ML (10ML) SYRINGE FOR IV PUSH (FOR BLOOD PRESSURE SUPPORT)
PREFILLED_SYRINGE | INTRAVENOUS | Status: AC
  Filled 2020-09-02: qty 10

## 2020-09-02 MED ORDER — CEFAZOLIN SODIUM-DEXTROSE 2-4 GM/100ML-% IV SOLN
INTRAVENOUS | Status: AC
  Filled 2020-09-02: qty 100

## 2020-09-02 MED ORDER — MORPHINE SULFATE (PF) 4 MG/ML IV SOLN: 2.0000 mg | INTRAVENOUS | Status: DC | PRN

## 2020-09-02 MED ORDER — ACETAMINOPHEN 500 MG PO TABS
ORAL_TABLET | ORAL | Status: AC
  Filled 2020-09-02: qty 2

## 2020-09-02 MED ORDER — ACETAMINOPHEN 325 MG PO TABS: 650.0000 mg | ORAL_TABLET | ORAL | Status: DC | PRN

## 2020-09-02 MED ORDER — ROCURONIUM BROMIDE 100 MG/10ML IV SOLN
INTRAVENOUS | Status: DC | PRN
  Administered 2020-09-02: 50 mg via INTRAVENOUS

## 2020-09-02 MED ORDER — EPHEDRINE SULFATE 50 MG/ML IJ SOLN
INTRAMUSCULAR | Status: DC | PRN
  Administered 2020-09-02: 10 mg via INTRAVENOUS

## 2020-09-02 MED ORDER — DEXAMETHASONE SODIUM PHOSPHATE 4 MG/ML IJ SOLN
INTRAMUSCULAR | Status: DC | PRN
  Administered 2020-09-02: 10 mg via INTRAVENOUS

## 2020-09-02 MED ORDER — LIDOCAINE HCL 1 % IJ SOLN
INTRAVENOUS | Status: DC | PRN
  Administered 2020-09-02: 500 mL

## 2020-09-02 MED ORDER — PROPOFOL 10 MG/ML IV BOLUS
INTRAVENOUS | Status: DC | PRN
  Administered 2020-09-02: 150 mg via INTRAVENOUS

## 2020-09-02 MED ORDER — ACETAMINOPHEN 500 MG PO TABS
1000.0000 mg | ORAL_TABLET | Freq: Once | ORAL | Status: AC
  Administered 2020-09-02: 1000 mg via ORAL

## 2020-09-02 MED ORDER — MIDAZOLAM HCL 2 MG/2ML IJ SOLN
INTRAMUSCULAR | Status: AC
  Filled 2020-09-02: qty 2

## 2020-09-02 MED ORDER — CELECOXIB 200 MG PO CAPS
200.0000 mg | ORAL_CAPSULE | Freq: Once | ORAL | Status: AC
  Administered 2020-09-02: 200 mg via ORAL

## 2020-09-02 MED ORDER — DEXAMETHASONE SODIUM PHOSPHATE 10 MG/ML IJ SOLN
INTRAMUSCULAR | Status: AC
  Filled 2020-09-02: qty 1

## 2020-09-02 MED ORDER — CELECOXIB 200 MG PO CAPS
ORAL_CAPSULE | ORAL | Status: AC
  Filled 2020-09-02: qty 1

## 2020-09-02 MED ORDER — FENTANYL CITRATE (PF) 100 MCG/2ML IJ SOLN
INTRAMUSCULAR | Status: DC | PRN
Start: 2020-09-02 — End: 2020-09-02
  Administered 2020-09-02: 50 ug via INTRAVENOUS
  Administered 2020-09-02: 100 ug via INTRAVENOUS
  Administered 2020-09-02: 50 ug via INTRAVENOUS

## 2020-09-02 MED ORDER — MIDAZOLAM HCL 5 MG/5ML IJ SOLN
INTRAMUSCULAR | Status: DC | PRN
  Administered 2020-09-02: 1 mg via INTRAVENOUS

## 2020-09-02 MED ORDER — LIDOCAINE HCL (CARDIAC) PF 100 MG/5ML IV SOSY
PREFILLED_SYRINGE | INTRAVENOUS | Status: DC | PRN
  Administered 2020-09-02: 40 mg via INTRAVENOUS

## 2020-09-02 MED ORDER — CEFAZOLIN SODIUM-DEXTROSE 2-4 GM/100ML-% IV SOLN
2.0000 g | INTRAVENOUS | Status: AC
  Administered 2020-09-02: 2 g via INTRAVENOUS

## 2020-09-02 MED ORDER — EPINEPHRINE PF 1 MG/ML IJ SOLN
INTRAMUSCULAR | Status: AC
  Filled 2020-09-02: qty 2

## 2020-09-02 MED ORDER — FENTANYL CITRATE (PF) 100 MCG/2ML IJ SOLN
25.0000 ug | INTRAMUSCULAR | Status: DC | PRN
  Administered 2020-09-02: 25 ug via INTRAVENOUS

## 2020-09-02 MED ORDER — LIDOCAINE-EPINEPHRINE 1 %-1:100000 IJ SOLN
INTRAMUSCULAR | Status: DC | PRN
  Administered 2020-09-02: 20 mL

## 2020-09-02 MED ORDER — LIDOCAINE 2% (20 MG/ML) 5 ML SYRINGE
INTRAMUSCULAR | Status: AC
  Filled 2020-09-02: qty 5

## 2020-09-02 MED ORDER — ONDANSETRON HCL 4 MG/2ML IJ SOLN
INTRAMUSCULAR | Status: AC
  Filled 2020-09-02: qty 2

## 2020-09-02 MED ORDER — BUPIVACAINE-EPINEPHRINE 0.25% -1:200000 IJ SOLN
INTRAMUSCULAR | Status: DC | PRN
  Administered 2020-09-02: 30 mL

## 2020-09-02 MED ORDER — LIDOCAINE HCL (PF) 1 % IJ SOLN
INTRAMUSCULAR | Status: AC
  Filled 2020-09-02: qty 120

## 2020-09-02 MED ORDER — DIPHENHYDRAMINE HCL 50 MG/ML IJ SOLN
INTRAMUSCULAR | Status: DC | PRN
  Administered 2020-09-02: 12.5 mg via INTRAVENOUS

## 2020-09-02 MED ORDER — LACTATED RINGERS IV SOLN: INTRAVENOUS | Status: DC | PRN

## 2020-09-02 MED ORDER — EPHEDRINE 5 MG/ML INJ
INTRAVENOUS | Status: AC
  Filled 2020-09-02: qty 10

## 2020-09-02 MED ORDER — SUCCINYLCHOLINE CHLORIDE 200 MG/10ML IV SOSY
PREFILLED_SYRINGE | INTRAVENOUS | Status: AC
  Filled 2020-09-02: qty 10

## 2020-09-02 MED ORDER — SODIUM CHLORIDE 0.9% FLUSH: 3.0000 mL | INTRAVENOUS | Status: DC | PRN

## 2020-09-02 SURGICAL SUPPLY — 73 items
ADH SKN CLS APL DERMABOND .7 (GAUZE/BANDAGES/DRESSINGS) ×3
APPLIER CLIP 9.375 MED OPEN (MISCELLANEOUS)
APR CLP MED 9.3 20 MLT OPN (MISCELLANEOUS)
BAG DECANTER FOR FLEXI CONT (MISCELLANEOUS) IMPLANT
BINDER ABDOMINAL 10 UNV 27-48 (MISCELLANEOUS) IMPLANT
BINDER ABDOMINAL 12 SM 30-45 (SOFTGOODS) ×3 IMPLANT
BIOPATCH RED 1 DISK 7.0 (GAUZE/BANDAGES/DRESSINGS) ×2 IMPLANT
BIOPATCH RED 1IN DISK 7.0MM (GAUZE/BANDAGES/DRESSINGS) ×1
BLADE CLIPPER SURG (BLADE) ×3 IMPLANT
BLADE HEX COATED 2.75 (ELECTRODE) ×6 IMPLANT
BLADE SURG 10 STRL SS (BLADE) ×3 IMPLANT
BLADE SURG 15 STRL LF DISP TIS (BLADE) ×2 IMPLANT
BLADE SURG 15 STRL SS (BLADE) ×6
BNDG GAUZE ELAST 4 BULKY (GAUZE/BANDAGES/DRESSINGS) IMPLANT
CANISTER SUCT 1200ML W/VALVE (MISCELLANEOUS) ×3 IMPLANT
CLIP APPLIE 9.375 MED OPEN (MISCELLANEOUS) IMPLANT
COVER WAND RF STERILE (DRAPES) IMPLANT
DERMABOND ADVANCED (GAUZE/BANDAGES/DRESSINGS) ×6
DERMABOND ADVANCED .7 DNX12 (GAUZE/BANDAGES/DRESSINGS) ×3 IMPLANT
DRAIN CHANNEL 15F RND FF W/TCR (WOUND CARE) IMPLANT
DRAIN CHANNEL 19F RND (DRAIN) ×3 IMPLANT
DRSG OPSITE POSTOP 4X12 (GAUZE/BANDAGES/DRESSINGS) ×6 IMPLANT
DRSG PAD ABDOMINAL 8X10 ST (GAUZE/BANDAGES/DRESSINGS) ×12 IMPLANT
ELECT BLADE 4.0 EZ CLEAN MEGAD (MISCELLANEOUS)
ELECT REM PT RETURN 9FT ADLT (ELECTROSURGICAL) ×3
ELECTRODE BLDE 4.0 EZ CLN MEGD (MISCELLANEOUS) IMPLANT
ELECTRODE REM PT RTRN 9FT ADLT (ELECTROSURGICAL) ×1 IMPLANT
EVACUATOR SILICONE 100CC (DRAIN) ×3 IMPLANT
GAUZE SPONGE 4X4 12PLY STRL (GAUZE/BANDAGES/DRESSINGS) ×4 IMPLANT
GLOVE BIO SURGEON STRL SZ 6.5 (GLOVE) ×8 IMPLANT
GLOVE BIO SURGEON STRL SZ7 (GLOVE) IMPLANT
GLOVE BIO SURGEONS STRL SZ 6.5 (GLOVE) ×4
GLOVE BIOGEL M STRL SZ7.5 (GLOVE) ×3 IMPLANT
GLOVE BIOGEL PI IND STRL 6.5 (GLOVE) ×1 IMPLANT
GLOVE BIOGEL PI IND STRL 7.5 (GLOVE) ×1 IMPLANT
GLOVE BIOGEL PI INDICATOR 6.5 (GLOVE) ×2
GLOVE BIOGEL PI INDICATOR 7.5 (GLOVE) ×2
GOWN STRL REUS W/ TWL LRG LVL3 (GOWN DISPOSABLE) ×2 IMPLANT
GOWN STRL REUS W/TWL LRG LVL3 (GOWN DISPOSABLE) ×6
LINER CANISTER 1000CC FLEX (MISCELLANEOUS) IMPLANT
NDL SAFETY ECLIPSE 18X1.5 (NEEDLE) IMPLANT
NEEDLE HYPO 18GX1.5 SHARP (NEEDLE)
NEEDLE HYPO 25X1 1.5 SAFETY (NEEDLE) IMPLANT
NS IRRIG 1000ML POUR BTL (IV SOLUTION) ×3 IMPLANT
PACK BASIN DAY SURGERY FS (CUSTOM PROCEDURE TRAY) ×3 IMPLANT
PACK UNIVERSAL I (CUSTOM PROCEDURE TRAY) ×3 IMPLANT
PENCIL SMOKE EVACUATOR (MISCELLANEOUS) ×3 IMPLANT
PIN SAFETY STERILE (MISCELLANEOUS) ×3 IMPLANT
SLEEVE SCD COMPRESS KNEE MED (MISCELLANEOUS) ×3 IMPLANT
SPONGE LAP 18X18 RF (DISPOSABLE) ×6 IMPLANT
STRIP SUTURE WOUND CLOSURE 1/2 (MISCELLANEOUS) IMPLANT
SUT ETHIBOND 0 MO6 C/R (SUTURE) ×3 IMPLANT
SUT MNCRL AB 4-0 PS2 18 (SUTURE) ×18 IMPLANT
SUT MON AB 3-0 SH 27 (SUTURE) ×24
SUT MON AB 3-0 SH27 (SUTURE) ×8 IMPLANT
SUT MON AB 5-0 PS2 18 (SUTURE) ×18 IMPLANT
SUT SILK 2 0 SH (SUTURE) IMPLANT
SUT SILK 3 0 PS 1 (SUTURE) ×3 IMPLANT
SYR 50ML LL SCALE MARK (SYRINGE) IMPLANT
SYR BULB IRRIG 60ML STRL (SYRINGE) ×3 IMPLANT
SYR CONTROL 10ML LL (SYRINGE) IMPLANT
SYR TB 1ML LL NO SAFETY (SYRINGE) IMPLANT
TOWEL GREEN STERILE FF (TOWEL DISPOSABLE) ×6 IMPLANT
TRAY DSU PREP LF (CUSTOM PROCEDURE TRAY) ×3 IMPLANT
TRAY FOL W/BAG SLVR 16FR STRL (SET/KITS/TRAYS/PACK) IMPLANT
TRAY FOLEY W/BAG SLVR 14FR LF (SET/KITS/TRAYS/PACK) IMPLANT
TRAY FOLEY W/BAG SLVR 16FR LF (SET/KITS/TRAYS/PACK)
TUBE CONNECTING 20'X1/4 (TUBING) ×1
TUBE CONNECTING 20X1/4 (TUBING) ×2 IMPLANT
TUBING INFILTRATION IT-10001 (TUBING) ×3 IMPLANT
TUBING SET GRADUATE ASPIR 12FT (MISCELLANEOUS) ×3 IMPLANT
UNDERPAD 30X36 HEAVY ABSORB (UNDERPADS AND DIAPERS) ×6 IMPLANT
YANKAUER SUCT BULB TIP NO VENT (SUCTIONS) ×6 IMPLANT

## 2020-09-02 NOTE — Anesthesia Procedure Notes (Signed)
Procedure Name: Intubation Date/Time: 09/02/2020 8:38 AM Performed by: Willa Frater, CRNA Pre-anesthesia Checklist: Patient identified, Emergency Drugs available, Suction available and Patient being monitored Patient Re-evaluated:Patient Re-evaluated prior to induction Oxygen Delivery Method: Circle system utilized Preoxygenation: Pre-oxygenation with 100% oxygen Induction Type: IV induction Ventilation: Mask ventilation without difficulty Grade View: Grade I Tube type: Oral Tube size: 7.0 mm Number of attempts: 1 Airway Equipment and Method: Stylet and Oral airway Placement Confirmation: ETT inserted through vocal cords under direct vision,  positive ETCO2 and breath sounds checked- equal and bilateral Secured at: 20 cm Tube secured with: Tape Dental Injury: Teeth and Oropharynx as per pre-operative assessment

## 2020-09-02 NOTE — Transfer of Care (Signed)
Immediate Anesthesia Transfer of Care Note  Patient: [name]  Procedure(s) Performed: PANNICULECTOMY WITH LIPOSUCTION (N/A Abdomen)  Patient Location: PACU  Anesthesia Type:General  Level of Consciousness: awake, alert , oriented, drowsy and patient cooperative  Airway & Oxygen Therapy: Patient Spontanous Breathing and Patient connected to face mask oxygen  Post-op Assessment: Report given to RN and Post -op Vital signs reviewed and stable  Post vital signs: Reviewed and stable  Last Vitals:  Vitals Value Taken Time  BP 130/62 09/02/20 1109  Temp    Pulse 58 09/02/20 1111  Resp 18 09/02/20 1111  SpO2 95 % 09/02/20 1111  Vitals shown include unvalidated device data.  Last Pain:  Vitals:   09/02/20 0751  TempSrc: Oral  PainSc: 0-No pain         Complications: No complications documented.

## 2020-09-02 NOTE — Anesthesia Postprocedure Evaluation (Signed)
Anesthesia Post Note  Patient: [name]  Procedure(s) Performed: PANNICULECTOMY WITH LIPOSUCTION (N/A Abdomen)     Patient location during evaluation: PACU Anesthesia Type: General Level of consciousness: awake and alert Pain management: pain level controlled Vital Signs Assessment: post-procedure vital signs reviewed and stable Respiratory status: spontaneous breathing, nonlabored ventilation, respiratory function stable and patient connected to nasal cannula oxygen Cardiovascular status: blood pressure returned to baseline and stable Postop Assessment: no apparent nausea or vomiting Anesthetic complications: no   No complications documented.  Last Vitals:  Vitals:   09/02/20 1148 09/02/20 1219  BP:  (!) 144/64  Pulse: (!) 43 (!) 58  Resp: 17 16  Temp:  36.5 C  SpO2: 95% 97%    Last Pain:  Vitals:   09/02/20 1148  TempSrc:   PainSc: 2                  Tiajuana Amass

## 2020-09-02 NOTE — Op Note (Signed)
Operative Report  Date of operation: 09/02/2020  Patient: [name], MRN: [id number], 69 y.o. female.   Date of birth: [birth year]  Location: [facility]  Preoperative Diagnosis: Panniculitis  Postoperative Diagnosis:  Same  Procedure: Panniculectomy 2259 gm and repair of two 4 cm fascia hernias  Surgeon:  Theodoro Kos Demontez Novack  Assistant:  Roetta Sessions, PA  Anesthesia:  General  EBL:  50 cc  Drains:  43 blake round drain  Condition:  Stable  Complications: None  Disposition: Recovery Room  Procedure in Detail: Patient was seen the morning of her surgery and marked out for the procedure. She was then given an IV and IV antibiotics. The patient was taken to the operating room and underwent general anesthesia.  A time out was called and all information was confirmed to be correct. SCD's and a pillow under the knees was in place. The patient was then prepped and draped in the standard sterile fashion. Local was placed into the incision and 2 stab incisions made through which tumescent was placed in each flank area. The flanks were liposuctioned and 700 cc removed from the sides and supraumbilical area. The planned lower incision was then incised and the incision taken down through the Scarpa's fascia to the rectus abdominus fascia. The skin and subcutaneous tissue was then lifted off the fascia up to the level of the umbilicus.  There were two areas of fascia hernias that were 4 cm in size.  One on the left side and one on the right side.  These were closed with figure eight stitches using 0 Ethibond.  The wound was irrigated with normal saline solution. A #19 blake round drain was placed and secured with 3-0 Silk to the left lateral side. The abdominal wall was closed with buried 3-0 Monocryl and subcuticular 4-0 Monocryl. The skin was closed with the 5-0 Monocryl.  The wound was then dressed with dermabond. ABD's and an abdominal binder were  placed. Patient was allowed to wake up, extubated and taken on a stretcher in the flexed position to the recovery room. Family was notified at the end of the case.   The advanced practice practitioner (APP) assisted throughout the case.  The APP was essential in retraction and counter traction when needed to make the case progress smoothly.  This retraction and assistance made it possible to see the tissue plans for the procedure.  The assistance was needed for blood control, tissue re-approximation and assisted with closure of the incision site.

## 2020-09-02 NOTE — Discharge Instructions (Signed)
INSTRUCTIONS FOR AFTER SURGERY   You will likely have some questions about what to expect following your operation.  The following information will help you and your family understand what to expect when you are discharged from the hospital.  Following these guidelines will help ensure a smooth recovery and reduce risks of complications.  Postoperative instructions include information on: diet, wound care, medications and physical activity.  AFTER SURGERY Expect to go home after the procedure.  In some cases, you may need to spend one night in the hospital for observation.  DIET This surgery does not require a specific diet.  However, I have to mention that the healthier you eat the better your body can start healing. It is important to increasing your protein intake.  This means limiting the foods with added sugar.  Focus on fruits and vegetables and some meat. It is very important to drink water after your surgery.  If your urine is bright yellow, then it is concentrated, and you need to drink more water.  As a general rule after surgery, you should have 8 ounces of water every hour while awake.  If you find you are persistently nauseated or unable to take in liquids let us know.  NO TOBACCO USE or EXPOSURE.  This will slow your healing process and increase the risk of a wound.  WOUND CARE If you have a drain: Clean with baby wipes until the drain is removed.   If you have steri-strips / tape directly attached to your skin leave them in place. It is OK to get these wet.  No baths, pools or hot tubs for two weeks. We close your incision to leave the smallest and best-looking scar. No ointment or creams on your incisions until given the go ahead.  Especially not Neosporin (Too many skin reactions with this one).  A few weeks after surgery you can use Mederma and start massaging the scar. We ask you to wear your binder or sports bra for the first 6 weeks around the clock, including while sleeping. This  provides added comfort and helps reduce the fluid accumulation at the surgery site.  ACTIVITY No heavy lifting until cleared by the doctor.  It is OK to walk and climb stairs. In fact, moving your legs is very important to decrease your risk of a blood clot.  It will also help keep you from getting deconditioned.  Every 1 to 2 hours get up and walk for 5 minutes. This will help with a quicker recovery back to normal.  Let pain be your guide so you don't do too much.  NO, you cannot do the spring cleaning and don't plan on taking care of anyone else.  This is your time for TLC.   WORK Everyone returns to work at different times. As a rough guide, most people take at least 1 - 2 weeks off prior to returning to work. If you need documentation for your job, bring the forms to your postoperative follow up visit.  DRIVING Arrange for someone to bring you home from the hospital.  You may be able to drive a few days after surgery but not while taking any narcotics or valium.  BOWEL MOVEMENTS Constipation can occur after anesthesia and while taking pain medication.  It is important to stay ahead for your comfort.  We recommend taking Milk of Magnesia (2 tablespoons; twice a day) while taking the pain pills.  SEROMA This is fluid your body tried to put in the  surgical site.  This is normal but if it creates excessive pain and swelling let us know.  It usually decreases in a few weeks.  MEDICATIONS and PAIN CONTROL At your preoperative visit for you history and physical you were given the following medications: 1. An antibiotic: Start this medication when you get home and take according to the instructions on the bottle. 2. Zofran 4 mg:  This is to treat nausea and vomiting.  You can take this every 6 hours as needed and only if needed. 3. Norco (hydrocodone/acetaminophen) 5/325 mg:  This is only to be used after you have taken the motrin or the tylenol. Every 8 hours as needed. Over the counter  Medication to take: 4. Ibuprofen (Motrin) 600 mg:  Take this every 6 hours.  If you have additional pain then take 500 mg of the tylenol.  Only take the Norco after you have tried these two. No Tylenol or Ibuprofen until 2:00pm if needed. 5. Miralax or stool softener of choice: Take this according to the bottle if you take the [place] Call your surgeon's office if any of the following occur: . Fever 101 degrees F or greater . Excessive bleeding or fluid from the incision site. . Pain that increases over time without aid from the medications . Redness, warmth, or pus draining from incision sites . Persistent nausea or inability to take in liquids . Severe misshapen area that underwent the operation.   Post Anesthesia Home Care Instructions  Activity: Get plenty of rest for the remainder of the day. A responsible individual must stay with you for 24 hours following the procedure.  For the next 24 hours, DO NOT: -Drive a car -Paediatric nurse -Drink alcoholic beverages -Take any medication unless instructed by your physician -Make any legal decisions or sign important papers.  Meals: Start with liquid foods such as gelatin or soup. Progress to regular foods as tolerated. Avoid greasy, spicy, heavy foods. If nausea and/or vomiting occur, drink only clear liquids until the nausea and/or vomiting subsides. Call your physician if vomiting continues.  Special Instructions/Symptoms: Your throat may feel dry or sore from the anesthesia or the breathing tube placed in your throat during surgery. If this causes discomfort, gargle with warm salt water. The discomfort should disappear within 24 hours.  If you had a scopolamine patch placed behind your ear for the management of post- operative nausea and/or vomiting:  1. The medication in the patch is effective for 72 hours, after which it should be removed.  Wrap patch in a tissue and discard in the trash. Wash hands thoroughly with  soap and water. 2. You may remove the patch earlier than 72 hours if you experience unpleasant side effects which may include dry mouth, dizziness or visual disturbances. 3. Avoid touching the patch. Wash your hands with soap and water after contact with the patch.    About my Jackson-Pratt Bulb Drain  What is a Jackson-Pratt bulb? A Jackson-Pratt is a soft, round device used to collect drainage. It is connected to a long, thin drainage catheter, which is held in place by one or two small stiches near your surgical incision site. When the bulb is squeezed, it forms a vacuum, forcing the drainage to empty into the bulb.  Emptying the Jackson-Pratt bulb- To empty the bulb: 1. Release the plug on the top of the bulb. 2. Pour the bulb's contents into a measuring container which your nurse will provide. 3. Record the time  emptied and amount of drainage. Empty the drain(s) as often as your     doctor or nurse recommends.  Date                  Time                    Amount (Drain 1)                 Amount (Drain 2)  _____________________________________________________________________  _____________________________________________________________________  _____________________________________________________________________  _____________________________________________________________________  _____________________________________________________________________  _____________________________________________________________________  _____________________________________________________________________  _____________________________________________________________________  Squeezing the Jackson-Pratt Bulb- To squeeze the bulb: 1. Make sure the plug at the top of the bulb is open. 2. Squeeze the bulb tightly in your fist. You will hear air squeezing from the bulb. 3. Replace the plug while the bulb is squeezed. 4. Use a safety pin to attach the bulb to your clothing. This will keep  the catheter from     pulling at the bulb insertion site.  When to call your doctor- Call your doctor if:  Drain site becomes red, swollen or hot.  You have a fever greater than 101 degrees F.  There is oozing at the drain site.  Drain falls out (apply a guaze bandage over the drain hole and secure it with tape).  Drainage increases daily not related to activity patterns. (You will usually have more drainage when you are active than when you are resting.)  Drainage has a bad odor.

## 2020-09-02 NOTE — Interval H&P Note (Signed)
History and Physical Interval Note:  09/02/2020 7:58 AM  [name]  has presented today for surgery, with the diagnosis of panniculitis.  The various methods of treatment have been discussed with the patient and family. After consideration of risks, benefits and other options for treatment, the patient has consented to  Procedure(s) with comments: PANNICULECTOMY WITH POSSIBLE LIPOSUCTION (N/A) - 3 hours, please as a surgical intervention.  The patient's history has been reviewed, patient examined, no change in status, stable for surgery.  I have reviewed the patient's chart and labs.  Questions were answered to the patient's satisfaction.     [name]

## 2020-09-02 NOTE — Anesthesia Preprocedure Evaluation (Addendum)
Anesthesia Evaluation  Patient identified by MRN, date of birth, ID band Patient awake    Reviewed: Allergy & Precautions, NPO status , Patient's Chart, lab work & pertinent test results, reviewed documented beta blocker date and time   Airway Mallampati: II  TM Distance: >3 FB Neck ROM: Full    Dental  (+) Dental Advisory Given   Pulmonary asthma ,    breath sounds clear to auscultation       Cardiovascular hypertension, Pt. on medications and Pt. on home beta blockers  Rhythm:Regular Rate:Normal     Neuro/Psych  Headaches,    GI/Hepatic Neg liver ROS, GERD  ,  Endo/Other  diabetes, Type 2  Renal/GU negative Renal ROS     Musculoskeletal  (+) Arthritis ,   Abdominal   Peds  Hematology negative hematology ROS (+)   Anesthesia Other Findings   Reproductive/Obstetrics                            Lab Results  Component Value Date   WBC 4.3 08/24/2020   HGB 13.6 08/24/2020   HCT 40.6 08/24/2020   MCV 89.4 08/24/2020   PLT 259 08/24/2020   Lab Results  Component Value Date   CREATININE 0.72 08/24/2020   BUN 12 08/24/2020   NA 141 08/24/2020   K 3.7 08/24/2020   CL 103 08/24/2020   CO2 32 08/24/2020    Anesthesia Physical Anesthesia Plan  ASA: III  Anesthesia Plan: General   Post-op Pain Management:    Induction: Intravenous  PONV Risk Score and Plan: 3 and Dexamethasone, Ondansetron, Treatment may vary due to age or medical condition and Midazolam  Airway Management Planned: Oral ETT  Additional Equipment: None  Intra-op Plan:   Post-operative Plan: Extubation in OR  Informed Consent: I have reviewed the patients History and Physical, chart, labs and discussed the procedure including the risks, benefits and alternatives for the proposed anesthesia with the patient or authorized representative who has indicated his/her understanding and acceptance.     Dental  advisory given  Plan Discussed with: CRNA  Anesthesia Plan Comments:         Anesthesia Quick Evaluation

## 2020-09-03 ENCOUNTER — Encounter (HOSPITAL_BASED_OUTPATIENT_CLINIC_OR_DEPARTMENT_OTHER): Payer: Self-pay | Admitting: Plastic Surgery

## 2020-09-03 LAB — SURGICAL PATHOLOGY

## 2020-09-08 ENCOUNTER — Ambulatory Visit (INDEPENDENT_AMBULATORY_CARE_PROVIDER_SITE_OTHER): Payer: BC Managed Care – PPO | Admitting: Plastic Surgery

## 2020-09-08 ENCOUNTER — Encounter: Payer: Self-pay | Admitting: Plastic Surgery

## 2020-09-08 ENCOUNTER — Other Ambulatory Visit: Payer: Self-pay

## 2020-09-08 VITALS — BP 136/80 | HR 89 | Temp 98.3°F

## 2020-09-08 DIAGNOSIS — E669 Obesity, unspecified: Secondary | ICD-10-CM

## 2020-09-08 NOTE — Progress Notes (Signed)
The patient is a 69 year old female here for follow-up after undergoing a panniculectomy on 10/6.  She had over 2000 g removed.  The drain is in place and appears to be working.  It is too soon to remove the drain.  The bulb was not charged to we got at least 50 cc out here in the office.  It was serosanguineous in appearance.  The honeycomb dressings are in place.  There does not appear to be a hematoma or a seroma.  The incisions appear to be healing well will plan to remove the drain at the next visit and possibly the dressings. She is very pleased with the results.

## 2020-09-15 ENCOUNTER — Encounter: Payer: Self-pay | Admitting: Surgical

## 2020-09-15 ENCOUNTER — Other Ambulatory Visit: Payer: Self-pay

## 2020-09-15 ENCOUNTER — Ambulatory Visit (INDEPENDENT_AMBULATORY_CARE_PROVIDER_SITE_OTHER): Payer: BC Managed Care – PPO | Admitting: Surgical

## 2020-09-15 VITALS — BP 148/98 | HR 65 | Temp 97.8°F

## 2020-09-15 DIAGNOSIS — Z9889 Other specified postprocedural states: Secondary | ICD-10-CM

## 2020-09-15 DIAGNOSIS — E65 Localized adiposity: Secondary | ICD-10-CM

## 2020-09-15 NOTE — Progress Notes (Signed)
Patient is a 69 year old female here for follow-up after panniculectomy with Dr. Marla Roe on 09/02/2020.  She reports she is overall doing well  She she reports her bowels are moving normally, she has not had any fevers, chills, nausea, vomiting.  She has had some difficulty with sleeping but otherwise is doing well. JP drain output over the past 24 hours has been approximately 150 cc.  Today she has had 100 cc of output.  Chaperone present on exam On exam the incisions are healing well, no incisional dehiscence noted.  Bruising noted throughout abdomen and lower abdomen, this is resolving.  All of the epithelium is viable without any necrosis noted.  Honeycomb dressings were removed to reveal intact incisions.  Left JP drain in place.  No cellulitic changes noted.  No subcutaneous fluid collections palpated.  It was decided to leave the JP drain in place due to the high output, recommend continue to wear compressive garment 24/7. Recommend following up in 1 week for reevaluation, possibly remove only the JP drain.

## 2020-09-21 ENCOUNTER — Other Ambulatory Visit: Payer: Self-pay

## 2020-09-21 ENCOUNTER — Ambulatory Visit (HOSPITAL_BASED_OUTPATIENT_CLINIC_OR_DEPARTMENT_OTHER)
Admission: RE | Admit: 2020-09-21 | Discharge: 2020-09-21 | Disposition: A | Discharge status: Home / Self Care | Payer: BC Managed Care – PPO | Source: Ambulatory Visit | Attending: Family Medicine | Admitting: Family Medicine

## 2020-09-21 DIAGNOSIS — Z9889 Other specified postprocedural states: Secondary | ICD-10-CM | POA: Insufficient documentation

## 2020-09-21 DIAGNOSIS — E049 Nontoxic goiter, unspecified: Secondary | ICD-10-CM | POA: Insufficient documentation

## 2020-09-21 NOTE — Progress Notes (Signed)
Patient is a 69 year old female here for follow-up after undergoing a panniculectomy with Dr. Marla Roe on 09/02/2020.  At last visit on 10/19 her drain output has been greater than 100 cc/day.  ~ 3 weeks PO Today patient reports she is doing very well.  Reports her left drain fell out and she is unsure what the output had been prior to this.  Incision is healing very nicely, C/D/I.  No signs of infection, redness, drainage.  There is some mild diffuse swelling present.  Patient denies fever/chills, nausea/vomiting.  Reports she has not needed to take any of the pain meds.  She should continue to wear compression garment 24/7 for at least 3 more weeks.  Discussed the importance of wearing the compression garment since the drain is out.  Patient verbalized understanding.  Continue to avoid heavy lifting.  May shower normally.  May apply Vaseline or desired scar cream to the incisions.  She has a follow-up scheduled for next week.  Call office with any questions/concerns.

## 2020-09-22 ENCOUNTER — Encounter: Payer: Self-pay | Admitting: Family Medicine

## 2020-09-23 ENCOUNTER — Encounter: Payer: Self-pay | Admitting: Plastic Surgery

## 2020-09-23 ENCOUNTER — Other Ambulatory Visit: Payer: Self-pay

## 2020-09-23 ENCOUNTER — Ambulatory Visit (INDEPENDENT_AMBULATORY_CARE_PROVIDER_SITE_OTHER): Payer: BC Managed Care – PPO | Admitting: Plastic Surgery

## 2020-09-23 VITALS — BP 136/80 | HR 86 | Temp 97.5°F

## 2020-09-23 DIAGNOSIS — Z9889 Other specified postprocedural states: Secondary | ICD-10-CM

## 2020-09-29 ENCOUNTER — Encounter: Payer: Self-pay | Admitting: Surgical

## 2020-09-29 ENCOUNTER — Other Ambulatory Visit: Payer: Self-pay

## 2020-09-29 ENCOUNTER — Ambulatory Visit (INDEPENDENT_AMBULATORY_CARE_PROVIDER_SITE_OTHER): Payer: BC Managed Care – PPO | Admitting: Surgical

## 2020-09-29 VITALS — BP 143/90 | HR 82 | Temp 98.4°F

## 2020-09-29 DIAGNOSIS — Z9889 Other specified postprocedural states: Secondary | ICD-10-CM

## 2020-09-29 NOTE — Progress Notes (Signed)
Patient is a 69 year old female here for follow-up after undergoing panniculectomy with Dr. Marla Roe on 09/02/2020.  Patient was last seen on 09/23/2020 and noted that her left JP drain fell out. Patient reports today that she is doing really well.  She is very pleased with how things are going.  She has no complaints.  She is continue to wear compression garment 24/7. She has some questions about recovery and returning to exercise.  Chaperone present on exam On exam panniculectomy incision intact, no wounds noted.  No erythema noted.  She does have a little bit of tenderness of the right lateral portion that has some scarring and likely little bit of fat necrosis.  There is no cellulitic changes.  No drainage noted.  Recommend continue her compression garment for 2 more weeks. Recommend avoiding strenuous activity for 2 more weeks. Recommend avoiding abdominal exercises for 2 to 3 months postop.  She can slowly transition to increasing exercise after 2 more weeks. I recommend she call with any questions or concerns, recommend following up in 2 to 3 months for reevaluation.  Pictures were obtained of the patient and placed in the chart with the patient's or guardian's permission.

## 2020-10-05 ENCOUNTER — Ambulatory Visit: Payer: Self-pay | Attending: Internal Medicine

## 2020-10-05 DIAGNOSIS — Z23 Encounter for immunization: Secondary | ICD-10-CM

## 2020-10-05 NOTE — Progress Notes (Signed)
   [facility]ov[facility] Vaccination [facility]  Name:  Krystal Teachey    MRN: [id number] DOB: [birth year]  10/05/2020  Ms. Middlekauff was observed post Covid-19 immunization for 15 minutes without incident. She was provided with Vaccine Information Sheet and instruction to access the V-Safe system.   Ms. Doane was instructed to call 911 with any severe reactions post vaccine: Marland Kitchen Difficulty breathing  . Swelling of face and throat  . A fast heartbeat  . A bad rash all over body  . Dizziness and weakness

## 2020-11-07 ENCOUNTER — Other Ambulatory Visit: Payer: Self-pay | Admitting: Internal Medicine

## 2020-11-18 LAB — HM MAMMOGRAPHY

## 2020-11-19 ENCOUNTER — Encounter: Payer: Self-pay | Admitting: Family Medicine

## 2020-12-02 ENCOUNTER — Ambulatory Visit: Payer: BC Managed Care – PPO | Admitting: Surgical

## 2020-12-07 ENCOUNTER — Other Ambulatory Visit: Payer: Self-pay | Admitting: Internal Medicine

## 2021-01-06 ENCOUNTER — Other Ambulatory Visit: Payer: Self-pay | Admitting: Internal Medicine

## 2021-02-02 ENCOUNTER — Encounter: Payer: Self-pay | Admitting: Family Medicine

## 2021-06-13 NOTE — Progress Notes (Addendum)
[facility] [address], [place], [place] [address] [phone]  Date:  06/16/2021   Name:  [name]   DOB:  11-27-1951   MRN:  [id number]  PCP:  Darreld Mclean, MD    Chief Complaint: Lump in Throat (Several months, difficulty swallowing/)   History of Present Illness:  [name] is a 70 y.o. very pleasant female patient who presents with the following:  History of hypertension, migraine headache, colon cancer status post sigmoid colectomy 2019, obesity, dyslipidemia, prediabetes, right lobe thyroidectomy 1992 for hyperthyroidism.  She is not sure if she had a throid nodule at that time  Pt seen today with concern of possible enlargement of left-sided thyroid nodule  No sx of overactive thyroid noted She does feel itchy however   Last US done in October IMPRESSION: Slight enlargement of the previously biopsied left thyroid TR 2 type nodule now measuring up to 6.4 cm, previously 4.5 cm. Correlate with prior pathology. Remote right thyroidectomy No regional adenopathy.  Thyroid bx 2016- [facility], cyst  ? Repeat bx- no She was seeing Dr Dwyane Dee back then   Colon- due next year  Shingrix- will give today  Covid booster- done   Labs done in September   She went to her 51st HS reunion last week which was a lot of fun for her   Lab Results  Component Value Date   TSH 0.36 (L) 08/24/2020   She has also noted wheezing for about 6 months; this seems to occur when she is laying in bed at night, may also occur other times during the day She did have asthma years ago but it has not bothered her recently She does not feel SOB    Patient Active Problem List   Diagnosis Date Noted   S/P panniculectomy 09/21/2020   Neck pain 12/20/2019   Colon cancer ([place]) 04/18/2018   Class 2 obesity with serious comorbidity and body mass index (BMI) of 38.0 to 38.9 in adult 07/27/2017    Vitamin D deficiency 07/27/2017   Other fatigue 06/29/2017   Shortness of breath on exertion 06/29/2017   Class 2 obesity with serious comorbidity and body mass index (BMI) of 39.0 to 39.9 in adult 06/29/2017   Left hip pain 04/18/2017   Pre-diabetes 02/05/2016   Left thyroid nodule 11/10/2014   TBI (traumatic brain injury) ([place]) 07/15/2014   Chest pain 12/13/2013   Dyslipidemia 12/13/2013   Obesity (BMI 35.0-39.9 without comorbidity) 12/13/2013   Hypertension 01/18/2012   GERD (gastroesophageal reflux disease) 01/18/2012   Depression 01/18/2012   Migraine syndrome 01/18/2012    Past Medical History:  Diagnosis Date   Allergy    Anemia    Anxiety    Arthritis    Asthma    Back pain    Blood transfusion without reported diagnosis    Cancer of sigmoid colon ([place])    Constipation    Depression    Diabetes mellitus without complication ([place])    Dyspnea    with exertion   Fatigue    Fatty liver    mild   GERD (gastroesophageal reflux disease)    Grade I diastolic dysfunction    Echo 08/08/2017 with Grade I DD, EF 51%, Mild Aortic regurgitation, Mild Tricuspid Regurgitation, Pulmonary pressure 30 mm Hg. She denies any chest pain, dyspnea, or peripheral edema.   Headaches due to old head injury    History of  stomach ulcers    Hyperlipidemia    Hypertension    Lactose intolerance    Left hip pain    Left thyroid nodule    Lipoma of extremity 10/17/2017   Left lower   Migraines    Obesity    Panniculitis    TBI (traumatic brain injury) ([place])    Thyroid disease    Vitamin D deficiency     Past Surgical History:  Procedure Laterality Date   ABDOMINAL HYSTERECTOMY     ABDOMINOPLASTY/PANNICULECTOMY WITH LIPOSUCTION N/A 09/02/2020   Procedure: PANNICULECTOMY WITH LIPOSUCTION;  Surgeon: Wallace Going, DO;  Location: [facility];  Service: Plastics;  Laterality: N/A;  3 hours, please   BREAST LUMPECTOMY WITH RADIOACTIVE SEED LOCALIZATION Left 08/23/2016    Procedure: LEFT BREAST LUMPECTOMY WITH RADIOACTIVE SEED LOCALIZATION;  Surgeon: Rolm Bookbinder, MD;  Location: [facility];  Service: General;  Laterality: Left;   BREAST SURGERY  2003   reduction   CESAREAN SECTION     CHALAZION EXCISION Left 2010   COLONOSCOPY W/ POLYPECTOMY  02/16/2018   Dr. Delaney Meigs JOINT INJECTION  2011 and 2012   FLEXIBLE SIGMOIDOSCOPY N/A 04/18/2018   Procedure: FLEXIBLE SIGMOIDOSCOPY;  Surgeon: Ileana Roup, MD;  Location: [facility];  Service: General;  Laterality: N/A;   LAPAROSCOPIC SIGMOID COLECTOMY N/A 04/18/2018   Procedure: LAPAROSCOPIC ASSISTED SIGMOIDECTOMY;  Surgeon: Ileana Roup, MD;  Location: [facility];  Service: General;  Laterality: N/A;   MYOMECTOMY     [facility]  03/2009   dipyridamole; normal pattern of perfusion in all regions, post-stress EF 65%, normal study    THYROIDECTOMY  1989   partial   TRANSTHORACIC ECHOCARDIOGRAM  03/2009   EF=>55%, borderline conc LVH; mild mitral annular calcif, trace MR; trace TR; AV mildly sclerotic, mild AV regurg; mild pulm valve regurg    TUBAL LIGATION      Social History   Tobacco Use   Smoking status: Never   Smokeless tobacco: Never  Vaping Use   Vaping Use: Never used  Substance Use Topics   Alcohol use: Yes    Comment: occ. beer   Drug use: No    Family History  Problem Relation Age of Onset   Heart disease Mother        MI, HTN   Hypertension Mother    Thyroid disease Mother    Hyperlipidemia Mother    Sudden death Mother    Anxiety disorder Mother    Heart disease Maternal Grandmother        MI, CVA   Heart disease Maternal Grandfather        MI   Stroke Maternal Grandfather    Heart disease Paternal Grandmother    Stroke Paternal Grandmother    Heart disease Paternal Grandfather        MI   Glaucoma Father    Diabetes Father    Depression Daughter    ADD / ADHD Son    Thyroid disease Sister    Heart attack Paternal Uncle         HTN   Hypertension Paternal Uncle    Colon cancer Neg Hx    Stomach cancer Neg Hx    Esophageal cancer Neg Hx    Rectal cancer Neg Hx     Allergies  Allergen Reactions   Hydrochlorothiazide Other (See Comments)    Increases risk of gout flares    Botox [Botulinum Toxin Type A] Swelling  Onabotulinumtoxina Swelling   Other Hives    Ubrelvy   Toprol Xl [Metoprolol Succinate] Hives    Medication list has been reviewed and updated.  Current Outpatient Medications on File Prior to Visit  Medication Sig Dispense Refill   amLODipine (NORVASC) 10 MG tablet Take 1 tablet (10 mg total) by mouth daily. 30 tablet 5   atorvastatin (LIPITOR) 40 MG tablet TAKE 1 TABLET(40 MG) BY MOUTH DAILY 90 tablet 3   butalbital-acetaminophen-caffeine (FIORICET) 50-325-40 MG tablet 1-2 po q 6 hrs prn-max 3/day     carvedilol (COREG) 6.25 MG tablet TAKE 1 TABLET BY MOUTH TWICE DAILY WITH MEALS 180 tablet 3   cetirizine (ZYRTEC) 10 MG tablet Take 10 mg by mouth daily as needed for allergies.     desvenlafaxine (PRISTIQ) 100 MG 24 hr tablet Take 100 mg by mouth daily.     Diclofenac Potassium,Migraine, 50 MG PACK Take 50 mg by mouth daily as needed (migraines).      diclofenac sodium (VOLTAREN) 1 % GEL Apply 2 g topically 4 (four) times daily. (Patient taking differently: Apply 2 g topically daily as needed (back pain).) 100 g 3   Fluocinolone Acetonide Body (DERMA-SMOOTHE/FS BODY) 0.01 % OIL Apply 1 application topically daily as needed. Leave on for 4 hours or overnight, then shampoo scalp 118.28 mL 2   fluticasone (FLONASE) 50 MCG/ACT nasal spray Place 2 sprays into both nostrils daily. 16 g 0   frovatriptan (FROVA) 2.5 MG tablet Take 2.5 mg by mouth as needed for migraine. If recurs, may repeat after 2 hours. Max of 3 tabs in 24 hours.     gabapentin (NEURONTIN) 300 MG capsule Take 300 mg by mouth 3 (three) times daily as needed (headaches).      ibuprofen (ADVIL,MOTRIN) 200 MG tablet Take 200 mg by mouth  3 (three) times daily as needed for headache or moderate pain.     ipratropium (ATROVENT) 0.03 % nasal spray Place 2 sprays into the nose 4 (four) times daily. (Patient taking differently: Place 2 sprays into the nose 4 (four) times daily as needed for rhinitis.) 30 mL 1   Misc. Devices MISC Patient to be out of work until next appointment follow in 8 weeks.     naproxen sodium (ALEVE) 220 MG tablet Take 220 mg by mouth daily as needed (pain).     nitroGLYCERIN (NITROSTAT) 0.4 MG SL tablet Place 1 tablet (0.4 mg total) under the tongue every 5 (five) minutes as needed. 30 tablet 0   Olopatadine HCl (PATADAY) 0.2 % SOLN Apply 1 drop daily as needed to eye (for itching). 2.5 mL 9   omeprazole (PRILOSEC) 40 MG capsule Take 40 mg by mouth daily.     ondansetron (ZOFRAN) 4 MG tablet Take 1 tablet (4 mg total) by mouth every 8 (eight) hours as needed for nausea or vomiting. 20 tablet 0   polyethylene glycol (MIRALAX / GLYCOLAX) packet Take 17 g by mouth daily.     sodium chloride (OCEAN) 0.65 % SOLN nasal spray Place 1 spray into both nostrils as needed for congestion. 480 mL 0   valACYclovir (VALTREX) 1000 MG tablet Take 1 tablet (1,000 mg total) by mouth daily. For suppression 90 tablet 2   valsartan (DIOVAN) 320 MG tablet TAKE 1 TABLET(320 MG) BY MOUTH DAILY 90 tablet 3   No current facility-administered medications on file prior to visit.    Review of Systems:  As per HPI- otherwise negative.   Physical Examination: Vitals:  06/16/21 0915  BP: 130/88  Pulse: (!) 58  Resp: 18  Temp: 97.9 F (36.6 C)  SpO2: 98%   Vitals:   06/16/21 0915  Weight: 213 lb (96.6 kg)  Height: 5\' 3"  (1.6 m)   Body mass index is 37.73 kg/m. Ideal Body Weight: Weight in (lb) to have BMI = 25: 140.8  GEN: no acute distress.  Obese, otherwise looks well HEENT: Atraumatic, Normocephalic.  Ears and Nose: No external deformity. CV: RRR, No M/G/R. No JVD. No thrill. No extra heart sounds. PULM: CTA B, no  wheezes, crackles, rhonchi. No retractions. No resp. distress. No accessory muscle use. ABD: S, NT, ND, +BS. No rebound. No HSM. EXTR: No c/c/e PSYCH: Normally interactive. Conversant.  She has a palpable thyroid nodule on the left side, at least 5 cm in diameter   Assessment and Plan: Thyroid cyst - Plan: TSH, T3, free, Ambulatory referral to Endocrinology, Ambulatory referral to ENT, US THYROID  Immunization due - Plan: Varicella-zoster vaccine IM (Shingrix)  Thyroid enlargement  Pharyngeal dysphagia  Itching - Plan: CBC, Comprehensive metabolic panel  Wheezing - Plan: albuterol (VENTOLIN HFA) 108 (90 Base) MCG/ACT inhaler  Pre-diabetes - Plan: Hemoglobin A1c Patient seen today with concern of likely enlargement of left-sided thyroid cyst.  It now seems to be affecting her swallowing ability.  Labs are pending, I will be ultrasound of her thyroid.  Made referral to ENT for possible surgical care at that neurology  Gave second dose of shingles vaccine  She notes wheezing, possible reactive airway disease.  I have prescribed albuterol for her to try, she will let me know if this is helpful  Will plan further follow- up pending labs.   This visit occurred during the SARS-CoV-2 public health emergency.  Safety protocols were in place, including screening questions prior to the visit, additional usage of staff PPE, and extensive cleaning of exam room while observing appropriate contact time as indicated for disinfecting solutions.   Signed Lamar Blinks, MD  Addendum 7/21, received reports as below-message to patient  US THYROID  Result Date: 06/17/2021 CLINICAL DATA:  Thyroid enlargement, previous right thyroidectomy and left thyroid biopsy performed 07/28/2015 EXAM: THYROID ULTRASOUND TECHNIQUE: Ultrasound examination of the thyroid gland and adjacent soft tissues was performed. COMPARISON:  09/21/2020 FINDINGS: Parenchymal Echotexture: Markedly heterogenous Isthmus: 1 cm  Right lobe: Surgically absent Left lobe: 6.9 x 3.6 x 5.5 cm _________________________________________________________ Estimated total number of nodules >/= 1 cm: 1 Number of spongiform nodules >/=  2 cm not described below (TR1): 0 Number of mixed cystic and solid nodules >/= 1.5 cm not described below (TR2): 0 _________________________________________________________ The previously biopsied left thyroid TR 2 mixed cystic/solid nodule continues to occupying the majority of the left thyroid lobe measuring 4.7 x 3.2 x 3.9 cm, previously 4.5 x 2.0 x 3.5 cm. There remain additional left thyroid mixed cystic/solid and spongiform nodules also measuring 2 cm or less. These also would not meet criteria for any biopsy or follow-up. Remote right thyroidectomy.  No regional adenopathy. IMPRESSION: Grossly stable previously biopsied left thyroid TR 2 nodule. Remote right thyroidectomy No new or enlarging thyroid abnormality. The above is in keeping with the ACR TI-RADS recommendations - J Am Coll Radiol 2017;14:587-595. Electronically Signed   By: Jerilynn Mages.  Shick M.D.   On: 06/17/2021 08:06    Results for orders placed or performed in visit on 06/16/21  CBC  Result Value Ref Range   WBC 4.2 4.0 - 10.5 K/uL   RBC 4.56  3.87 - 5.11 Mil/uL   Platelets 266.0 150.0 - 400.0 K/uL   Hemoglobin 13.7 12.0 - 15.0 g/dL   HCT 40.2 36.0 - 46.0 %   MCV 88.2 78.0 - 100.0 fl   MCHC 34.2 30.0 - 36.0 g/dL   RDW 14.6 11.5 - 15.5 %  Comprehensive metabolic panel  Result Value Ref Range   Sodium 143 135 - 145 mEq/L   Potassium 3.5 3.5 - 5.1 mEq/L   Chloride 105 96 - 112 mEq/L   CO2 30 19 - 32 mEq/L   Glucose, Bld 80 70 - 99 mg/dL   BUN 10 6 - 23 mg/dL   Creatinine, Ser 0.73 0.40 - 1.20 mg/dL   Total Bilirubin 0.4 0.2 - 1.2 mg/dL   Alkaline Phosphatase 75 39 - 117 U/L   AST 47 (H) 0 - 37 U/L   ALT 52 (H) 0 - 35 U/L   Total Protein 7.1 6.0 - 8.3 g/dL   Albumin 4.0 3.5 - 5.2 g/dL   GFR 84.37 >60.00 mL/min   Calcium 9.4 8.4 - 10.5  mg/dL  TSH  Result Value Ref Range   TSH 0.58 0.35 - 5.50 uIU/mL  T3, free  Result Value Ref Range   T3, Free 3.9 2.3 - 4.2 pg/mL  Hemoglobin A1c  Result Value Ref Range   Hgb A1c MFr Bld 5.9 4.6 - 6.5 %    LFT elevation is new A1c stable

## 2021-06-16 ENCOUNTER — Other Ambulatory Visit: Payer: Self-pay | Admitting: Family Medicine

## 2021-06-16 ENCOUNTER — Other Ambulatory Visit: Payer: Self-pay

## 2021-06-16 ENCOUNTER — Encounter: Payer: Self-pay | Admitting: Family Medicine

## 2021-06-16 ENCOUNTER — Ambulatory Visit: Payer: BC Managed Care – PPO | Admitting: Family Medicine

## 2021-06-16 ENCOUNTER — Ambulatory Visit (HOSPITAL_BASED_OUTPATIENT_CLINIC_OR_DEPARTMENT_OTHER)
Admission: RE | Admit: 2021-06-16 | Discharge: 2021-06-16 | Disposition: A | Discharge status: Home / Self Care | Payer: BC Managed Care – PPO | Source: Ambulatory Visit | Attending: Family Medicine | Admitting: Family Medicine

## 2021-06-16 VITALS — BP 130/88 | HR 58 | Temp 97.9°F | Resp 18 | Ht 63.0 in | Wt 213.0 lb

## 2021-06-16 DIAGNOSIS — Z23 Encounter for immunization: Secondary | ICD-10-CM | POA: Diagnosis not present

## 2021-06-16 DIAGNOSIS — L299 Pruritus, unspecified: Secondary | ICD-10-CM | POA: Diagnosis not present

## 2021-06-16 DIAGNOSIS — E049 Nontoxic goiter, unspecified: Secondary | ICD-10-CM | POA: Diagnosis not present

## 2021-06-16 DIAGNOSIS — R1313 Dysphagia, pharyngeal phase: Secondary | ICD-10-CM | POA: Diagnosis not present

## 2021-06-16 DIAGNOSIS — E041 Nontoxic single thyroid nodule: Secondary | ICD-10-CM

## 2021-06-16 DIAGNOSIS — R7303 Prediabetes: Secondary | ICD-10-CM

## 2021-06-16 DIAGNOSIS — R062 Wheezing: Secondary | ICD-10-CM

## 2021-06-16 DIAGNOSIS — R7401 Elevation of levels of liver transaminase levels: Secondary | ICD-10-CM

## 2021-06-16 LAB — CBC
HCT: 40.2 % (ref 36.0–46.0)
Hemoglobin: 13.7 g/dL (ref 12.0–15.0)
MCHC: 34.2 g/dL (ref 30.0–36.0)
MCV: 88.2 fl (ref 78.0–100.0)
Platelets: 266 10*3/uL (ref 150.0–400.0)
RBC: 4.56 Mil/uL (ref 3.87–5.11)
RDW: 14.6 % (ref 11.5–15.5)
WBC: 4.2 10*3/uL (ref 4.0–10.5)

## 2021-06-16 LAB — COMPREHENSIVE METABOLIC PANEL
ALT: 52 U/L — ABNORMAL HIGH (ref 0–35)
AST: 47 U/L — ABNORMAL HIGH (ref 0–37)
Albumin: 4 g/dL (ref 3.5–5.2)
Alkaline Phosphatase: 75 U/L (ref 39–117)
BUN: 10 mg/dL (ref 6–23)
CO2: 30 mEq/L (ref 19–32)
Calcium: 9.4 mg/dL (ref 8.4–10.5)
Chloride: 105 mEq/L (ref 96–112)
Creatinine, Ser: 0.73 mg/dL (ref 0.40–1.20)
GFR: 84.37 mL/min (ref >60.00)
Glucose, Bld: 80 mg/dL (ref 70–99)
Potassium: 3.5 mEq/L (ref 3.5–5.1)
Sodium: 143 mEq/L (ref 135–145)
Total Bilirubin: 0.4 mg/dL (ref 0.2–1.2)
Total Protein: 7.1 g/dL (ref 6.0–8.3)

## 2021-06-16 LAB — HEMOGLOBIN A1C: Hgb A1c MFr Bld: 5.9 % (ref 4.6–6.5)

## 2021-06-16 MED ORDER — ALBUTEROL SULFATE HFA 108 (90 BASE) MCG/ACT IN AERS: 2.0000 | INHALATION_SPRAY | Freq: Four times a day (QID) | RESPIRATORY_TRACT | 0 refills | Status: DC | PRN

## 2021-06-16 MED ORDER — AMLODIPINE BESYLATE 10 MG PO TABS
10.0000 mg | ORAL_TABLET | Freq: Every day | ORAL | 0 refills | Status: DC
Start: 2021-06-16 — End: 2021-10-18

## 2021-06-16 NOTE — Patient Instructions (Signed)
It was good to see you today!  2nd dose of shingrix given today Labs today Please stop by imaging and see if they can do your [place] today Referrals to [facility] (endocrine) and also ENT (surgery)

## 2021-06-17 ENCOUNTER — Encounter: Payer: Self-pay | Admitting: Family Medicine

## 2021-06-17 LAB — T3, FREE: T3, Free: 3.9 pg/mL (ref 2.3–4.2)

## 2021-06-17 LAB — TSH: TSH: 0.58 u[IU]/mL (ref 0.35–5.50)

## 2021-06-17 NOTE — Addendum Note (Signed)
Addended by: Lamar Blinks C on: 06/17/2021 02:02 PM   Modules accepted: Orders

## 2021-06-28 NOTE — Progress Notes (Signed)
[name] 70 y.o.     Reason for Appointment: Thyroid nodule, new consultation    History of Present Illness:   The patient's thyroid enlargement was first discovered in the late 1980s  She does not remember the details of this and this was evaluated in another state She was told to have surgery for this because of having symptoms of pressure such as swallowing difficulties. After her right thyroidectomy she was started on levothyroxine supplements These were stopped in 2016 since her TSH was low even with 25 mcg levothyroxine  She also had a thyroid biopsy on the mostly solid left lower pole nodule on 07/28/2015 which then measured 4.4 cm.  Cytology showed scant follicular epithelium and contents consistent with a thyroid cyst  She has now come in for a follow-up consultation because of her noticing increasing swelling in her left lower neck for about a year  She has had mild difficulty with swallowing for about a year Does not complain of any choking sensation in her neck or pressure in any position or when lying down.  She has been having periodic ultrasound exams on her thyroid through her PCP  Most recent ultrasound was on 06/16/2021 which showed the following   The previously biopsied left thyroid TR 2 mixed cystic/solid nodule continues to occupying the majority of the left thyroid lobe  measuring 4.7 x 3.2 x 3.9 cm, previously 4.5 x 2.0 x 3.5 cm.   There remain additional left thyroid mixed cystic/solid and spongiform nodules also measuring 2 cm or less  Lab Results  Component Value Date   FREET4 1.13 06/29/2017   FREET4 0.85 12/29/2014   FREET4 1.15 10/29/2014   TSH 0.58 06/16/2021   TSH 0.36 (L) 08/24/2020   TSH 0.50 03/25/2020      Allergies as of 06/29/2021       Reactions   Hydrochlorothiazide Other (See Comments)   Increases risk of gout flares   Botox [botulinum Toxin Type A] Swelling   Onabotulinumtoxina Swelling   Other Hives    Ubrelvy   Toprol Xl [metoprolol Succinate] Hives        Medication List        Accurate as of June 29, 2021  8:47 PM. If you have any questions, ask your nurse or doctor.          albuterol 108 (90 Base) MCG/ACT inhaler Commonly known as: VENTOLIN HFA INHALE 2 PUFFS INTO THE LUNGS EVERY 6 HOURS AS NEEDED FOR WHEEZING OR SHORTNESS OF BREATH   amLODipine 10 MG tablet Commonly known as: NORVASC Take 1 tablet (10 mg total) by mouth daily. Pt needs to make appt with provider for further refills - 1st attempt   atorvastatin 40 MG tablet Commonly known as: LIPITOR TAKE 1 TABLET(40 MG) BY MOUTH DAILY   butalbital-acetaminophen-caffeine 50-325-40 MG tablet Commonly known as: FIORICET 1-2 po q 6 hrs prn-max 3/day   carvedilol 6.25 MG tablet Commonly known as: COREG TAKE 1 TABLET BY MOUTH TWICE DAILY WITH MEALS   cetirizine 10 MG tablet Commonly known as: ZYRTEC Take 10 mg by mouth daily as needed for allergies.   desvenlafaxine 100 MG 24 hr tablet Commonly known as: PRISTIQ Take 100 mg by mouth daily.   Diclofenac Potassium(Migraine) 50 MG Pack Take 50 mg by mouth daily as needed (migraines).   diclofenac sodium 1 % Gel Commonly known as: VOLTAREN Apply 2 g topically 4 (four) times daily. What changed:  when to take this reasons  to take this   Fluocinolone Acetonide Body 0.01 % Oil Commonly known as: Derma-Smoothe/FS Body Apply 1 application topically daily as needed. Leave on for 4 hours or overnight, then shampoo scalp   fluticasone 50 MCG/ACT nasal spray Commonly known as: FLONASE Place 2 sprays into both nostrils daily.   frovatriptan 2.5 MG tablet Commonly known as: FROVA Take 2.5 mg by mouth as needed for migraine. If recurs, may repeat after 2 hours. Max of 3 tabs in 24 hours.   gabapentin 300 MG capsule Commonly known as: NEURONTIN Take 300 mg by mouth 3 (three) times daily as needed (headaches).   ibuprofen 200 MG tablet Commonly known as:  ADVIL Take 200 mg by mouth 3 (three) times daily as needed for headache or moderate pain.   ipratropium 0.03 % nasal spray Commonly known as: ATROVENT Place 2 sprays into the nose 4 (four) times daily. What changed:  when to take this reasons to take this   Misc. Devices Misc Patient to be out of work until next appointment follow in 8 weeks.   naproxen sodium 220 MG tablet Commonly known as: ALEVE Take 220 mg by mouth daily as needed (pain).   nitroGLYCERIN 0.4 MG SL tablet Commonly known as: NITROSTAT Place 1 tablet (0.4 mg total) under the tongue every 5 (five) minutes as needed.   Olopatadine HCl 0.2 % Soln Commonly known as: Pataday Apply 1 drop daily as needed to eye (for itching).   omeprazole 40 MG capsule Commonly known as: PRILOSEC Take 40 mg by mouth daily.   ondansetron 4 MG tablet Commonly known as: Zofran Take 1 tablet (4 mg total) by mouth every 8 (eight) hours as needed for nausea or vomiting.   polyethylene glycol 17 g packet Commonly known as: MIRALAX / GLYCOLAX Take 17 g by mouth daily.   sodium chloride 0.65 % Soln nasal spray Commonly known as: OCEAN Place 1 spray into both nostrils as needed for congestion.   valACYclovir 1000 MG tablet Commonly known as: VALTREX Take 1 tablet (1,000 mg total) by mouth daily. For suppression   valsartan 320 MG tablet Commonly known as: DIOVAN TAKE 1 TABLET(320 MG) BY MOUTH DAILY        Allergies:  Allergies  Allergen Reactions   Hydrochlorothiazide Other (See Comments)    Increases risk of gout flares    Botox [Botulinum Toxin Type A] Swelling   Onabotulinumtoxina Swelling   Other Hives    Ubrelvy   Toprol Xl [Metoprolol Succinate] Hives    Past Medical History:  Diagnosis Date   Allergy    Anemia    Anxiety    Arthritis    Asthma    Back pain    Blood transfusion without reported diagnosis    Cancer of sigmoid colon ([place])    Constipation    Depression    Diabetes mellitus without  complication (HCC)    Dyspnea    with exertion   Fatigue    Fatty liver    mild   GERD (gastroesophageal reflux disease)    Grade I diastolic dysfunction    Echo 08/08/2017 with Grade I DD, EF 51%, Mild Aortic regurgitation, Mild Tricuspid Regurgitation, Pulmonary pressure 30 mm Hg. She denies any chest pain, dyspnea, or peripheral edema.   Headaches due to old head injury    History of stomach ulcers    Hyperlipidemia    Hypertension    Lactose intolerance    Left hip pain    Left thyroid nodule  Lipoma of extremity 10/17/2017   Left lower   Migraines    Obesity    Panniculitis    TBI (traumatic brain injury) ([place])    Thyroid disease    Vitamin D deficiency     Past Surgical History:  Procedure Laterality Date   ABDOMINAL HYSTERECTOMY     ABDOMINOPLASTY/PANNICULECTOMY WITH LIPOSUCTION N/A 09/02/2020   Procedure: PANNICULECTOMY WITH LIPOSUCTION;  Surgeon: Wallace Going, DO;  Location: [place];  Service: Plastics;  Laterality: N/A;  3 hours, please   BREAST LUMPECTOMY WITH RADIOACTIVE SEED LOCALIZATION Left 08/23/2016   Procedure: LEFT BREAST LUMPECTOMY WITH RADIOACTIVE SEED LOCALIZATION;  Surgeon: Rolm Bookbinder, MD;  Location: [place];  Service: General;  Laterality: Left;   BREAST SURGERY  2003   reduction   CESAREAN SECTION     CHALAZION EXCISION Left 2010   COLONOSCOPY W/ POLYPECTOMY  02/16/2018   Dr. Delaney Meigs JOINT INJECTION  2011 and 2012   FLEXIBLE SIGMOIDOSCOPY N/A 04/18/2018   Procedure: FLEXIBLE SIGMOIDOSCOPY;  Surgeon: Ileana Roup, MD;  Location: [facility];  Service: General;  Laterality: N/A;   LAPAROSCOPIC SIGMOID COLECTOMY N/A 04/18/2018   Procedure: LAPAROSCOPIC ASSISTED SIGMOIDECTOMY;  Surgeon: Ileana Roup, MD;  Location: [facility];  Service: General;  Laterality: N/A;   Onset  03/2009   dipyridamole; normal pattern of perfusion in all regions, post-stress EF  65%, normal study    THYROIDECTOMY  1989   partial   TRANSTHORACIC ECHOCARDIOGRAM  03/2009   EF=>55%, borderline conc LVH; mild mitral annular calcif, trace MR; trace TR; AV mildly sclerotic, mild AV regurg; mild pulm valve regurg    TUBAL LIGATION      Family History  Problem Relation Age of Onset   Heart disease Mother        MI, HTN   Hypertension Mother    Thyroid disease Mother    Hyperlipidemia Mother    Sudden death Mother    Anxiety disorder Mother    Heart disease Maternal Grandmother        MI, CVA   Heart disease Maternal Grandfather        MI   Stroke Maternal Grandfather    Heart disease Paternal Grandmother    Stroke Paternal Grandmother    Heart disease Paternal Grandfather        MI   Glaucoma Father    Diabetes Father    Depression Daughter    ADD / ADHD Son    Thyroid disease Sister    Heart attack Paternal Uncle        HTN   Hypertension Paternal Uncle    Colon cancer Neg Hx    Stomach cancer Neg Hx    Esophageal cancer Neg Hx    Rectal cancer Neg Hx     Social History:  reports that she has never smoked. She has never used smokeless tobacco. She reports current alcohol use. She reports that she does not use drugs.  Review of Systems  Cardiovascular:  Negative for leg swelling.  Gastrointestinal:  Positive for heartburn.  Musculoskeletal:  Negative for joint pain.  Neurological:  Positive for headaches.   She has been told to have prediabetes because of an A1c of 5.9 but recent glucose was 80  On treatment for hypertension   Examination:   BP (!) 150/90   Pulse 63   Ht '5\' 3"'$  (1.6 m)   Wt 211  lb 6.4 oz (95.9 kg)   SpO2 97%   BMI 37.45 kg/m    General Appearance: pleasant, well-built and nourished She has generalized obesity present      Eyes: No prominence or swelling.           Neck: The thyroid is enlarged mostly on the left side extending to the isthmus, slightly firm and smooth. Overall the nodule measures 8 cm across Neck  circumference is 40 cm  No distinct right-sided thyroid enlargement felt  No discrete nodule is felt There is no stridor. Pemberton sign is negative  There is no cervical lymphadenopathy.     Cardiovascular: Normal  heart sounds, no murmur Respiratory:  Lungs clear  Extremities: No edema   Assessment/Plan:  MULTINODULAR goiter:  Long-standing history of multinodular goiter treated previously with right thyroidectomy for benign nodule likely Also has a strong family history of goiter  Most of her thyroid enlargement on the left is related to a complex cystic/solid nodule which was benign on biopsy previously This appears to be minimally increased since 2016 with previous measurement 4.4 cm and now 4.7 cm  Her thyroid enlargement on the left has gradually increased Although she has mild difficulty swallowing over the last year she does not have any local pressure or choking Objectively no signs of tracheal compression  She has been euthyroid without any thyroid supplementation Also since her TSH is generally low normal and she is not a candidate for thyroid suppression  Recommendations:  Given her the options of surgical treatment or observation alone Surgery would be appropriate if she has increasing local pressure symptoms and significant difficulty swallowing or symptoms/signs of tracheal compression Showed her the diagrams of normal thyroid, surrounding structures and possible risks of thyroidectomy including laryngeal nerve injury or hypoparathyroidism  At this time the patient is comfortable with observation alone She will come back in about 6 months for follow-up unless she has any increasing local pressure symptoms Also unlikely that further ultrasound exams will give [place] any further information  Elayne Snare 06/29/2021

## 2021-06-29 ENCOUNTER — Other Ambulatory Visit: Payer: Self-pay

## 2021-06-29 ENCOUNTER — Encounter: Payer: Self-pay | Admitting: Endocrinology

## 2021-06-29 ENCOUNTER — Ambulatory Visit: Payer: BC Managed Care – PPO | Admitting: Endocrinology

## 2021-06-29 VITALS — BP 150/90 | HR 63 | Ht 63.0 in | Wt 211.4 lb

## 2021-06-29 DIAGNOSIS — E041 Nontoxic single thyroid nodule: Secondary | ICD-10-CM | POA: Diagnosis not present

## 2021-06-29 DIAGNOSIS — R1319 Other dysphagia: Secondary | ICD-10-CM

## 2021-07-28 ENCOUNTER — Other Ambulatory Visit: Payer: Self-pay

## 2021-07-28 ENCOUNTER — Ambulatory Visit (INDEPENDENT_AMBULATORY_CARE_PROVIDER_SITE_OTHER): Payer: BC Managed Care – PPO | Admitting: Otolaryngology

## 2021-07-28 DIAGNOSIS — E049 Nontoxic goiter, unspecified: Secondary | ICD-10-CM | POA: Diagnosis not present

## 2021-07-28 NOTE — Progress Notes (Signed)
HPI: [name] is a 70 y.o. female who presents is referred by her PCP for evaluation of left thyroid enlargement.  She has had a previous history of thyroid goiter and apparently had a right thyroid lobectomy performed in [place] in 1992.  She used to take Synthroid on a regular basis but over the past few years has been off of Synthroid. She has noticed enlarged left thyroid lobe for several years and has had repeated thyroid ultrasounds performed of the past 5 years.  She apparently also had a fine-needle aspirate performed of a left thyroid nodule 5 years ago that was benign.  Her most recent ultrasound performed a month ago demonstrated a stable left thyroid gland with no significant enlargement..  Past Medical History:  Diagnosis Date   Allergy    Anemia    Anxiety    Arthritis    Asthma    Back pain    Blood transfusion without reported diagnosis    Cancer of sigmoid colon ([place])    Constipation    Depression    Diabetes mellitus without complication ([place])    Dyspnea    with exertion   Fatigue    Fatty liver    mild   GERD (gastroesophageal reflux disease)    Grade I diastolic dysfunction    Echo 08/08/2017 with Grade I DD, EF 51%, Mild Aortic regurgitation, Mild Tricuspid Regurgitation, Pulmonary pressure 30 mm Hg. She denies any chest pain, dyspnea, or peripheral edema.   Headaches due to old head injury    History of stomach ulcers    Hyperlipidemia    Hypertension    Lactose intolerance    Left hip pain    Left thyroid nodule    Lipoma of extremity 10/17/2017   Left lower   Migraines    Obesity    Panniculitis    TBI (traumatic brain injury) ([place])    Thyroid disease    Vitamin D deficiency    Past Surgical History:  Procedure Laterality Date   ABDOMINAL HYSTERECTOMY     ABDOMINOPLASTY/PANNICULECTOMY WITH LIPOSUCTION N/A 09/02/2020   Procedure: PANNICULECTOMY WITH LIPOSUCTION;  Surgeon: Wallace Going, DO;  Location: [facility];   Service: Plastics;  Laterality: N/A;  3 hours, please   BREAST LUMPECTOMY WITH RADIOACTIVE SEED LOCALIZATION Left 08/23/2016   Procedure: LEFT BREAST LUMPECTOMY WITH RADIOACTIVE SEED LOCALIZATION;  Surgeon: Rolm Bookbinder, MD;  Location: [place];  Service: General;  Laterality: Left;   BREAST SURGERY  2003   reduction   CESAREAN SECTION     CHALAZION EXCISION Left 2010   COLONOSCOPY W/ POLYPECTOMY  02/16/2018   Dr. Delaney Meigs JOINT INJECTION  2011 and 2012   FLEXIBLE SIGMOIDOSCOPY N/A 04/18/2018   Procedure: FLEXIBLE SIGMOIDOSCOPY;  Surgeon: Ileana Roup, MD;  [facility];  Service: General;  Laterality: N/A;   LAPAROSCOPIC SIGMOID COLECTOMY N/A 04/18/2018   Procedure: LAPAROSCOPIC ASSISTED SIGMOIDECTOMY;  Surgeon: Ileana Roup, MD;  [facility];  Service: General;  Laterality: N/A;   MYOMECTOMY     NM [place]  03/2009   dipyridamole; normal pattern of perfusion in all regions, post-stress EF 65%, normal study    THYROIDECTOMY  1989   partial   TRANSTHORACIC ECHOCARDIOGRAM  03/2009   EF=>55%, borderline conc LVH; mild mitral annular calcif, trace MR; trace TR; AV mildly sclerotic, mild AV regurg; mild pulm valve regurg    TUBAL LIGATION     Social History  Socioeconomic History   Marital status: Married    Spouse name: Carloyn Manner   Number of children: 2   Years of education: Not on file   Highest education level: Not on file  Occupational History   Occupation: retired    Fish farm manager: [employer]  Tobacco Use   Smoking status: Never   Smokeless tobacco: Never  Vaping Use   Vaping Use: Never used  Substance and Sexual Activity   Alcohol use: Yes    Comment: occ. beer   Drug use: No   Sexual activity: Not on file  Other Topics Concern   Not on file  Social History Narrative   Not on file   Social Determinants of Health   Financial Resource Strain: Not on file  Food Insecurity: Not on file   Transportation Needs: Not on file  Physical Activity: Not on file  Stress: Not on file  Social Connections: Not on file   Family History  Problem Relation Age of Onset   Heart disease Mother        MI, HTN   Hypertension Mother    Thyroid disease Mother    Hyperlipidemia Mother    Sudden death Mother    Anxiety disorder Mother    Heart disease Maternal Grandmother        MI, CVA   Heart disease Maternal Grandfather        MI   Stroke Maternal Grandfather    Heart disease Paternal Grandmother    Stroke Paternal Grandmother    Heart disease Paternal Grandfather        MI   Glaucoma Father    Diabetes Father    Depression Daughter    ADD / ADHD Son    Thyroid disease Sister    Heart attack Paternal Uncle        HTN   Hypertension Paternal Uncle    Colon cancer Neg Hx    Stomach cancer Neg Hx    Esophageal cancer Neg Hx    Rectal cancer Neg Hx    Allergies  Allergen Reactions   Hydrochlorothiazide Other (See Comments)    Increases risk of gout flares    Botox [Botulinum Toxin Type A] Swelling   Onabotulinumtoxina Swelling   Other Hives    Ubrelvy   Toprol Xl [Metoprolol Succinate] Hives   Prior to Admission medications   Medication Sig Start Date End Date Taking? Authorizing Provider  albuterol (VENTOLIN HFA) 108 (90 Base) MCG/ACT inhaler INHALE 2 PUFFS INTO THE LUNGS EVERY 6 HOURS AS NEEDED FOR WHEEZING OR SHORTNESS OF BREATH 06/16/21   Copland, Gay Filler, MD  amLODipine (NORVASC) 10 MG tablet Take 1 tablet (10 mg total) by mouth daily. Pt needs to make appt with provider for further refills - 1st attempt 06/16/21   Pixie Casino, MD  atorvastatin (LIPITOR) 40 MG tablet TAKE 1 TABLET(40 MG) BY MOUTH DAILY 12/07/20   Hilty, Nadean Corwin, MD  butalbital-acetaminophen-caffeine (FIORICET) [phone] MG tablet 1-2 po q 6 hrs prn-max 3/day 05/08/20   [provider]  carvedilol (COREG) 6.25 MG tablet TAKE 1 TABLET BY MOUTH TWICE DAILY WITH MEALS 01/06/21   Hilty,  Nadean Corwin, MD  cetirizine (ZYRTEC) 10 MG tablet Take 10 mg by mouth daily as needed for allergies.    [provider]  desvenlafaxine (PRISTIQ) 100 MG 24 hr tablet Take 100 mg by mouth daily.    [provider]  Diclofenac Potassium,Migraine, 50 MG PACK Take 50 mg by mouth daily  as needed (migraines).     [provider]  diclofenac sodium (VOLTAREN) 1 % GEL Apply 2 g topically 4 (four) times daily. Patient taking differently: Apply 2 g topically daily as needed (back pain). 02/05/16   Copland, Gay Filler, MD  Fluocinolone Acetonide Body (DERMA-SMOOTHE/FS BODY) 0.01 % OIL Apply 1 application topically daily as needed. Leave on for 4 hours or overnight, then shampoo scalp 08/21/19   Copland, Gay Filler, MD  fluticasone (FLONASE) 50 MCG/ACT nasal spray Place 2 sprays into both nostrils daily. 04/06/20   Horton, Barbette Hair, MD  frovatriptan (FROVA) 2.5 MG tablet Take 2.5 mg by mouth as needed for migraine. If recurs, may repeat after 2 hours. Max of 3 tabs in 24 hours.    [provider]  gabapentin (NEURONTIN) 300 MG capsule Take 300 mg by mouth 3 (three) times daily as needed (headaches).     [provider]  ibuprofen (ADVIL,MOTRIN) 200 MG tablet Take 200 mg by mouth 3 (three) times daily as needed for headache or moderate pain.    [provider]  ipratropium (ATROVENT) 0.03 % nasal spray Place 2 sprays into the nose 4 (four) times daily. Patient taking differently: Place 2 sprays into the nose 4 (four) times daily as needed for rhinitis. 10/08/13   Shawnee Knapp, MD  Misc. Devices MISC Patient to be out of work until next appointment follow in 8 weeks. 05/08/20   [provider]  naproxen sodium (ALEVE) 220 MG tablet Take 220 mg by mouth daily as needed (pain).    [provider]  nitroGLYCERIN (NITROSTAT) 0.4 MG SL tablet Place 1 tablet (0.4 mg total) under the tongue every 5 (five) minutes as needed. 10/08/13   Shawnee Knapp, MD   Olopatadine HCl (PATADAY) 0.2 % SOLN Apply 1 drop daily as needed to eye (for itching). 10/12/17   Copland, Gay Filler, MD  omeprazole (PRILOSEC) 40 MG capsule Take 40 mg by mouth daily.    [provider]  ondansetron (ZOFRAN) 4 MG tablet Take 1 tablet (4 mg total) by mouth every 8 (eight) hours as needed for nausea or vomiting. 08/11/20   Young, Johanna C, PA-C  polyethylene glycol (MIRALAX / GLYCOLAX) packet Take 17 g by mouth daily.    [provider]  sodium chloride (OCEAN) 0.65 % SOLN nasal spray Place 1 spray into both nostrils as needed for congestion. 04/06/20   Horton, Barbette Hair, MD  valACYclovir (VALTREX) 1000 MG tablet Take 1 tablet (1,000 mg total) by mouth daily. For suppression 03/01/20   Copland, Gay Filler, MD  valsartan (DIOVAN) 320 MG tablet TAKE 1 TABLET(320 MG) BY MOUTH DAILY 11/10/20   Hilty, Nadean Corwin, MD     Positive ROS: Otherwise negative  All other systems have been reviewed and were otherwise negative with the exception of those mentioned in the HPI and as above.  Physical Exam: Constitutional: Alert, well-appearing, no acute distress Ears: External ears without lesions or tenderness. Ear canals are clear bilaterally with intact, clear TMs.  Nasal: External nose without lesions.. Clear nasal passages Oral: Lips and gums without lesions. Tongue and palate mucosa without lesions. Posterior oropharynx clear. Neck: No palpable adenopathy or masses.  The left thyroid lobe is enlarged and easily palpable.  There is no supraclavicular adenopathy noted and no significant adenopathy noted along the jugular chain lymph nodes. Respiratory: Breathing comfortably  Skin: No facial/neck lesions or rash noted.  Procedures  Assessment: Left thyroid lobe enlargement  with history  of cystic lesion that has been stable on recent ultrasound. Patient is status post right thyroidectomy performed in 1992  Plan: Briefly discussed with her that this is a benign  enlargement of the left thyroid lobe.  If it becomes symptomatic she could consider having this removed but would have to be on Synthroid if she had a left thyroid lobe removed.  She could also consider being placed on thyroid replacement hormone as this would make the left thyroid lobe less active and perhaps may decrease in size but would turn this over to her medical doctor or endocrinologist.   Radene Journey, MD   CC:

## 2021-08-02 IMAGING — US US THYROID
1 series · 13 of 25 positions shown · non-contrast
Comparison: 09/21/2020

CLINICAL DATA: Thyroid enlargement, previous right thyroidectomy
and left thyroid biopsy performed 07/28/2015

EXAM:
THYROID ULTRASOUND
TECHNIQUE: Ultrasound examination of the thyroid gland and adjacent soft
tissues was performed.

[Series 1: us thyroid · 42 acquisitions, 13 frames shown]
[im 1/42]
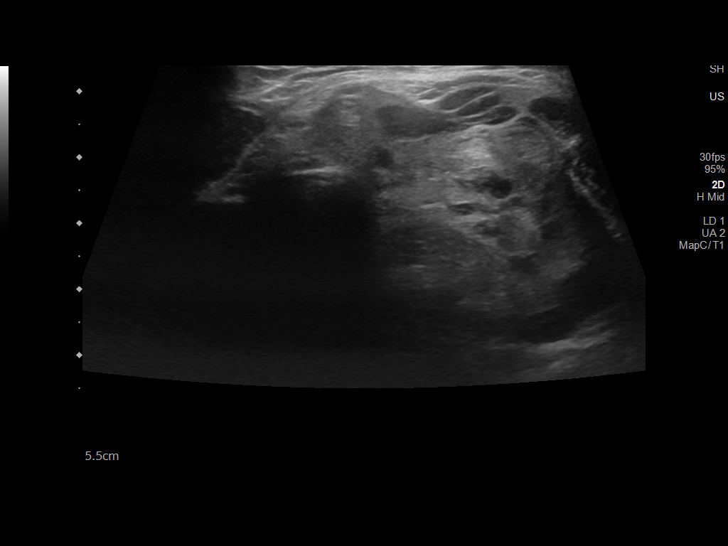
[im 4/42]
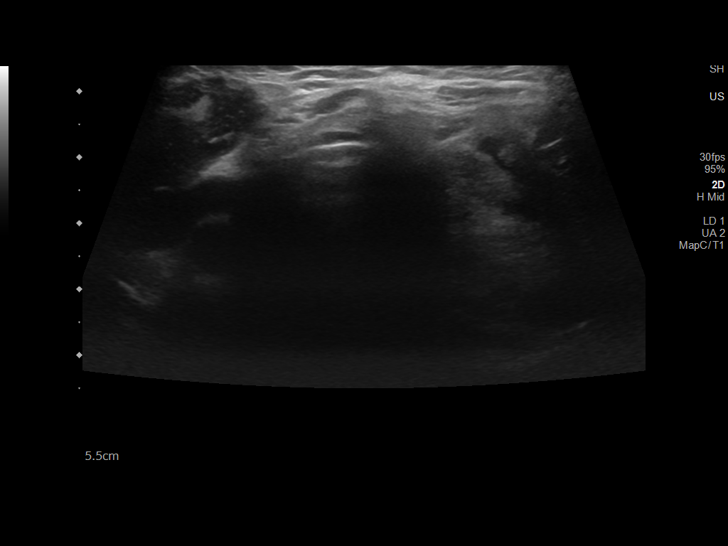
[im 7/42]
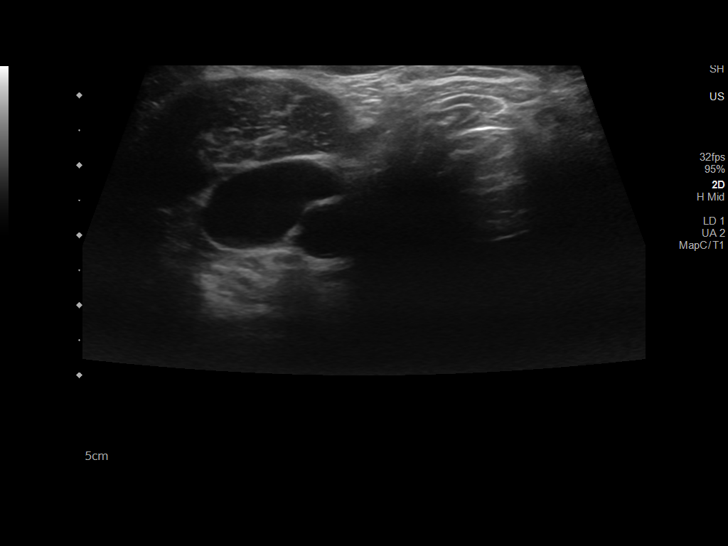
[im 11/42]
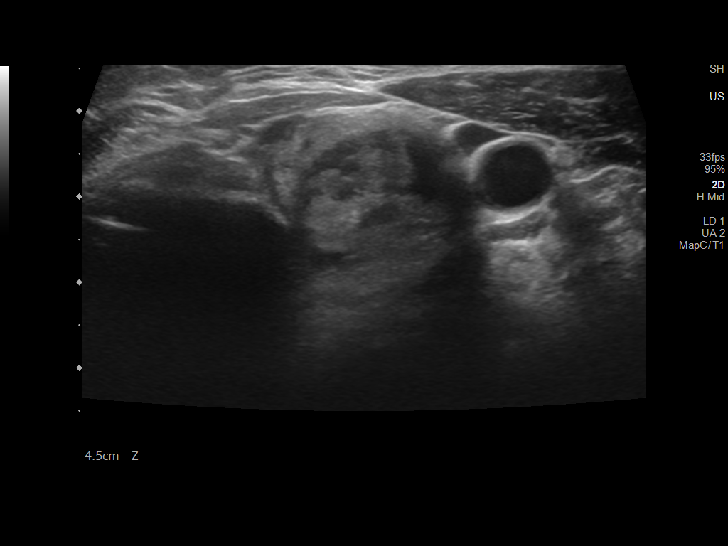
[im 14/42]
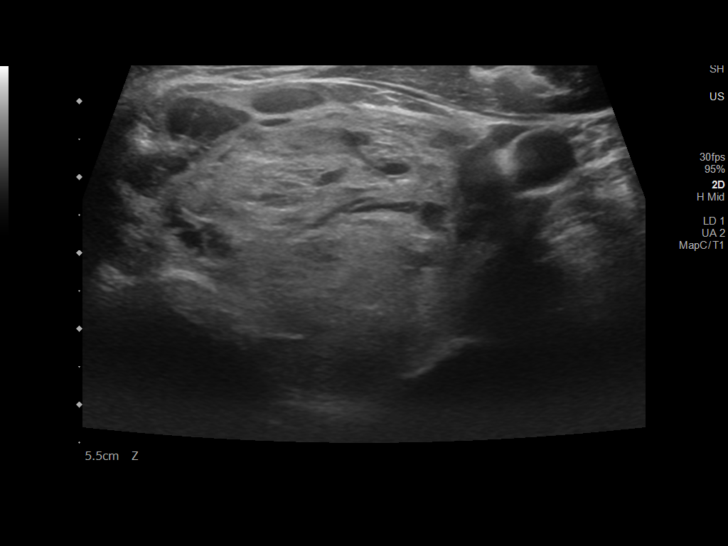
[im 18/42]
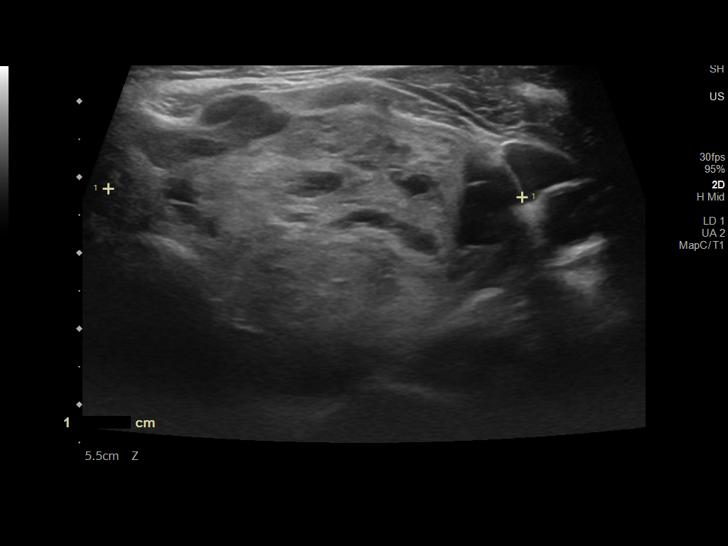
[im 21/42]
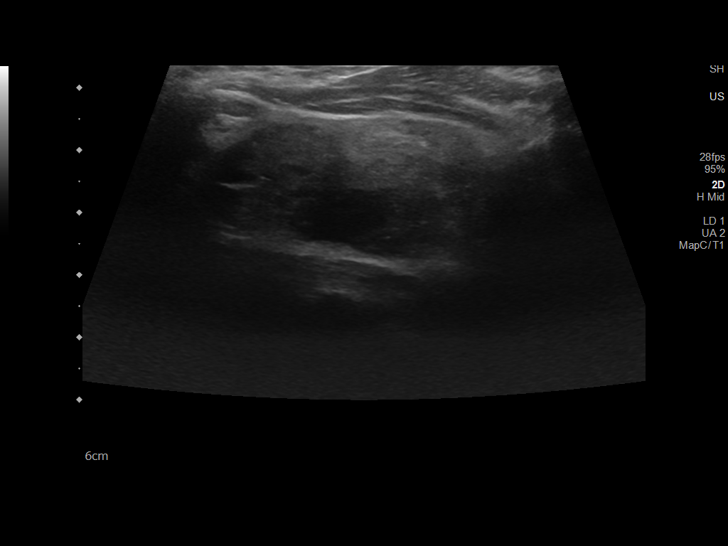
[im 24/42]
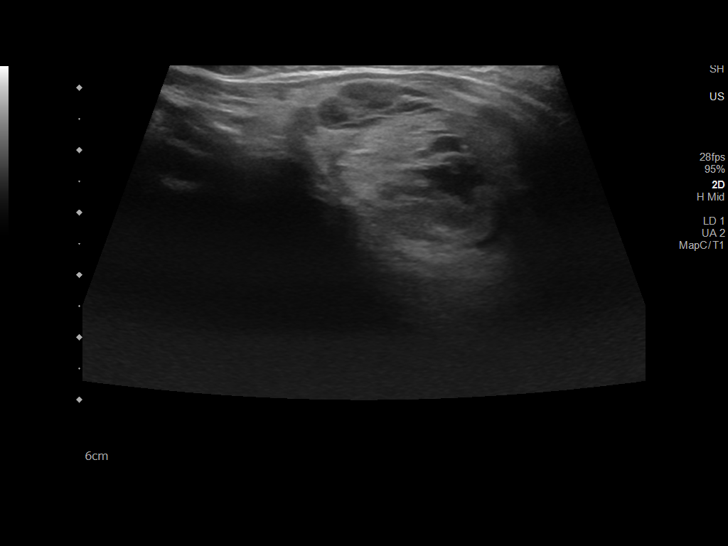
[im 28/42]
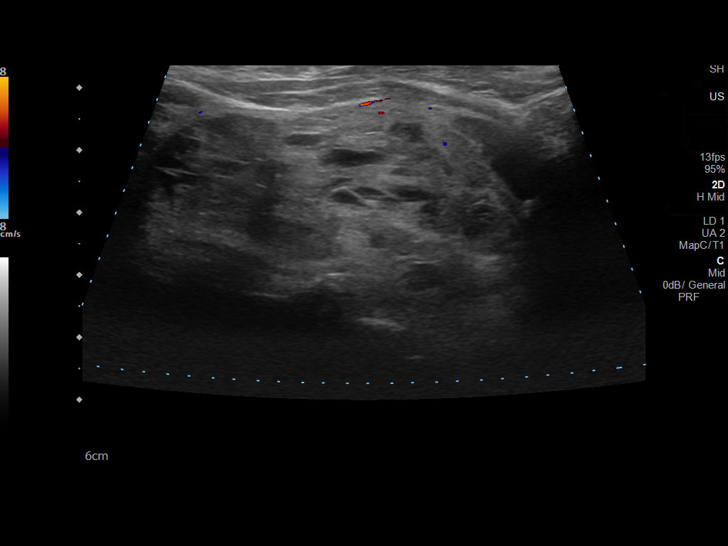
[im 31/42]
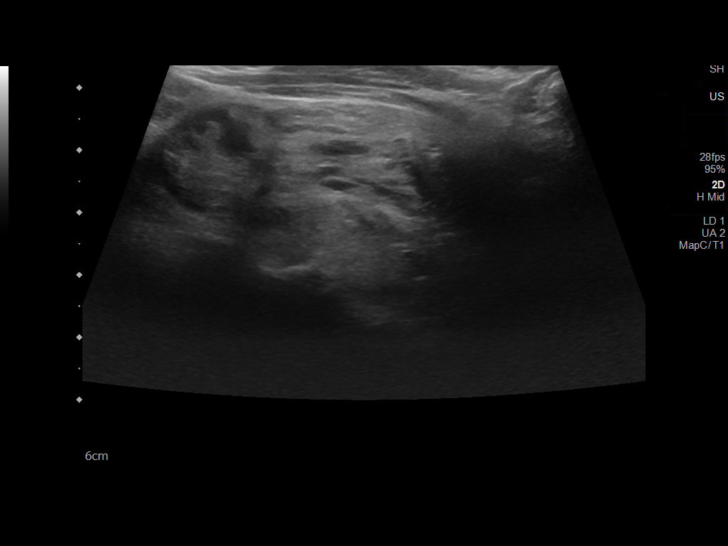
[im 35/42]
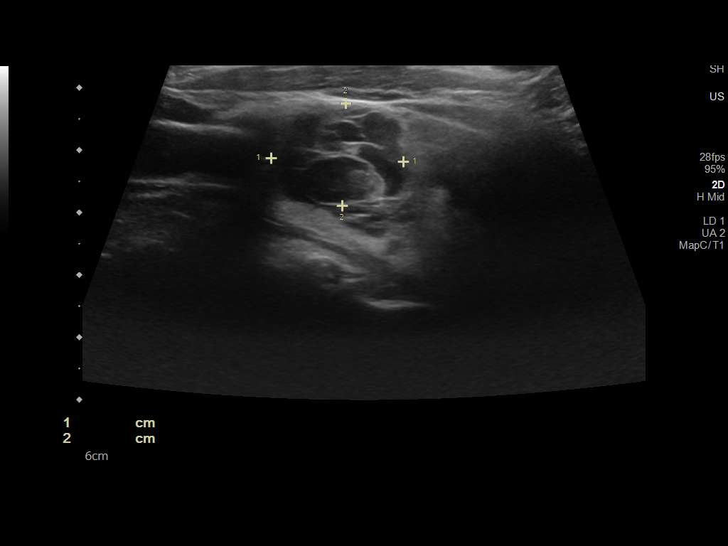
[im 38/42]
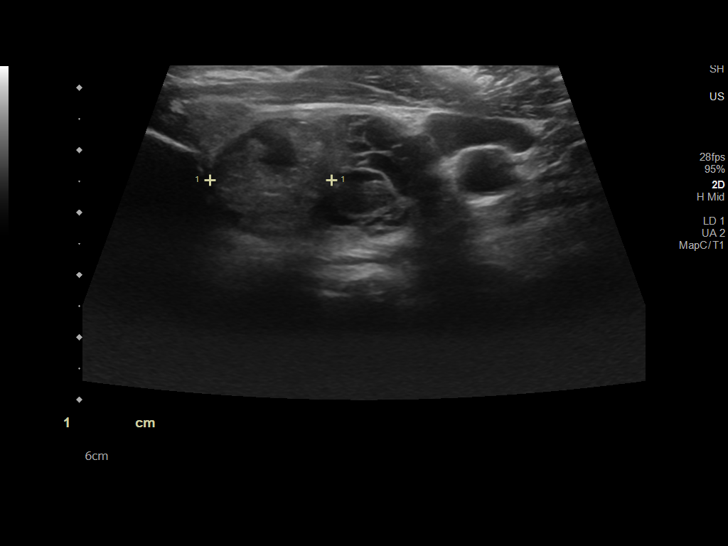
[im 42/42]
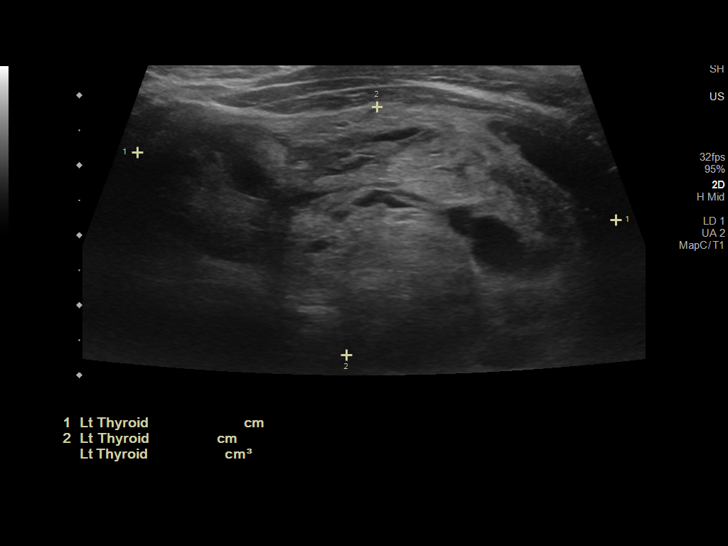

[13 of 25 positions shown; findings below may reference images not displayed]

FINDINGS: Parenchymal Echotexture: Markedly heterogenous

Isthmus: 1 cm

Right lobe: Surgically absent

Left lobe: 6.9 x 3.6 x 5.5 cm

_________________________________________________________

Estimated total number of nodules >/= 1 cm: 1

Number of spongiform nodules >/=  2 cm not described below (TR1): 0

Number of mixed cystic and solid nodules >/= 1.5 cm not described
below (TR2): 0

_________________________________________________________

The previously biopsied left thyroid TR 2 mixed cystic/solid nodule
continues to occupying the majority of the left thyroid lobe
measuring 4.7 x 3.2 x 3.9 cm, previously 4.5 x 2.0 x 3.5 cm.

There remain additional left thyroid mixed cystic/solid and
spongiform nodules also measuring 2 cm or less. These also would not
meet criteria for any biopsy or follow-up.

Remote right thyroidectomy.  No regional adenopathy.
IMPRESSION: Grossly stable previously biopsied left thyroid TR 2 nodule.

Remote right thyroidectomy

No new or enlarging thyroid abnormality.

The above is in keeping with the ACR TI-RADS recommendations - [HOSPITAL] 7146;[DATE].

## 2021-10-17 NOTE — Progress Notes (Signed)
Cardiology Office Note:    Date:  10/18/2021   ID:  [name], DOB June 04, 1951, MRN [id number]  PCP:  Darreld Mclean, MD [place] Cardiologist: Pixie Casino, MD   Reason for visit: 1 year follow-up  History of Present Illness:    [name] is a 70 y.o. female with a family history of heart disease; mother died of a sudden heart attack at age 62, her both grandparents had heart disease and a grandmother had a stroke at age 41.  She was last seen in July 2021 and was doing well with no chest pain.  She has a history of dyslipidemia, hypertension, obesity, TBI with headaches and memory loss, type 2 diabetes and colon cancer s/p resection.  Today, she states she is had a stressful year.  She gets some chest discomfort with stress.  She is walking 1 mile per day without chest pain or shortness of breath.  She is try to work back up to 2 miles a day.  She denies shortness of breath, PND, orthopnea, leg swelling.  She mentions she has worsening memory loss and often forgets to take her second dose of Coreg.  She asked for refill of her medications and plans to reset her pill pack.     Past Medical History:  Diagnosis Date   Allergy    Anemia    Anxiety    Arthritis    Asthma    Back pain    Blood transfusion without reported diagnosis    Cancer of sigmoid colon ([place])    Constipation    Depression    Diabetes mellitus without complication (HCC)    Dyspnea    with exertion   Fatigue    Fatty liver    mild   GERD (gastroesophageal reflux disease)    Grade I diastolic dysfunction    Echo 08/08/2017 with Grade I DD, EF 51%, Mild Aortic regurgitation, Mild Tricuspid Regurgitation, Pulmonary pressure 30 mm Hg. She denies any chest pain, dyspnea, or peripheral edema.   Headaches due to old head injury    History of stomach ulcers    Hyperlipidemia    Hypertension    Lactose intolerance    Left hip pain    Left thyroid nodule    Lipoma of  extremity 10/17/2017   Left lower   Migraines    Obesity    Panniculitis    TBI (traumatic brain injury)    Thyroid disease    Vitamin D deficiency     Past Surgical History:  Procedure Laterality Date   ABDOMINAL HYSTERECTOMY     ABDOMINOPLASTY/PANNICULECTOMY WITH LIPOSUCTION N/A 09/02/2020   Procedure: PANNICULECTOMY WITH LIPOSUCTION;  Surgeon: Wallace Going, DO;  Location: [facility];  Service: Plastics;  Laterality: N/A;  3 hours, please   BREAST LUMPECTOMY WITH RADIOACTIVE SEED LOCALIZATION Left 08/23/2016   Procedure: LEFT BREAST LUMPECTOMY WITH RADIOACTIVE SEED LOCALIZATION;  Surgeon: Rolm Bookbinder, MD;  Location: [place];  Service: General;  Laterality: Left;   BREAST SURGERY  2003   reduction   CESAREAN SECTION     CHALAZION EXCISION Left 2010   COLONOSCOPY W/ POLYPECTOMY  02/16/2018   Dr. Delaney Meigs JOINT INJECTION  2011 and 2012   FLEXIBLE SIGMOIDOSCOPY N/A 04/18/2018   Procedure: FLEXIBLE SIGMOIDOSCOPY;  Surgeon: Ileana Roup, MD;  Location: [facility];  Service: General;  Laterality: N/A;   LAPAROSCOPIC SIGMOID COLECTOMY N/A 04/18/2018   Procedure: LAPAROSCOPIC ASSISTED  SIGMOIDECTOMY;  Surgeon: Ileana Roup, MD;  Location: [facility];  Service: General;  Laterality: N/A;   MYOMECTOMY     NM MYOCAR PERF WALL MOTION  03/2009   dipyridamole; normal pattern of perfusion in all regions, post-stress EF 65%, normal study    THYROIDECTOMY  1989   partial   TRANSTHORACIC ECHOCARDIOGRAM  03/2009   EF=>55%, borderline conc LVH; mild mitral annular calcif, trace MR; trace TR; AV mildly sclerotic, mild AV regurg; mild pulm valve regurg    TUBAL LIGATION      Current Medications: Current Meds  Medication Sig   albuterol (VENTOLIN HFA) 108 (90 Base) MCG/ACT inhaler INHALE 2 PUFFS INTO THE LUNGS EVERY 6 HOURS AS NEEDED FOR WHEEZING OR SHORTNESS OF BREATH   butalbital-acetaminophen-caffeine (FIORICET) 50-325-40 MG tablet 1-2 po q  6 hrs prn-max 3/day   carvedilol (COREG CR) 20 MG 24 hr capsule Take 1 capsule (20 mg total) by mouth daily.   cetirizine (ZYRTEC) 10 MG tablet Take 10 mg by mouth daily as needed for allergies.   desvenlafaxine (PRISTIQ) 100 MG 24 hr tablet Take 100 mg by mouth daily.   Diclofenac Potassium,Migraine, 50 MG PACK Take 50 mg by mouth daily as needed (migraines).    diclofenac sodium (VOLTAREN) 1 % GEL Apply 2 g topically 4 (four) times daily. (Patient taking differently: Apply 2 g topically daily as needed (back pain).)   Fluocinolone Acetonide Body (DERMA-SMOOTHE/FS BODY) 0.01 % OIL Apply 1 application topically daily as needed. Leave on for 4 hours or overnight, then shampoo scalp   fluticasone (FLONASE) 50 MCG/ACT nasal spray Place 2 sprays into both nostrils daily.   frovatriptan (FROVA) 2.5 MG tablet Take 2.5 mg by mouth as needed for migraine. If recurs, may repeat after 2 hours. Max of 3 tabs in 24 hours.   gabapentin (NEURONTIN) 300 MG capsule Take 300 mg by mouth 3 (three) times daily as needed (headaches).    ibuprofen (ADVIL,MOTRIN) 200 MG tablet Take 200 mg by mouth 3 (three) times daily as needed for headache or moderate pain.   ipratropium (ATROVENT) 0.03 % nasal spray Place 2 sprays into the nose 4 (four) times daily. (Patient taking differently: Place 2 sprays into the nose 4 (four) times daily as needed for rhinitis.)   Misc. Devices MISC Patient to be out of work until next appointment follow in 8 weeks.   naproxen sodium (ALEVE) 220 MG tablet Take 220 mg by mouth daily as needed (pain).   nitroGLYCERIN (NITROSTAT) 0.4 MG SL tablet Place 1 tablet (0.4 mg total) under the tongue every 5 (five) minutes as needed.   Olopatadine HCl (PATADAY) 0.2 % SOLN Apply 1 drop daily as needed to eye (for itching).   omeprazole (PRILOSEC) 40 MG capsule Take 40 mg by mouth daily.   polyethylene glycol (MIRALAX / GLYCOLAX) packet Take 17 g by mouth daily.   sodium chloride (OCEAN) 0.65 % SOLN nasal  spray Place 1 spray into both nostrils as needed for congestion.   valACYclovir (VALTREX) 1000 MG tablet Take 1 tablet (1,000 mg total) by mouth daily. For suppression   valsartan (DIOVAN) 320 MG tablet TAKE 1 TABLET(320 MG) BY MOUTH DAILY   [DISCONTINUED] amLODipine (NORVASC) 10 MG tablet Take 1 tablet (10 mg total) by mouth daily. Pt needs to make appt with provider for further refills - 1st attempt   [DISCONTINUED] atorvastatin (LIPITOR) 40 MG tablet TAKE 1 TABLET(40 MG) BY MOUTH DAILY   [DISCONTINUED] carvedilol (COREG) 6.25 MG tablet TAKE  1 TABLET BY MOUTH TWICE DAILY WITH MEALS     Allergies:   Hydrochlorothiazide, Botox [botulinum toxin type a], Onabotulinumtoxina, Other, and Toprol xl [metoprolol succinate]   Social History   Socioeconomic History   Marital status: Married    Spouse name: Carloyn Manner   Number of children: 2   Years of education: Not on file   Highest education level: Not on file  Occupational History   Occupation: retired    Fish farm manager: [employer]  Tobacco Use   Smoking status: Never   Smokeless tobacco: Never  Vaping Use   Vaping Use: Never used  Substance and Sexual Activity   Alcohol use: Yes    Comment: occ. beer   Drug use: No   Sexual activity: Not on file  Other Topics Concern   Not on file  Social History Narrative   Not on file   Social Determinants of Health   Financial Resource Strain: Not on file  Food Insecurity: Not on file  Transportation Needs: Not on file  Physical Activity: Not on file  Stress: Not on file  Social Connections: Not on file     Family History: The patient's family history includes ADD / ADHD in her son; Anxiety disorder in her mother; Depression in her daughter; Diabetes in her father; Glaucoma in her father; Heart attack in her paternal uncle; Heart disease in her maternal grandfather, maternal grandmother, mother, paternal grandfather, and paternal grandmother; Hyperlipidemia in her mother; Hypertension in  her mother and paternal uncle; Stroke in her maternal grandfather and paternal grandmother; Sudden death in her mother; Thyroid disease in her mother and sister. There is no history of Colon cancer, Stomach cancer, Esophageal cancer, or Rectal cancer.  ROS:   Please see the history of present illness.     EKGs/Labs/Other Studies Reviewed:    EKG:  The ekg ordered today demonstrates normal sinus bradycardia, heart rate 57, PR interval 152 ms, QRS duration 82 ms.  Recent Labs: 06/16/2021: ALT 52; BUN 10; Creatinine, Ser 0.73; Hemoglobin 13.7; Platelets 266.0; Potassium 3.5; Sodium 143; TSH 0.58   Recent Lipid Panel Lab Results  Component Value Date/Time   CHOL 141 08/21/2019 08:54 AM   CHOL 150 12/03/2018 11:33 AM   CHOL 206 (H) 12/19/2013 08:20 AM   TRIG 82.0 08/21/2019 08:54 AM   TRIG 111 12/19/2013 08:20 AM   HDL 50.60 08/21/2019 08:54 AM   HDL 54 12/03/2018 11:33 AM   HDL 70 12/19/2013 08:20 AM   LDLCALC 74 08/21/2019 08:54 AM   LDLCALC 79 12/03/2018 11:33 AM   LDLCALC 114 (H) 12/19/2013 08:20 AM    Physical Exam:    VS:  BP 122/80   Pulse (!) 57   Ht 5\' 3"  (1.6 m)   Wt 213 lb (96.6 kg)   SpO2 98%   BMI 37.73 kg/m    No data found.  Wt Readings from Last 3 Encounters:  10/18/21 213 lb (96.6 kg)  06/29/21 211 lb 6.4 oz (95.9 kg)  06/16/21 213 lb (96.6 kg)     GEN:  Well nourished, well developed in no acute distress HEENT: Normal NECK: No JVD; No carotid bruits CARDIAC: RRR, no murmurs, rubs, gallops RESPIRATORY:  Clear to auscultation without rales, wheezing or rhonchi  ABDOMEN: Soft, non-tender, non-distended MUSCULOSKELETAL: No edema; No deformity  SKIN: Warm and dry NEUROLOGIC:  Alert and oriented PSYCHIATRIC:  Normal affect     ASSESSMENT AND PLAN   Hypertension, well controlled -Refill valsartan and amlodipine.  Change  Coreg 6.25 twice daily to Coreg ER 20 mg daily for easier dosing. -Goal BP is <130/80.  Recommend DASH diet (high in vegetables,  fruits, low-fat dairy products, whole grains, poultry, fish, and nuts and low in sweets, sugar-sweetened beverages, and red meats), salt restriction and increase physical activity.  Hyperlipidemia -Check fasting lipids today.  Refill Lipitor 40 mg daily. -Discussed cholesterol lowering diets - Mediterranean diet, DASH diet, vegetarian diet, low-carbohydrate diet and avoidance of trans fats.  Discussed healthier choice substitutes.  Nuts, high-fiber foods, and fiber supplements may also improve lipids.    Obesity -Discussed how even a 5-10% weight loss can have cardiovascular benefits.   -Recommend moderate intensity activity for 30 minutes 5 days/week and the DASH diet.  Disposition - Follow-up in 1 year.    Medication Adjustments/Labs and Tests Ordered: Current medicines are reviewed at length with the patient today.  Concerns regarding medicines are outlined above.  Orders Placed This Encounter  Procedures   Lipid panel   EKG 12-Lead   Meds ordered this encounter  Medications   amLODipine (NORVASC) 10 MG tablet    Sig: Take 1 tablet (10 mg total) by mouth daily. Pt needs to make appt with provider for further refills - 1st attempt    Dispense:  90 tablet    Refill:  0    Pt needs to make appt with provider for further refills - 1st attempt   atorvastatin (LIPITOR) 40 MG tablet    Sig: Take 1 tablet (40 mg total) by mouth daily.    Dispense:  90 tablet    Refill:  3   carvedilol (COREG CR) 20 MG 24 hr capsule    Sig: Take 1 capsule (20 mg total) by mouth daily.    Dispense:  90 capsule    Refill:  3    There are no Patient Instructions on file for this visit.   Signed, Warren Lacy, PA-C  10/18/2021 10:11 AM    [facility]

## 2021-10-18 ENCOUNTER — Encounter: Payer: Self-pay | Admitting: Physician Assistant

## 2021-10-18 ENCOUNTER — Ambulatory Visit: Payer: BC Managed Care – PPO | Admitting: Physician Assistant

## 2021-10-18 ENCOUNTER — Other Ambulatory Visit: Payer: Self-pay

## 2021-10-18 VITALS — BP 122/80 | HR 57 | Ht 63.0 in | Wt 213.0 lb

## 2021-10-18 DIAGNOSIS — I10 Essential (primary) hypertension: Secondary | ICD-10-CM

## 2021-10-18 DIAGNOSIS — E785 Hyperlipidemia, unspecified: Secondary | ICD-10-CM

## 2021-10-18 DIAGNOSIS — E669 Obesity, unspecified: Secondary | ICD-10-CM

## 2021-10-18 MED ORDER — AMLODIPINE BESYLATE 10 MG PO TABS
10.0000 mg | ORAL_TABLET | Freq: Every day | ORAL | 0 refills | Status: DC
Start: 2021-10-18 — End: 2022-01-28

## 2021-10-18 MED ORDER — ATORVASTATIN CALCIUM 40 MG PO TABS
40.0000 mg | ORAL_TABLET | Freq: Every day | ORAL | 3 refills | Status: DC
Start: 2021-10-18 — End: 2021-10-19

## 2021-10-18 MED ORDER — CARVEDILOL PHOSPHATE ER 20 MG PO CP24: 20.0000 mg | ORAL_CAPSULE | Freq: Every day | ORAL | 3 refills | Status: DC

## 2021-10-18 NOTE — Patient Instructions (Signed)
Medication Instructions:  Stop Coreg 6.25 mg.  Start Coreg CR 20 mg ( Take 1 Tablet Daily). *If you need a refill on your cardiac medications before your next appointment, please call your pharmacy*   Lab Work: Lipid Panel : Today If you have labs (blood work) drawn today and your tests are completely normal, you will receive your results only by: [place] (if you have MyChart) OR A paper copy in the mail If you have any lab test that is abnormal or we need to change your treatment, we will call you to review the results.   Testing/Procedures: No Testing    Follow-Up: At [facility], you and your health needs are our priority.  As part of our continuing mission to provide you with exceptional heart care, we have created designated Provider Care Teams.  These Care Teams include your primary Cardiologist (physician) and Advanced Practice Providers (APPs -  Physician Assistants and Nurse Practitioners) who all work together to provide you with the care you need, when you need it.  We recommend signing up for the patient portal called "MyChart".  Sign up information is provided on this After Visit Summary.  MyChart is used to connect with patients for Virtual Visits (Telemedicine).  Patients are able to view lab/test results, encounter notes, upcoming appointments, etc.  Non-urgent messages can be sent to your provider as well.   To learn more about what you can do with MyChart, go to [email or web address].    Your next appointment:   1 year(s)  The format for your next appointment:   In Person  Provider:   Pixie Casino, MD

## 2021-10-19 ENCOUNTER — Telehealth: Payer: Self-pay

## 2021-10-19 LAB — LIPID PANEL
Chol/HDL Ratio: 3.7 ratio (ref 0.0–4.4)
Cholesterol, Total: 190 mg/dL (ref 100–199)
HDL: 51 mg/dL (ref >39)
LDL Chol Calc (NIH): 122 mg/dL — ABNORMAL HIGH (ref 0–99)
Triglycerides: 92 mg/dL (ref 0–149)
VLDL Cholesterol Cal: 17 mg/dL (ref 5–40)

## 2021-10-19 MED ORDER — ATORVASTATIN CALCIUM 80 MG PO TABS: 80.0000 mg | ORAL_TABLET | Freq: Every day | ORAL | 3 refills | Status: DC

## 2021-10-19 NOTE — Telephone Encounter (Addendum)
Called patient regarding blood test. Advised patient per Caron Presume will increase Lipitor from 40 mg to 80 mg Daily. ----- Message from Warren Lacy, PA-C sent at 10/19/2021  9:22 AM EST ----- LDL 122.  This is up from LDL 74 in 2020. --> Recommend increasing Lipitor to 80 mg.  You can take 2 of your Lipitor 40 mg tablets until your bottle runs out.  Colletta Maryland will order you the 80 mg tablets to your pharmacy.  We can recheck fasting lipids in 3 months.  Continue increasing your exercise as tolerated & and healthy eating.   I would like to see this LDL get below 100 with your strong family history of heart disease.  I think we will be able to get there on the next check.

## 2021-11-08 ENCOUNTER — Telehealth: Payer: Self-pay | Admitting: Family Medicine

## 2021-11-08 DIAGNOSIS — B009 Herpesviral infection, unspecified: Secondary | ICD-10-CM

## 2021-11-08 MED ORDER — VALACYCLOVIR HCL 1 G PO TABS: 1000.0000 mg | ORAL_TABLET | Freq: Every day | ORAL | 2 refills | Status: DC

## 2021-11-08 NOTE — Telephone Encounter (Signed)
Pt stated pharmacy denied her refill. Medication: valACYclovir (VALTREX) 1000 MG tablet  Has the patient contacted their pharmacy? Yes.     Preferred Pharmacy: [facility] [place], [place] - [address]  [place], [place] [address]  Phone:  [phone]  Fax:  [phone]

## 2021-11-11 ENCOUNTER — Telehealth: Payer: BC Managed Care – PPO | Admitting: Physician Assistant

## 2021-11-11 DIAGNOSIS — B9689 Other specified bacterial agents as the cause of diseases classified elsewhere: Secondary | ICD-10-CM | POA: Diagnosis not present

## 2021-11-11 DIAGNOSIS — J019 Acute sinusitis, unspecified: Secondary | ICD-10-CM

## 2021-11-11 MED ORDER — AMOXICILLIN-POT CLAVULANATE 875-125 MG PO TABS: 1.0000 | ORAL_TABLET | Freq: Two times a day (BID) | ORAL | 0 refills | Status: DC

## 2021-11-11 NOTE — Progress Notes (Signed)
I have spent 5 minutes in review of e-visit questionnaire, review and updating patient chart, medical decision making and response to patient.   Naven Giambalvo Cody Akhila Mahnken, PA-C    

## 2021-11-11 NOTE — Progress Notes (Signed)

## 2021-11-15 ENCOUNTER — Other Ambulatory Visit: Payer: Self-pay | Admitting: Internal Medicine

## 2021-11-30 ENCOUNTER — Telehealth: Payer: Self-pay | Admitting: *Deleted

## 2021-11-30 ENCOUNTER — Other Ambulatory Visit: Payer: Self-pay | Admitting: *Deleted

## 2021-11-30 NOTE — Telephone Encounter (Signed)
Got message from referral coordinator that order was needed for pt.  [facility] needs an order for diagnostic mammogram and [place].   Left message with [facility] to call back.  Unsure if it is for bilateral or unilateral?

## 2021-12-01 ENCOUNTER — Encounter: Payer: Self-pay | Admitting: Family Medicine

## 2021-12-01 LAB — HM MAMMOGRAPHY

## 2021-12-23 ENCOUNTER — Other Ambulatory Visit: Payer: BC Managed Care – PPO

## 2021-12-23 ENCOUNTER — Other Ambulatory Visit (INDEPENDENT_AMBULATORY_CARE_PROVIDER_SITE_OTHER): Payer: BC Managed Care – PPO

## 2021-12-23 ENCOUNTER — Other Ambulatory Visit: Payer: Self-pay

## 2021-12-23 DIAGNOSIS — E041 Nontoxic single thyroid nodule: Secondary | ICD-10-CM | POA: Diagnosis not present

## 2021-12-23 LAB — T3, FREE: T3, Free: 3.8 pg/mL (ref 2.3–4.2)

## 2021-12-23 LAB — TSH: TSH: 0.53 u[IU]/mL (ref 0.35–5.50)

## 2021-12-23 LAB — T4, FREE: Free T4: 0.78 ng/dL (ref 0.60–1.60)

## 2021-12-30 ENCOUNTER — Ambulatory Visit: Payer: BC Managed Care – PPO | Admitting: Endocrinology

## 2022-01-07 ENCOUNTER — Ambulatory Visit: Payer: BC Managed Care – PPO | Admitting: Endocrinology

## 2022-01-07 ENCOUNTER — Encounter: Payer: Self-pay | Admitting: Endocrinology

## 2022-01-07 ENCOUNTER — Other Ambulatory Visit: Payer: Self-pay

## 2022-01-07 VITALS — BP 138/90 | HR 63 | Ht 63.0 in | Wt 211.2 lb

## 2022-01-07 DIAGNOSIS — E041 Nontoxic single thyroid nodule: Secondary | ICD-10-CM | POA: Diagnosis not present

## 2022-01-07 NOTE — Progress Notes (Signed)
[name] 71 y.o.     Reason for Appointment: Thyroid nodule, new consultation    History of Present Illness:   Baseline history: The patient's thyroid enlargement was first discovered in the late 1980s  She does not remember the details of this and this was evaluated in another state She was told to have surgery for this because of having symptoms of pressure such as swallowing difficulties. After her right thyroidectomy she was started on levothyroxine supplements These were stopped in 2016 since her TSH was low even with 25 mcg levothyroxine  She also had a thyroid biopsy on the mostly solid left lower pole nodule on 07/28/2015 which then measured 4.4 cm.  Cytology showed scant follicular epithelium and contents consistent with a thyroid cyst  RECENT history: She was seen in  follow-up consultation in 8/22 because of her noticing increasing swelling in her left lower neck for about a year  Since her last visit in 8/22 she has not noticed any change or even may be noticing less swelling She has had mild difficulty with swallowing since about 2021, not recently worse Does not complain of any choking sensation in her neck or pressure when turning her neck or when lying down.  She has been having periodic ultrasound exams on her thyroid through her PCP  Most recent ultrasound was on 06/16/2021 which showed the following   The previously biopsied left thyroid TR 2 mixed cystic/solid nodule continues to occupying the majority of the left thyroid lobe  measuring 4.7 x 3.2 x 3.9 cm, previously 4.5 x 2.0 x 3.5 cm.   There remain additional left thyroid mixed cystic/solid and spongiform nodules also measuring 2 cm or less  Lab Results  Component Value Date   FREET4 0.78 12/23/2021   FREET4 1.13 06/29/2017   FREET4 0.85 12/29/2014   TSH 0.53 12/23/2021   TSH 0.58 06/16/2021   TSH 0.36 (L) 08/24/2020      Allergies as of 01/07/2022       Reactions    Hydrochlorothiazide Other (See Comments)   Increases risk of gout flares   Botox [botulinum Toxin Type A] Swelling   Onabotulinumtoxina Swelling   Other Hives   Ubrelvy   Toprol Xl [metoprolol Succinate] Hives        Medication List        Accurate as of January 07, 2022  9:17 AM. If you have any questions, ask your nurse or doctor.          albuterol 108 (90 Base) MCG/ACT inhaler Commonly known as: VENTOLIN HFA INHALE 2 PUFFS INTO THE LUNGS EVERY 6 HOURS AS NEEDED FOR WHEEZING OR SHORTNESS OF BREATH   amLODipine 10 MG tablet Commonly known as: NORVASC Take 1 tablet (10 mg total) by mouth daily. Pt needs to make appt with provider for further refills - 1st attempt   amoxicillin-clavulanate 875-125 MG tablet Commonly known as: Augmentin Take 1 tablet by mouth 2 (two) times daily.   atorvastatin 80 MG tablet Commonly known as: LIPITOR Take 1 tablet (80 mg total) by mouth daily.   butalbital-acetaminophen-caffeine 50-325-40 MG tablet Commonly known as: FIORICET 1-2 po q 6 hrs prn-max 3/day   carvedilol 20 MG 24 hr capsule Commonly known as: Coreg CR Take 1 capsule (20 mg total) by mouth daily.   cetirizine 10 MG tablet Commonly known as: ZYRTEC Take 10 mg by mouth daily as needed for allergies.   desvenlafaxine 100 MG 24 hr tablet Commonly known as:  PRISTIQ Take 100 mg by mouth daily.   Diclofenac Potassium(Migraine) 50 MG Pack Take 50 mg by mouth daily as needed (migraines).   diclofenac sodium 1 % Gel Commonly known as: VOLTAREN Apply 2 g topically 4 (four) times daily. What changed:  when to take this reasons to take this   Fluocinolone Acetonide Body 0.01 % Oil Commonly known as: Derma-Smoothe/FS Body Apply 1 application topically daily as needed. Leave on for 4 hours or overnight, then shampoo scalp   fluticasone 50 MCG/ACT nasal spray Commonly known as: FLONASE Place 2 sprays into both nostrils daily.   frovatriptan 2.5 MG tablet Commonly  known as: FROVA Take 2.5 mg by mouth as needed for migraine. If recurs, may repeat after 2 hours. Max of 3 tabs in 24 hours.   gabapentin 300 MG capsule Commonly known as: NEURONTIN Take 300 mg by mouth 3 (three) times daily as needed (headaches).   ibuprofen 200 MG tablet Commonly known as: ADVIL Take 200 mg by mouth 3 (three) times daily as needed for headache or moderate pain.   ipratropium 0.03 % nasal spray Commonly known as: ATROVENT Place 2 sprays into the nose 4 (four) times daily. What changed:  when to take this reasons to take this   Misc. Devices Misc Patient to be out of work until next appointment follow in 8 weeks.   naproxen sodium 220 MG tablet Commonly known as: ALEVE Take 220 mg by mouth daily as needed (pain).   nitroGLYCERIN 0.4 MG SL tablet Commonly known as: NITROSTAT Place 1 tablet (0.4 mg total) under the tongue every 5 (five) minutes as needed.   Olopatadine HCl 0.2 % Soln Commonly known as: Pataday Apply 1 drop daily as needed to eye (for itching).   omeprazole 40 MG capsule Commonly known as: PRILOSEC Take 40 mg by mouth daily.   polyethylene glycol 17 g packet Commonly known as: MIRALAX / GLYCOLAX Take 17 g by mouth daily.   sodium chloride 0.65 % Soln nasal spray Commonly known as: OCEAN Place 1 spray into both nostrils as needed for congestion.   valACYclovir 1000 MG tablet Commonly known as: VALTREX Take 1 tablet (1,000 mg total) by mouth daily. For suppression   valsartan 320 MG tablet Commonly known as: DIOVAN TAKE 1 TABLET(320 MG) BY MOUTH DAILY        Allergies:  Allergies  Allergen Reactions   Hydrochlorothiazide Other (See Comments)    Increases risk of gout flares    Botox [Botulinum Toxin Type A] Swelling   Onabotulinumtoxina Swelling   Other Hives    Ubrelvy   Toprol Xl [Metoprolol Succinate] Hives    Past Medical History:  Diagnosis Date   Allergy    Anemia    Anxiety    Arthritis    Asthma    Back  pain    Blood transfusion without reported diagnosis    Cancer of sigmoid colon ([place])    Constipation    Depression    Diabetes mellitus without complication (HCC)    Dyspnea    with exertion   Fatigue    Fatty liver    mild   GERD (gastroesophageal reflux disease)    Grade I diastolic dysfunction    Echo 08/08/2017 with Grade I DD, EF 51%, Mild Aortic regurgitation, Mild Tricuspid Regurgitation, Pulmonary pressure 30 mm Hg. She denies any chest pain, dyspnea, or peripheral edema.   Headaches due to old head injury    History of stomach ulcers  Hyperlipidemia    Hypertension    Lactose intolerance    Left hip pain    Left thyroid nodule    Lipoma of extremity 10/17/2017   Left lower   Migraines    Obesity    Panniculitis    TBI (traumatic brain injury)    Thyroid disease    Vitamin D deficiency     Past Surgical History:  Procedure Laterality Date   ABDOMINAL HYSTERECTOMY     ABDOMINOPLASTY/PANNICULECTOMY WITH LIPOSUCTION N/A 09/02/2020   Procedure: PANNICULECTOMY WITH LIPOSUCTION;  Surgeon: Wallace Going, DO;  Location: [facility];  Service: Plastics;  Laterality: N/A;  3 hours, please   BREAST LUMPECTOMY WITH RADIOACTIVE SEED LOCALIZATION Left 08/23/2016   Procedure: LEFT BREAST LUMPECTOMY WITH RADIOACTIVE SEED LOCALIZATION;  Surgeon: Rolm Bookbinder, MD;  Location: [facility];  Service: General;  Laterality: Left;   BREAST SURGERY  2003   reduction   CESAREAN SECTION     CHALAZION EXCISION Left 2010   COLONOSCOPY W/ POLYPECTOMY  02/16/2018   Dr. Delaney Meigs JOINT INJECTION  2011 and 2012   FLEXIBLE SIGMOIDOSCOPY N/A 04/18/2018   Procedure: FLEXIBLE SIGMOIDOSCOPY;  Surgeon: Ileana Roup, MD;  Location: [facility];  Service: General;  Laterality: N/A;   LAPAROSCOPIC SIGMOID COLECTOMY N/A 04/18/2018   Procedure: LAPAROSCOPIC ASSISTED SIGMOIDECTOMY;  Surgeon: Ileana Roup, MD;  Location: [facility];  Service: General;   Laterality: N/A;   [place]  03/2009   dipyridamole; normal pattern of perfusion in all regions, post-stress EF 65%, normal study    THYROIDECTOMY  1989   partial   TRANSTHORACIC ECHOCARDIOGRAM  03/2009   EF=>55%, borderline conc LVH; mild mitral annular calcif, trace MR; trace TR; AV mildly sclerotic, mild AV regurg; mild pulm valve regurg    TUBAL LIGATION      Family History  Problem Relation Age of Onset   Heart disease Mother        MI, HTN   Hypertension Mother    Thyroid disease Mother    Hyperlipidemia Mother    Sudden death Mother    Anxiety disorder Mother    Heart disease Maternal Grandmother        MI, CVA   Heart disease Maternal Grandfather        MI   Stroke Maternal Grandfather    Heart disease Paternal Grandmother    Stroke Paternal Grandmother    Heart disease Paternal Grandfather        MI   Glaucoma Father    Diabetes Father    Depression Daughter    ADD / ADHD Son    Thyroid disease Sister    Heart attack Paternal Uncle        HTN   Hypertension Paternal Uncle    Colon cancer Neg Hx    Stomach cancer Neg Hx    Esophageal cancer Neg Hx    Rectal cancer Neg Hx     Social History:  reports that she has never smoked. She has never used smokeless tobacco. She reports current alcohol use. She reports that she does not use drugs.  ROS  She has been told to have prediabetes because of an A1c of 5.9 but recent glucose was 80  On treatment for hypertension   Examination:   BP 138/90    Pulse 63    Ht 5\' 3"  (1.6 m)    Wt 211 lb 3.2 oz (  95.8 kg)    SpO2 96%    BMI 37.41 kg/m           THYROID:  This is enlarged mostly on the left side extending to the isthmus, relatively firm and smooth. Horizontally the nodule measures 8 cm across Neck circumference is 40.5 cm Thyroid not palpable on the left side Trachea may be slightly deviated to the right There is no stridor. Pemberton sign is negative      Assessment/Plan:  MULTINODULAR goiter:  Long-standing history of multinodular goiter treated previously with right thyroidectomy for benign nodule likely Also has a strong family history of goiter  Most of her thyroid enlargement on the left is related to a complex cystic/solid nodule which was benign on biopsy previously This appears to be minimally increased since 2016 with previous measurement 4.4 cm and last 4.7 cm  Clinically no change since her last visit about 6 months ago Also no local pressure symptoms related to thyroid enlargement  Thyroid levels are again normal with TSH about 0.5  With thyroid enlargement being stable she can come back annually now for follow-up  Elayne Snare 01/07/2022

## 2022-01-28 ENCOUNTER — Other Ambulatory Visit: Payer: Self-pay | Admitting: Physician Assistant

## 2022-03-03 ENCOUNTER — Encounter: Payer: Self-pay | Admitting: Gastroenterology

## 2022-04-28 ENCOUNTER — Ambulatory Visit (AMBULATORY_SURGERY_CENTER): Payer: BC Managed Care – PPO | Admitting: *Deleted

## 2022-04-28 VITALS — Ht 63.0 in | Wt 208.0 lb

## 2022-04-28 DIAGNOSIS — Z85038 Personal history of other malignant neoplasm of large intestine: Secondary | ICD-10-CM

## 2022-04-28 MED ORDER — PEG 3350-KCL-NA BICARB-NACL 420 G PO SOLR: 4000.0000 mL | Freq: Once | ORAL | 0 refills | Status: AC

## 2022-04-28 NOTE — Progress Notes (Signed)
No egg or soy allergy known to patient  No issues known to pt with past sedation with any surgeries or procedures Patient denies ever being told they had issues or difficulty with intubation  No FH of Malignant Hyperthermia Pt is not on diet pills Pt is not on  home 02  Pt is not on blood thinners  Pt denies issues with constipation unless taking a lot of medication, uses Miralax daily.Encouraged to continue taking prior to procedure. No A fib or A flutter   PV completed over the phone. Pt verified name, DOB, address and insurance during PV today.  Pt encouraged to call with questions or issues.  If pt has My chart, procedure instructions sent via My Chart  Insurance confirmed with pt at [facility] today

## 2022-05-13 ENCOUNTER — Encounter: Payer: Self-pay | Admitting: Gastroenterology

## 2022-05-17 ENCOUNTER — Encounter: Payer: Self-pay | Admitting: Gastroenterology

## 2022-05-17 ENCOUNTER — Ambulatory Visit (AMBULATORY_SURGERY_CENTER): Payer: BC Managed Care – PPO | Admitting: Gastroenterology

## 2022-05-17 VITALS — BP 132/63 | HR 46 | Temp 96.9°F | Resp 11 | Ht 63.0 in | Wt 208.0 lb

## 2022-05-17 DIAGNOSIS — Z09 Encounter for follow-up examination after completed treatment for conditions other than malignant neoplasm: Secondary | ICD-10-CM | POA: Diagnosis not present

## 2022-05-17 DIAGNOSIS — Z86010 Personal history of colonic polyps: Secondary | ICD-10-CM

## 2022-05-17 DIAGNOSIS — Z85038 Personal history of other malignant neoplasm of large intestine: Secondary | ICD-10-CM

## 2022-05-17 MED ORDER — SODIUM CHLORIDE 0.9 % IV SOLN: 500.0000 mL | Freq: Once | INTRAVENOUS | Status: DC

## 2022-05-17 NOTE — Progress Notes (Signed)
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 03/2018 segmental colectomy showed no residual cancer. Colonoscopy 04/2019 27m non-precancerous polyp removed; colo-colonic anastomosis was normal.   HPI: This is a womabn with h/o colon cancer   ROS: complete GI ROS as described in HPI, all other review negative.  Constitutional:  No unintentional weight loss   Past Medical History:  Diagnosis Date   Allergy    Anemia    Anxiety    Arthritis    Asthma    Back pain    Blood transfusion without reported diagnosis    [place]    Constipation    Depression    Diabetes mellitus without complication (HCC)    Dyspnea    with exertion   Fatigue    Fatty liver    mild   GERD (gastroesophageal reflux disease)    Grade I diastolic dysfunction    Echo 08/08/2017 with Grade I DD, EF 51%, Mild Aortic regurgitation, Mild Tricuspid Regurgitation, Pulmonary pressure 30 mm Hg. She denies any chest pain, dyspnea, or peripheral edema.   Headaches due to old head injury    History of stomach ulcers    Hyperlipidemia    Hypertension    Lactose intolerance    Left hip pain    Left thyroid nodule    Lipoma of extremity 10/17/2017   Left lower   Migraines    Obesity    Panniculitis    [place]    Thyroid disease    Vitamin D deficiency     Past Surgical History:  Procedure Laterality Date   ABDOMINAL HYSTERECTOMY     ABDOMINOPLASTY/PANNICULECTOMY WITH LIPOSUCTION N/A 09/02/2020   Procedure: PANNICULECTOMY WITH LIPOSUCTION;  Surgeon: DWallace Going DO;  [place]  Service: Plastics;  Laterality: N/A;  3 hours, please   BREAST LUMPECTOMY WITH RADIOACTIVE SEED LOCALIZATION Left 08/23/2016   Procedure: LEFT BREAST LUMPECTOMY WITH RADIOACTIVE SEED LOCALIZATION;  Surgeon: MRolm Bookbinder MD;  [place]  Service: General;   Laterality: Left;   BREAST SURGERY  2003   reduction   CESAREAN SECTION     CHALAZION EXCISION Left 2010   COLONOSCOPY W/ POLYPECTOMY  02/16/2018   Dr. JDelaney MeigsJOINT INJECTION  2011 and 2012   FLEXIBLE SIGMOIDOSCOPY N/A 04/18/2018   Procedure: FLEXIBLE SIGMOIDOSCOPY;  Surgeon: WIleana Roup MD;  [facility];  Service: General;  Laterality: N/A;   LAPAROSCOPIC SIGMOID COLECTOMY N/A 04/18/2018   Procedure: LAPAROSCOPIC ASSISTED SIGMOIDECTOMY;  Surgeon: WIleana Roup MD;  [facility];  Service: General;  Laterality: N/A;   MYOMECTOMY     [place] [facility] 03/2009   dipyridamole; normal pattern of perfusion in all regions, post-stress EF 65%, normal study    THYROIDECTOMY  1989   partial   TRANSTHORACIC ECHOCARDIOGRAM  03/2009   EF=>55%, borderline conc LVH; mild mitral annular calcif, trace MR; trace TR; AV mildly sclerotic, mild AV regurg; mild pulm valve regurg    TUBAL LIGATION      Current Outpatient Medications  Medication Sig Dispense Refill   amLODipine (NORVASC) 10 MG tablet Take 1 tablet (10 mg total) by mouth daily. 90 tablet 2   carvedilol (COREG CR) 20 MG 24 hr capsule Take 1 capsule (20 mg total) by mouth daily. 90 capsule 3   Cholecalciferol (VITAMIN D3) 1.25 MG (50000 UT) CAPS Take 1  capsule by mouth once a week.     desvenlafaxine (PRISTIQ) 100 MG 24 hr tablet Take 100 mg by mouth daily.     omeprazole (PRILOSEC) 40 MG capsule Take 40 mg by mouth daily.     polyethylene glycol (MIRALAX / GLYCOLAX) packet Take 17 g by mouth daily.     valACYclovir (VALTREX) 1000 MG tablet Take 1 tablet (1,000 mg total) by mouth daily. For suppression 90 tablet 2   valsartan (DIOVAN) 320 MG tablet TAKE 1 TABLET(320 MG) BY MOUTH DAILY 90 tablet 3   albuterol (VENTOLIN HFA) 108 (90 Base) MCG/ACT inhaler INHALE 2 PUFFS INTO THE LUNGS EVERY 6 HOURS AS NEEDED FOR WHEEZING OR SHORTNESS OF BREATH 54 g 1   amoxicillin-clavulanate (AUGMENTIN) 875-125 MG tablet  Take 1 tablet by mouth 2 (two) times daily. (Patient not taking: Reported on 01/07/2022) 14 tablet 0   atorvastatin (LIPITOR) 80 MG tablet Take 1 tablet (80 mg total) by mouth daily. 90 tablet 3   butalbital-acetaminophen-caffeine (FIORICET) 50-325-40 MG tablet 1-2 po q 6 hrs prn-max 3/day     cetirizine (ZYRTEC) 10 MG tablet Take 10 mg by mouth daily as needed for allergies.     Diclofenac Potassium,Migraine, 50 MG PACK Take 50 mg by mouth daily as needed (migraines).      diclofenac sodium (VOLTAREN) 1 % GEL Apply 2 g topically 4 (four) times daily. (Patient not taking: Reported on 04/28/2022) 100 g 3   Fluocinolone Acetonide Body (DERMA-SMOOTHE/FS BODY) 0.01 % OIL Apply 1 application topically daily as needed. Leave on for 4 hours or overnight, then shampoo scalp 118.28 mL 2   fluticasone (FLONASE) 50 MCG/ACT nasal spray Place 2 sprays into both nostrils daily. 16 g 0   frovatriptan (FROVA) 2.5 MG tablet Take 2.5 mg by mouth as needed for migraine. If recurs, may repeat after 2 hours. Max of 3 tabs in 24 hours.     gabapentin (NEURONTIN) 300 MG capsule Take 300 mg by mouth 3 (three) times daily as needed (headaches).      ibuprofen (ADVIL,MOTRIN) 200 MG tablet Take 200 mg by mouth 3 (three) times daily as needed for headache or moderate pain.     ipratropium (ATROVENT) 0.03 % nasal spray Place 2 sprays into the nose 4 (four) times daily. (Patient taking differently: Place 2 sprays into the nose 4 (four) times daily as needed for rhinitis.) 30 mL 1   meloxicam (MOBIC) 7.5 MG tablet Take 7.5 mg by mouth 3 (three) times daily.     Misc. Devices MISC Patient to be out of work until next appointment follow in 8 weeks.     naproxen sodium (ALEVE) 220 MG tablet Take 220 mg by mouth daily as needed (pain).     nitroGLYCERIN (NITROSTAT) 0.4 MG SL tablet Place 1 tablet (0.4 mg total) under the tongue every 5 (five) minutes as needed. (Patient not taking: Reported on 01/07/2022) 30 tablet 0   OLANZapine  (ZYPREXA) 5 MG tablet Take 5 mg by mouth at bedtime.     Olopatadine HCl (PATADAY) 0.2 % SOLN Apply 1 drop daily as needed to eye (for itching). 2.5 mL 9   sodium chloride (OCEAN) 0.65 % SOLN nasal spray Place 1 spray into both nostrils as needed for congestion. (Patient not taking: Reported on 01/07/2022) 480 mL 0   Current Facility-Administered Medications  Medication Dose Route Frequency Provider Last Rate Last Admin   0.9 %  sodium chloride infusion  500 mL Intravenous Once Milus Banister,  MD        Allergies as of 05/17/2022 - Review Complete 05/17/2022  Allergen Reaction Noted   Hydrochlorothiazide Other (See Comments) 03/05/2015   Botox [botulinum toxin type a] Swelling 01/31/2013   Onabotulinumtoxina Swelling 06/10/2015   Other Hives 06/16/2021   Toprol xl [metoprolol succinate] Hives 01/18/2012    Family History  Problem Relation Age of Onset   Heart disease Mother        MI, HTN   Hypertension Mother    Thyroid disease Mother    Hyperlipidemia Mother    Sudden death Mother    Anxiety disorder Mother    Glaucoma Father    Diabetes Father    Thyroid disease Sister    Heart attack Paternal Uncle        HTN   Hypertension Paternal Uncle    Heart disease Maternal Grandmother        MI, CVA   Heart disease Maternal Grandfather        MI   Stroke Maternal Grandfather    Heart disease Paternal Grandmother    Stroke Paternal Grandmother    Heart disease Paternal Grandfather        MI   Depression Daughter    ADD / ADHD Son    Colon cancer Neg Hx    Stomach cancer Neg Hx    Esophageal cancer Neg Hx    Rectal cancer Neg Hx    Colon polyps Neg Hx     Social History   Socioeconomic History   Marital status: Married    Spouse name: Carloyn Manner   Number of children: 2   Years of education: Not on file   Highest education level: Not on file  Occupational History   Occupation: retired    Fish farm manager: [employer]  Tobacco Use   Smoking status: Never    Smokeless tobacco: Never  Vaping Use   Vaping Use: Never used  Substance and Sexual Activity   Alcohol use: Yes    Comment: occ. beer   Drug use: No   Sexual activity: Not on file  Other Topics Concern   Not on file  Social History Narrative   Not on file   Social Determinants of Health   Financial Resource Strain: Not on file  Food Insecurity: Not on file  Transportation Needs: Not on file  Physical Activity: Not on file  Stress: Not on file  Social Connections: Not on file  Intimate Partner Violence: Not on file     Physical Exam: BP (!) 144/75   Pulse (!) 59   Temp (!) 96.9 F (36.1 C)   Ht '5\' 3"'$  (1.6 m)   Wt 208 lb (94.3 kg)   SpO2 97%   BMI 36.85 kg/m  Constitutional: generally well-appearing Psychiatric: alert and oriented x3 Lungs: CTA bilaterally Heart: no MCR  Assessment and plan: 71 y.o. female with h/o colon cancer  Surveillance colonosxopy today  Care is appropriate for the ambulatory setting.  Owens Loffler, MD [facility] 05/17/2022, 9:12 AM

## 2022-05-17 NOTE — Progress Notes (Signed)
A and O x3. Report to RN. Tolerated MAC anesthesia well. 

## 2022-05-17 NOTE — Op Note (Signed)
[facility] Patient Name: [name] Procedure Date: 05/17/2022 9:03 AM MRN: [id number] Endoscopist: Milus Banister , MD Age: 71 Referring MD:  Date of Birth: [birth year] Gender: Female Account #: [id number] Procedure:                Colonoscopy Indications:              High risk colon cancer surveillance: Personal                            history of colon cancer; Colonoscopy 01/2018                            cancerous polyp removed from sigmoid colon, path                            showed invasive adenocarcinoma to the margin.                            03/2018 segmental colectomy showed no residual                            cancer. Colonoscopy 04/2019 64m non-precancerous                            polyp removed; colo-colonic anastomosis was normal Medicines:                Monitored Anesthesia Care Procedure:                Pre-Anesthesia Assessment:                           - Prior to the procedure, a History and Physical                            was performed, and patient medications and                            allergies were reviewed. The patient's tolerance of                            previous anesthesia was also reviewed. The risks                            and benefits of the procedure and the sedation                            options and risks were discussed with the patient.                            All questions were answered, and informed consent                            was obtained. Prior Anticoagulants: The patient has  taken no previous anticoagulant or antiplatelet                            agents. ASA Grade Assessment: II - A patient with                            mild systemic disease. After reviewing the risks                            and benefits, the patient was deemed in                            satisfactory condition to undergo the procedure.                           After obtaining  informed consent, the colonoscope                            was passed under direct vision. Throughout the                            procedure, the patient's blood pressure, pulse, and                            oxygen saturations were monitored continuously. The                            Olympus PCF-H190DL ([id number]) Colonoscope was                            introduced through the anus and advanced to the the                            cecum, identified by appendiceal orifice and                            ileocecal valve. The colonoscopy was performed                            without difficulty. The patient tolerated the                            procedure well. The quality of the bowel                            preparation was good. The ileocecal valve,                            appendiceal orifice, and rectum were photographed. Scope In: 9:17:58 AM Scope Out: 9:26:42 AM Scope Withdrawal Time: 0 hours 5 minutes 32 seconds  Total Procedure Duration: 0 hours 8 minutes 44 seconds  Findings:                 Normal colo-colonic anastomosis in the distal  sigmoid region.                           The entire examined colon was otherwise normal on                            direct and retroflexion views. Complications:            No immediate complications. Estimated blood loss:                            None. Estimated Blood Loss:     Estimated blood loss: none. Impression:               - The entire examined colon is normal on direct and                            retroflexion views.                           - Normal colo-colonic anastomosis in the distal                            sigmoid region. Recommendation:           - Patient has a contact number available for                            emergencies. The signs and symptoms of potential                            delayed complications were discussed with the                            patient.  Return to normal activities tomorrow.                            Written discharge instructions were provided to the                            patient.                           - Resume previous diet.                           - Continue present medications.                           - Repeat colonoscopy in 5 years for surveillance. Milus Banister, MD 05/17/2022 9:30:26 AM This report has been signed electronically.

## 2022-05-17 NOTE — Patient Instructions (Signed)
YOU HAD AN ENDOSCOPIC PROCEDURE TODAY AT THE  ENDOSCOPY CENTER:   Refer to the procedure report that was given to you for any specific questions about what was found during the examination.  If the procedure report does not answer your questions, please call your gastroenterologist to clarify.  If you requested that your care partner not be given the details of your procedure findings, then the procedure report has been included in a sealed envelope for you to review at your convenience later.  YOU SHOULD EXPECT: Some feelings of bloating in the abdomen. Passage of more gas than usual.  Walking can help get rid of the air that was put into your GI tract during the procedure and reduce the bloating. If you had a lower endoscopy (such as a colonoscopy or flexible sigmoidoscopy) you may notice spotting of blood in your stool or on the toilet paper. If you underwent a bowel prep for your procedure, you may not have a normal bowel movement for a few days.  Please Note:  You might notice some irritation and congestion in your nose or some drainage.  This is from the oxygen used during your procedure.  There is no need for concern and it should clear up in a day or so.  SYMPTOMS TO REPORT IMMEDIATELY:  Following lower endoscopy (colonoscopy or flexible sigmoidoscopy):  Excessive amounts of blood in the stool  Significant tenderness or worsening of abdominal pains  Swelling of the abdomen that is new, acute  Fever of 100F or higher  For urgent or emergent issues, a gastroenterologist can be reached at any hour by calling [phone]. Do not use MyChart messaging for urgent concerns.    DIET:  We do recommend a small meal at first, but then you may proceed to your regular diet.  Drink plenty of fluids but you should avoid alcoholic beverages for 24 hours.  ACTIVITY:  You should plan to take it easy for the rest of today and you should NOT DRIVE or use heavy machinery until tomorrow (because of  the sedation medicines used during the test).    FOLLOW UP: Our staff will call the number listed on your records 24-72 hours following your procedure to check on you and address any questions or concerns that you may have regarding the information given to you following your procedure. If we do not reach you, we will leave a message.  We will attempt to reach you two times.  During this call, we will ask if you have developed any symptoms of COVID 19. If you develop any symptoms (ie: fever, flu-like symptoms, shortness of breath, cough etc.) before then, please call [phone].  If you test positive for Covid 19 in the 2 weeks post procedure, please call and report this information to us.     SIGNATURES/CONFIDENTIALITY: You and/or your care partner have signed paperwork which will be entered into your electronic medical record.  These signatures attest to the fact that that the information above on your After Visit Summary has been reviewed and is understood.  Full responsibility of the confidentiality of this discharge information lies with you and/or your care-partner.  

## 2022-05-17 NOTE — Progress Notes (Signed)
Pt's states no medical or surgical changes since previsit or office visit. 

## 2022-05-18 ENCOUNTER — Telehealth: Payer: Self-pay

## 2022-05-18 NOTE — Telephone Encounter (Signed)
  Follow up Call-     05/17/2022    8:48 AM  Call back number  Post procedure Call Back phone  # [phone]  Permission to leave phone message Yes     Patient questions:  Do you have a fever, pain , or abdominal swelling? No. Pain Score  0 *  Have you tolerated food without any problems? Yes.    Have you been able to return to your normal activities? Yes.    Do you have any questions about your discharge instructions: Diet   No. Medications  No. Follow up visit  No.  Do you have questions or concerns about your Care? No.  Actions: * If pain score is 4 or above: No action needed, pain <4.

## 2022-05-23 ENCOUNTER — Ambulatory Visit (INDEPENDENT_AMBULATORY_CARE_PROVIDER_SITE_OTHER): Payer: BC Managed Care – PPO | Admitting: Family Medicine

## 2022-05-23 VITALS — BP 130/80 | HR 60 | Temp 97.8°F | Resp 18 | Ht 63.0 in | Wt 209.6 lb

## 2022-05-23 DIAGNOSIS — L680 Hirsutism: Secondary | ICD-10-CM | POA: Diagnosis not present

## 2022-05-23 DIAGNOSIS — E785 Hyperlipidemia, unspecified: Secondary | ICD-10-CM | POA: Diagnosis not present

## 2022-05-23 DIAGNOSIS — R7401 Elevation of levels of liver transaminase levels: Secondary | ICD-10-CM | POA: Diagnosis not present

## 2022-05-23 DIAGNOSIS — I10 Essential (primary) hypertension: Secondary | ICD-10-CM | POA: Diagnosis not present

## 2022-05-23 DIAGNOSIS — L309 Dermatitis, unspecified: Secondary | ICD-10-CM

## 2022-05-23 DIAGNOSIS — R7303 Prediabetes: Secondary | ICD-10-CM | POA: Diagnosis not present

## 2022-05-23 MED ORDER — FLUOCINOLONE ACETONIDE BODY 0.01 % EX OIL: TOPICAL_OIL | CUTANEOUS | 2 refills | Status: AC

## 2022-05-24 ENCOUNTER — Encounter: Payer: Self-pay | Admitting: Family Medicine

## 2022-05-24 LAB — COMPREHENSIVE METABOLIC PANEL
ALT: 25 U/L (ref 0–35)
AST: 20 U/L (ref 0–37)
Albumin: 3.8 g/dL (ref 3.5–5.2)
Alkaline Phosphatase: 75 U/L (ref 39–117)
BUN: 9 mg/dL (ref 6–23)
CO2: 32 mEq/L (ref 19–32)
Calcium: 9.1 mg/dL (ref 8.4–10.5)
Chloride: 104 mEq/L (ref 96–112)
Creatinine, Ser: 0.79 mg/dL (ref 0.40–1.20)
GFR: 76.23 mL/min (ref >60.00)
Glucose, Bld: 79 mg/dL (ref 70–99)
Potassium: 3.6 mEq/L (ref 3.5–5.1)
Sodium: 142 mEq/L (ref 135–145)
Total Bilirubin: 0.6 mg/dL (ref 0.2–1.2)
Total Protein: 6.5 g/dL (ref 6.0–8.3)

## 2022-05-24 LAB — LIPID PANEL
Cholesterol: 137 mg/dL (ref 0–200)
HDL: 50.8 mg/dL (ref >39.00)
LDL Cholesterol: 71 mg/dL (ref 0–99)
NonHDL: 85.9
Total CHOL/HDL Ratio: 3
Triglycerides: 77 mg/dL (ref 0.0–149.0)
VLDL: 15.4 mg/dL (ref 0.0–40.0)

## 2022-05-24 LAB — CBC
HCT: 37.7 % (ref 36.0–46.0)
Hemoglobin: 12.6 g/dL (ref 12.0–15.0)
MCHC: 33.5 g/dL (ref 30.0–36.0)
MCV: 89 fl (ref 78.0–100.0)
Platelets: 237 10*3/uL (ref 150.0–400.0)
RBC: 4.23 Mil/uL (ref 3.87–5.11)
RDW: 13.7 % (ref 11.5–15.5)
WBC: 4.3 10*3/uL (ref 4.0–10.5)

## 2022-05-24 LAB — TSH: TSH: 0.74 u[IU]/mL (ref 0.35–5.50)

## 2022-05-24 LAB — PROLACTIN: Prolactin: 5.2 ng/mL

## 2022-05-24 LAB — TESTOSTERONE: Testosterone: 223.92 ng/dL — ABNORMAL HIGH (ref 15.00–40.00)

## 2022-05-24 LAB — DHEA-SULFATE: DHEA-SO4: 21 ug/dL (ref 9–118)

## 2022-05-24 LAB — HEMOGLOBIN A1C: Hgb A1c MFr Bld: 5.9 % (ref 4.6–6.5)

## 2022-06-07 ENCOUNTER — Encounter: Payer: Self-pay | Admitting: Endocrinology

## 2022-06-07 ENCOUNTER — Ambulatory Visit: Payer: BC Managed Care – PPO | Admitting: Endocrinology

## 2022-06-07 VITALS — BP 146/82 | HR 77 | Ht 63.0 in | Wt 209.8 lb

## 2022-06-07 DIAGNOSIS — E288 Other ovarian dysfunction: Secondary | ICD-10-CM | POA: Diagnosis not present

## 2022-06-07 DIAGNOSIS — R7989 Other specified abnormal findings of blood chemistry: Secondary | ICD-10-CM

## 2022-06-07 DIAGNOSIS — L680 Hirsutism: Secondary | ICD-10-CM | POA: Diagnosis not present

## 2022-06-07 NOTE — Progress Notes (Signed)
Patient ID: [name], female   DOB: Aug 13, 1951, 71 y.o.   MRN: [id number]           Chief complaint: Excessive body hair  History of Present Illness:  For the last 6 months or so she has noticed increasing body hair Initially this was noticed by her daughter who was checking her for a possible rash and noticed bloody hair She also has noticed presence of hair on her trunk anteriorly also She has had longstanding facial hair which she is getting treated with electrolysis No hair loss Acne not present  She also thinks that her clitoris is enlarged She has not had any change in her appetite, weight, mood or sleep  Testosterone level done recently is 224, normal less than 40  Previous studies: CT scan of her abdomen in 01/2018 did not show any ovarian or adrenal abnormality   Allergies as of 06/07/2022       Reactions   Hydrochlorothiazide Other (See Comments)   Increases risk of gout flares   Botox [botulinum Toxin Type A] Swelling   Onabotulinumtoxina Swelling   Other Hives   Ubrelvy   Toprol Xl [metoprolol Succinate] Hives        Medication List        Accurate as of June 07, 2022  1:29 PM. If you have any questions, ask your nurse or doctor.          albuterol 108 (90 Base) MCG/ACT inhaler Commonly known as: VENTOLIN HFA INHALE 2 PUFFS INTO THE LUNGS EVERY 6 HOURS AS NEEDED FOR WHEEZING OR SHORTNESS OF BREATH   amLODipine 10 MG tablet Commonly known as: NORVASC Take 1 tablet (10 mg total) by mouth daily.   atorvastatin 80 MG tablet Commonly known as: LIPITOR Take 1 tablet (80 mg total) by mouth daily.   butalbital-acetaminophen-caffeine 50-325-40 MG tablet Commonly known as: FIORICET 1-2 po q 6 hrs prn-max 3/day   carvedilol 20 MG 24 hr capsule Commonly known as: Coreg CR Take 1 capsule (20 mg total) by mouth daily.   cetirizine 10 MG tablet Commonly known as: ZYRTEC Take 10 mg by mouth daily as needed for allergies.   desvenlafaxine  100 MG 24 hr tablet Commonly known as: PRISTIQ Take 100 mg by mouth daily.   Diclofenac Potassium(Migraine) 50 MG Pack Take 50 mg by mouth daily as needed (migraines).   diclofenac sodium 1 % Gel Commonly known as: VOLTAREN Apply 2 g topically 4 (four) times daily.   Fluocinolone Acetonide Body 0.01 % Oil Commonly known as: Derma-Smoothe/FS Body Apply to scalp and leave on 8 hours or overnight as needed for eczema   fluticasone 50 MCG/ACT nasal spray Commonly known as: FLONASE Place 2 sprays into both nostrils daily.   frovatriptan 2.5 MG tablet Commonly known as: FROVA Take 2.5 mg by mouth as needed for migraine. If recurs, may repeat after 2 hours. Max of 3 tabs in 24 hours.   gabapentin 300 MG capsule Commonly known as: NEURONTIN Take 300 mg by mouth 3 (three) times daily as needed (headaches).   ibuprofen 200 MG tablet Commonly known as: ADVIL Take 200 mg by mouth 3 (three) times daily as needed for headache or moderate pain.   ipratropium 0.03 % nasal spray Commonly known as: ATROVENT Place 2 sprays into the nose 4 (four) times daily. What changed:  when to take this reasons to take this   meloxicam 7.5 MG tablet Commonly known as: MOBIC Take 7.5 mg by mouth 3 (  three) times daily.   Misc. Devices Misc Patient to be out of work until next appointment follow in 8 weeks.   naproxen sodium 220 MG tablet Commonly known as: ALEVE Take 220 mg by mouth daily as needed (pain).   nitroGLYCERIN 0.4 MG SL tablet Commonly known as: NITROSTAT Place 1 tablet (0.4 mg total) under the tongue every 5 (five) minutes as needed.   OLANZapine 5 MG tablet Commonly known as: ZYPREXA Take 5 mg by mouth at bedtime.   Olopatadine HCl 0.2 % Soln Commonly known as: Pataday Apply 1 drop daily as needed to eye (for itching).   omeprazole 40 MG capsule Commonly known as: PRILOSEC Take 40 mg by mouth daily.   polyethylene glycol 17 g packet Commonly known as: MIRALAX /  GLYCOLAX Take 17 g by mouth daily.   valACYclovir 1000 MG tablet Commonly known as: VALTREX Take 1 tablet (1,000 mg total) by mouth daily. For suppression   valsartan 320 MG tablet Commonly known as: DIOVAN TAKE 1 TABLET(320 MG) BY MOUTH DAILY   Vitamin D3 1.25 MG (50000 UT) Caps Take 1 capsule by mouth once a week.        Allergies:  Allergies  Allergen Reactions   Hydrochlorothiazide Other (See Comments)    Increases risk of gout flares    Botox [Botulinum Toxin Type A] Swelling   Onabotulinumtoxina Swelling   Other Hives    Ubrelvy   Toprol Xl [Metoprolol Succinate] Hives    Past Medical History:  Diagnosis Date   Allergy    Anemia    Anxiety    Arthritis    Asthma    Back pain    Blood transfusion without reported diagnosis    Cancer of sigmoid colon ([place])    Constipation    Depression    Diabetes mellitus without complication (HCC)    Dyspnea    with exertion   Fatigue    Fatty liver    mild   GERD (gastroesophageal reflux disease)    Grade I diastolic dysfunction    Echo 08/08/2017 with Grade I DD, EF 51%, Mild Aortic regurgitation, Mild Tricuspid Regurgitation, Pulmonary pressure 30 mm Hg. She denies any chest pain, dyspnea, or peripheral edema.   Headaches due to old head injury    History of stomach ulcers    Hyperlipidemia    Hypertension    Lactose intolerance    Left hip pain    Left thyroid nodule    Lipoma of extremity 10/17/2017   Left lower   Migraines    Obesity    Panniculitis    TBI (traumatic brain injury) ([place])    Thyroid disease    Vitamin D deficiency     Past Surgical History:  Procedure Laterality Date   ABDOMINAL HYSTERECTOMY     ABDOMINOPLASTY/PANNICULECTOMY WITH LIPOSUCTION N/A 09/02/2020   Procedure: PANNICULECTOMY WITH LIPOSUCTION;  Surgeon: Wallace Going, DO;  Location: [facility];  Service: Plastics;  Laterality: N/A;  3 hours, please   BREAST LUMPECTOMY WITH RADIOACTIVE SEED LOCALIZATION  Left 08/23/2016   Procedure: LEFT BREAST LUMPECTOMY WITH RADIOACTIVE SEED LOCALIZATION;  Surgeon: Rolm Bookbinder, MD;  Location: [facility];  Service: General;  Laterality: Left;   BREAST SURGERY  2003   reduction   CESAREAN SECTION     CHALAZION EXCISION Left 2010   COLONOSCOPY W/ POLYPECTOMY  02/16/2018   Dr. Delaney Meigs JOINT INJECTION  2011 and 2012   FLEXIBLE SIGMOIDOSCOPY N/A 04/18/2018  Procedure: FLEXIBLE SIGMOIDOSCOPY;  Surgeon: Ileana Roup, MD;  Location: [facility];  Service: General;  Laterality: N/A;   LAPAROSCOPIC SIGMOID COLECTOMY N/A 04/18/2018   Procedure: LAPAROSCOPIC ASSISTED SIGMOIDECTOMY;  Surgeon: Ileana Roup, MD;  Location: [facility];  Service: General;  Laterality: N/A;   [place]  03/2009   dipyridamole; normal pattern of perfusion in all regions, post-stress EF 65%, normal study    THYROIDECTOMY  1989   partial   TRANSTHORACIC ECHOCARDIOGRAM  03/2009   EF=>55%, borderline conc LVH; mild mitral annular calcif, trace MR; trace TR; AV mildly sclerotic, mild AV regurg; mild pulm valve regurg    TUBAL LIGATION      Family History  Problem Relation Age of Onset   Heart disease Mother        MI, HTN   Hypertension Mother    Thyroid disease Mother    Hyperlipidemia Mother    Sudden death Mother    Anxiety disorder Mother    Glaucoma Father    Diabetes Father    Thyroid disease Sister    Heart attack Paternal Uncle        HTN   Hypertension Paternal Uncle    Heart disease Maternal Grandmother        MI, CVA   Heart disease Maternal Grandfather        MI   Stroke Maternal Grandfather    Heart disease Paternal Grandmother    Stroke Paternal Grandmother    Heart disease Paternal Grandfather        MI   Depression Daughter    ADD / ADHD Son    Colon cancer Neg Hx    Stomach cancer Neg Hx    Esophageal cancer Neg Hx    Rectal cancer Neg Hx    Colon polyps Neg Hx     Social History:   reports that she has never smoked. She has never used smokeless tobacco. She reports current alcohol use. She reports that she does not use drugs.   No visits with results within 1 Week(s) from this visit.  Latest known visit with results is:  Office Visit on 05/23/2022  Component Date Value Ref Range Status   Testosterone 05/23/2022 223.92 (H)  15.00 - 40.00 ng/dL Final   DHEA-SO4 05/23/2022 21  9 - 118 mcg/dL Final   Comment: . DHEA-S values fall with advancing age. For reference, the reference intervals for 64-63 year old patients are: Marland Kitchen Female:   93-415 mcg/dL Female:  19-237 mcg/dL .    Prolactin 05/23/2022 5.2  ng/mL Final   Comment:             Reference Range  Females         Non-pregnant        3.0-30.0         Pregnant           10.0-209.0         Postmenopausal      2.0-20.0 . . .    WBC 05/23/2022 4.3  4.0 - 10.5 K/uL Final   RBC 05/23/2022 4.23  3.87 - 5.11 Mil/uL Final   Platelets 05/23/2022 237.0  150.0 - 400.0 K/uL Final   Hemoglobin 05/23/2022 12.6  12.0 - 15.0 g/dL Final   HCT 05/23/2022 37.7  36.0 - 46.0 % Final   MCV 05/23/2022 89.0  78.0 - 100.0 fl Final   MCHC 05/23/2022 33.5  30.0 - 36.0 g/dL Final  RDW 05/23/2022 13.7  11.5 - 15.5 % Final   Sodium 05/23/2022 142  135 - 145 mEq/L Final   Potassium 05/23/2022 3.6  3.5 - 5.1 mEq/L Final   Chloride 05/23/2022 104  96 - 112 mEq/L Final   CO2 05/23/2022 32  19 - 32 mEq/L Final   Glucose, Bld 05/23/2022 79  70 - 99 mg/dL Final   BUN 05/23/2022 9  6 - 23 mg/dL Final   Creatinine, Ser 05/23/2022 0.79  0.40 - 1.20 mg/dL Final   Total Bilirubin 05/23/2022 0.6  0.2 - 1.2 mg/dL Final   Alkaline Phosphatase 05/23/2022 75  39 - 117 U/L Final   AST 05/23/2022 20  0 - 37 U/L Final   ALT 05/23/2022 25  0 - 35 U/L Final   Total Protein 05/23/2022 6.5  6.0 - 8.3 g/dL Final   Albumin 05/23/2022 3.8  3.5 - 5.2 g/dL Final   GFR 05/23/2022 76.23  >60.00 mL/min Final   Calculated using the CKD-EPI Creatinine Equation  (2021)   Calcium 05/23/2022 9.1  8.4 - 10.5 mg/dL Final   Hgb A1c MFr Bld 05/23/2022 5.9  4.6 - 6.5 % Final   Glycemic Control Guidelines for People with Diabetes:Non Diabetic:  <6%Goal of Therapy: <7%Additional Action Suggested:  >8%    Cholesterol 05/23/2022 137  0 - 200 mg/dL Final   ATP III Classification       Desirable:  < 200 mg/dL               Borderline High:  200 - 239 mg/dL          High:  > = 240 mg/dL   Triglycerides 05/23/2022 77.0  0.0 - 149.0 mg/dL Final   Normal:  <150 mg/dLBorderline High:  150 - 199 mg/dL   HDL 05/23/2022 50.80  >39.00 mg/dL Final   VLDL 05/23/2022 15.4  0.0 - 40.0 mg/dL Final   LDL Cholesterol 05/23/2022 71  0 - 99 mg/dL Final   Total CHOL/HDL Ratio 05/23/2022 3   Final                  Men          Women1/2 Average Risk     3.4          3.3Average Risk          5.0          4.42X Average Risk          9.6          7.13X Average Risk          15.0          11.0                       NonHDL 05/23/2022 85.90   Final   NOTE:  Non-HDL goal should be 30 mg/dL higher than patient's LDL goal (i.e. LDL goal of < 70 mg/dL, would have non-HDL goal of < 100 mg/dL)   TSH 05/23/2022 0.74  0.35 - 5.50 uIU/mL Final   Review of Systems  Constitutional:  Negative for weight loss and reduced appetite.  HENT:  Negative for headaches.   Skin:  Negative for rash.  Psychiatric/Behavioral:  Positive for insomnia.Negative for depressed mood.     Physical Exam  BP (!) 146/82 (BP Location: Left Arm, Patient Position: Sitting, Cuff Size: Normal)   Pulse 77   Ht '5\' 3"'$  (1.6 m)   Wt 209  lb 12.8 oz (95.2 kg)   SpO2 97%   BMI 37.16 kg/m   No acanthosis of the neck present Moderate body hair present on the trunk especially abdominal area Also hair present on the upper back No alopecia seen  No rash or acne  Abdominal exam shows no masses or hepatomegaly  Assessment/Plan:   Clinical features of hyperandrogenism present for the last 3 months This includes abnormal  body hair and clitoral enlargement reported by patient Currently patient not followed by her gynecologist  She has a marked increase in total testosterone level of 224 drawn in the afternoon DHEA-S level is normal  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

## 2022-06-09 ENCOUNTER — Ambulatory Visit
Admission: RE | Admit: 2022-06-09 | Discharge: 2022-06-09 | Disposition: A | Discharge status: Home / Self Care | Payer: BC Managed Care – PPO | Source: Ambulatory Visit | Attending: Endocrinology | Admitting: Endocrinology

## 2022-06-09 DIAGNOSIS — R7989 Other specified abnormal findings of blood chemistry: Secondary | ICD-10-CM

## 2022-06-09 DIAGNOSIS — E288 Other ovarian dysfunction: Secondary | ICD-10-CM

## 2022-06-11 ENCOUNTER — Other Ambulatory Visit: Payer: Self-pay | Admitting: Endocrinology

## 2022-06-11 DIAGNOSIS — R7989 Other specified abnormal findings of blood chemistry: Secondary | ICD-10-CM

## 2022-06-11 DIAGNOSIS — L680 Hirsutism: Secondary | ICD-10-CM

## 2022-06-14 ENCOUNTER — Other Ambulatory Visit (INDEPENDENT_AMBULATORY_CARE_PROVIDER_SITE_OTHER): Payer: BC Managed Care – PPO

## 2022-06-14 DIAGNOSIS — R7989 Other specified abnormal findings of blood chemistry: Secondary | ICD-10-CM | POA: Diagnosis not present

## 2022-06-14 DIAGNOSIS — L680 Hirsutism: Secondary | ICD-10-CM

## 2022-06-14 LAB — CORTISOL: Cortisol, Plasma: 15.2 ug/dL

## 2022-06-17 ENCOUNTER — Telehealth: Payer: Self-pay

## 2022-06-17 ENCOUNTER — Other Ambulatory Visit: Payer: Self-pay | Admitting: Endocrinology

## 2022-06-17 DIAGNOSIS — R7989 Other specified abnormal findings of blood chemistry: Secondary | ICD-10-CM

## 2022-06-17 DIAGNOSIS — L680 Hirsutism: Secondary | ICD-10-CM

## 2022-06-17 DIAGNOSIS — E288 Other ovarian dysfunction: Secondary | ICD-10-CM

## 2022-06-17 MED ORDER — DEXAMETHASONE 1 MG PO TABS: ORAL_TABLET | ORAL | 0 refills | Status: DC

## 2022-06-17 NOTE — Telephone Encounter (Signed)
Patient called and LVM in regards to lab results, she says that she is concerned about her testosterone levels and would like to know what she needs to do moving forward.

## 2022-06-19 LAB — TESTOSTERONE, FREE, TOTAL, SHBG
Sex Hormone Binding: 56.8 nmol/L (ref 17.3–125.0)
Testosterone, Free: 1.7 pg/mL (ref 0.0–4.2)
Testosterone: 174 ng/dL — ABNORMAL HIGH (ref 3–67)

## 2022-06-22 ENCOUNTER — Other Ambulatory Visit (INDEPENDENT_AMBULATORY_CARE_PROVIDER_SITE_OTHER): Payer: BC Managed Care – PPO

## 2022-06-22 DIAGNOSIS — L680 Hirsutism: Secondary | ICD-10-CM | POA: Diagnosis not present

## 2022-06-22 DIAGNOSIS — E288 Other ovarian dysfunction: Secondary | ICD-10-CM | POA: Diagnosis not present

## 2022-06-22 LAB — CORTISOL: Cortisol, Plasma: 1.2 ug/dL

## 2022-06-23 ENCOUNTER — Encounter: Payer: Self-pay | Admitting: Endocrinology

## 2022-07-06 ENCOUNTER — Telehealth: Payer: Self-pay

## 2022-07-06 ENCOUNTER — Other Ambulatory Visit: Payer: Self-pay | Admitting: Endocrinology

## 2022-07-06 MED ORDER — LORAZEPAM 0.5 MG PO TABS: ORAL_TABLET | ORAL | 0 refills | Status: DC

## 2022-07-06 NOTE — Telephone Encounter (Signed)
Patient called states she has her scanning this week with Radiology and was informed needs something prescribed to relax her.

## 2022-07-09 ENCOUNTER — Ambulatory Visit
Admission: RE | Admit: 2022-07-09 | Discharge: 2022-07-09 | Disposition: A | Discharge status: Home / Self Care | Payer: BC Managed Care – PPO | Source: Ambulatory Visit | Attending: Endocrinology | Admitting: Endocrinology

## 2022-07-09 DIAGNOSIS — R7989 Other specified abnormal findings of blood chemistry: Secondary | ICD-10-CM

## 2022-07-11 ENCOUNTER — Encounter: Payer: Self-pay | Admitting: Endocrinology

## 2022-07-11 NOTE — Progress Notes (Signed)
MRI is negative, need to try a medication called spironolactone 25 mg daily to reduce body hair.  Need to follow-up in about 3 weeks with fasting labs.  This should not affect her blood pressure or potassium in this dose

## 2022-07-12 ENCOUNTER — Other Ambulatory Visit: Payer: Self-pay

## 2022-07-12 DIAGNOSIS — L680 Hirsutism: Secondary | ICD-10-CM

## 2022-07-12 MED ORDER — SPIRONOLACTONE 25 MG PO TABS: 25.0000 mg | ORAL_TABLET | Freq: Every day | ORAL | 3 refills | Status: DC

## 2022-07-14 ENCOUNTER — Other Ambulatory Visit: Payer: Self-pay | Admitting: Endocrinology

## 2022-07-14 DIAGNOSIS — L680 Hirsutism: Secondary | ICD-10-CM

## 2022-07-14 DIAGNOSIS — E288 Other ovarian dysfunction: Secondary | ICD-10-CM

## 2022-07-14 DIAGNOSIS — R7989 Other specified abnormal findings of blood chemistry: Secondary | ICD-10-CM

## 2022-07-14 DIAGNOSIS — E259 Adrenogenital disorder, unspecified: Secondary | ICD-10-CM

## 2022-08-03 ENCOUNTER — Other Ambulatory Visit (INDEPENDENT_AMBULATORY_CARE_PROVIDER_SITE_OTHER): Payer: BC Managed Care – PPO

## 2022-08-03 ENCOUNTER — Other Ambulatory Visit: Payer: BC Managed Care – PPO

## 2022-08-03 DIAGNOSIS — E288 Other ovarian dysfunction: Secondary | ICD-10-CM

## 2022-08-03 DIAGNOSIS — R7989 Other specified abnormal findings of blood chemistry: Secondary | ICD-10-CM

## 2022-08-03 DIAGNOSIS — L680 Hirsutism: Secondary | ICD-10-CM

## 2022-08-06 LAB — 17-HYDROXYPROGESTERONE: 17-OH Progesterone LCMS: 94 ng/dL

## 2022-08-06 LAB — ESTROGENS, TOTAL: Estrogen: 161 pg/mL (ref 40–244)

## 2022-08-06 LAB — ANDROSTENEDIONE: Androstenedione LCMS: 129 ng/dL — ABNORMAL HIGH (ref 17–99)

## 2022-08-11 ENCOUNTER — Ambulatory Visit (INDEPENDENT_AMBULATORY_CARE_PROVIDER_SITE_OTHER): Payer: BC Managed Care – PPO | Admitting: Obstetrics and Gynecology

## 2022-08-11 ENCOUNTER — Encounter: Payer: Self-pay | Admitting: Obstetrics and Gynecology

## 2022-08-11 VITALS — BP 124/76 | HR 66 | Ht 63.0 in | Wt 207.0 lb

## 2022-08-11 DIAGNOSIS — L680 Hirsutism: Secondary | ICD-10-CM | POA: Diagnosis not present

## 2022-08-11 DIAGNOSIS — R7989 Other specified abnormal findings of blood chemistry: Secondary | ICD-10-CM

## 2022-08-11 DIAGNOSIS — E259 Adrenogenital disorder, unspecified: Secondary | ICD-10-CM

## 2022-08-11 NOTE — Progress Notes (Unsigned)
GYNECOLOGY  VISIT   HPI: 71 y.o.   Married  [place] American  female   [phone] with No LMP recorded. Patient has had a hysterectomy.   Total abdominal hysterectomy/RSO for bleeding due to fibroids in 1989. here for elevated testosterone level, enlarged clitoris, hair loss and itching all over for 6 - 12 months.  Sent by Dr. Dwyane Dee.  Total testosterone 174 and free testosterone 1.7 on 06/14/22. Prior total testosterone 223.92 on 05/23/22. Patient is now taking Spironolactone.  Never took HRT.  Her husband is not using testosterone treatment.   She had a CT of the abdomen and pelvis in 2019.  Her adrenal glands were normal at that time.  She had a lytic lesion of her right superior pubic ramus and acetabulum.   Narrative & Impression  CLINICAL DATA:  Elevated testosterone levels. Evaluate for ovarian mass. Prior hysterectomy.   EXAM: MRI PELVIS WITHOUT CONTRAST   TECHNIQUE: Multiplanar multisequence MR imaging of the pelvis was performed. No intravenous contrast was administered.   COMPARISON:  CT on 02/23/2018   FINDINGS: Lower Urinary Tract: No urinary bladder or urethral abnormality identified.   Bowel: Unremarkable pelvic bowel loops.   Vascular/Lymphatic: Unremarkable. No pathologically enlarged pelvic lymph nodes identified.   Reproductive:   -- Uterus: Prior hysterectomy noted. Unremarkable appearance of vaginal cuff.   -- Right ovary: Not visualized, however no adnexal mass identified.   -- Left ovary: Appears normal. No ovarian or adnexal masses identified.   Other: No peritoneal thickening or abnormal free fluid.   Musculoskeletal:  Unremarkable.   : Prior hysterectomy.   Normal appearance of left ovary. Nonvisualization right ovary. No pelvic mass or other significant abnormality identified.     Electronically Signed   By: Marlaine Hind M.D.   On: 07/10/2022 18:32      GYNECOLOGIC HISTORY: No LMP recorded. Patient has had a  hysterectomy. Contraception:  Hyst Menopausal hormone therapy:  none Last mammogram:   12-01-21 Diag.Bil/ w/Lt.Br.US/Neg/Birads2/screening 1 yr Last pap smear:    05-15-13 Neg:Neg HR HPV        OB History     Gravida  2   Para  2   Term  2   Preterm      AB      Living  2      SAB      IAB      Ectopic      Multiple      Live Births                 Patient Active Problem List   Diagnosis Date Noted   S/P panniculectomy 09/21/2020   Neck pain 12/20/2019   Colon cancer ([place]) 04/18/2018   Class 2 obesity with serious comorbidity and body mass index (BMI) of 38.0 to 38.9 in adult 07/27/2017   Vitamin D deficiency 07/27/2017   Other fatigue 06/29/2017   Shortness of breath on exertion 06/29/2017   Class 2 obesity with serious comorbidity and body mass index (BMI) of 39.0 to 39.9 in adult 06/29/2017   Left hip pain 04/18/2017   Pre-diabetes 02/05/2016   Left thyroid nodule 11/10/2014   TBI (traumatic brain injury) ([place]) 07/15/2014   Chest pain 12/13/2013   Dyslipidemia 12/13/2013   Obesity (BMI 35.0-39.9 without comorbidity) 12/13/2013   Hypertension 01/18/2012   GERD (gastroesophageal reflux disease) 01/18/2012   Depression 01/18/2012   Migraine syndrome 01/18/2012    Past Medical History:  Diagnosis Date   Allergy  Anemia    Anxiety    Arthritis    Asthma    Back pain    Blood transfusion without reported diagnosis    Cancer of sigmoid colon ([place])    Constipation    Depression    Diabetes mellitus without complication (HCC)    Dyspnea    with exertion   Fatigue    Fatty liver    mild   GERD (gastroesophageal reflux disease)    Grade I diastolic dysfunction    Echo 08/08/2017 with Grade I DD, EF 51%, Mild Aortic regurgitation, Mild Tricuspid Regurgitation, Pulmonary pressure 30 mm Hg. She denies any chest pain, dyspnea, or peripheral edema.   Headaches due to old head injury    History of stomach ulcers    Hyperlipidemia    Hypertension     Lactose intolerance    Left hip pain    Left thyroid nodule    Lipoma of extremity 10/17/2017   Left lower   Migraines    Obesity    Panniculitis    TBI (traumatic brain injury) ([place])    Thyroid disease    Vitamin D deficiency     Past Surgical History:  Procedure Laterality Date   ABDOMINAL HYSTERECTOMY     ABDOMINOPLASTY/PANNICULECTOMY WITH LIPOSUCTION N/A 09/02/2020   Procedure: PANNICULECTOMY WITH LIPOSUCTION;  Surgeon: Wallace Going, DO;  Location: [facility];  Service: Plastics;  Laterality: N/A;  3 hours, please   BREAST LUMPECTOMY WITH RADIOACTIVE SEED LOCALIZATION Left 08/23/2016   Procedure: LEFT BREAST LUMPECTOMY WITH RADIOACTIVE SEED LOCALIZATION;  Surgeon: Rolm Bookbinder, MD;  Location: [facility];  Service: General;  Laterality: Left;   BREAST SURGERY  2003   reduction   CESAREAN SECTION     CHALAZION EXCISION Left 2010   COLONOSCOPY W/ POLYPECTOMY  02/16/2018   Dr. Delaney Meigs JOINT INJECTION  2011 and 2012   FLEXIBLE SIGMOIDOSCOPY N/A 04/18/2018   Procedure: FLEXIBLE SIGMOIDOSCOPY;  Surgeon: Ileana Roup, MD;  Location: [facility];  Service: General;  Laterality: N/A;   LAPAROSCOPIC SIGMOID COLECTOMY N/A 04/18/2018   Procedure: LAPAROSCOPIC ASSISTED SIGMOIDECTOMY;  Surgeon: Ileana Roup, MD;  Location: [facility];  Service: General;  Laterality: N/A;   MYOMECTOMY     NM [place]  03/2009   dipyridamole; normal pattern of perfusion in all regions, post-stress EF 65%, normal study    THYROIDECTOMY  1989   partial   TRANSTHORACIC ECHOCARDIOGRAM  03/2009   EF=>55%, borderline conc LVH; mild mitral annular calcif, trace MR; trace TR; AV mildly sclerotic, mild AV regurg; mild pulm valve regurg    TUBAL LIGATION      Current Outpatient Medications  Medication Sig Dispense Refill   albuterol (VENTOLIN HFA) 108 (90 Base) MCG/ACT inhaler INHALE 2 PUFFS INTO THE LUNGS EVERY 6 HOURS AS NEEDED FOR WHEEZING OR  SHORTNESS OF BREATH 54 g 1   amLODipine (NORVASC) 10 MG tablet Take 1 tablet (10 mg total) by mouth daily. 90 tablet 2   butalbital-acetaminophen-caffeine (FIORICET) 50-325-40 MG tablet Take by mouth.     carvedilol (COREG CR) 20 MG 24 hr capsule Take 1 capsule (20 mg total) by mouth daily. 90 capsule 3   cetirizine (ZYRTEC) 10 MG tablet Take 10 mg by mouth daily as needed for allergies.     Cholecalciferol (VITAMIN D3) 1.25 MG (50000 UT) CAPS Take 1 capsule by mouth once a week.     desvenlafaxine (PRISTIQ) 100 MG 24 hr  tablet Take 100 mg by mouth daily.     Diclofenac Potassium,Migraine, 50 MG PACK Take by mouth.     Fluocinolone Acetonide Body (DERMA-SMOOTHE/FS BODY) 0.01 % OIL Apply to scalp and leave on 8 hours or overnight as needed for eczema 118 mL 2   fluticasone (FLONASE) 50 MCG/ACT nasal spray Place 2 sprays into both nostrils daily. 16 g 0   frovatriptan (FROVA) 2.5 MG tablet Take 2.5 mg by mouth as needed for migraine. If recurs, may repeat after 2 hours. Max of 3 tabs in 24 hours.     gabapentin (NEURONTIN) 300 MG capsule Take 300 mg by mouth 3 (three) times daily as needed (headaches).      ibuprofen (ADVIL,MOTRIN) 200 MG tablet Take 200 mg by mouth 3 (three) times daily as needed for headache or moderate pain.     ipratropium (ATROVENT) 0.03 % nasal spray Place 2 sprays into the nose 4 (four) times daily. (Patient taking differently: Place 2 sprays into the nose 4 (four) times daily as needed for rhinitis.) 30 mL 1   LORazepam (ATIVAN) 0.5 MG tablet Use up to 2 tablets before MRI scan for claustrophobia 2 tablet 0   Misc. Devices MISC Patient to be out of work until next appointment follow in 8 weeks.     naproxen sodium (ALEVE) 220 MG tablet Take 220 mg by mouth daily as needed (pain).     OLANZapine (ZYPREXA) 5 MG tablet Take 5 mg by mouth at bedtime.     Olopatadine HCl (PATADAY) 0.2 % SOLN Apply 1 drop daily as needed to eye (for itching). 2.5 mL 9   omeprazole (PRILOSEC) 40 MG  capsule Take 40 mg by mouth daily.     polyethylene glycol (MIRALAX / GLYCOLAX) packet Take 17 g by mouth daily.     spironolactone (ALDACTONE) 25 MG tablet Take 1 tablet (25 mg total) by mouth daily. 90 tablet 3   valACYclovir (VALTREX) 1000 MG tablet Take 1 tablet (1,000 mg total) by mouth daily. For suppression 90 tablet 2   valsartan (DIOVAN) 320 MG tablet TAKE 1 TABLET(320 MG) BY MOUTH DAILY 90 tablet 3   atorvastatin (LIPITOR) 80 MG tablet Take 1 tablet (80 mg total) by mouth daily. 90 tablet 3   No current facility-administered medications for this visit.     ALLERGIES: Hydrochlorothiazide, Botox [botulinum toxin type a], Onabotulinumtoxina, Other, and Toprol xl [metoprolol succinate]  Family History  Problem Relation Age of Onset   Heart disease Mother        MI, HTN   Hypertension Mother    Thyroid disease Mother    Hyperlipidemia Mother    Sudden death Mother    Anxiety disorder Mother    Glaucoma Father    Diabetes Father    Thyroid disease Sister    Heart attack Paternal Uncle        HTN   Hypertension Paternal Uncle    Heart disease Maternal Grandmother        MI, CVA   Heart disease Maternal Grandfather        MI   Stroke Maternal Grandfather    Heart disease Paternal Grandmother    Stroke Paternal Grandmother    Heart disease Paternal Grandfather        MI   Depression Daughter    ADD / ADHD Son    Colon cancer Neg Hx    Stomach cancer Neg Hx    Esophageal cancer Neg Hx    Rectal cancer  Neg Hx    Colon polyps Neg Hx     Social History   Socioeconomic History   Marital status: Married    Spouse name: Carloyn Manner   Number of children: 2   Years of education: Not on file   Highest education level: Not on file  Occupational History   Occupation: retired    Fish farm manager: [employer]  Tobacco Use   Smoking status: Never   Smokeless tobacco: Never  Vaping Use   Vaping Use: Never used  Substance and Sexual Activity   Alcohol use: Not Currently    Drug use: No   Sexual activity: Yes    Birth control/protection: Surgical    Comment: Hyst/LSO  Other Topics Concern   Not on file  Social History Narrative   Not on file   Social Determinants of Health   Financial Resource Strain: Not on file  Food Insecurity: Not on file  Transportation Needs: Not on file  Physical Activity: Not on file  Stress: Not on file  Social Connections: Not on file  Intimate Partner Violence: Not on file    Review of Systems  PHYSICAL EXAMINATION:    BP 124/76   Pulse 66   Ht '5\' 3"'$  (1.6 m)   Wt 207 lb (93.9 kg)   SpO2 96%   BMI 36.67 kg/m     General appearance: alert, cooperative and appears stated age Head: Normocephalic, without obvious abnormality, atraumatic Neck: no adenopathy, supple, symmetrical, trachea midline and thyroid normal to inspection and palpation Lungs: clear to auscultation bilaterally Breasts: normal appearance, no masses or tenderness, No nipple retraction or dimpling, No nipple discharge or bleeding, No axillary or supraclavicular adenopathy Heart: regular rate and rhythm Abdomen: soft, non-tender, no masses,  no organomegaly Extremities: extremities normal, atraumatic, no cyanosis or edema Skin: Skin color, texture, turgor normal. No rashes or lesions Lymph nodes: Cervical, supraclavicular, and axillary nodes normal. No abnormal inguinal nodes palpated Neurologic: Grossly normal  Pelvic: External genitalia:  no lesions              Urethra:  normal appearing urethra with no masses, tenderness or lesions              Bartholins and Skenes: normal                 Vagina: normal appearing vagina with normal color and discharge, no lesions              Cervix: no lesions                Bimanual Exam:  Uterus:  normal size, contour, position, consistency, mobility, non-tender              Adnexa: no mass, fullness, tenderness              Rectal exam: {yes no:314532}.  Confirms.              Anus:  normal sphincter  tone, no lesions  Chaperone was present for exam:  ***  ASSESSMENT  Status post TAH/RSO.  Hx anxiety.  Hx sigmoid colectomy for colon cancer 2018. Pre-DM.  A1C 5.9. Diet controlled.   PLAN  Plan for open MRI to check adrenal glands.  Will treat for anxiety with Ativan pre-procedure.  She will see Dr. Dwyane Dee 08/31/22 for follow up.   An After Visit Summary was printed and given to the patient.  ***  total time was spent for this patient encounter, including preparation, face-to-face  counseling with the patient, coordination of care, and documentation of the encounter.

## 2022-08-12 ENCOUNTER — Telehealth: Payer: Self-pay | Admitting: Obstetrics and Gynecology

## 2022-08-12 DIAGNOSIS — R7989 Other specified abnormal findings of blood chemistry: Secondary | ICD-10-CM

## 2022-08-12 DIAGNOSIS — E259 Adrenogenital disorder, unspecified: Secondary | ICD-10-CM

## 2022-08-12 DIAGNOSIS — F40240 Claustrophobia: Secondary | ICD-10-CM

## 2022-08-12 NOTE — Telephone Encounter (Signed)
Please precert and schedule an open MRI of the abdomen to rule out adrenal gland pathology.   Patient has elevated testosterone and virilization.   She will need Ativan 1 mg orally prior to the procedure as she has anxiety.

## 2022-08-12 NOTE — Telephone Encounter (Signed)
She will need a driver the day of the appointment.

## 2022-08-16 MED ORDER — LORAZEPAM 1 MG PO TABS: ORAL_TABLET | ORAL | 0 refills | Status: DC

## 2022-08-29 ENCOUNTER — Telehealth: Payer: Self-pay | Admitting: Endocrinology

## 2022-08-29 NOTE — Telephone Encounter (Signed)
Patient called not understanding why her MRI per Dr. Dwyane Dee was cancelled tomorrow and if she was still to come to her appointment with Dr. Dwyane Dee on Thursday.

## 2022-08-30 ENCOUNTER — Other Ambulatory Visit: Payer: BC Managed Care – PPO

## 2022-08-30 NOTE — Telephone Encounter (Signed)
Pt left voicemail states MRI for today has been cancelled as it is still need auth from insurance. Per patient if you are able to do a peer to peer that could speed the process up. Please advise.

## 2022-08-31 ENCOUNTER — Encounter: Payer: Self-pay | Admitting: Obstetrics and Gynecology

## 2022-08-31 ENCOUNTER — Other Ambulatory Visit: Payer: Self-pay

## 2022-08-31 DIAGNOSIS — R7989 Other specified abnormal findings of blood chemistry: Secondary | ICD-10-CM

## 2022-08-31 DIAGNOSIS — E259 Adrenogenital disorder, unspecified: Secondary | ICD-10-CM

## 2022-08-31 NOTE — Telephone Encounter (Signed)
Instead of an MRI, please order CT of abdomen (I believe with and without contrast, but please check with [facility])  Diagnoses:  elevated testosterone and virilization.   The CT is to rule out an adrenal tumor.

## 2022-08-31 NOTE — Telephone Encounter (Signed)
Spoke with patient.  Advised that MRI was initially denied.  Physician review required and that information has been provided to Dr. Quincy Simmonds this afternoon. Advised once completed can take 24-48 hours for response. Advised I will forward MyChart message to Dr. Quincy Simmonds to review and our office will f/u once response received. Patient thankful for call.   Routing to Dr. Quincy Simmonds  Cc: Sheela Stack

## 2022-08-31 NOTE — Telephone Encounter (Signed)
Spoke with patient and she will reach out to her gynecologist.

## 2022-09-01 ENCOUNTER — Ambulatory Visit: Payer: BC Managed Care – PPO | Admitting: Endocrinology

## 2022-09-01 NOTE — Telephone Encounter (Signed)
Spoke with Malachy Mood at [facility].  Was advised to order CT abdomen w/wo contrast. Adrenal protocol.   Order placed. Spoke with Felicia in scheduling at [facility], states they will contact patient directly today to schedule.  MRI order cancelled.   Spoke with patient. Advised per Dr. Quincy Simmonds.  Advised [employer] will contact to schedule. Advised our business office will start authorization process and f/u once completed. Patient verbalizes understanding.   Routing to [place] to review benefits.  Please provide update to patient once benefits reviewed.   Cc: Dr. Antony Blackbird

## 2022-09-06 NOTE — Telephone Encounter (Signed)
Physician review completed today by Dr. Quincy Simmonds and approved for CT abd/pelvis.   Call placed to patient to provide update, spoke with patient, she is traveling, request call after 4pm today.

## 2022-09-06 NOTE — Telephone Encounter (Signed)
Spoke with patient. Patient aware of approval for CT. Patient to contact insurance company or [employer] for additional questions regarding OOP pocket cost. Patient verbalizes understanding and appreciative of call.   Routing to provider for final review. Patient is agreeable to disposition. Will close encounter.

## 2022-09-27 ENCOUNTER — Ambulatory Visit
Admission: RE | Admit: 2022-09-27 | Discharge: 2022-09-27 | Disposition: A | Discharge status: Home / Self Care | Payer: BC Managed Care – PPO | Source: Ambulatory Visit | Attending: Obstetrics and Gynecology | Admitting: Obstetrics and Gynecology

## 2022-09-27 DIAGNOSIS — E259 Adrenogenital disorder, unspecified: Secondary | ICD-10-CM

## 2022-09-27 DIAGNOSIS — R7989 Other specified abnormal findings of blood chemistry: Secondary | ICD-10-CM

## 2022-10-04 ENCOUNTER — Telehealth: Payer: Self-pay | Admitting: Obstetrics and Gynecology

## 2022-10-04 DIAGNOSIS — E259 Adrenogenital disorder, unspecified: Secondary | ICD-10-CM

## 2022-10-04 DIAGNOSIS — R7989 Other specified abnormal findings of blood chemistry: Secondary | ICD-10-CM

## 2022-10-04 NOTE — Telephone Encounter (Signed)
Diagnoses will be:  elevated testosterone, elevated estradiol, virilization.

## 2022-10-04 NOTE — Telephone Encounter (Signed)
Phone call to discuss CT scan results.  Adrenal glands normal.  Left ovary normal.  Post surgical changes from colon resection and reanastomosis.   I shared with the patient that I reached out to Dr. Berline Lopes, GYN ONC, to discuss her care, and Dr. Berline Lopes agreed to accept her for evaluation and surgical treatment for left ovarian removal.  I think this is a reasonable idea given the elevated hormones, virilization in a postmenopausal patient, and history of prior colon cancer surgery with adhesions likely.   Patient is in agreement with the plan.   Will have my office make this referral and also cancel the patient's appointment with me for 10/10/22.

## 2022-10-04 NOTE — Telephone Encounter (Signed)
10/10/22 visit canceled.  Dr.Silva what is the diagnosis for referral? I have referral pending in this encounter to associate and sign.

## 2022-10-05 NOTE — Telephone Encounter (Signed)
Referral signed they will call patient to schedule.

## 2022-10-06 ENCOUNTER — Telehealth: Payer: Self-pay | Admitting: *Deleted

## 2022-10-06 ENCOUNTER — Encounter: Payer: Self-pay | Admitting: Gynecologic Oncology

## 2022-10-06 NOTE — Telephone Encounter (Signed)
Patient scheduled on 10/11/22

## 2022-10-06 NOTE — Telephone Encounter (Signed)
Spoke with the patient regarding the referral to GYN oncology. Patient scheduled as new patient with Dr Berline Lopes on 11/14 at 10:30 am. Patient given an arrival time of 10 am.  Explained to the patient the the doctor will perform a pelvic exam at this visit. Patient given the policy that no visitors under the 16 yrs are allowed in the [facility]. Patient given the address/phone number for the clinic and that the center offers free valet service.

## 2022-10-06 NOTE — Telephone Encounter (Signed)
Encounter reviewed and closed.  

## 2022-10-10 ENCOUNTER — Ambulatory Visit: Payer: BC Managed Care – PPO | Admitting: Obstetrics and Gynecology

## 2022-10-11 ENCOUNTER — Inpatient Hospital Stay (HOSPITAL_BASED_OUTPATIENT_CLINIC_OR_DEPARTMENT_OTHER): Payer: BC Managed Care – PPO | Admitting: Gynecologic Oncology

## 2022-10-11 ENCOUNTER — Encounter: Payer: Self-pay | Admitting: Gynecologic Oncology

## 2022-10-11 ENCOUNTER — Inpatient Hospital Stay: Payer: BC Managed Care – PPO | Attending: Gynecologic Oncology | Admitting: Gynecologic Oncology

## 2022-10-11 VITALS — BP 164/81 | HR 88 | Temp 98.9°F | Resp 16 | Ht 63.78 in | Wt 206.2 lb

## 2022-10-11 DIAGNOSIS — E119 Type 2 diabetes mellitus without complications: Secondary | ICD-10-CM | POA: Diagnosis not present

## 2022-10-11 DIAGNOSIS — F32A Depression, unspecified: Secondary | ICD-10-CM | POA: Insufficient documentation

## 2022-10-11 DIAGNOSIS — K5900 Constipation, unspecified: Secondary | ICD-10-CM | POA: Insufficient documentation

## 2022-10-11 DIAGNOSIS — L680 Hirsutism: Secondary | ICD-10-CM | POA: Insufficient documentation

## 2022-10-11 DIAGNOSIS — I082 Rheumatic disorders of both aortic and tricuspid valves: Secondary | ICD-10-CM | POA: Insufficient documentation

## 2022-10-11 DIAGNOSIS — E739 Lactose intolerance, unspecified: Secondary | ICD-10-CM | POA: Insufficient documentation

## 2022-10-11 DIAGNOSIS — Z6835 Body mass index (BMI) 35.0-35.9, adult: Secondary | ICD-10-CM | POA: Insufficient documentation

## 2022-10-11 DIAGNOSIS — J45909 Unspecified asthma, uncomplicated: Secondary | ICD-10-CM | POA: Diagnosis not present

## 2022-10-11 DIAGNOSIS — Z87820 Personal history of traumatic brain injury: Secondary | ICD-10-CM | POA: Insufficient documentation

## 2022-10-11 DIAGNOSIS — K219 Gastro-esophageal reflux disease without esophagitis: Secondary | ICD-10-CM | POA: Diagnosis not present

## 2022-10-11 DIAGNOSIS — F419 Anxiety disorder, unspecified: Secondary | ICD-10-CM | POA: Insufficient documentation

## 2022-10-11 DIAGNOSIS — E559 Vitamin D deficiency, unspecified: Secondary | ICD-10-CM | POA: Diagnosis not present

## 2022-10-11 DIAGNOSIS — N9089 Other specified noninflammatory disorders of vulva and perineum: Secondary | ICD-10-CM

## 2022-10-11 DIAGNOSIS — K760 Fatty (change of) liver, not elsewhere classified: Secondary | ICD-10-CM | POA: Diagnosis not present

## 2022-10-11 DIAGNOSIS — Z79624 Long term (current) use of inhibitors of nucleotide synthesis: Secondary | ICD-10-CM | POA: Insufficient documentation

## 2022-10-11 DIAGNOSIS — Z90721 Acquired absence of ovaries, unilateral: Secondary | ICD-10-CM | POA: Insufficient documentation

## 2022-10-11 DIAGNOSIS — E669 Obesity, unspecified: Secondary | ICD-10-CM | POA: Insufficient documentation

## 2022-10-11 DIAGNOSIS — E281 Androgen excess: Secondary | ICD-10-CM | POA: Insufficient documentation

## 2022-10-11 DIAGNOSIS — E785 Hyperlipidemia, unspecified: Secondary | ICD-10-CM | POA: Insufficient documentation

## 2022-10-11 DIAGNOSIS — Z85038 Personal history of other malignant neoplasm of large intestine: Secondary | ICD-10-CM | POA: Insufficient documentation

## 2022-10-11 DIAGNOSIS — Z79899 Other long term (current) drug therapy: Secondary | ICD-10-CM | POA: Diagnosis not present

## 2022-10-11 DIAGNOSIS — M1990 Unspecified osteoarthritis, unspecified site: Secondary | ICD-10-CM | POA: Diagnosis not present

## 2022-10-11 DIAGNOSIS — I10 Essential (primary) hypertension: Secondary | ICD-10-CM | POA: Insufficient documentation

## 2022-10-11 DIAGNOSIS — Z9889 Other specified postprocedural states: Secondary | ICD-10-CM

## 2022-10-11 DIAGNOSIS — Z90710 Acquired absence of both cervix and uterus: Secondary | ICD-10-CM | POA: Diagnosis not present

## 2022-10-11 DIAGNOSIS — E041 Nontoxic single thyroid nodule: Secondary | ICD-10-CM | POA: Diagnosis not present

## 2022-10-11 MED ORDER — TRAMADOL HCL 50 MG PO TABS: 50.0000 mg | ORAL_TABLET | Freq: Four times a day (QID) | ORAL | 0 refills | Status: DC | PRN

## 2022-10-11 NOTE — Progress Notes (Signed)
GYNECOLOGIC ONCOLOGY NEW PATIENT CONSULTATION   Patient Name: [name]  Patient Age: 71 y.o. 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  I reviewed with the patient recent work-up including imaging and lab work.  Both pelvic MRI and CT of the abdomen show normal-appearing left ovary, no adrenal lesions.  Given lab test including a normal DHEA-S, this would favor ovarian production rather than adrenal production of testosterone.  Patient has been on spironolactone with minimal if any change in her symptoms.  For diagnostic and therapeutic purposes, I recommend that we proceed with surgery to remove her remaining ovary.  Although her ovary appears normal on MRI and CT scan, it is possible that she has either an ovarian tumor or ovarian hyperthecosis leading to her elevated androgen production.  While many ovarian tumors that can produce androgens are benign, plan would be to send the ovary for frozen section to assure no malignancy.  Given normal estrogen, I would be very surprised if the patient had a granulosa cell tumor, but this is possible.  We discussed her surgical history.  At the time of her 2019 colon surgery, no significant pelvic adhesive disease as described.  Given this colon surgery, I reviewed that there may be some scar tissue in the colon can sometimes be adherent to the pelvic sidewall where the left ovary is.  If a malignancy was found at the time of surgery, we discussed possible need for additional staging procedures including peritoneal biopsies, omentectomy, and lymph node sampling.  We reviewed the plan for a robotic assisted unilateral salpingo-oophorectomy, possible staging, possible laparotomy. The risks of surgery were discussed in detail  and she understands these to include infection; wound separation; hernia; vaginal cuff separation, injury to adjacent organs such as bowel, bladder, blood vessels, ureters and nerves; bleeding which may require blood transfusion; anesthesia risk; thromboembolic events; possible death; unforeseen complications; possible need for re-exploration; medical complications such as heart attack, stroke, pleural effusion and pneumonia. The patient will receive DVT and antibiotic prophylaxis as indicated. She voiced a clear understanding. She had the opportunity to ask questions. Perioperative instructions were reviewed with her. Prescriptions for post-op medications were sent to her pharmacy of choice.  A copy of this note was sent to the patient's referring provider.   60 minutes of total time was spent for this patient encounter, including preparation, face-to-face counseling with the patient and coordination of care, and documentation of the encounter.  Jeral Pinch, MD  Division of Gynecologic Oncology  Department of Obstetrics and Gynecology  [facility]  ___________________________________________  Chief Complaint: No chief complaint on file.   History of Present Illness:  [name] is a 71 y.o. y.o. female who is seen in consultation at the request of Dr. Quincy Simmonds for an evaluation of elevated androgens.  She initially presented with 6 months of increasing body hair, clitorimegaly.  Today, the patient tells me that before 6-12 months ago, she had almost no hair on her abdomen.  She has been using electrolysis for hair removal on her face, is unsure whether there is been any difference in facial hair.  She also notes noticing that her voice is deeper.  06/09/2022: Pelvic ultrasound exam shows prior hysterectomy, nonvisualization of either ovary.  No adnexal masses or free fluid. 07/09/2022: Pelvic MRI reveals  prior hysterectomy.  Right ovary not visualized but  no adnexal mass identified.  Left ovary appears normal, no ovarian or adnexal mass identified. 09/27/2022: CT of the abdomen and pelvis reveals left ovary without change in size from prior MRI.  Right ovary not visible.  No adrenal lesion.  Liver with question of cirrhotic morphology, no signs of portal hypertension.  Nephrolithiasis within the upper left renal pole.  Postsurgical changes of the rectosigmoid colon and anastomosis.  Labs: Testosterone: 223 on 6/26, 174 on 7/18 Testosterone, free: 1.7 SHBG: 56.8 Androstenedione: 129 Estrogen: 161 Testosterone: 224 DHEA-SO4: 21 17OHP: 94 Cortisol: 152 on 7/18, 1.2 on 7/26  Her surgical history is notable for 2 myomectomies performed through a Pfannenstiel incision followed by 2 C-sections.  She then underwent total hysterectomy with unilateral salpingo-oophorectomy (right side) in 1989.  This was through a Pfannenstiel incision and done in the setting of fibroids with heavy uterine bleeding.  She notes that it was done in a rather emergent fashion.  She developed hot flashes after but cannot remember when they started.  She was never placed on any hormone replacement therapy.  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  In 2021, she underwent panniculectomy.  She reports doing well today.  Denies any abdominal or pelvic pain.  Reports baseline bowel and bladder function.  Denies any recent weight changes.  Endorses a good appetite without nausea or emesis.  PAST MEDICAL HISTORY:  Past Medical History:  Diagnosis Date   Allergy    Anemia    Anxiety    Arthritis    Asthma    no treatment, symptoms with allergies   Back pain    Blood transfusion without reported diagnosis    Cancer of sigmoid colon ([place])    Constipation    resolved with Miralax   Depression    Diabetes mellitus without complication ([place])    Dyspnea    with exertion   Fatigue    Fatty liver    mild   GERD (gastroesophageal  reflux disease)    Grade I diastolic dysfunction    Echo 08/08/2017 with Grade I DD, EF 51%, Mild Aortic regurgitation, Mild Tricuspid Regurgitation, Pulmonary pressure 30 mm Hg. She denies any chest pain, dyspnea, or peripheral edema.   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, some issues with short-term memory   Thyroid disease    Vitamin D deficiency      PAST SURGICAL HISTORY:  Past Surgical History:  Procedure Laterality Date   ABDOMINAL HYSTERECTOMY     ABDOMINOPLASTY/PANNICULECTOMY WITH LIPOSUCTION N/A 09/02/2020   Procedure: PANNICULECTOMY WITH LIPOSUCTION;  Surgeon: Wallace Going, DO;  Location: [facility];  Service: Plastics;  Laterality: N/A;  3 hours, please   BREAST LUMPECTOMY WITH RADIOACTIVE SEED LOCALIZATION Left 08/23/2016   Procedure: LEFT BREAST LUMPECTOMY WITH RADIOACTIVE SEED LOCALIZATION;  Surgeon: Rolm Bookbinder, MD;  Location: [facility];  Service: General;  Laterality: Left;   BREAST SURGERY  2003   reduction   CESAREAN SECTION     x2   CHALAZION EXCISION  Left 2010   COLONOSCOPY W/ POLYPECTOMY  02/16/2018   Dr. Delaney Meigs JOINT INJECTION  2011 and 2012   FLEXIBLE SIGMOIDOSCOPY N/A 04/18/2018   Procedure: FLEXIBLE SIGMOIDOSCOPY;  Surgeon: Ileana Roup, MD;  Location: [facility];  Service: General;  Laterality: N/A;   LAPAROSCOPIC SIGMOID COLECTOMY N/A 04/18/2018   Procedure: LAPAROSCOPIC ASSISTED SIGMOIDECTOMY;  Surgeon: Ileana Roup, MD;  Location: [facility];  Service: General;  Laterality: N/A;   MYOMECTOMY     MYOMECTOMY     x2   NM [place]  03/2009   dipyridamole; normal pattern of perfusion in all regions, post-stress EF  65%, normal study    THYROIDECTOMY  1989   partial   TRANSTHORACIC ECHOCARDIOGRAM  03/2009   EF=>55%, borderline conc LVH; mild mitral annular calcif, trace MR; trace TR; AV mildly sclerotic, mild AV regurg; mild pulm valve regurg    TUBAL LIGATION      OB/GYN HISTORY:  OB History  Gravida Para Term Preterm AB Living  '3 2 2     2  '$ SAB IAB Ectopic Multiple Live Births               # Outcome Date GA Lbr Len/2nd Weight Sex Delivery Anes PTL Lv  3 Gravida           2 Term      CS-Unspec     1 Term      CS-Unspec       No LMP recorded. Patient has had a hysterectomy.  Age at menarche: 51 Age at menopause: Unsure, see HPI for details after her hysterectomy.  Developed hot flashes, but cannot remember when this happened Hx of HRT: Denies Hx of STDs: Yes Last pap: 2014 - NIML, HR HPV negative History of abnormal pap smears: Denies  SCREENING STUDIES:  Last mammogram: 2023  Last colonoscopy: 2023 Last bone mineral density: 2018  MEDICATIONS: Outpatient Encounter Medications as of 10/11/2022  Medication Sig   albuterol (VENTOLIN HFA) 108 (90 Base) MCG/ACT inhaler INHALE 2 PUFFS INTO THE LUNGS EVERY 6 HOURS AS NEEDED FOR WHEEZING OR SHORTNESS OF BREATH   amLODipine (NORVASC) 10 MG tablet Take 1 tablet (10 mg total) by mouth daily.   atorvastatin (LIPITOR) 80 MG tablet Take 1 tablet (80 mg total) by mouth daily.   carvedilol (COREG CR) 20 MG 24 hr capsule Take 1 capsule (20 mg total) by mouth daily.   cetirizine (ZYRTEC) 10 MG tablet Take 10 mg by mouth daily as needed for allergies.   Cholecalciferol (VITAMIN D3) 1.25 MG (50000 UT) CAPS Take 1 capsule by mouth once a week.   desvenlafaxine (PRISTIQ) 100 MG 24 hr tablet Take 100 mg by mouth daily.   Diclofenac Potassium,Migraine, 50 MG PACK Take by mouth.   Fluocinolone Acetonide Body (DERMA-SMOOTHE/FS BODY) 0.01 % OIL Apply to scalp and leave on 8 hours or overnight as needed for eczema   fluticasone (FLONASE) 50 MCG/ACT nasal  spray Place 2 sprays into both nostrils daily.   frovatriptan (FROVA) 2.5 MG tablet Take 2.5 mg by mouth as needed for migraine. If recurs, may repeat after 2 hours. Max of 3 tabs in 24 hours.   gabapentin (NEURONTIN) 300 MG capsule Take 300 mg by mouth 3 (three) times daily as needed (headaches).    ipratropium (ATROVENT) 0.03 % nasal spray Place 2 sprays into the nose 4 (four) times daily. (Patient taking differently: Place 2 sprays into the nose 4 (four) times daily  as needed for rhinitis.)   LORazepam (ATIVAN) 1 MG tablet Take 1 tab orally prior to MRI scan due to claustrophobia.   Misc. Devices MISC Patient to be out of work until next appointment follow in 8 weeks.   OLANZapine (ZYPREXA) 5 MG tablet Take 5 mg by mouth at bedtime.   Olopatadine HCl (PATADAY) 0.2 % SOLN Apply 1 drop daily as needed to eye (for itching).   omeprazole (PRILOSEC) 40 MG capsule Take 40 mg by mouth daily.   polyethylene glycol (MIRALAX / GLYCOLAX) packet Take 17 g by mouth daily.   spironolactone (ALDACTONE) 25 MG tablet Take 1 tablet (25 mg total) by mouth daily.   traMADol (ULTRAM) 50 MG tablet Take 1 tablet (50 mg total) by mouth every 6 (six) hours as needed for severe pain. For AFTER surgery only, do not take and drive   valACYclovir (VALTREX) 1000 MG tablet Take 1 tablet (1,000 mg total) by mouth daily. For suppression   valsartan (DIOVAN) 320 MG tablet TAKE 1 TABLET(320 MG) BY MOUTH DAILY   [DISCONTINUED] ibuprofen (ADVIL,MOTRIN) 200 MG tablet Take 200 mg by mouth 3 (three) times daily as needed for headache or moderate pain.   [DISCONTINUED] LORazepam (ATIVAN) 0.5 MG tablet Use up to 2 tablets before MRI scan for claustrophobia   [DISCONTINUED] naproxen sodium (ALEVE) 220 MG tablet Take 220 mg by mouth daily as needed (pain).   No facility-administered encounter medications on file as of 10/11/2022.    ALLERGIES:  Allergies  Allergen Reactions   Hydrochlorothiazide Other (See Comments)    Increases  risk of gout flares    Botox [Botulinum Toxin Type A] Swelling   Onabotulinumtoxina Swelling   Other Hives    Ubrelvy   Toprol Xl [Metoprolol Succinate] Hives     FAMILY HISTORY:  Family History  Problem Relation Age of Onset   Heart disease Mother        MI, HTN   Hypertension Mother    Thyroid disease Mother    Hyperlipidemia Mother    Sudden death Mother    Anxiety disorder Mother    Glaucoma Father    Diabetes Father    Thyroid disease Sister    Prostate cancer Brother    Heart disease Maternal Grandmother        MI, CVA   Heart disease Maternal Grandfather        MI   Stroke Maternal Grandfather    Heart disease Paternal Grandmother    Stroke Paternal Grandmother    Heart disease Paternal Grandfather        MI   Depression Daughter    ADD / ADHD Son    Heart attack Paternal Uncle        HTN   Hypertension Paternal Uncle    Breast cancer Cousin    Breast cancer Paternal Great-grandmother    Colon cancer Neg Hx    Stomach cancer Neg Hx    Esophageal cancer Neg Hx    Rectal cancer Neg Hx    Colon polyps Neg Hx    Ovarian cancer Neg Hx    Endometrial cancer Neg Hx    Pancreatic cancer Neg Hx      SOCIAL HISTORY:  Social Connections: Not on file    REVIEW OF SYSTEMS:  Denies appetite changes, fevers, chills, fatigue, unexplained weight changes. Denies hearing loss, neck lumps or masses, mouth sores, ringing in ears or voice changes. Denies cough or wheezing.  Denies shortness of breath. Denies chest pain  or palpitations. Denies leg swelling. Denies abdominal distention, pain, blood in stools, constipation, diarrhea, nausea, vomiting, or early satiety. Denies pain with intercourse, dysuria, frequency, hematuria or incontinence. Denies hot flashes, pelvic pain, vaginal bleeding or vaginal discharge.   Denies joint pain, back pain or muscle pain/cramps. Denies itching, rash, or wounds. Denies dizziness, headaches, numbness or seizures. Denies swollen lymph  nodes or glands, denies easy bruising or bleeding. Denies anxiety, depression, confusion, or decreased concentration.  Physical Exam:  Vital Signs for this encounter:  Blood pressure (!) 164/81, pulse 88, temperature 98.9 F (37.2 C), temperature source Oral, resp. rate 16, height 5' 3.78" (1.62 m), weight 206 lb 3.2 oz (93.5 kg), SpO2 95 %. Body mass index is 35.64 kg/m. General: Alert, oriented, no acute distress.  HEENT: Normocephalic, atraumatic. Sclera anicteric.  Chest: Clear to auscultation bilaterally. No wheezes, rhonchi, or rales. Cardiovascular: Regular rate and rhythm, no murmurs, rubs, or gallops.  Abdomen: Obese. Normoactive bowel sounds. Soft, nondistended, nontender to palpation. No masses or hepatosplenomegaly appreciated. No palpable fluid wave.  Large lower abdominal incision. Extremities: Grossly normal range of motion. Warm, well perfused. No edema bilaterally.  Skin: No rashes or lesions.  Lymphatics: No cervical, supraclavicular, or inguinal adenopathy.  GU:  Normal external female genitalia.  Enlarged clitoris measuring approximately 3 x 3 cm.  No lesions. No discharge or bleeding.             Bladder/urethra:  No lesions or masses, well supported bladder             Vagina: Well rugated, no lesions noted.  Physiologic discharge.             Cervix/uterus: Surgically absent.             Adnexa: No masses appreciated.  Rectal: Deferred.   LABORATORY AND RADIOLOGIC DATA:  Outside medical records were reviewed to synthesize the above history, along with the history and physical obtained during the visit.   Lab Results  Component Value Date   WBC 4.3 05/23/2022   HGB 12.6 05/23/2022   HCT 37.7 05/23/2022   PLT 237.0 05/23/2022   GLUCOSE 79 05/23/2022   CHOL 137 05/23/2022   TRIG 77.0 05/23/2022   HDL 50.80 05/23/2022   LDLCALC 71 05/23/2022   ALT 25 05/23/2022   AST 20 05/23/2022   NA 142 05/23/2022   K 3.6 05/23/2022   CL 104 05/23/2022   CREATININE  0.79 05/23/2022   BUN 9 05/23/2022   CO2 32 05/23/2022   TSH 0.74 05/23/2022   INR 1.1 01/09/2009   HGBA1C 5.9 05/23/2022

## 2022-10-11 NOTE — H&P (View-Only) (Signed)
GYNECOLOGIC ONCOLOGY NEW PATIENT CONSULTATION   Patient Name: [name]  Patient Age: 71 y.o. 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  I reviewed with the patient recent work-up including imaging and lab work.  Both pelvic MRI and CT of the abdomen show normal-appearing left ovary, no adrenal lesions.  Given lab test including a normal DHEA-S, this would favor ovarian production rather than adrenal production of testosterone.  Patient has been on spironolactone with minimal if any change in her symptoms.  For diagnostic and therapeutic purposes, I recommend that we proceed with surgery to remove her remaining ovary.  Although her ovary appears normal on MRI and CT scan, it is possible that she has either an ovarian tumor or ovarian hyperthecosis leading to her elevated androgen production.  While many ovarian tumors that can produce androgens are benign, plan would be to send the ovary for frozen section to assure no malignancy.  Given normal estrogen, I would be very surprised if the patient had a granulosa cell tumor, but this is possible.  We discussed her surgical history.  At the time of her 2019 colon surgery, no significant pelvic adhesive disease as described.  Given this colon surgery, I reviewed that there may be some scar tissue in the colon can sometimes be adherent to the pelvic sidewall where the left ovary is.  If a malignancy was found at the time of surgery, we discussed possible need for additional staging procedures including peritoneal biopsies, omentectomy, and lymph node sampling.  We reviewed the plan for a robotic assisted unilateral salpingo-oophorectomy, possible staging, possible laparotomy. The risks of surgery were discussed in detail  and she understands these to include infection; wound separation; hernia; vaginal cuff separation, injury to adjacent organs such as bowel, bladder, blood vessels, ureters and nerves; bleeding which may require blood transfusion; anesthesia risk; thromboembolic events; possible death; unforeseen complications; possible need for re-exploration; medical complications such as heart attack, stroke, pleural effusion and pneumonia. The patient will receive DVT and antibiotic prophylaxis as indicated. She voiced a clear understanding. She had the opportunity to ask questions. Perioperative instructions were reviewed with her. Prescriptions for post-op medications were sent to her pharmacy of choice.  A copy of this note was sent to the patient's referring provider.   60 minutes of total time was spent for this patient encounter, including preparation, face-to-face counseling with the patient and coordination of care, and documentation of the encounter.  Shelby Pinch, MD  Division of Gynecologic Oncology  Department of Obstetrics and Gynecology  [facility]  ___________________________________________  Chief Complaint: No chief complaint on file.   History of Present Illness:  [name] is a 71 y.o. y.o. female who is seen in consultation at the request of Dr. Quincy Barr for an evaluation of elevated androgens.  She initially presented with 6 months of increasing body hair, clitorimegaly.  Today, the patient tells me that before 6-12 months ago, she had almost no hair on her abdomen.  She has been using electrolysis for hair removal on her face, is unsure whether there is been any difference in facial hair.  She also notes noticing that her voice is deeper.  06/09/2022: Pelvic ultrasound exam shows prior hysterectomy, nonvisualization of either ovary.  No adnexal masses or free fluid. 07/09/2022: Pelvic MRI reveals  prior hysterectomy.  Right ovary not visualized but  no adnexal mass identified.  Left ovary appears normal, no ovarian or adnexal mass identified. 09/27/2022: CT of the abdomen and pelvis reveals left ovary without change in size from prior MRI.  Right ovary not visible.  No adrenal lesion.  Liver with question of cirrhotic morphology, no signs of portal hypertension.  Nephrolithiasis within the upper left renal pole.  Postsurgical changes of the rectosigmoid colon and anastomosis.  Labs: Testosterone: 223 on 6/26, 174 on 7/18 Testosterone, free: 1.7 SHBG: 56.8 Androstenedione: 129 Estrogen: 161 Testosterone: 224 DHEA-SO4: 21 17OHP: 94 Cortisol: 152 on 7/18, 1.2 on 7/26  Her surgical history is notable for 2 myomectomies performed through a Pfannenstiel incision followed by 2 C-sections.  She then underwent total hysterectomy with unilateral salpingo-oophorectomy (right side) in 1989.  This was through a Pfannenstiel incision and done in the setting of fibroids with heavy uterine bleeding.  She notes that it was done in a rather emergent fashion.  She developed hot flashes after but cannot remember when they started.  She was never placed on any hormone replacement therapy.  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  In 2021, she underwent panniculectomy.  She reports doing well today.  Denies any abdominal or pelvic pain.  Reports baseline bowel and bladder function.  Denies any recent weight changes.  Endorses a good appetite without nausea or emesis.  PAST MEDICAL HISTORY:  Past Medical History:  Diagnosis Date   Allergy    Anemia    Anxiety    Arthritis    Asthma    no treatment, symptoms with allergies   Back pain    Blood transfusion without reported diagnosis    Cancer of sigmoid colon ([place])    Constipation    resolved with Miralax   Depression    Diabetes mellitus without complication ([place])    Dyspnea    with exertion   Fatigue    Fatty liver    mild   GERD (gastroesophageal  reflux disease)    Grade I diastolic dysfunction    Echo 08/08/2017 with Grade I DD, EF 51%, Mild Aortic regurgitation, Mild Tricuspid Regurgitation, Pulmonary pressure 30 mm Hg. She denies any chest pain, dyspnea, or peripheral edema.   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, some issues with short-term memory   Thyroid disease    Vitamin D deficiency      PAST SURGICAL HISTORY:  Past Surgical History:  Procedure Laterality Date   ABDOMINAL HYSTERECTOMY     ABDOMINOPLASTY/PANNICULECTOMY WITH LIPOSUCTION N/A 09/02/2020   Procedure: PANNICULECTOMY WITH LIPOSUCTION;  Surgeon: Wallace Going, DO;  Location: [facility];  Service: Plastics;  Laterality: N/A;  3 hours, please   BREAST LUMPECTOMY WITH RADIOACTIVE SEED LOCALIZATION Left 08/23/2016   Procedure: LEFT BREAST LUMPECTOMY WITH RADIOACTIVE SEED LOCALIZATION;  Surgeon: Rolm Bookbinder, MD;  Location: [facility];  Service: General;  Laterality: Left;   BREAST SURGERY  2003   reduction   CESAREAN SECTION     x2   CHALAZION EXCISION  Left 2010   COLONOSCOPY W/ POLYPECTOMY  02/16/2018   Dr. Delaney Meigs JOINT INJECTION  2011 and 2012   FLEXIBLE SIGMOIDOSCOPY N/A 04/18/2018   Procedure: FLEXIBLE SIGMOIDOSCOPY;  Surgeon: Ileana Roup, MD;  Location: [facility];  Service: General;  Laterality: N/A;   LAPAROSCOPIC SIGMOID COLECTOMY N/A 04/18/2018   Procedure: LAPAROSCOPIC ASSISTED SIGMOIDECTOMY;  Surgeon: Ileana Roup, MD;  Location: [facility];  Service: General;  Laterality: N/A;   MYOMECTOMY     MYOMECTOMY     x2   NM Roe  03/2009   dipyridamole; normal pattern of perfusion in all regions, post-stress EF  65%, normal study    THYROIDECTOMY  1989   partial   TRANSTHORACIC ECHOCARDIOGRAM  03/2009   EF=>55%, borderline conc LVH; mild mitral annular calcif, trace MR; trace TR; AV mildly sclerotic, mild AV regurg; mild pulm valve regurg    TUBAL LIGATION      OB/GYN HISTORY:  OB History  Gravida Para Term Preterm AB Living  '3 2 2     2  '$ SAB IAB Ectopic Multiple Live Births               # Outcome Date GA Lbr Len/2nd Weight Sex Delivery Anes PTL Lv  3 Gravida           2 Term      CS-Unspec     1 Term      CS-Unspec       No LMP recorded. Patient has had a hysterectomy.  Age at menarche: 11 Age at menopause: Unsure, see HPI for details after her hysterectomy.  Developed hot flashes, but cannot remember when this happened Hx of HRT: Denies Hx of STDs: Yes Last pap: 2014 - NIML, HR HPV negative History of abnormal pap smears: Denies  SCREENING STUDIES:  Last mammogram: 2023  Last colonoscopy: 2023 Last bone mineral density: 2018  MEDICATIONS: Outpatient Encounter Medications as of 10/11/2022  Medication Sig   albuterol (VENTOLIN HFA) 108 (90 Base) MCG/ACT inhaler INHALE 2 PUFFS INTO THE LUNGS EVERY 6 HOURS AS NEEDED FOR WHEEZING OR SHORTNESS OF BREATH   amLODipine (NORVASC) 10 MG tablet Take 1 tablet (10 mg total) by mouth daily.   atorvastatin (LIPITOR) 80 MG tablet Take 1 tablet (80 mg total) by mouth daily.   carvedilol (COREG CR) 20 MG 24 hr capsule Take 1 capsule (20 mg total) by mouth daily.   cetirizine (ZYRTEC) 10 MG tablet Take 10 mg by mouth daily as needed for allergies.   Cholecalciferol (VITAMIN D3) 1.25 MG (50000 UT) CAPS Take 1 capsule by mouth once a week.   desvenlafaxine (PRISTIQ) 100 MG 24 hr tablet Take 100 mg by mouth daily.   Diclofenac Potassium,Migraine, 50 MG PACK Take by mouth.   Fluocinolone Acetonide Body (DERMA-SMOOTHE/FS BODY) 0.01 % OIL Apply to scalp and leave on 8 hours or overnight as needed for eczema   fluticasone (FLONASE) 50 MCG/ACT nasal  spray Place 2 sprays into both nostrils daily.   frovatriptan (FROVA) 2.5 MG tablet Take 2.5 mg by mouth as needed for migraine. If recurs, may repeat after 2 hours. Max of 3 tabs in 24 hours.   gabapentin (NEURONTIN) 300 MG capsule Take 300 mg by mouth 3 (three) times daily as needed (headaches).    ipratropium (ATROVENT) 0.03 % nasal spray Place 2 sprays into the nose 4 (four) times daily. (Patient taking differently: Place 2 sprays into the nose 4 (four) times daily  as needed for rhinitis.)   LORazepam (ATIVAN) 1 MG tablet Take 1 tab orally prior to MRI scan due to claustrophobia.   Misc. Devices MISC Patient to be out of work until next appointment follow in 8 weeks.   OLANZapine (ZYPREXA) 5 MG tablet Take 5 mg by mouth at bedtime.   Olopatadine HCl (PATADAY) 0.2 % SOLN Apply 1 drop daily as needed to eye (for itching).   omeprazole (PRILOSEC) 40 MG capsule Take 40 mg by mouth daily.   polyethylene glycol (MIRALAX / GLYCOLAX) packet Take 17 g by mouth daily.   spironolactone (ALDACTONE) 25 MG tablet Take 1 tablet (25 mg total) by mouth daily.   traMADol (ULTRAM) 50 MG tablet Take 1 tablet (50 mg total) by mouth every 6 (six) hours as needed for severe pain. For AFTER surgery only, do not take and drive   valACYclovir (VALTREX) 1000 MG tablet Take 1 tablet (1,000 mg total) by mouth daily. For suppression   valsartan (DIOVAN) 320 MG tablet TAKE 1 TABLET(320 MG) BY MOUTH DAILY   [DISCONTINUED] ibuprofen (ADVIL,MOTRIN) 200 MG tablet Take 200 mg by mouth 3 (three) times daily as needed for headache or moderate pain.   [DISCONTINUED] LORazepam (ATIVAN) 0.5 MG tablet Use up to 2 tablets before MRI scan for claustrophobia   [DISCONTINUED] naproxen sodium (ALEVE) 220 MG tablet Take 220 mg by mouth daily as needed (pain).   No facility-administered encounter medications on file as of 10/11/2022.    ALLERGIES:  Allergies  Allergen Reactions   Hydrochlorothiazide Other (See Comments)    Increases  risk of gout flares    Botox [Botulinum Toxin Type A] Swelling   Onabotulinumtoxina Swelling   Other Hives    Ubrelvy   Toprol Xl [Metoprolol Succinate] Hives     FAMILY HISTORY:  Family History  Problem Relation Age of Onset   Heart disease Mother        MI, HTN   Hypertension Mother    Thyroid disease Mother    Hyperlipidemia Mother    Sudden death Mother    Anxiety disorder Mother    Glaucoma Father    Diabetes Father    Thyroid disease Sister    Prostate cancer Brother    Heart disease Maternal Grandmother        MI, CVA   Heart disease Maternal Grandfather        MI   Stroke Maternal Grandfather    Heart disease Paternal Grandmother    Stroke Paternal Grandmother    Heart disease Paternal Grandfather        MI   Depression Daughter    ADD / ADHD Son    Heart attack Paternal Uncle        HTN   Hypertension Paternal Uncle    Breast cancer Cousin    Breast cancer Paternal Great-grandmother    Colon cancer Neg Hx    Stomach cancer Neg Hx    Esophageal cancer Neg Hx    Rectal cancer Neg Hx    Colon polyps Neg Hx    Ovarian cancer Neg Hx    Endometrial cancer Neg Hx    Pancreatic cancer Neg Hx      SOCIAL HISTORY:  Social Connections: Not on file    REVIEW OF SYSTEMS:  Denies appetite changes, fevers, chills, fatigue, unexplained weight changes. Denies hearing loss, neck lumps or masses, mouth sores, ringing in ears or voice changes. Denies cough or wheezing.  Denies shortness of breath. Denies chest pain  or palpitations. Denies leg swelling. Denies abdominal distention, pain, blood in stools, constipation, diarrhea, nausea, vomiting, or early satiety. Denies pain with intercourse, dysuria, frequency, hematuria or incontinence. Denies hot flashes, pelvic pain, vaginal bleeding or vaginal discharge.   Denies joint pain, back pain or muscle pain/cramps. Denies itching, rash, or wounds. Denies dizziness, headaches, numbness or seizures. Denies swollen lymph  nodes or glands, denies easy bruising or bleeding. Denies anxiety, depression, confusion, or decreased concentration.  Physical Exam:  Vital Signs for this encounter:  Blood pressure (!) 164/81, pulse 88, temperature 98.9 F (37.2 C), temperature source Oral, resp. rate 16, height 5' 3.78" (1.62 m), weight 206 lb 3.2 oz (93.5 kg), SpO2 95 %. Body mass index is 35.64 kg/m. General: Alert, oriented, no acute distress.  HEENT: Normocephalic, atraumatic. Sclera anicteric.  Chest: Clear to auscultation bilaterally. No wheezes, rhonchi, or rales. Cardiovascular: Regular rate and rhythm, no murmurs, rubs, or gallops.  Abdomen: Obese. Normoactive bowel sounds. Soft, nondistended, nontender to palpation. No masses or hepatosplenomegaly appreciated. No palpable fluid wave.  Large lower abdominal incision. Extremities: Grossly normal range of motion. Warm, well perfused. No edema bilaterally.  Skin: No rashes or lesions.  Lymphatics: No cervical, supraclavicular, or inguinal adenopathy.  GU:  Normal external female genitalia.  Enlarged clitoris measuring approximately 3 x 3 cm.  No lesions. No discharge or bleeding.             Bladder/urethra:  No lesions or masses, well supported bladder             Vagina: Well rugated, no lesions noted.  Physiologic discharge.             Cervix/uterus: Surgically absent.             Adnexa: No masses appreciated.  Rectal: Deferred.   LABORATORY AND RADIOLOGIC DATA:  Outside medical records were reviewed to synthesize the above history, along with the history and physical obtained during the visit.   Lab Results  Component Value Date   WBC 4.3 05/23/2022   HGB 12.6 05/23/2022   HCT 37.7 05/23/2022   PLT 237.0 05/23/2022   GLUCOSE 79 05/23/2022   CHOL 137 05/23/2022   TRIG 77.0 05/23/2022   HDL 50.80 05/23/2022   LDLCALC 71 05/23/2022   ALT 25 05/23/2022   AST 20 05/23/2022   NA 142 05/23/2022   K 3.6 05/23/2022   CL 104 05/23/2022   CREATININE  0.79 05/23/2022   BUN 9 05/23/2022   CO2 32 05/23/2022   TSH 0.74 05/23/2022   INR 1.1 01/09/2009   HGBA1C 5.9 05/23/2022

## 2022-10-11 NOTE — Patient Instructions (Addendum)
Preparing for your Surgery  Plan for surgery on November 16, 2022 with Dr. Jeral Pinch at [place] will be scheduled for robotic assisted laparoscopic unilateral salpingo-oophorectomy (removal of one ovary and fallopian tube through small incisions), possible staging.   Pre-operative Testing -You will receive a phone call from presurgical testing at [facility] to arrange for a pre-operative appointment and lab work.  -Bring your insurance card, copy of an advanced directive if applicable, medication list  -At that visit, you will be asked to sign a consent for a possible blood transfusion in case a transfusion becomes necessary during surgery.  The need for a blood transfusion is rare but having consent is a necessary part of your care.     -You should not be taking blood thinners or aspirin at least ten days prior to surgery unless instructed by your surgeon.  -Do not take supplements such as fish oil (omega 3), red yeast rice, turmeric before your surgery. You want to avoid medications with aspirin in them including headache powders such as BC or Goody's), Excedrin migraine.  Day Before Surgery at [place] will be asked to take in a light diet the day before surgery. You will be advised you can have clear liquids up until 3 hours before your surgery.    Eat a light diet the day before surgery.  Examples including soups, broths, toast, yogurt, mashed potatoes.  AVOID GAS PRODUCING FOODS. Things to avoid include carbonated beverages (fizzy beverages, sodas), raw fruits and raw vegetables (uncooked), or beans.   If your bowels are filled with gas, your surgeon will have difficulty visualizing your pelvic organs which increases your surgical risks.  Your role in recovery Your role is to become active as soon as directed by your doctor, while still giving yourself time to heal.  Rest when you feel tired. You will be asked to do the following in order to speed your  recovery:  - Cough and breathe deeply. This helps to clear and expand your lungs and can prevent pneumonia after surgery.  - [place]. Do mild physical activity. Walking or moving your legs help your circulation and body functions return to normal. Do not try to get up or walk alone the first time after surgery.   -If you develop swelling on one leg or the other, pain in the back of your leg, redness/warmth in one of your legs, please call the office or go to the Emergency Room to have a doppler to rule out a blood clot. For shortness of breath, chest pain-seek care in the Emergency Room as soon as possible. - Actively manage your pain. Managing your pain lets you move in comfort. We will ask you to rate your pain on a scale of zero to 10. It is your responsibility to tell your doctor or nurse where and how much you hurt so your pain can be treated.  Special Considerations -If you are diabetic, you may be placed on insulin after surgery to have closer control over your blood sugars to promote healing and recovery.  This does not mean that you will be discharged on insulin.  If applicable, your oral antidiabetics will be resumed when you are tolerating a solid diet.  -Your final pathology results from surgery should be available around one week after surgery and the results will be relayed to you when available.  -FMLA forms can be faxed to [phone] and please allow 5-7 business days  for completion.  Pain Management After Surgery -You have been prescribed your pain medication (tramadol) and bowel regimen medications before surgery so that you can have these available when you are discharged from the hospital. The pain medication is for use ONLY AFTER surgery and a new prescription will not be given.   -Make sure that you have Tylenol and Ibuprofen IF YOU ARE ABLE TO TAKE THESE MEDICATIONS at home to use on a regular basis after surgery for pain control. We recommend  alternating the medications every hour to six hours since they work differently and are processed in the body differently for pain relief.  -Review the attached handout on narcotic use and their risks and side effects.   Bowel Regimen -You can use miralax you have at home nightly after surgery to prevent constipation especially if you are taking the narcotic pain medication intermittently.  It is important to prevent constipation and drink adequate amounts of liquids. You can stop taking this medication when you are not taking pain medication and you are back on your normal bowel routine.  Risks of Surgery Risks of surgery are low but include bleeding, infection, damage to surrounding structures, re-operation, blood clots, and very rarely death.   Blood Transfusion Information (For the consent to be signed before surgery)  We will be checking your blood type before surgery so in case of emergencies, we will know what type of blood you would need.                                            WHAT IS A BLOOD TRANSFUSION?  A transfusion is the replacement of blood or some of its parts. Blood is made up of multiple cells which provide different functions. Red blood cells carry oxygen and are used for blood loss replacement. White blood cells fight against infection. Platelets control bleeding. Plasma helps clot blood. Other blood products are available for specialized needs, such as hemophilia or other clotting disorders. BEFORE THE TRANSFUSION  Who gives blood for transfusions?  You may be able to donate blood to be used at a later date on yourself (autologous donation). Relatives can be asked to donate blood. This is generally not any safer than if you have received blood from a stranger. The same precautions are taken to ensure safety when a relative's blood is donated. Healthy volunteers who are fully evaluated to make sure their blood is safe. This is blood bank blood. Transfusion therapy  is the safest it has ever been in the practice of medicine. Before blood is taken from a donor, a complete history is taken to make sure that person has no history of diseases nor engages in risky social behavior (examples are intravenous drug use or sexual activity with multiple partners). The donor's travel history is screened to minimize risk of transmitting infections, such as malaria. The donated blood is tested for signs of infectious diseases, such as HIV and hepatitis. The blood is then tested to be sure it is compatible with you in order to minimize the chance of a transfusion reaction. If you or a relative donates blood, this is often done in anticipation of surgery and is not appropriate for emergency situations. It takes many days to process the donated blood. RISKS AND COMPLICATIONS Although transfusion therapy is very safe and saves many lives, the main dangers of transfusion include:  Getting  an infectious disease. Developing a transfusion reaction. This is an allergic reaction to something in the blood you were given. Every precaution is taken to prevent this. The decision to have a blood transfusion has been considered carefully by your caregiver before blood is given. Blood is not given unless the benefits outweigh the risks.  AFTER SURGERY INSTRUCTIONS  Return to work: 4-6 weeks if applicable  Activity: 1. Be up and out of the bed during the day.  Take a nap if needed.  You may walk up steps but be careful and use the hand rail.  Stair climbing will tire you more than you think, you may need to stop part way and rest.   2. No lifting or straining for 6 weeks over 10 pounds. No pushing, pulling, straining for 6 weeks.  3. No driving for around 1 week(s).  Do not drive if you are taking narcotic pain medicine and make sure that your reaction time has returned.   4. You can shower as soon as the next day after surgery. Shower daily.  Use your regular soap and water (not directly on  the incision) and pat your incision(s) dry afterwards; don't rub.  No tub baths or submerging your body in water until cleared by your surgeon. If you have the soap that was given to you by pre-surgical testing that was used before surgery, you do not need to use it afterwards because this can irritate your incisions.   5. No sexual activity and nothing in the vagina for 4 weeks.  6. You may experience a small amount of clear drainage from your incisions, which is normal.  If the drainage persists, increases, or changes color please call the office.  7. Do not use creams, lotions, or ointments such as neosporin on your incisions after surgery until advised by your surgeon because they can cause removal of the dermabond glue on your incisions.    8. Take Tylenol or ibuprofen first for pain if you are able to take these medications and only use narcotic pain medication for severe pain not relieved by the Tylenol or Ibuprofen.  Monitor your Tylenol intake to a max of 4,000 mg in a 24 hour period. You can alternate these medications after surgery.  Diet: 1. Low sodium Heart Healthy Diet is recommended but you are cleared to resume your normal (before surgery) diet after your procedure.  2. It is safe to use a laxative, such as Miralax or Colace, if you have difficulty moving your bowels.   Wound Care: 1. Keep clean and dry.  Shower daily.  Reasons to call the Doctor: Fever - Oral temperature greater than 100.4 degrees Fahrenheit Foul-smelling vaginal discharge Difficulty urinating Nausea and vomiting Increased pain at the site of the incision that is unrelieved with pain medicine. Difficulty breathing with or without chest pain New calf pain especially if only on one side Sudden, continuing increased vaginal bleeding with or without clots.   Contacts: For questions or concerns you should contact:  Dr. Jeral Pinch at [phone]  Joylene John, NP at [phone]  After Hours:  call [phone] and have the GYN Oncologist paged/contacted (after 5 pm or on the weekends).  Messages sent via mychart are for non-urgent matters and are not responded to after hours so for urgent needs, please call the after hours number.

## 2022-10-18 NOTE — Progress Notes (Signed)
Patient here for new patient consultation with Dr. Jeral Pinch and for a pre-operative discussion prior to her scheduled surgery on November 16, 2022. She is scheduled for robotic assisted laparoscopic unilateral salpingo-oophorectomy, possible staging.  The surgery was discussed in detail.  See after visit summary for additional details. Visual aids used to discuss items related to surgery.      Discussed post-op pain management in detail including the aspects of the enhanced recovery pathway.  Advised her that a new prescription would be sent in for tramadol and it is only to be used for after her upcoming surgery.  We discussed the use of tylenol post-op and to monitor for a maximum of 4,000 mg in a 24 hour period. Discussed bowel regimen in detail and patient will continue miralax at home.     Discussed measures to take at home to prevent DVT including frequent mobility.  Reportable signs and symptoms of DVT discussed. Post-operative instructions discussed and expectations for after surgery. Incisional care discussed as well including reportable signs and symptoms including erythema, drainage, wound separation.     5 minutes spent with the patient.  Verbalizing understanding of material discussed. No needs or concerns voiced at the end of the visit.   Advised patient to call for any needs.  Advised that her post-operative medications had been prescribed and could be picked up at any time.    This appointment is included in the global surgical bundle as pre-operative teaching and has no charge.

## 2022-10-18 NOTE — Patient Instructions (Signed)
Preparing for your Surgery   Plan for surgery on November 16, 2022 with Dr. Jeral Pinch at [place] will be scheduled for robotic assisted laparoscopic unilateral salpingo-oophorectomy (removal of one ovary and fallopian tube through small incisions), possible staging.    Pre-operative Testing -You will receive a phone call from presurgical testing at [facility] to arrange for a pre-operative appointment and lab work.   -Bring your insurance card, copy of an advanced directive if applicable, medication list   -At that visit, you will be asked to sign a consent for a possible blood transfusion in case a transfusion becomes necessary during surgery.  The need for a blood transfusion is rare but having consent is a necessary part of your care.      -You should not be taking blood thinners or aspirin at least ten days prior to surgery unless instructed by your surgeon.   -Do not take supplements such as fish oil (omega 3), red yeast rice, turmeric before your surgery. You want to avoid medications with aspirin in them including headache powders such as BC or Goody's), Excedrin migraine.   Day Before Surgery at [place] will be asked to take in a light diet the day before surgery. You will be advised you can have clear liquids up until 3 hours before your surgery.     Eat a light diet the day before surgery.  Examples including soups, broths, toast, yogurt, mashed potatoes.  AVOID GAS PRODUCING FOODS. Things to avoid include carbonated beverages (fizzy beverages, sodas), raw fruits and raw vegetables (uncooked), or beans.    If your bowels are filled with gas, your surgeon will have difficulty visualizing your pelvic organs which increases your surgical risks.   Your role in recovery Your role is to become active as soon as directed by your doctor, while still giving yourself time to heal.  Rest when you feel tired. You will be asked to do the following in order to  speed your recovery:   - Cough and breathe deeply. This helps to clear and expand your lungs and can prevent pneumonia after surgery.  - [place]. Do mild physical activity. Walking or moving your legs help your circulation and body functions return to normal. Do not try to get up or walk alone the first time after surgery.   -If you develop swelling on one leg or the other, pain in the back of your leg, redness/warmth in one of your legs, please call the office or go to the Emergency Room to have a doppler to rule out a blood clot. For shortness of breath, chest pain-seek care in the Emergency Room as soon as possible. - Actively manage your pain. Managing your pain lets you move in comfort. We will ask you to rate your pain on a scale of zero to 10. It is your responsibility to tell your doctor or nurse where and how much you hurt so your pain can be treated.   Special Considerations -If you are diabetic, you may be placed on insulin after surgery to have closer control over your blood sugars to promote healing and recovery.  This does not mean that you will be discharged on insulin.  If applicable, your oral antidiabetics will be resumed when you are tolerating a solid diet.   -Your final pathology results from surgery should be available around one week after surgery and the results will be relayed to you when available.   -  FMLA forms can be faxed to [phone] and please allow 5-7 business days for completion.   Pain Management After Surgery -You have been prescribed your pain medication (tramadol) and bowel regimen medications before surgery so that you can have these available when you are discharged from the hospital. The pain medication is for use ONLY AFTER surgery and a new prescription will not be given.    -Make sure that you have Tylenol and Ibuprofen IF YOU ARE ABLE TO TAKE THESE MEDICATIONS at home to use on a regular basis after surgery for pain control. We  recommend alternating the medications every hour to six hours since they work differently and are processed in the body differently for pain relief.   -Review the attached handout on narcotic use and their risks and side effects.    Bowel Regimen -You can use miralax you have at home nightly after surgery to prevent constipation especially if you are taking the narcotic pain medication intermittently.  It is important to prevent constipation and drink adequate amounts of liquids. You can stop taking this medication when you are not taking pain medication and you are back on your normal bowel routine.   Risks of Surgery Risks of surgery are low but include bleeding, infection, damage to surrounding structures, re-operation, blood clots, and very rarely death.     Blood Transfusion Information (For the consent to be signed before surgery)   We will be checking your blood type before surgery so in case of emergencies, we will know what type of blood you would need.                                             WHAT IS A BLOOD TRANSFUSION?   A transfusion is the replacement of blood or some of its parts. Blood is made up of multiple cells which provide different functions. Red blood cells carry oxygen and are used for blood loss replacement. White blood cells fight against infection. Platelets control bleeding. Plasma helps clot blood. Other blood products are available for specialized needs, such as hemophilia or other clotting disorders. BEFORE THE TRANSFUSION  Who gives blood for transfusions?  You may be able to donate blood to be used at a later date on yourself (autologous donation). Relatives can be asked to donate blood. This is generally not any safer than if you have received blood from a stranger. The same precautions are taken to ensure safety when a relative's blood is donated. Healthy volunteers who are fully evaluated to make sure their blood is safe. This is blood bank  blood. Transfusion therapy is the safest it has ever been in the practice of medicine. Before blood is taken from a donor, a complete history is taken to make sure that person has no history of diseases nor engages in risky social behavior (examples are intravenous drug use or sexual activity with multiple partners). The donor's travel history is screened to minimize risk of transmitting infections, such as malaria. The donated blood is tested for signs of infectious diseases, such as HIV and hepatitis. The blood is then tested to be sure it is compatible with you in order to minimize the chance of a transfusion reaction. If you or a relative donates blood, this is often done in anticipation of surgery and is not appropriate for emergency situations. It takes many days to process the  donated blood. RISKS AND COMPLICATIONS Although transfusion therapy is very safe and saves many lives, the main dangers of transfusion include:  Getting an infectious disease. Developing a transfusion reaction. This is an allergic reaction to something in the blood you were given. Every precaution is taken to prevent this. The decision to have a blood transfusion has been considered carefully by your caregiver before blood is given. Blood is not given unless the benefits outweigh the risks.   AFTER SURGERY INSTRUCTIONS   Return to work: 4-6 weeks if applicable   Activity: 1. Be up and out of the bed during the day.  Take a nap if needed.  You may walk up steps but be careful and use the hand rail.  Stair climbing will tire you more than you think, you may need to stop part way and rest.    2. No lifting or straining for 6 weeks over 10 pounds. No pushing, pulling, straining for 6 weeks.   3. No driving for around 1 week(s).  Do not drive if you are taking narcotic pain medicine and make sure that your reaction time has returned.    4. You can shower as soon as the next day after surgery. Shower daily.  Use your  regular soap and water (not directly on the incision) and pat your incision(s) dry afterwards; don't rub.  No tub baths or submerging your body in water until cleared by your surgeon. If you have the soap that was given to you by pre-surgical testing that was used before surgery, you do not need to use it afterwards because this can irritate your incisions.    5. No sexual activity and nothing in the vagina for 4 weeks.   6. You may experience a small amount of clear drainage from your incisions, which is normal.  If the drainage persists, increases, or changes color please call the office.   7. Do not use creams, lotions, or ointments such as neosporin on your incisions after surgery until advised by your surgeon because they can cause removal of the dermabond glue on your incisions.     8. Take Tylenol or ibuprofen first for pain if you are able to take these medications and only use narcotic pain medication for severe pain not relieved by the Tylenol or Ibuprofen.  Monitor your Tylenol intake to a max of 4,000 mg in a 24 hour period. You can alternate these medications after surgery.   Diet: 1. Low sodium Heart Healthy Diet is recommended but you are cleared to resume your normal (before surgery) diet after your procedure.   2. It is safe to use a laxative, such as Miralax or Colace, if you have difficulty moving your bowels.    Wound Care: 1. Keep clean and dry.  Shower daily.   Reasons to call the Doctor: Fever - Oral temperature greater than 100.4 degrees Fahrenheit Foul-smelling vaginal discharge Difficulty urinating Nausea and vomiting Increased pain at the site of the incision that is unrelieved with pain medicine. Difficulty breathing with or without chest pain New calf pain especially if only on one side Sudden, continuing increased vaginal bleeding with or without clots.   Contacts: For questions or concerns you should contact:   Dr. Jeral Pinch at [phone]    Joylene John, NP at [phone]   After Hours: call [phone] and have the GYN Oncologist paged/contacted (after 5 pm or on the weekends).   Messages sent via mychart are for non-urgent matters and are not  responded to after hours so for urgent needs, please call the after hours number.

## 2022-10-31 NOTE — Patient Instructions (Signed)
SURGICAL WAITING ROOM VISITATION Patients having surgery or a procedure may have no more than 2 support people in the waiting area - these visitors may rotate.   Children under the age of 80 must have an adult with them who is not the patient. If the patient needs to stay at the hospital during part of their recovery, the visitor guidelines for inpatient rooms apply. Pre-op nurse will coordinate an appropriate time for 1 support person to accompany patient in pre-op.  This support person may not rotate.    Please refer to the [facility] website for the visitor guidelines for Inpatients (after your surgery is over and you are in a regular room).       Your procedure is scheduled on:  11/03/2022    Report to [facility] Main Entrance    Report to admitting at  [facility] AM   Call this number if you have problems the morning of surgery [phone]   Do not eat food :After Midnight.               Eat  A LIGHT DIET THE DAY BEFORE SURGERY          Avoid gas producing foods.     After Midnight you may have the following liquids until __ 0515____ AM  DAY OF SURGERY  Water Non-Citrus Juices (without pulp, NO RED) Carbonated Beverages Black Coffee (NO MILK/CREAM OR CREAMERS, sugar ok)  Clear Tea (NO MILK/CREAM OR CREAMERS, sugar ok) regular and decaf                             Plain Jell-O (NO RED)                                           Fruit ices (not with fruit pulp, NO RED)                                     Popsicles (NO RED)                                                               Sports drinks like Gatorade (NO RED)              .            Oral Hygiene is also important to reduce your risk of infection.                                    Remember - BRUSH YOUR TEETH THE MORNING OF SURGERY WITH YOUR REGULAR TOOTHPASTE  DENTURES WILL BE REMOVED PRIOR TO SURGERY PLEASE DO NOT APPLY "Poly grip" OR ADHESIVES!!!   Do NOT smoke after Midnight   Take these  medicines the morning of surgery with A SIP OF WATER:  inhalers as usual and bring, amlodipine, coreg, flonase, omeprazole   DO NOT TAKE ANY ORAL DIABETIC MEDICATIONS DAY OF YOUR SURGERY  Bring CPAP mask and tubing day of  surgery.                              You may not have any metal on your body including hair pins, jewelry, and body piercing             Do not wear make-up, lotions, powders, perfumes/cologne, or deodorant  Do not wear nail polish including gel and S&S, artificial/acrylic nails, or any other type of covering on natural nails including finger and toenails. If you have artificial nails, gel coating, etc. that needs to be removed by a nail salon please have this removed prior to surgery or surgery may need to be canceled/ delayed if the surgeon/ anesthesia feels like they are unable to be safely monitored.   Do not shave  48 hours prior to surgery.               Men may shave face and neck.   Do not bring valuables to the hospital. [place].   Contacts, glasses, dentures or bridgework may not be worn into surgery.   Bring small overnight bag day of surgery.   DO NOT [place]. PHARMACY WILL DISPENSE MEDICATIONS LISTED ON YOUR MEDICATION LIST TO YOU DURING YOUR ADMISSION [place]!    Patients discharged on the day of surgery will not be allowed to drive home.  Someone NEEDS to stay with you for the first 24 hours after anesthesia.   Special Instructions: Bring a copy of your healthcare power of attorney and living will documents the day of surgery if you haven't scanned them before.              Please read over the following fact sheets you were given: IF [place] [phone]   If you received a COVID test during your pre-op visit  it is requested that you wear a mask when out in public, stay away from anyone that may not be  feeling well and notify your surgeon if you develop symptoms. If you test positive for Covid or have been in contact with anyone that has tested positive in the last 10 days please notify you surgeon.    [place] - Preparing for Surgery Before surgery, you can play an important role.  Because skin is not sterile, your skin needs to be as free of germs as possible.  You can reduce the number of germs on your skin by washing with CHG (chlorahexidine gluconate) soap before surgery.  CHG is an antiseptic cleaner which kills germs and bonds with the skin to continue killing germs even after washing. Please DO NOT use if you have an allergy to CHG or antibacterial soaps.  If your skin becomes reddened/irritated stop using the CHG and inform your nurse when you arrive at Short Stay. Do not shave (including legs and underarms) for at least 48 hours prior to the first CHG shower.  You may shave your face/neck. Please follow these instructions carefully:  1.  Shower with CHG Soap the night before surgery and the  morning of Surgery.  2.  If you choose to wash your hair, wash your hair first as usual with your  normal  shampoo.  3.  After you shampoo, rinse your hair and body thoroughly to  remove the  shampoo.                           4.  Use CHG as you would any other liquid soap.  You can apply chg directly  to the skin and wash                       Gently with a scrungie or clean washcloth.  5.  Apply the CHG Soap to your body ONLY FROM THE NECK DOWN.   Do not use on face/ open                           Wound or open sores. Avoid contact with eyes, ears mouth and genitals (private parts).                       Wash face,  Genitals (private parts) with your normal soap.             6.  Wash thoroughly, paying special attention to the area where your surgery  will be performed.  7.  Thoroughly rinse your body with warm water from the neck down.  8.  DO NOT shower/wash with your normal soap after using and  rinsing off  the CHG Soap.                9.  Pat yourself dry with a clean towel.            10.  Wear clean pajamas.            11.  Place clean sheets on your bed the night of your first shower and do not  sleep with pets. Day of Surgery : Do not apply any lotions/deodorants the morning of surgery.  Please wear clean clothes to the hospital/surgery center.  FAILURE TO FOLLOW THESE INSTRUCTIONS MAY RESULT IN THE CANCELLATION OF YOUR SURGERY PATIENT SIGNATURE_________________________________  NURSE SIGNATURE__________________________________  ________________________________________________________________________

## 2022-10-31 NOTE — Progress Notes (Addendum)
Anesthesia Review:  PCP: jessica copland LOV 05/23/22  Cardiologist : Chest x-ray : EKG :11/02/22  Echo : Stress test: Cardiac Cath :  Activity level:  Sleep Study/ CPAP : Fasting Blood Sugar :      / Checks Blood Sugar -- times a day:   Blood Thinner/ Instructions /Last Dose: ASA / Instructions/ Last Dose :   DM- type Hgba1c-

## 2022-11-01 ENCOUNTER — Telehealth: Payer: Self-pay | Admitting: Surgery

## 2022-11-01 NOTE — Telephone Encounter (Signed)
Patient LVM stating her BP had been high and wondered if that would affect surgery. Called patient and she stated yesterday it was elevated at 167/101 and this morning it was 177/111. However, she states she was using an older BP cuff so she got a new one and rechecked and her BP was 142/77. Patient denies any other symptoms or concerns. Advised patient to continue monitoring her BP and to call our office if her numbers go up again.

## 2022-11-02 ENCOUNTER — Telehealth: Payer: Self-pay

## 2022-11-02 ENCOUNTER — Encounter (HOSPITAL_COMMUNITY): Payer: Self-pay

## 2022-11-02 ENCOUNTER — Other Ambulatory Visit: Payer: Self-pay

## 2022-11-02 ENCOUNTER — Encounter (HOSPITAL_COMMUNITY)
Admission: RE | Admit: 2022-11-02 | Discharge: 2022-11-02 | Disposition: A | Discharge status: Home / Self Care | Payer: BC Managed Care – PPO | Source: Ambulatory Visit | Attending: Gynecologic Oncology | Admitting: Gynecologic Oncology

## 2022-11-02 VITALS — BP 149/80 | HR 64 | Temp 98.4°F | Resp 16 | Ht 63.0 in | Wt 204.0 lb

## 2022-11-02 DIAGNOSIS — L680 Hirsutism: Secondary | ICD-10-CM | POA: Insufficient documentation

## 2022-11-02 DIAGNOSIS — K219 Gastro-esophageal reflux disease without esophagitis: Secondary | ICD-10-CM | POA: Diagnosis not present

## 2022-11-02 DIAGNOSIS — N736 Female pelvic peritoneal adhesions (postinfective): Secondary | ICD-10-CM | POA: Diagnosis not present

## 2022-11-02 DIAGNOSIS — F32A Depression, unspecified: Secondary | ICD-10-CM | POA: Diagnosis not present

## 2022-11-02 DIAGNOSIS — Z90710 Acquired absence of both cervix and uterus: Secondary | ICD-10-CM | POA: Diagnosis not present

## 2022-11-02 DIAGNOSIS — Z9079 Acquired absence of other genital organ(s): Secondary | ICD-10-CM | POA: Diagnosis not present

## 2022-11-02 DIAGNOSIS — F419 Anxiety disorder, unspecified: Secondary | ICD-10-CM | POA: Diagnosis not present

## 2022-11-02 DIAGNOSIS — D271 Benign neoplasm of left ovary: Secondary | ICD-10-CM | POA: Diagnosis not present

## 2022-11-02 DIAGNOSIS — Z90721 Acquired absence of ovaries, unilateral: Secondary | ICD-10-CM | POA: Diagnosis not present

## 2022-11-02 DIAGNOSIS — E281 Androgen excess: Secondary | ICD-10-CM | POA: Insufficient documentation

## 2022-11-02 DIAGNOSIS — Z01818 Encounter for other preprocedural examination: Secondary | ICD-10-CM | POA: Insufficient documentation

## 2022-11-02 DIAGNOSIS — K682 Retroperitoneal fibrosis: Secondary | ICD-10-CM | POA: Diagnosis not present

## 2022-11-02 DIAGNOSIS — R1909 Other intra-abdominal and pelvic swelling, mass and lump: Secondary | ICD-10-CM | POA: Diagnosis present

## 2022-11-02 DIAGNOSIS — N9089 Other specified noninflammatory disorders of vulva and perineum: Secondary | ICD-10-CM | POA: Insufficient documentation

## 2022-11-02 DIAGNOSIS — I10 Essential (primary) hypertension: Secondary | ICD-10-CM | POA: Diagnosis not present

## 2022-11-02 DIAGNOSIS — Z79899 Other long term (current) drug therapy: Secondary | ICD-10-CM | POA: Diagnosis not present

## 2022-11-02 HISTORY — DX: Personal history of urinary calculi: Z87.442

## 2022-11-02 HISTORY — DX: Pneumonia, unspecified organism: J18.9

## 2022-11-02 LAB — CBC
HCT: 41.2 % (ref 36.0–46.0)
Hemoglobin: 13.2 g/dL (ref 12.0–15.0)
MCH: 29.6 pg (ref 26.0–34.0)
MCHC: 32 g/dL (ref 30.0–36.0)
MCV: 92.4 fL (ref 80.0–100.0)
Platelets: 259 10*3/uL (ref 150–400)
RBC: 4.46 MIL/uL (ref 3.87–5.11)
RDW: 13.5 % (ref 11.5–15.5)
WBC: 4 10*3/uL (ref 4.0–10.5)
nRBC: 0 % (ref 0.0–0.2)

## 2022-11-02 LAB — COMPREHENSIVE METABOLIC PANEL
ALT: 21 U/L (ref 0–44)
AST: 18 U/L (ref 15–41)
Albumin: 3.6 g/dL (ref 3.5–5.0)
Alkaline Phosphatase: 59 U/L (ref 38–126)
Anion gap: 6 (ref 5–15)
BUN: 19 mg/dL (ref 8–23)
CO2: 27 mmol/L (ref 22–32)
Calcium: 9.2 mg/dL (ref 8.9–10.3)
Chloride: 108 mmol/L (ref 98–111)
Creatinine, Ser: 0.86 mg/dL (ref 0.44–1.00)
GFR, Estimated: >60 mL/min (ref >60)
Glucose, Bld: 104 mg/dL — ABNORMAL HIGH (ref 70–99)
Potassium: 4.1 mmol/L (ref 3.5–5.1)
Sodium: 141 mmol/L (ref 135–145)
Total Bilirubin: 0.8 mg/dL (ref 0.3–1.2)
Total Protein: 6.8 g/dL (ref 6.5–8.1)

## 2022-11-02 LAB — HEMOGLOBIN A1C
Hgb A1c MFr Bld: 5.8 % — ABNORMAL HIGH (ref 4.8–5.6)
Mean Plasma Glucose: 120 mg/dL

## 2022-11-02 LAB — GLUCOSE, CAPILLARY: Glucose-Capillary: 104 mg/dL — ABNORMAL HIGH (ref 70–99)

## 2022-11-02 NOTE — Telephone Encounter (Addendum)
Called patient to review pre-op instructions. Patient aware of instructions regarding arrival time and NPO status for surgery on 11/03/22.  Home B/P's have been  running around 147/80. All questions answered.

## 2022-11-02 NOTE — Anesthesia Preprocedure Evaluation (Signed)
Anesthesia Evaluation  Patient identified by MRN, date of birth, ID band Patient awake    Reviewed: Allergy & Precautions, NPO status , Patient's Chart, lab work & pertinent test results  Airway Mallampati: II  TM Distance: >3 FB Neck ROM: Full    Dental  (+) Dental Advisory Given, Teeth Intact   Pulmonary shortness of breath, pneumonia   Pulmonary exam normal breath sounds clear to auscultation       Cardiovascular hypertension, Pt. on medications and Pt. on home beta blockers Normal cardiovascular exam Rhythm:Regular Rate:Normal     Neuro/Psych  Headaches PSYCHIATRIC DISORDERS Anxiety Depression       GI/Hepatic Neg liver ROS,GERD  ,,  Endo/Other  negative endocrine ROS    Renal/GU negative Renal ROS     Musculoskeletal  (+) Arthritis ,    Abdominal  (+) + obese  Peds  Hematology negative hematology ROS (+)   Anesthesia Other Findings   Reproductive/Obstetrics                             Anesthesia Physical Anesthesia Plan  ASA: 3  Anesthesia Plan: General   Post-op Pain Management: Tylenol PO (pre-op)* and Gabapentin PO (pre-op)*   Induction: Intravenous  PONV Risk Score and Plan: 3 and Ondansetron, Dexamethasone and Treatment may vary due to age or medical condition  Airway Management Planned: Oral ETT  Additional Equipment:   Intra-op Plan:   Post-operative Plan: Extubation in OR  Informed Consent: I have reviewed the patients History and Physical, chart, labs and discussed the procedure including the risks, benefits and alternatives for the proposed anesthesia with the patient or authorized representative who has indicated his/her understanding and acceptance.     Dental advisory given  Plan Discussed with: CRNA  Anesthesia Plan Comments: (2 x PIV)       Anesthesia Quick Evaluation

## 2022-11-03 ENCOUNTER — Ambulatory Visit (HOSPITAL_COMMUNITY)
Admission: RE | Admit: 2022-11-03 | Discharge: 2022-11-03 | Disposition: A | Discharge status: Home / Self Care | Payer: BC Managed Care – PPO | Attending: Gynecologic Oncology | Admitting: Gynecologic Oncology

## 2022-11-03 ENCOUNTER — Other Ambulatory Visit: Payer: Self-pay

## 2022-11-03 ENCOUNTER — Ambulatory Visit (HOSPITAL_COMMUNITY): Payer: BC Managed Care – PPO | Admitting: Anesthesiology

## 2022-11-03 ENCOUNTER — Ambulatory Visit (HOSPITAL_COMMUNITY): Payer: BC Managed Care – PPO | Admitting: Physician Assistant

## 2022-11-03 ENCOUNTER — Encounter (HOSPITAL_COMMUNITY)
Admission: RE | Disposition: A | Discharge status: Home / Self Care | Payer: Self-pay | Source: Home / Self Care | Attending: Gynecologic Oncology

## 2022-11-03 ENCOUNTER — Ambulatory Visit: Payer: BC Managed Care – PPO | Admitting: Student

## 2022-11-03 ENCOUNTER — Encounter (HOSPITAL_COMMUNITY): Payer: Self-pay | Admitting: Gynecologic Oncology

## 2022-11-03 DIAGNOSIS — K682 Retroperitoneal fibrosis: Secondary | ICD-10-CM | POA: Insufficient documentation

## 2022-11-03 DIAGNOSIS — F32A Depression, unspecified: Secondary | ICD-10-CM | POA: Insufficient documentation

## 2022-11-03 DIAGNOSIS — Z90710 Acquired absence of both cervix and uterus: Secondary | ICD-10-CM | POA: Insufficient documentation

## 2022-11-03 DIAGNOSIS — D3912 Neoplasm of uncertain behavior of left ovary: Secondary | ICD-10-CM

## 2022-11-03 DIAGNOSIS — Z01818 Encounter for other preprocedural examination: Secondary | ICD-10-CM

## 2022-11-03 DIAGNOSIS — N736 Female pelvic peritoneal adhesions (postinfective): Secondary | ICD-10-CM | POA: Insufficient documentation

## 2022-11-03 DIAGNOSIS — Z79899 Other long term (current) drug therapy: Secondary | ICD-10-CM | POA: Insufficient documentation

## 2022-11-03 DIAGNOSIS — N9489 Other specified conditions associated with female genital organs and menstrual cycle: Secondary | ICD-10-CM | POA: Diagnosis not present

## 2022-11-03 DIAGNOSIS — Z90721 Acquired absence of ovaries, unilateral: Secondary | ICD-10-CM | POA: Insufficient documentation

## 2022-11-03 DIAGNOSIS — E281 Androgen excess: Secondary | ICD-10-CM | POA: Diagnosis not present

## 2022-11-03 DIAGNOSIS — I10 Essential (primary) hypertension: Secondary | ICD-10-CM | POA: Insufficient documentation

## 2022-11-03 DIAGNOSIS — K660 Peritoneal adhesions (postprocedural) (postinfection): Secondary | ICD-10-CM | POA: Diagnosis not present

## 2022-11-03 DIAGNOSIS — N9089 Other specified noninflammatory disorders of vulva and perineum: Secondary | ICD-10-CM

## 2022-11-03 DIAGNOSIS — F419 Anxiety disorder, unspecified: Secondary | ICD-10-CM | POA: Insufficient documentation

## 2022-11-03 DIAGNOSIS — L680 Hirsutism: Secondary | ICD-10-CM

## 2022-11-03 DIAGNOSIS — Z9079 Acquired absence of other genital organ(s): Secondary | ICD-10-CM | POA: Insufficient documentation

## 2022-11-03 DIAGNOSIS — D271 Benign neoplasm of left ovary: Secondary | ICD-10-CM | POA: Diagnosis not present

## 2022-11-03 DIAGNOSIS — K219 Gastro-esophageal reflux disease without esophagitis: Secondary | ICD-10-CM | POA: Insufficient documentation

## 2022-11-03 HISTORY — PX: ROBOTIC ASSISTED LAPAROSCOPIC LYSIS OF ADHESION: SHX6080

## 2022-11-03 HISTORY — PX: ROBOTIC ASSISTED SALPINGO OOPHERECTOMY: SHX6082

## 2022-11-03 LAB — TYPE AND SCREEN
ABO/RH(D): A POS
Antibody Screen: NEGATIVE

## 2022-11-03 SURGERY — SALPINGO-OOPHORECTOMY, ROBOT-ASSISTED
Anesthesia: General

## 2022-11-03 MED ORDER — LIDOCAINE HCL (PF) 2 % IJ SOLN
INTRAMUSCULAR | Status: AC
  Filled 2022-11-03: qty 10

## 2022-11-03 MED ORDER — ROCURONIUM BROMIDE 100 MG/10ML IV SOLN
INTRAVENOUS | Status: DC | PRN
  Administered 2022-11-03: 60 mg via INTRAVENOUS
  Administered 2022-11-03 (×2): 20 mg via INTRAVENOUS

## 2022-11-03 MED ORDER — LACTATED RINGERS IV SOLN: INTRAVENOUS | Status: DC | PRN

## 2022-11-03 MED ORDER — LIDOCAINE HCL (PF) 2 % IJ SOLN
INTRAMUSCULAR | Status: AC
  Filled 2022-11-03: qty 5

## 2022-11-03 MED ORDER — LACTATED RINGERS IV SOLN: INTRAVENOUS | Status: DC

## 2022-11-03 MED ORDER — GABAPENTIN 300 MG PO CAPS
300.0000 mg | ORAL_CAPSULE | Freq: Once | ORAL | Status: AC
  Administered 2022-11-03: 300 mg via ORAL
  Filled 2022-11-03: qty 1

## 2022-11-03 MED ORDER — MIDAZOLAM HCL 5 MG/5ML IJ SOLN
INTRAMUSCULAR | Status: DC | PRN
  Administered 2022-11-03: 2 mg via INTRAVENOUS

## 2022-11-03 MED ORDER — ACETAMINOPHEN 500 MG PO TABS: 1000.0000 mg | ORAL_TABLET | Freq: Once | ORAL | Status: DC

## 2022-11-03 MED ORDER — HYDROMORPHONE HCL 1 MG/ML IJ SOLN
INTRAMUSCULAR | Status: AC
  Administered 2022-11-03: 0.25 mg via INTRAVENOUS
  Filled 2022-11-03: qty 1

## 2022-11-03 MED ORDER — MEPERIDINE HCL 50 MG/ML IJ SOLN
INTRAMUSCULAR | Status: AC
  Filled 2022-11-03: qty 1

## 2022-11-03 MED ORDER — HEMOSTATIC AGENTS (NO CHARGE) OPTIME
TOPICAL | Status: DC | PRN
  Administered 2022-11-03: 1 via TOPICAL

## 2022-11-03 MED ORDER — PHENYLEPHRINE 80 MCG/ML (10ML) SYRINGE FOR IV PUSH (FOR BLOOD PRESSURE SUPPORT)
PREFILLED_SYRINGE | INTRAVENOUS | Status: AC
  Filled 2022-11-03: qty 10

## 2022-11-03 MED ORDER — CHLORHEXIDINE GLUCONATE 0.12 % MT SOLN: 15.0000 mL | Freq: Once | OROMUCOSAL | Status: DC

## 2022-11-03 MED ORDER — GLYCOPYRROLATE 0.2 MG/ML IJ SOLN
INTRAMUSCULAR | Status: DC | PRN
  Administered 2022-11-03: .2 mg via INTRAVENOUS

## 2022-11-03 MED ORDER — BUPIVACAINE HCL 0.25 % IJ SOLN
INTRAMUSCULAR | Status: AC
  Filled 2022-11-03: qty 1

## 2022-11-03 MED ORDER — HYDROMORPHONE HCL 1 MG/ML IJ SOLN
0.2500 mg | INTRAMUSCULAR | Status: DC | PRN
  Administered 2022-11-03: 0.5 mg via INTRAVENOUS
  Administered 2022-11-03: 0.25 mg via INTRAVENOUS

## 2022-11-03 MED ORDER — MEPERIDINE HCL 50 MG/ML IJ SOLN
6.2500 mg | INTRAMUSCULAR | Status: DC | PRN
  Administered 2022-11-03: 12.5 mg via INTRAVENOUS

## 2022-11-03 MED ORDER — PROPOFOL 10 MG/ML IV BOLUS
INTRAVENOUS | Status: DC | PRN
  Administered 2022-11-03: 140 mg via INTRAVENOUS

## 2022-11-03 MED ORDER — ROCURONIUM BROMIDE 10 MG/ML (PF) SYRINGE
PREFILLED_SYRINGE | INTRAVENOUS | Status: AC
  Filled 2022-11-03: qty 20

## 2022-11-03 MED ORDER — FENTANYL CITRATE (PF) 100 MCG/2ML IJ SOLN
INTRAMUSCULAR | Status: AC
  Filled 2022-11-03: qty 2

## 2022-11-03 MED ORDER — SUGAMMADEX SODIUM 200 MG/2ML IV SOLN
INTRAVENOUS | Status: DC | PRN
  Administered 2022-11-03: 200 mg via INTRAVENOUS

## 2022-11-03 MED ORDER — PROPOFOL 10 MG/ML IV BOLUS
INTRAVENOUS | Status: AC
  Filled 2022-11-03: qty 20

## 2022-11-03 MED ORDER — FENTANYL CITRATE (PF) 100 MCG/2ML IJ SOLN
INTRAMUSCULAR | Status: DC | PRN
  Administered 2022-11-03 (×4): 50 ug via INTRAVENOUS

## 2022-11-03 MED ORDER — ONDANSETRON HCL 4 MG/2ML IJ SOLN
INTRAMUSCULAR | Status: DC | PRN
  Administered 2022-11-03: 4 mg via INTRAVENOUS

## 2022-11-03 MED ORDER — BUPIVACAINE HCL 0.25 % IJ SOLN
INTRAMUSCULAR | Status: DC | PRN
  Administered 2022-11-03: 30 mL

## 2022-11-03 MED ORDER — ONDANSETRON HCL 4 MG/2ML IJ SOLN
INTRAMUSCULAR | Status: AC
  Filled 2022-11-03: qty 2

## 2022-11-03 MED ORDER — LACTATED RINGERS IR SOLN
Status: DC | PRN
  Administered 2022-11-03: 1000 mL

## 2022-11-03 MED ORDER — DEXAMETHASONE SODIUM PHOSPHATE 10 MG/ML IJ SOLN
INTRAMUSCULAR | Status: DC | PRN
  Administered 2022-11-03: 4 mg via INTRAVENOUS

## 2022-11-03 MED ORDER — PROMETHAZINE HCL 25 MG/ML IJ SOLN: 6.2500 mg | INTRAMUSCULAR | Status: DC | PRN

## 2022-11-03 MED ORDER — HEPARIN SODIUM (PORCINE) 5000 UNIT/ML IJ SOLN
5000.0000 [IU] | INTRAMUSCULAR | Status: AC
  Administered 2022-11-03: 5000 [IU] via SUBCUTANEOUS
  Filled 2022-11-03: qty 1

## 2022-11-03 MED ORDER — ORAL CARE MOUTH RINSE: 15.0000 mL | Freq: Once | OROMUCOSAL | Status: DC

## 2022-11-03 MED ORDER — STERILE WATER FOR IRRIGATION IR SOLN
Status: DC | PRN
  Administered 2022-11-03: 1000 mL

## 2022-11-03 MED ORDER — LIDOCAINE HCL (CARDIAC) PF 100 MG/5ML IV SOSY
PREFILLED_SYRINGE | INTRAVENOUS | Status: DC | PRN
  Administered 2022-11-03: 80 mg via INTRATRACHEAL

## 2022-11-03 MED ORDER — MIDAZOLAM HCL 2 MG/2ML IJ SOLN
INTRAMUSCULAR | Status: AC
  Filled 2022-11-03: qty 2

## 2022-11-03 MED ORDER — DEXAMETHASONE SODIUM PHOSPHATE 4 MG/ML IJ SOLN: 4.0000 mg | INTRAMUSCULAR | Status: DC

## 2022-11-03 MED ORDER — DEXAMETHASONE SODIUM PHOSPHATE 10 MG/ML IJ SOLN
INTRAMUSCULAR | Status: AC
  Filled 2022-11-03: qty 1

## 2022-11-03 MED ORDER — ACETAMINOPHEN 500 MG PO TABS
1000.0000 mg | ORAL_TABLET | ORAL | Status: AC
  Administered 2022-11-03: 1000 mg via ORAL
  Filled 2022-11-03: qty 2

## 2022-11-03 SURGICAL SUPPLY — 77 items
ADH SKN CLS APL DERMABOND .7 (GAUZE/BANDAGES/DRESSINGS) ×2
AGENT HMST KT MTR STRL THRMB (HEMOSTASIS)
APL ESCP 34 STRL LF DISP (HEMOSTASIS)
APL SRG 38 LTWT LNG FL B (MISCELLANEOUS) ×2
APPLICATOR ARISTA FLEXITIP XL (MISCELLANEOUS) IMPLANT
APPLICATOR SURGIFLO ENDO (HEMOSTASIS) IMPLANT
BAG LAPAROSCOPIC 12 15 PORT 16 (BASKET) IMPLANT
BAG RETRIEVAL 12/15 (BASKET)
BLADE SURG SZ10 CARB STEEL (BLADE) IMPLANT
CATH FOLEY 2WAY SLVR  5CC 14FR (CATHETERS) ×2
CATH FOLEY 2WAY SLVR 5CC 14FR (CATHETERS) IMPLANT
COVER BACK TABLE 60X90IN (DRAPES) ×2 IMPLANT
COVER TIP SHEARS 8 DVNC (MISCELLANEOUS) ×2 IMPLANT
COVER TIP SHEARS 8MM DA VINCI (MISCELLANEOUS) ×2
DERMABOND ADVANCED .7 DNX12 (GAUZE/BANDAGES/DRESSINGS) ×2 IMPLANT
DRAPE ARM DVNC X/XI (DISPOSABLE) ×8 IMPLANT
DRAPE COLUMN DVNC XI (DISPOSABLE) ×2 IMPLANT
DRAPE DA VINCI XI ARM (DISPOSABLE) ×8
DRAPE DA VINCI XI COLUMN (DISPOSABLE) ×2
DRAPE SHEET LG 3/4 BI-LAMINATE (DRAPES) ×2 IMPLANT
DRAPE SURG IRRIG POUCH 19X23 (DRAPES) ×2 IMPLANT
DRSG OPSITE POSTOP 4X6 (GAUZE/BANDAGES/DRESSINGS) IMPLANT
DRSG OPSITE POSTOP 4X8 (GAUZE/BANDAGES/DRESSINGS) IMPLANT
ELECT PENCIL ROCKER SW 15FT (MISCELLANEOUS) IMPLANT
ELECT REM PT RETURN 15FT ADLT (MISCELLANEOUS) ×2 IMPLANT
GAUZE 4X4 16PLY ~~LOC~~+RFID DBL (SPONGE) ×4 IMPLANT
GLOVE BIO SURGEON STRL SZ 6 (GLOVE) ×8 IMPLANT
GLOVE BIO SURGEON STRL SZ 6.5 (GLOVE) ×2 IMPLANT
GOWN STRL REUS W/ TWL LRG LVL3 (GOWN DISPOSABLE) ×8 IMPLANT
GOWN STRL REUS W/TWL LRG LVL3 (GOWN DISPOSABLE) ×10
GRASPER SUT TROCAR 14GX15 (MISCELLANEOUS) IMPLANT
HEMOSTAT ARISTA ABSORB 3G PWDR (HEMOSTASIS) IMPLANT
HOLDER FOLEY CATH W/STRAP (MISCELLANEOUS) IMPLANT
IRRIG SUCT STRYKERFLOW 2 WTIP (MISCELLANEOUS) ×2
IRRIGATION SUCT STRKRFLW 2 WTP (MISCELLANEOUS) ×2 IMPLANT
KIT TURNOVER KIT A (KITS) IMPLANT
LIGASURE IMPACT 36 18CM CVD LR (INSTRUMENTS) IMPLANT
MANIPULATOR ADVINCU DEL 3.0 PL (MISCELLANEOUS) IMPLANT
MANIPULATOR ADVINCU DEL 3.5 PL (MISCELLANEOUS) IMPLANT
MANIPULATOR UTERINE 4.5 ZUMI (MISCELLANEOUS) IMPLANT
NDL HYPO 21X1.5 SAFETY (NEEDLE) ×2 IMPLANT
NEEDLE HYPO 21X1.5 SAFETY (NEEDLE) ×2 IMPLANT
OBTURATOR OPTICAL STANDARD 8MM (TROCAR) ×2
OBTURATOR OPTICAL STND 8 DVNC (TROCAR) ×2
OBTURATOR OPTICALSTD 8 DVNC (TROCAR) ×2 IMPLANT
PACK ROBOT GYN CUSTOM WL (TRAY / TRAY PROCEDURE) ×2 IMPLANT
PAD POSITIONING PINK XL (MISCELLANEOUS) ×2 IMPLANT
PORT ACCESS TROCAR AIRSEAL 12 (TROCAR) IMPLANT
SEAL CANN UNIV 5-8 DVNC XI (MISCELLANEOUS) ×6 IMPLANT
SEAL XI 5MM-8MM UNIVERSAL (MISCELLANEOUS) ×8
SET TRI-LUMEN FLTR TB AIRSEAL (TUBING) ×2 IMPLANT
SOL PREP POV-IOD 4OZ 10% (MISCELLANEOUS) ×4 IMPLANT
SPIKE FLUID TRANSFER (MISCELLANEOUS) ×2 IMPLANT
SPONGE T-LAP 18X18 ~~LOC~~+RFID (SPONGE) IMPLANT
SURGIFLO W/THROMBIN 8M KIT (HEMOSTASIS) IMPLANT
SUT MNCRL AB 4-0 PS2 18 (SUTURE) IMPLANT
SUT PDS AB 1 TP1 96 (SUTURE) IMPLANT
SUT V-LOC 180 0-0 GS22 (SUTURE) IMPLANT
SUT VIC AB 0 CT1 27 (SUTURE)
SUT VIC AB 0 CT1 27XBRD ANTBC (SUTURE) IMPLANT
SUT VIC AB 2-0 CT1 27 (SUTURE)
SUT VIC AB 2-0 CT1 TAPERPNT 27 (SUTURE) IMPLANT
SUT VIC AB 4-0 PS2 18 (SUTURE) ×4 IMPLANT
SUT VLOC 180 0 9IN  GS21 (SUTURE)
SUT VLOC 180 0 9IN GS21 (SUTURE) IMPLANT
SYR 10ML LL (SYRINGE) IMPLANT
SYS BAG RETRIEVAL 10MM (BASKET)
SYS WOUND ALEXIS 18CM MED (MISCELLANEOUS)
SYSTEM BAG RETRIEVAL 10MM (BASKET) IMPLANT
SYSTEM WOUND ALEXIS 18CM MED (MISCELLANEOUS) IMPLANT
TOWEL OR NON WOVEN STRL DISP B (DISPOSABLE) IMPLANT
TRAP SPECIMEN MUCUS 40CC (MISCELLANEOUS) IMPLANT
TRAY FOLEY MTR SLVR 16FR STAT (SET/KITS/TRAYS/PACK) ×2 IMPLANT
TROCAR PORT AIRSEAL 5X120 (TROCAR) IMPLANT
UNDERPAD 30X36 HEAVY ABSORB (UNDERPADS AND DIAPERS) ×4 IMPLANT
WATER STERILE IRR 1000ML POUR (IV SOLUTION) ×2 IMPLANT
YANKAUER SUCT BULB TIP 10FT TU (MISCELLANEOUS) IMPLANT

## 2022-11-03 NOTE — Transfer of Care (Signed)
Immediate Anesthesia Transfer of Care Note  Patient: [name]  Procedure(s) Performed: XI ROBOTIC ASSISTED UNILATERAL SALPINGO OOPHORECTOMY, CYSTOSCOPY (Left) XI ROBOTIC ASSISTED LAPAROSCOPIC LYSIS OF ADHESION  Patient Location: PACU  Anesthesia Type:General  Level of Consciousness: awake, alert , oriented, and patient cooperative  Airway & Oxygen Therapy: Patient Spontanous Breathing and Patient connected to face mask oxygen  Post-op Assessment: Report given to RN, Post -op Vital signs reviewed and stable, and Patient moving all extremities X 4  Post vital signs: Reviewed and stable  Last Vitals:  Vitals Value Taken Time  BP 134/93 11/03/22 1137  Temp    Pulse 62 11/03/22 1140  Resp    SpO2 98 % 11/03/22 1140  Vitals shown include unvalidated device data.  Last Pain:  Vitals:   11/03/22 0646  TempSrc: Oral  PainSc: 0-No pain      Patients Stated Pain Goal: 5 (33/82/50 5397)  Complications: No notable events documented.

## 2022-11-03 NOTE — Anesthesia Postprocedure Evaluation (Signed)
Anesthesia Post Note  Patient: [name]  Procedure(s) Performed: XI ROBOTIC ASSISTED UNILATERAL SALPINGO OOPHORECTOMY, CYSTOSCOPY (Left) XI ROBOTIC ASSISTED LAPAROSCOPIC LYSIS OF ADHESION     Patient location during evaluation: PACU Anesthesia Type: General Level of consciousness: sedated and patient cooperative Pain management: pain level controlled Vital Signs Assessment: post-procedure vital signs reviewed and stable Respiratory status: spontaneous breathing Cardiovascular status: stable Anesthetic complications: no   No notable events documented.  Last Vitals:  Vitals:   11/03/22 1230 11/03/22 1254  BP: (!) 154/82 (!) 148/81  Pulse: (!) 51 64  Resp: 18 18  Temp: 36.6 C 36.6 C  SpO2: 98% 99%    Last Pain:  Vitals:   11/03/22 1254  TempSrc: Oral  PainSc: 0-No pain                 Nolon Nations

## 2022-11-03 NOTE — Anesthesia Procedure Notes (Signed)
Procedure Name: Intubation Date/Time: 11/03/2022 9:07 AM  Performed by: Jonna Munro, CRNAPre-anesthesia Checklist: Patient identified, Emergency Drugs available, Suction available, Patient being monitored and Timeout performed Patient Re-evaluated:Patient Re-evaluated prior to induction Oxygen Delivery Method: Circle system utilized Preoxygenation: Pre-oxygenation with 100% oxygen Induction Type: IV induction Ventilation: Mask ventilation without difficulty Laryngoscope Size: Mac and 3 Grade View: Grade I Tube type: Oral Tube size: 7.0 mm Number of attempts: 1 Airway Equipment and Method: Stylet Placement Confirmation: ETT inserted through vocal cords under direct vision, positive ETCO2, CO2 detector and breath sounds checked- equal and bilateral Secured at: 22 cm Tube secured with: Tape Dental Injury: Teeth and Oropharynx as per pre-operative assessment

## 2022-11-03 NOTE — Op Note (Signed)
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  On intra-abdominal entry, normal upper abdominal survey.  Loop of ileum as well as redundant sigmoid both with adhesions to the left pelvic sidewall and pelvis.  Sigmoid colon adherent densely along the left pelvic sidewall, at presumed site of prior resection and reanastomosis.  Right ovary and tube surgically absent.  Left ovary normal in size, ovarian vasculature significantly engorged.  Tube itself not appreciated but fimbriated end normal in appearance.  No ascites.  No pelvic or abdominal evidence of peritoneal disease.  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 On frozen section, benign sex cord stromal tumor noted. On cystoscopy, bladder dome intact, good efflux noted from bilateral ureteral orifices.  Estimated Blood Loss:  50 cc      Total IV Fluids: see I&O flowsheet         Specimens: Pelvic washings, left tube and ovary         Complications:  None apparent; patient tolerated the procedure well.         Disposition: PACU - hemodynamically stable.  Procedure Details  The patient was seen in the Holding Room. The risks, benefits, complications, treatment options, and expected outcomes were discussed with the patient.  The patient concurred with the proposed plan, giving informed consent.  The site of surgery properly noted/marked. The patient was identified as [name] and the procedure verified as a Robotic-assisted unilateral salpingo-oophorectomy with any other indicated  procedures.   After induction of anesthesia, the patient was draped and prepped in the usual sterile manner. Patient was placed in supine position after anesthesia and draped and prepped in the usual sterile manner as follows: Her arms were tucked to her side with all appropriate precautions.  The patient was secured to the bed using padding and tape across her chest.  The patient was placed in the semi-lithotomy position in [place].  The perineum and vagina were prepped with Betadine. The patient's abdomen was prepped with ChloraPrep and then she was draped after the prep had been allowed to dry for 3 minutes.  A Time Out was held and the above information confirmed.  The urethra was prepped with Betadine. Foley catheter was placed. OG tube placement was confirmed and to suction.   Next, a 5 mm skin incision was made 1 cm below the subcostal margin in the midclavicular line.  The 5 mm Optiview port and scope was used for direct entry.  Opening pressure was under 10 mm CO2.  The abdomen was insufflated and the findings were noted as above.   At this point and all points during the procedure, the patient's intra-abdominal pressure did not exceed 15 mmHg. Next, an 8 mm skin incision was made superior to the umbilicus and a right and left port were placed about 8 cm lateral to the robot port on the right and left side.  A fourth arm was placed on the right.  The 5 mm assist trocar was exchanged for a 8 mm Airseal port. All ports were placed under direct visualization.  The patient was placed in steep Trendelenburg.  The robot was docked in the normal manner.  Pelvic washings were collected.  Attention was turned to the left.  Adhesions of the redundant sigmoid as well as loop of small bowel were meticulously lysed with sharp dissection.  Once mobilized sufficiently, the sigmoid colon and mesentery at the presumed site of prior anastomosis was noted to be quite adherent to the left pelvic sidewall.   Attention was then turned to the sidewall and the left peritoneum was opened parallel to the IP ligament to open the retroperitoneal space.  This dissection was carried more cephalad than normal to help mobilize the colon.  Attention was then turned back to the sigmoid colon along the left pelvic sidewall.  Very slow, meticulous sharp dissection was used to further mobilize the colon from the sidewall.  Given retroperitoneal fibrosis at the level of the pelvic brim, there was difficulty identifying the ureter here.  The IP ligament, with engorged vessels, was carefully isolated, skeletonized, cauterized, and transected.   With upward traction on the tube and ovary, combination of sharp dissection and short burst of electrocautery were used to lyse the peritoneal connection along the broad ligament, until the adnexa was completely freed.  Once freed, it was placed in a 5 mm Endo Catch bag inserted through the left lateral robotic trocar.  Once the tube and ovary were removed, they were sent to pathology for frozen section.  To remove the Endo Catch bag containing the adnexa, this incision had to be stretched slightly.  The left lateral robotic trocar was then replaced under direct visualization.  The pelvis was inspected again.  The loop of small bowel was further mobilized from its more distal attachment along the vaginal cuff and bladder peritoneum.  All surgical sites were noted to be hemostatic.  Pelvis was irrigated.  Attention was then turned below and cystoscopy was performed after the bladder was instilled with 180 cc of sterile fluid and the Foley catheter removed.  Cystoscopy findings as noted above.  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  Pelvis was irrigated again.  Arista was then placed within the surgical resection along the left pelvic sidewall.  At this point in the procedure was completed.  Robotic instruments were removed under direct visulaization.  The robot was  undocked. The fascia at the left lateral port was closed with 0 Vicryl using a PMI fascial closure device under direct visualization.  The subcuticular tissue all incisions was closed with 4-0 Vicryl and the skin was closed with 4-0 Monocryl in a subcuticular manner.  Dermabond was applied.    Foley catheter had already been removed at the time of cystoscopy.  All sponge, lap and needle counts were correct x  3.   The patient was transferred to the recovery room in stable condition.  Jeral Pinch, MD

## 2022-11-03 NOTE — Interval H&P Note (Signed)
History and Physical Interval Note:  11/03/2022 7:02 AM  [name]  has presented today for surgery, with the diagnosis of [place].  The various methods of treatment have been discussed with the patient and family. After consideration of risks, benefits and other options for treatment, the patient has consented to  Procedure(s): XI ROBOTIC [place]; POSSIBLE STAGING (N/A) as a surgical intervention.  The patient's history has been reviewed, patient examined, no change in status, stable for surgery.  I have reviewed the patient's chart and labs.  Questions were answered to the patient's satisfaction.     [name]

## 2022-11-03 NOTE — Discharge Instructions (Addendum)
AFTER SURGERY INSTRUCTIONS   Return to work: 4-6 weeks if applicable   Activity: 1. Be up and out of the bed during the day.  Take a nap if needed.  You may walk up steps but be careful and use the hand rail.  Stair climbing will tire you more than you think, you may need to stop part way and rest.    2. No lifting or straining for 6 weeks over 10 pounds. No pushing, pulling, straining for 6 weeks.   3. No driving for around 1 week(s).  Do not drive if you are taking narcotic pain medicine and make sure that your reaction time has returned.    4. You can shower as soon as the next day after surgery. Shower daily.  Use your regular soap and water (not directly on the incision) and pat your incision(s) dry afterwards; don't rub.  No tub baths or submerging your body in water until cleared by your surgeon. If you have the soap that was given to you by pre-surgical testing that was used before surgery, you do not need to use it afterwards because this can irritate your incisions.    5. No sexual activity and nothing in the vagina for 4 weeks.   6. You may experience a small amount of clear drainage from your incisions, which is normal.  If the drainage persists, increases, or changes color please call the office.   7. Do not use creams, lotions, or ointments such as neosporin on your incisions after surgery until advised by your surgeon because they can cause removal of the dermabond glue on your incisions.     8. Take Tylenol or ibuprofen first for pain if you are able to take these medications and only use narcotic pain medication for severe pain not relieved by the Tylenol or Ibuprofen.  Monitor your Tylenol intake to a max of 4,000 mg in a 24 hour period. You can alternate these medications after surgery.   Diet: 1. Low sodium Heart Healthy Diet is recommended but you are cleared to resume your normal (before surgery) diet after your procedure.   2. It is safe to use a laxative, such as  Miralax or Colace, if you have difficulty moving your bowels.    Wound Care: 1. Keep clean and dry.  Shower daily.   Reasons to call the Doctor: Fever - Oral temperature greater than 100.4 degrees Fahrenheit Foul-smelling vaginal discharge Difficulty urinating Nausea and vomiting Increased pain at the site of the incision that is unrelieved with pain medicine. Difficulty breathing with or without chest pain New calf pain especially if only on one side Sudden, continuing increased vaginal bleeding with or without clots.   Contacts: For questions or concerns you should contact:   Dr. Jeral Pinch at [phone]   Joylene John, NP at [phone]   After Hours: call [phone] and have the GYN Oncologist paged/contacted (after 5 pm or on the weekends).   Messages sent via mychart are for non-urgent matters and are not responded to after hours so for urgent needs, please call the after hours number.

## 2022-11-04 ENCOUNTER — Encounter (HOSPITAL_COMMUNITY): Payer: Self-pay | Admitting: Gynecologic Oncology

## 2022-11-04 ENCOUNTER — Telehealth: Payer: Self-pay

## 2022-11-04 LAB — CYTOLOGY - NON PAP

## 2022-11-04 NOTE — Telephone Encounter (Signed)
Spoke with Mrs. Prude this morning. She states that she is eating, drinking and urinating well. She has not had a BM yet but she has Miralax if needed. She will increase her fluid intake. She denies fever or chills. Incisions are dry and intact. Her pain is controlled with Tylenol.   Reviewed list of  reasons to call office including fever, chills, nausea, vomiting, calf pain and increased bleeding. Reminded patient about lifting restrictions and vaginal restrictions as stated on discharge instructions. She has minimal spotting at present.  Reviewed after hours number for questions or concerns. [phone]. Request GYN surgeon on call.  Patient aware of her post-op appointment with Dr. Berline Lopes.

## 2022-11-08 ENCOUNTER — Encounter (HOSPITAL_COMMUNITY): Payer: BC Managed Care – PPO

## 2022-11-08 LAB — SURGICAL PATHOLOGY

## 2022-11-09 ENCOUNTER — Telehealth: Payer: Self-pay | Admitting: Gynecologic Oncology

## 2022-11-09 NOTE — Telephone Encounter (Signed)
Called patient. Discussed pathology. 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 Will plan to check hormone levels when I see her back for follow-up.  Jeral Pinch MD Gynecologic Oncology

## 2022-11-16 DIAGNOSIS — E281 Androgen excess: Secondary | ICD-10-CM

## 2022-11-16 DIAGNOSIS — N9089 Other specified noninflammatory disorders of vulva and perineum: Secondary | ICD-10-CM

## 2022-11-16 DIAGNOSIS — L680 Hirsutism: Secondary | ICD-10-CM

## 2022-11-23 ENCOUNTER — Telehealth: Payer: BC Managed Care – PPO | Admitting: Gynecologic Oncology

## 2022-12-05 NOTE — Progress Notes (Unsigned)
Cardiology Office Note:    Date:  12/05/2022   ID:  [name], DOB March 09, 1951, MRN [id number]  PCP:  Darreld Mclean, MD [place] Cardiologist: Pixie Casino, MD   Reason for visit: 1 year follow-up  History of Present Illness:    [name] is a 72 y.o. female with family history of heart disease; mother died of a sudden heart attack at age 15, her both grandparents had heart disease and a grandmother had a stroke at age 108.  She has a history of dyslipidemia, hypertension, obesity, TBI with headaches and memory loss, type 2 diabetes and colon cancer s/p resection.  Festus Holts saw her November 2022.  She mentions chest discomfort with stress.  However when she walks 1 mile per day she had no chest pain or shortness of breath.  Hypertension, well controlled -***  -Refill valsartan and amlodipine.  Change Coreg 6.25 twice daily to Coreg ER 20 mg daily for easier dosing. -Goal BP is <130/80.  Recommend DASH diet (high in vegetables, fruits, low-fat dairy products, whole grains, poultry, fish, and nuts and low in sweets, sugar-sweetened beverages, and red meats), salt restriction and increase physical activity.   Hyperlipidemia -***  -Check fasting lipids today.  Refill Lipitor 40 mg daily. -Discussed cholesterol lowering diets - Mediterranean diet, DASH diet, vegetarian diet, low-carbohydrate diet and avoidance of trans fats.  Discussed healthier choice substitutes.  Nuts, high-fiber foods, and fiber supplements may also improve lipids.     Disposition - Follow-up in ***       Past Medical History:  Diagnosis Date   Allergy    Anxiety    Arthritis    Back pain    Blood transfusion without reported diagnosis    Cancer of sigmoid colon ([place])    Constipation    resolved with Miralax   Depression    Dyspnea    pt denies at preop on 11/02/22   Fatigue    Fatty liver    mild   GERD (gastroesophageal reflux disease)    Grade I diastolic  dysfunction    Echo 08/08/2017 with Grade I DD, EF 51%, Mild Aortic regurgitation, Mild Tricuspid Regurgitation, Pulmonary pressure 30 mm Hg. She denies any chest pain, dyspnea, or peripheral edema.   Headaches due to old head injury    History of kidney stones    History of stomach ulcers    Hyperlipidemia    Hypertension    Lactose intolerance    Left hip pain    Left thyroid nodule    Lipoma of extremity 10/17/2017   Left lower   Migraines    Obesity    Panniculitis    Pneumonia    Pre-diabetes    TBI (traumatic brain injury) ([place])    fell in 2008, some issues with short-term memory   Thyroid disease    Vitamin D deficiency     Past Surgical History:  Procedure Laterality Date   ABDOMINAL HYSTERECTOMY     ABDOMINOPLASTY/PANNICULECTOMY WITH LIPOSUCTION N/A 09/02/2020   Procedure: PANNICULECTOMY WITH LIPOSUCTION;  Surgeon: Wallace Going, DO;  Location: [place];  Service: Plastics;  Laterality: N/A;  3 hours, please   BREAST LUMPECTOMY WITH RADIOACTIVE SEED LOCALIZATION Left 08/23/2016   Procedure: LEFT BREAST LUMPECTOMY WITH RADIOACTIVE SEED LOCALIZATION;  Surgeon: Rolm Bookbinder, MD;  Location: [place];  Service: General;  Laterality: Left;   BREAST SURGERY  2003   reduction   CESAREAN SECTION  x2   CHALAZION EXCISION Left 2010   COLONOSCOPY W/ POLYPECTOMY  02/16/2018   Dr. Delaney Meigs JOINT INJECTION  2011 and 2012   FLEXIBLE SIGMOIDOSCOPY N/A 04/18/2018   Procedure: FLEXIBLE SIGMOIDOSCOPY;  Surgeon: Ileana Roup, MD;  [facility];  Service: General;  Laterality: N/A;   LAPAROSCOPIC SIGMOID COLECTOMY N/A 04/18/2018   Procedure: LAPAROSCOPIC ASSISTED SIGMOIDECTOMY;  Surgeon: Ileana Roup, MD;  [facility];  Service: General;  Laterality: N/A;   MYOMECTOMY     MYOMECTOMY     x2   NM [place]  03/2009   dipyridamole; normal pattern of perfusion in all regions, post-stress EF 65%,  normal study    ROBOTIC ASSISTED LAPAROSCOPIC LYSIS OF ADHESION N/A 11/03/2022   Procedure: XI ROBOTIC ASSISTED LAPAROSCOPIC LYSIS OF ADHESION;  Surgeon: [name], MD;  [facility];  Service: Gynecology;  Laterality: N/A;   ROBOTIC ASSISTED SALPINGO OOPHERECTOMY Left 11/03/2022   Procedure: XI ROBOTIC ASSISTED UNILATERAL SALPINGO OOPHORECTOMY, CYSTOSCOPY;  Surgeon: [name], MD;  [facility];  Service: Gynecology;  Laterality: Left;   THYROIDECTOMY  1989   partial   TRANSTHORACIC ECHOCARDIOGRAM  03/2009   EF=>55%, borderline conc LVH; mild mitral annular calcif, trace MR; trace TR; AV mildly sclerotic, mild AV regurg; mild pulm valve regurg    TUBAL LIGATION      Current Medications: No outpatient medications have been marked as taking for the 12/07/22 encounter (Appointment) with Warren Lacy, PA-C.     Allergies:   Hydrochlorothiazide, Botox [botulinum toxin type a], Onabotulinumtoxina, Toprol xl [metoprolol succinate], and Roselyn Meier [ubrogepant]   Social History   Socioeconomic History   Marital status: Married    Spouse name: Carloyn Manner   Number of children: 2   Years of education: Not on file   Highest education level: Not on file  Occupational History   Occupation: retired    Fish farm manager: [employer]  Tobacco Use   Smoking status: Never   Smokeless tobacco: Never  Vaping Use   Vaping Use: Never used  Substance and Sexual Activity   Alcohol use: Yes    Comment: occ   Drug use: No   Sexual activity: Yes    Birth control/protection: Surgical    Comment: Hyst/LSO  Other Topics Concern   Not on file  Social History Narrative   Not on file   Social Determinants of Health   Financial Resource Strain: Not on file  Food Insecurity: Not on file  Transportation Needs: Not on file  Physical Activity: Not on file  Stress: Not on file  Social Connections: Not on file     Family History: The patient's family history includes ADD /  ADHD in her son; Anxiety disorder in her mother; Breast cancer in her cousin and paternal great-grandmother; Depression in her daughter; Diabetes in her father; Glaucoma in her father; Heart attack in her paternal uncle; Heart disease in her maternal grandfather, maternal grandmother, mother, paternal grandfather, and paternal grandmother; Hyperlipidemia in her mother; Hypertension in her mother and paternal uncle; Prostate cancer in her brother; Stroke in her maternal grandfather and paternal grandmother; Sudden death in her mother; Thyroid disease in her mother and sister. There is no history of Colon cancer, Stomach cancer, Esophageal cancer, Rectal cancer, Colon polyps, Ovarian cancer, Endometrial cancer, or Pancreatic cancer.  ROS:   Please see the history of present illness.     EKGs/Labs/Other Studies Reviewed:    EKG:  The  ekg ordered today demonstrates ***  Recent Labs: 05/23/2022: TSH 0.74 11/02/2022: ALT 21; BUN 19; Creatinine, Ser 0.86; Hemoglobin 13.2; Platelets 259; Potassium 4.1; Sodium 141   Recent Lipid Panel Lab Results  Component Value Date/Time   CHOL 137 05/23/2022 02:47 PM   CHOL 190 10/18/2021 10:43 AM   CHOL 206 (H) 12/19/2013 08:20 AM   TRIG 77.0 05/23/2022 02:47 PM   TRIG 111 12/19/2013 08:20 AM   HDL 50.80 05/23/2022 02:47 PM   HDL 51 10/18/2021 10:43 AM   HDL 70 12/19/2013 08:20 AM   LDLCALC 71 05/23/2022 02:47 PM   LDLCALC 122 (H) 10/18/2021 10:43 AM   LDLCALC 114 (H) 12/19/2013 08:20 AM    Physical Exam:    VS:  There were no vitals taken for this visit.   No data found.  No BP recorded.  {Refresh Note OR Click here to enter BP  :1}***    Wt Readings from Last 3 Encounters:  11/03/22 204 lb (92.5 kg)  11/02/22 204 lb (92.5 kg)  10/11/22 206 lb 3.2 oz (93.5 kg)     GEN: *** Well nourished, well developed in no acute distress HEENT: Normal NECK: No JVD; No carotid bruits CARDIAC: ***RRR, no murmurs, rubs, gallops RESPIRATORY:  Clear to  auscultation without rales, wheezing or rhonchi  ABDOMEN: Soft, non-tender, non-distended MUSCULOSKELETAL: No edema SKIN: Warm and dry NEUROLOGIC:  Alert and oriented PSYCHIATRIC:  Normal affect     ASSESSMENT AND PLAN   ***   {Are you ordering a CV Procedure (e.g. stress test, cath, DCCV, TEE, etc)?   Press F2        :[id number]}    Medication Adjustments/Labs and Tests Ordered: Current medicines are reviewed at length with the patient today.  Concerns regarding medicines are outlined above.  No orders of the defined types were placed in this encounter.  No orders of the defined types were placed in this encounter.   There are no Patient Instructions on file for this visit.   Signed, Warren Lacy, PA-C  12/05/2022 3:46 PM    [facility]

## 2022-12-07 ENCOUNTER — Ambulatory Visit: Payer: BC Managed Care – PPO | Attending: Student | Admitting: Physician Assistant

## 2022-12-07 ENCOUNTER — Encounter: Payer: Self-pay | Admitting: Physician Assistant

## 2022-12-07 VITALS — BP 162/82 | HR 53 | Ht 63.0 in | Wt 206.8 lb

## 2022-12-07 DIAGNOSIS — I10 Essential (primary) hypertension: Secondary | ICD-10-CM | POA: Diagnosis not present

## 2022-12-07 DIAGNOSIS — E785 Hyperlipidemia, unspecified: Secondary | ICD-10-CM | POA: Diagnosis not present

## 2022-12-07 DIAGNOSIS — E669 Obesity, unspecified: Secondary | ICD-10-CM | POA: Diagnosis not present

## 2022-12-07 MED ORDER — CARVEDILOL PHOSPHATE ER 40 MG PO CP24: 40.0000 mg | ORAL_CAPSULE | Freq: Every day | ORAL | 3 refills | Status: DC

## 2022-12-07 NOTE — Patient Instructions (Signed)
Medication Instructions:  Increase Coreg CR from 20 mg to 40 mg ( Take 1 Tablet  At Night). Valsartan 320 mg ( Take 1/2 Tablet 160 mg Daily). *If you need a refill on your cardiac medications before your next appointment, please call your pharmacy*   Lab Work: No Labs  If you have labs (blood work) drawn today and your tests are completely normal, you will receive your results only by: [place] (if you have MyChart) OR A paper copy in the mail If you have any lab test that is abnormal or we need to change your treatment, we will call you to review the results.   Testing/Procedures: No Testing   Follow-Up: At [facility], you and your health needs are our priority.  As part of our continuing mission to provide you with exceptional heart care, we have created designated Provider Care Teams.  These Care Teams include your primary Cardiologist (physician) and Advanced Practice Providers (APPs -  Physician Assistants and Nurse Practitioners) who all work together to provide you with the care you need, when you need it.  We recommend signing up for the patient portal called "MyChart".  Sign up information is provided on this After Visit Summary.  MyChart is used to connect with patients for Virtual Visits (Telemedicine).  Patients are able to view lab/test results, encounter notes, upcoming appointments, etc.  Non-urgent messages can be sent to your provider as well.   To learn more about what you can do with MyChart, go to [email or web address].    Your next appointment:   6 month(s)  The format for your next appointment:   In Person  Provider:   Caron Presume, PA-C    or, Pixie Casino, MD   Other Instructions Bring Blood Pressure Log and Home Blood Pressure Cuff to Pharmacy Appointment.

## 2022-12-08 ENCOUNTER — Other Ambulatory Visit: Payer: Self-pay

## 2022-12-08 ENCOUNTER — Inpatient Hospital Stay: Payer: BC Managed Care – PPO

## 2022-12-08 ENCOUNTER — Encounter: Payer: Self-pay | Admitting: Gynecologic Oncology

## 2022-12-08 ENCOUNTER — Inpatient Hospital Stay: Payer: BC Managed Care – PPO | Attending: Gynecologic Oncology | Admitting: Gynecologic Oncology

## 2022-12-08 VITALS — BP 137/67 | HR 60 | Temp 98.7°F | Resp 17 | Wt 209.0 lb

## 2022-12-08 DIAGNOSIS — Z79899 Other long term (current) drug therapy: Secondary | ICD-10-CM | POA: Insufficient documentation

## 2022-12-08 DIAGNOSIS — E281 Androgen excess: Secondary | ICD-10-CM | POA: Insufficient documentation

## 2022-12-08 DIAGNOSIS — Z90710 Acquired absence of both cervix and uterus: Secondary | ICD-10-CM | POA: Insufficient documentation

## 2022-12-08 DIAGNOSIS — Z90721 Acquired absence of ovaries, unilateral: Secondary | ICD-10-CM

## 2022-12-08 DIAGNOSIS — N9489 Other specified conditions associated with female genital organs and menstrual cycle: Secondary | ICD-10-CM

## 2022-12-08 NOTE — Patient Instructions (Signed)
It was great to see you today. You are healing well from surgery.  Please remember, no heavy lifting for at least 6 weeks after surgery.  If there are any issues that come up as your recovery continues, please do not hesitate to call my office at [phone].  I will let you know when I get your lab test from today.

## 2022-12-08 NOTE — Progress Notes (Signed)
Gynecologic Oncology Return Clinic Visit  12/08/22  Reason for Visit: Follow-up after surgery  Treatment History: She initially presented with 6 months of increasing body hair, clitorimegaly.  Today, the patient tells me that before 6-12 months ago, she had almost no hair on her abdomen.  She has been using electrolysis for hair removal on her face, is unsure whether there is been any difference in facial hair.  She also notes noticing that her voice is deeper.   06/09/2022: Pelvic ultrasound exam shows prior hysterectomy, nonvisualization of either ovary.  No adnexal masses or free fluid. 07/09/2022: Pelvic MRI reveals prior hysterectomy.  Right ovary not visualized but no adnexal mass identified.  Left ovary appears normal, no ovarian or adnexal mass identified. 09/27/2022: CT of the abdomen and pelvis reveals left ovary without change in size from prior MRI.  Right ovary not visible.  No adrenal lesion.  Liver with question of cirrhotic morphology, no signs of portal hypertension.  Nephrolithiasis within the upper left renal pole.  Postsurgical changes of the rectosigmoid colon and anastomosis.   Labs: Testosterone: 223 on 6/26, 174 on 7/18 Testosterone, free: 1.7 SHBG: 56.8 Androstenedione: 129 Estrogen: 161 Testosterone: 224 DHEA-SO4: 21 17OHP: 94 Cortisol: 152 on 7/18, 1.2 on 7/26  11/03/22: Robotic-assisted laparoscopic left salpingo-oophorectomy, enterolysis for approximately 50 minutes    Interval History: Patient reports doing well.  Has some minimal soreness with more activity in her left lateral incision.  Otherwise denies any abdominal pain.  Reports baseline bowel bladder function.  Tolerating p.o. without nausea or emesis.  Past Medical/Surgical History: Past Medical History:  Diagnosis Date   Allergy    Anxiety    Arthritis    Back pain    Blood transfusion without reported diagnosis    Cancer of sigmoid colon ([place])    Constipation    resolved with Miralax    Depression    Dyspnea    pt denies at preop on 11/02/22   Fatigue    Fatty liver    mild   GERD (gastroesophageal reflux disease)    Grade I diastolic dysfunction    Echo 08/08/2017 with Grade I DD, EF 51%, Mild Aortic regurgitation, Mild Tricuspid Regurgitation, Pulmonary pressure 30 mm Hg. She denies any chest pain, dyspnea, or peripheral edema.   Headaches due to old head injury    History of kidney stones    History of stomach ulcers    Hyperlipidemia    Hypertension    Lactose intolerance    Left hip pain    Left thyroid nodule    Lipoma of extremity 10/17/2017   Left lower   Migraines    Obesity    Panniculitis    Pneumonia    Pre-diabetes    TBI (traumatic brain injury) ([place])    fell in 2008, some issues with short-term memory   Thyroid disease    Vitamin D deficiency     Past Surgical History:  Procedure Laterality Date   ABDOMINAL HYSTERECTOMY     ABDOMINOPLASTY/PANNICULECTOMY WITH LIPOSUCTION N/A 09/02/2020   Procedure: PANNICULECTOMY WITH LIPOSUCTION;  Surgeon: Wallace Going, DO;  Location: [place];  Service: Plastics;  Laterality: N/A;  3 hours, please   BREAST LUMPECTOMY WITH RADIOACTIVE SEED LOCALIZATION Left 08/23/2016   Procedure: LEFT BREAST LUMPECTOMY WITH RADIOACTIVE SEED LOCALIZATION;  Surgeon: Rolm Bookbinder, MD;  Location: [place];  Service: General;  Laterality: Left;   BREAST SURGERY  2003   reduction   CESAREAN SECTION  x2   CHALAZION EXCISION Left 2010   COLONOSCOPY W/ POLYPECTOMY  02/16/2018   Dr. Delaney Meigs JOINT INJECTION  2011 and 2012   FLEXIBLE SIGMOIDOSCOPY N/A 04/18/2018   Procedure: FLEXIBLE SIGMOIDOSCOPY;  Surgeon: Ileana Roup, MD;  [facility];  Service: General;  Laterality: N/A;   LAPAROSCOPIC SIGMOID COLECTOMY N/A 04/18/2018   Procedure: LAPAROSCOPIC ASSISTED SIGMOIDECTOMY;  Surgeon: Ileana Roup, MD;  [facility];  Service: General;  Laterality: N/A;    MYOMECTOMY     MYOMECTOMY     x2   NM [place]  03/2009   dipyridamole; normal pattern of perfusion in all regions, post-stress EF 65%, normal study    ROBOTIC ASSISTED LAPAROSCOPIC LYSIS OF ADHESION N/A 11/03/2022   Procedure: XI ROBOTIC ASSISTED LAPAROSCOPIC LYSIS OF ADHESION;  Surgeon: [name], MD;  [facility];  Service: Gynecology;  Laterality: N/A;   ROBOTIC ASSISTED SALPINGO OOPHERECTOMY Left 11/03/2022   Procedure: XI ROBOTIC ASSISTED UNILATERAL SALPINGO OOPHORECTOMY, CYSTOSCOPY;  Surgeon: [name], MD;  [facility];  Service: Gynecology;  Laterality: Left;   THYROIDECTOMY  1989   partial   TRANSTHORACIC ECHOCARDIOGRAM  03/2009   EF=>55%, borderline conc LVH; mild mitral annular calcif, trace MR; trace TR; AV mildly sclerotic, mild AV regurg; mild pulm valve regurg    TUBAL LIGATION      Family History  Problem Relation Age of Onset   Heart disease Mother        MI, HTN   Hypertension Mother    Thyroid disease Mother    Hyperlipidemia Mother    Sudden death Mother    Anxiety disorder Mother    Glaucoma Father    Diabetes Father    Thyroid disease Sister    Prostate cancer Brother    Heart disease Maternal Grandmother        MI, CVA   Heart disease Maternal Grandfather        MI   Stroke Maternal Grandfather    Heart disease Paternal Grandmother    Stroke Paternal Grandmother    Heart disease Paternal Grandfather        MI   Depression Daughter    ADD / ADHD Son    Heart attack Paternal Uncle        HTN   Hypertension Paternal Uncle    Breast cancer Cousin    Breast cancer Paternal Great-grandmother    Colon cancer Neg Hx    Stomach cancer Neg Hx    Esophageal cancer Neg Hx    Rectal cancer Neg Hx    Colon polyps Neg Hx    Ovarian cancer Neg Hx    Endometrial cancer Neg Hx    Pancreatic cancer Neg Hx     Social History   Socioeconomic History   Marital status: Married    Spouse name: Carloyn Manner   Number of  children: 2   Years of education: Not on file   Highest education level: Not on file  Occupational History   Occupation: retired    Fish farm manager: [employer]  Tobacco Use   Smoking status: Never   Smokeless tobacco: Never  Vaping Use   Vaping Use: Never used  Substance and Sexual Activity   Alcohol use: Yes    Comment: occ   Drug use: No   Sexual activity: Yes    Birth control/protection: Surgical    Comment: Hyst/LSO  Other Topics Concern   Not on file  Social History Narrative   Not on file   Social Determinants of Health   Financial Resource Strain: Not on file  Food Insecurity: Not on file  Transportation Needs: Not on file  Physical Activity: Not on file  Stress: Not on file  Social Connections: Not on file    Current Medications:  Current Outpatient Medications:    albuterol (VENTOLIN HFA) 108 (90 Base) MCG/ACT inhaler, INHALE 2 PUFFS INTO THE LUNGS EVERY 6 HOURS AS NEEDED FOR WHEEZING OR SHORTNESS OF BREATH, Disp: 54 g, Rfl: 1   amLODipine (NORVASC) 10 MG tablet, Take 1 tablet (10 mg total) by mouth daily., Disp: 90 tablet, Rfl: 2   atorvastatin (LIPITOR) 80 MG tablet, Take 1 tablet (80 mg total) by mouth daily., Disp: 90 tablet, Rfl: 3   carvedilol (COREG CR) 40 MG 24 hr capsule, Take 1 capsule (40 mg total) by mouth daily., Disp: 90 capsule, Rfl: 3   cetirizine (ZYRTEC) 10 MG tablet, Take 10 mg by mouth daily as needed for allergies., Disp: , Rfl:    Cholecalciferol (VITAMIN D3) 1.25 MG (50000 UT) CAPS, Take 50,000 Units by mouth every Saturday., Disp: , Rfl:    desvenlafaxine (PRISTIQ) 100 MG 24 hr tablet, Take 100 mg by mouth daily., Disp: , Rfl:    diclofenac Sodium (VOLTAREN) 1 % GEL, Apply 1 Application topically 4 (four) times daily as needed (pain)., Disp: , Rfl:    Fluocinolone Acetonide Body (DERMA-SMOOTHE/FS BODY) 0.01 % OIL, Apply to scalp and leave on 8 hours or overnight as needed for eczema, Disp: 118 mL, Rfl: 2   fluticasone (FLONASE) 50  MCG/ACT nasal spray, Place 2 sprays into both nostrils daily. (Patient taking differently: Place 2 sprays into both nostrils daily as needed for allergies.), Disp: 16 g, Rfl: 0   frovatriptan (FROVA) 2.5 MG tablet, Take 2.5 mg by mouth as needed for migraine. If recurs, may repeat after 2 hours. Max of 3 tabs in 24 hours., Disp: , Rfl:    gabapentin (NEURONTIN) 300 MG capsule, Take 600-900 mg by mouth 3 (three) times daily as needed (headaches)., Disp: , Rfl:    naproxen sodium (ALEVE) 220 MG tablet, Take 220 mg by mouth daily as needed (knee swelling)., Disp: , Rfl:    OLANZapine (ZYPREXA) 5 MG tablet, Take 5 mg by mouth at bedtime as needed (sleep)., Disp: , Rfl:    omeprazole (PRILOSEC) 40 MG capsule, Take 40 mg by mouth daily., Disp: , Rfl:    polyethylene glycol (MIRALAX / GLYCOLAX) packet, Take 17 g by mouth daily., Disp: , Rfl:    spironolactone (ALDACTONE) 25 MG tablet, Take 1 tablet (25 mg total) by mouth daily., Disp: 90 tablet, Rfl: 3   Tetrahydrozoline HCl (MURINE FOR RED EYES OP), Place 1 drop into both eyes daily as needed (redness)., Disp: , Rfl:    valACYclovir (VALTREX) 1000 MG tablet, Take 1 tablet (1,000 mg total) by mouth daily. For suppression (Patient taking differently: Take 1,000 mg by mouth daily as needed (fever blisters). For suppression), Disp: 90 tablet, Rfl: 2   valsartan (DIOVAN) 320 MG tablet, Take 160 mg by mouth daily. Take 1/2 Tablet Daily, Disp: , Rfl:   Review of Systems: Denies appetite changes, fevers, chills, fatigue, unexplained weight changes. Denies hearing loss, neck lumps or masses, mouth sores, ringing in ears or voice changes. Denies cough or wheezing.  Denies shortness of breath. Denies chest pain or palpitations. Denies leg swelling. Denies abdominal distention, pain, blood in stools, constipation, diarrhea, nausea,  vomiting, or early satiety. Denies pain with intercourse, dysuria, frequency, hematuria or incontinence. Denies hot flashes, pelvic  pain, vaginal bleeding or vaginal discharge.   Denies joint pain, back pain or muscle pain/cramps. Denies itching, rash, or wounds. Denies dizziness, headaches, numbness or seizures. Denies swollen lymph nodes or glands, denies easy bruising or bleeding. Denies anxiety, depression, confusion, or decreased concentration.  Physical Exam: BP 137/67 (BP Location: Left Arm, Patient Position: Sitting)   Pulse 60   Temp 98.7 F (37.1 C) (Oral)   Resp 17   Wt 209 lb (94.8 kg)   SpO2 97%   BMI 37.02 kg/m  General: Alert, oriented, no acute distress. HEENT: Normocephalic, atraumatic, sclera anicteric. Chest: Unlabored breathing on room air. Abdomen: Obese, soft, nontender.  Normoactive bowel sounds.  No masses or hepatosplenomegaly appreciated.  Well-healed incisions. Extremities: Grossly normal range of motion.  Warm, well perfused.  No edema bilaterally.  Laboratory & Radiologic Studies: A. OVARY AND FALLOPIAN TUBE, LEFT, SALPINGO OOPHORECTOMY:  - Leydig cell tumor, 1.2 cm (see Comment)  - Benign fallopian tube   COMMENT:   Histologic sections reveal a nodular growth of rounded epithelioid cells  with abundant eosinophilic, and occasionally pale, cytoplasm. Clustering  of nuclei, creating intervening eosinophilic nuclear-free zones, is also  present. Cytoplasmic Reinke crystals (rod-shaped elongated eosinophilic  crystals) are identified. This case was reviewed with Dr. Saralyn Pilar who  agrees with the above diagnosis.   Specimen Submitted:  A. PELVIC, WASHING:  FINAL MICROSCOPIC DIAGNOSIS:  - No malignant cells identified   Assessment & Plan: VIRJEAN BOMAN is a 72 y.o. 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   The patient is doing well.  Meeting postoperative milestones.  Discussed continued restrictions and precautions.  Will plan to check hormone levels again today.  The patient has not noticed a  difference in hair growth or other symptomatology, but we discussed that it may take weeks to months for her to notice some change.  Will communicate with her lab results from today and we will make sure that these are also sent to her endocrinologist.  18 minutes of total time was spent for this patient encounter, including preparation, face-to-face counseling with the patient and coordination of care, and documentation of the encounter.  Jeral Pinch, MD  Division of Gynecologic Oncology  Department of Obstetrics and Gynecology  [facility]

## 2022-12-09 ENCOUNTER — Inpatient Hospital Stay: Payer: BC Managed Care – PPO | Admitting: Gynecologic Oncology

## 2022-12-09 ENCOUNTER — Encounter: Payer: BC Managed Care – PPO | Admitting: Gynecologic Oncology

## 2022-12-09 DIAGNOSIS — E281 Androgen excess: Secondary | ICD-10-CM | POA: Diagnosis present

## 2022-12-09 DIAGNOSIS — N9489 Other specified conditions associated with female genital organs and menstrual cycle: Secondary | ICD-10-CM

## 2022-12-09 DIAGNOSIS — Z79899 Other long term (current) drug therapy: Secondary | ICD-10-CM | POA: Diagnosis not present

## 2022-12-09 DIAGNOSIS — Z90710 Acquired absence of both cervix and uterus: Secondary | ICD-10-CM | POA: Diagnosis not present

## 2022-12-09 DIAGNOSIS — Z90721 Acquired absence of ovaries, unilateral: Secondary | ICD-10-CM | POA: Diagnosis not present

## 2022-12-18 LAB — TESTOSTERONE, FREE, TOTAL, SHBG
Sex Hormone Binding: 45.1 nmol/L (ref 17.3–125.0)
Testosterone, Free: 0.5 pg/mL (ref 0.0–4.2)
Testosterone: 4 ng/dL (ref 3–67)

## 2022-12-27 ENCOUNTER — Ambulatory Visit: Payer: BC Managed Care – PPO

## 2023-01-24 ENCOUNTER — Encounter: Payer: Self-pay | Admitting: Endocrinology

## 2023-03-01 LAB — HM MAMMOGRAPHY

## 2023-03-08 ENCOUNTER — Encounter: Payer: Self-pay | Admitting: Family Medicine

## 2023-03-12 NOTE — Patient Instructions (Incomplete)
It was great to see you again today I will be in touch with your urine culture ASAP.  However, your urine dipstick test does not appear to show any sign of infection  You are likely having some urge incontinence and overactive bladder.  Lets have you try the Detrol LA 2 mg once daily.  Also, I would suggest reducing fluid intake the last couple hours before bed.  Please let me know how this seems to be working for you.  We do of other medications we can try if this is not helpful!  Your blood pressure is a bit too high.  Lets have you increase your dose of valsartan to a whole tablet, 320 mg- please let me know how your BP responds to this change.  If you would, please try to check your BP daily for the next week or so and send me a note on mychart

## 2023-03-12 NOTE — Progress Notes (Addendum)
[facility] [address] [place], [place] [address] [phone]  Date:  03/15/2023   Name:  [name]   DOB:  1950/12/23   MRN:  [id number]  PCP:  Pearline Cables, MD    Chief Complaint: Urinary Frequency (Pt stated this started over a year ago but has gradually worsened. Worse at night. )   History of Present Illness:  [name] is a 72 y.o. very pleasant female patient who presents with the following:  Patient seen today with concern of frequent urination History of hypertension, migraine headache, colon cancer status post sigmoid colectomy 2019, obesity, dyslipidemia, prediabetes, right lobe thyroidectomy 1992 for hyperthyroidism.  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  She was seen by gynecologic oncology who recommended removal of her 1 remaining ovary due to possibility of occult ovarian cancer  She had surgery in December of last year per Dr. Eugene Garnet, gynecologic oncology-ovarian pathology was benign  Patient contacted me a few days ago with the following: My ovary removal put me back into normal hormone ranges.  Over active bladder at night. Some urgency during days as well. I'm getting up 3-5 times every night now. Would you like me to come in for a check?  Amlodipine 10 Atorvastatin 80 Carvedilol Diovan 160 mg daily Pristiq Spironolactone 25 Gabapentin Zyprexa as needed for sleep  She has noted gradually worsening urinary sx for a few years  She notes urinary frequency during the day especially. 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   Hirsutism 10/11/2022   S/P panniculectomy 09/21/2020   Neck pain 12/20/2019   Colon cancer 04/18/2018   Class 2 obesity with serious comorbidity and body mass index (BMI) of 38.0 to 38.9 in adult 07/27/2017   Vitamin D deficiency 07/27/2017   Other fatigue 06/29/2017   Shortness of breath on exertion 06/29/2017   Class 2 obesity with serious comorbidity and body mass index (BMI) of 39.0 to 39.9 in adult 06/29/2017   Left hip pain 04/18/2017   Pre-diabetes 02/05/2016   Left thyroid nodule 11/10/2014   TBI (traumatic brain injury) 07/15/2014   Chest pain 12/13/2013   Dyslipidemia 12/13/2013   Obesity (BMI 35.0-39.9 without comorbidity) 12/13/2013   Hypertension 01/18/2012   GERD (gastroesophageal reflux disease) 01/18/2012   Depression 01/18/2012   Migraine syndrome 01/18/2012    Past Medical History:  Diagnosis Date   Allergy    Anxiety    Arthritis    Back pain    Blood transfusion without reported diagnosis    Cancer of sigmoid colon    Constipation    resolved with Miralax   Depression    Dyspnea    pt denies at preop on 11/02/22   Fatigue    Fatty liver    mild  GERD (gastroesophageal reflux disease)    Grade I diastolic dysfunction    Echo 08/08/2017 with Grade I DD, EF 51%, Mild Aortic regurgitation, Mild Tricuspid Regurgitation, Pulmonary pressure 30 mm Hg. She denies any chest pain, dyspnea, or peripheral edema.   Headaches due to old head injury    History of kidney stones    History of stomach ulcers    Hyperlipidemia    Hypertension    Lactose intolerance    Left hip pain    Left thyroid nodule    Lipoma of extremity 10/17/2017   Left lower   Migraines    Obesity     Panniculitis    Pneumonia    Pre-diabetes    TBI (traumatic brain injury)    fell in 2008, some issues with short-term memory   Thyroid disease    Vitamin D deficiency     Past Surgical History:  Procedure Laterality Date   ABDOMINAL HYSTERECTOMY     ABDOMINOPLASTY/PANNICULECTOMY WITH LIPOSUCTION N/A 09/02/2020   Procedure: PANNICULECTOMY WITH LIPOSUCTION;  Surgeon: Peggye Form, DO;  Location: [facility];  Service: Plastics;  Laterality: N/A;  3 hours, please   BREAST LUMPECTOMY WITH RADIOACTIVE SEED LOCALIZATION Left 08/23/2016   Procedure: LEFT BREAST LUMPECTOMY WITH RADIOACTIVE SEED LOCALIZATION;  Surgeon: Emelia Loron, MD;  Location: [facility];  Service: General;  Laterality: Left;   BREAST SURGERY  2003   reduction   CESAREAN SECTION     x2   CHALAZION EXCISION Left 2010   COLONOSCOPY W/ POLYPECTOMY  02/16/2018   Dr. Alvino Chapel JOINT INJECTION  2011 and 2012   FLEXIBLE SIGMOIDOSCOPY N/A 04/18/2018   Procedure: FLEXIBLE SIGMOIDOSCOPY;  Surgeon: Andria Meuse, MD;  Location: [facility];  Service: General;  Laterality: N/A;   LAPAROSCOPIC SIGMOID COLECTOMY N/A 04/18/2018   Procedure: LAPAROSCOPIC ASSISTED SIGMOIDECTOMY;  Surgeon: Andria Meuse, MD;  Location: [facility];  Service: General;  Laterality: N/A;   MYOMECTOMY     MYOMECTOMY     x2   NM MYOCAR PERF WALL MOTION  03/2009   dipyridamole; normal pattern of perfusion in all regions, post-stress EF 65%, normal study    ROBOTIC ASSISTED LAPAROSCOPIC LYSIS OF ADHESION N/A 11/03/2022   Procedure: XI ROBOTIC ASSISTED LAPAROSCOPIC LYSIS OF ADHESION;  Surgeon: Carver Fila, MD;  Location: [facility];  Service: Gynecology;  Laterality: N/A;   ROBOTIC ASSISTED SALPINGO OOPHERECTOMY Left 11/03/2022   Procedure: XI ROBOTIC ASSISTED UNILATERAL SALPINGO OOPHORECTOMY, CYSTOSCOPY;  Surgeon: Carver Fila, MD;  Location: [facility];  Service: Gynecology;  Laterality: Left;    THYROIDECTOMY  1989   partial   TRANSTHORACIC ECHOCARDIOGRAM  03/2009   EF=>55%, borderline conc LVH; mild mitral annular calcif, trace MR; trace TR; AV mildly sclerotic, mild AV regurg; mild pulm valve regurg    TUBAL LIGATION      Social History   Tobacco Use   Smoking status: Never   Smokeless tobacco: Never  Vaping Use   Vaping Use: Never used  Substance Use Topics   Alcohol use: Yes    Comment: occ   Drug use: No    Family History  Problem Relation Age of Onset   Heart disease Mother        MI, HTN   Hypertension Mother    Thyroid disease Mother    Hyperlipidemia Mother    Sudden death Mother    Anxiety disorder Mother    Glaucoma Father    Diabetes Father  Thyroid disease Sister    Prostate cancer Brother    Heart disease Maternal Grandmother        MI, CVA   Heart disease Maternal Grandfather        MI   Stroke Maternal Grandfather    Heart disease Paternal Grandmother    Stroke Paternal Grandmother    Heart disease Paternal Grandfather        MI   Depression Daughter    ADD / ADHD Son    Heart attack Paternal Uncle        HTN   Hypertension Paternal Uncle    Breast cancer Cousin    Breast cancer Paternal Great-grandmother    Colon cancer Neg Hx    Stomach cancer Neg Hx    Esophageal cancer Neg Hx    Rectal cancer Neg Hx    Colon polyps Neg Hx    Ovarian cancer Neg Hx    Endometrial cancer Neg Hx    Pancreatic cancer Neg Hx     Allergies  Allergen Reactions   Hydrochlorothiazide Other (See Comments)    Increases risk of gout flares    Botox [Botulinum Toxin Type A] Swelling   Onabotulinumtoxina Swelling   Toprol Xl [Metoprolol Succinate] Hives   Ubrelvy [Ubrogepant] Hives    Medication list has been reviewed and updated.  Current Outpatient Medications on File Prior to Visit  Medication Sig Dispense Refill   albuterol (VENTOLIN HFA) 108 (90 Base) MCG/ACT inhaler INHALE 2 PUFFS INTO THE LUNGS EVERY 6 HOURS AS NEEDED FOR WHEEZING OR  SHORTNESS OF BREATH 54 g 1   amLODipine (NORVASC) 10 MG tablet Take 1 tablet (10 mg total) by mouth daily. 90 tablet 2   carvedilol (COREG CR) 40 MG 24 hr capsule Take 1 capsule (40 mg total) by mouth daily. 90 capsule 3   cetirizine (ZYRTEC) 10 MG tablet Take 10 mg by mouth daily as needed for allergies.     Cholecalciferol (VITAMIN D3) 1.25 MG (50000 UT) CAPS Take 50,000 Units by mouth every Saturday.     desvenlafaxine (PRISTIQ) 100 MG 24 hr tablet Take 100 mg by mouth daily.     diclofenac Sodium (VOLTAREN) 1 % GEL Apply 1 Application topically 4 (four) times daily as needed (pain).     Fluocinolone Acetonide Body (DERMA-SMOOTHE/FS BODY) 0.01 % OIL Apply to scalp and leave on 8 hours or overnight as needed for eczema 118 mL 2   fluticasone (FLONASE) 50 MCG/ACT nasal spray Place 2 sprays into both nostrils daily. (Patient taking differently: Place 2 sprays into both nostrils daily as needed for allergies.) 16 g 0   frovatriptan (FROVA) 2.5 MG tablet Take 2.5 mg by mouth as needed for migraine. If recurs, may repeat after 2 hours. Max of 3 tabs in 24 hours.     gabapentin (NEURONTIN) 300 MG capsule Take 600-900 mg by mouth 3 (three) times daily as needed (headaches).     naproxen sodium (ALEVE) 220 MG tablet Take 220 mg by mouth daily as needed (knee swelling).     OLANZapine (ZYPREXA) 5 MG tablet Take 5 mg by mouth at bedtime as needed (sleep).     omeprazole (PRILOSEC) 40 MG capsule Take 40 mg by mouth daily.     polyethylene glycol (MIRALAX / GLYCOLAX) packet Take 17 g by mouth daily.     spironolactone (ALDACTONE) 25 MG tablet Take 1 tablet (25 mg total) by mouth daily. 90 tablet 3   Tetrahydrozoline HCl (MURINE FOR RED EYES  OP) Place 1 drop into both eyes daily as needed (redness).     valACYclovir (VALTREX) 1000 MG tablet Take 1 tablet (1,000 mg total) by mouth daily. For suppression (Patient taking differently: Take 1,000 mg by mouth daily as needed (fever blisters). For suppression) 90  tablet 2   valsartan (DIOVAN) 320 MG tablet Take 160 mg by mouth daily. Take 1/2 Tablet Daily     atorvastatin (LIPITOR) 80 MG tablet Take 1 tablet (80 mg total) by mouth daily. 90 tablet 3   No current facility-administered medications on file prior to visit.    Review of Systems:  As per HPI- otherwise negative.   Physical Examination: Vitals:   03/15/23 0903 03/15/23 0939  BP: (!) 152/90 (!) 160/100  Pulse: 65   Resp: 18   Temp: 97.6 F (36.4 C)   SpO2: 97%    Vitals:   03/15/23 0903  Weight: 205 lb 6.4 oz (93.2 kg)  Height:  (1.6 m)   Body mass index is 36.38 kg/m. Ideal Body Weight: Weight in (lb) to have BMI = 25: 140.8  GEN: no acute distress.  Obese, looks well  HEENT: Atraumatic, Normocephalic.  Ears and Nose: No external deformity. CV: RRR, No M/G/R. No JVD. No thrill. No extra heart sounds. PULM: CTA B, no wheezes, crackles, rhonchi. No retractions. No resp. distress. No accessory muscle use. ABD: S, NT, ND. No rebound. No HSM. EXTR: No c/c/e PSYCH: Normally interactive. Conversant.   BP Readings from Last 3 Encounters:  03/15/23 (!) 160/100  12/08/22 137/67  12/07/22 (!) 162/82   Results for orders placed or performed in visit on 03/15/23  Urine Culture   Specimen: Urine  Result Value Ref Range   MICRO NUMBER: [id number]    SPECIMEN QUALITY: Adequate    Sample Source URINE    STATUS: FINAL    Result: No Growth   POCT urinalysis dipstick  Result Value Ref Range   Color, UA yellow yellow   Clarity, UA cloudy (A) clear   Glucose, UA negative negative mg/dL   Bilirubin, UA negative negative   Ketones, POC UA negative negative mg/dL   Spec Grav, UA 7.829 5.621 - 1.025   Blood, UA negative negative   pH, UA 7.5 5.0 - 8.0   Protein Ur, POC negative negative mg/dL   Urobilinogen, UA 0.2 0.2 or 1.0 E.U./dL   Nitrite, UA Negative Negative   Leukocytes, UA Negative Negative    Assessment and Plan: Urinary frequency - Plan: Urine Culture, POCT  urinalysis dipstick  Urge incontinence - Plan: tolterodine (DETROL LA) 2 MG 24 hr capsule  Primary hypertension  I will be in touch with your urine culture ASAP.  However, your urine dipstick test does not appear to show any sign of infection  You are likely having some urge incontinence and overactive bladder.  Lets have you try the Detrol LA 2 mg once daily.  Also, I would suggest reducing fluid intake the last couple hours before bed.  Please let me know how this seems to be working for you.  We do of other medications we can try if this is not helpful!  Your blood pressure is a bit too high.  Lets have you increase your dose of valsartan to a whole tablet, 320 mg- please let me know how your BP responds to this change.  If you would, please try to check your BP daily for the next week or so and send me a note on mychart  Signed Abbe Amsterdam, MD Addendum 4/19, received urine culture.  It is negative Message to patient

## 2023-03-15 ENCOUNTER — Ambulatory Visit: Payer: BC Managed Care – PPO | Admitting: Family Medicine

## 2023-03-15 ENCOUNTER — Encounter: Payer: Self-pay | Admitting: Family Medicine

## 2023-03-15 VITALS — BP 160/100 | HR 65 | Temp 97.6°F | Resp 18 | Ht 63.0 in | Wt 205.4 lb

## 2023-03-15 DIAGNOSIS — R350 Frequency of micturition: Secondary | ICD-10-CM

## 2023-03-15 DIAGNOSIS — I10 Essential (primary) hypertension: Secondary | ICD-10-CM | POA: Diagnosis not present

## 2023-03-15 DIAGNOSIS — N3941 Urge incontinence: Secondary | ICD-10-CM | POA: Diagnosis not present

## 2023-03-15 LAB — POCT URINALYSIS DIP (MANUAL ENTRY)
Bilirubin, UA: NEGATIVE
Blood, UA: NEGATIVE
Glucose, UA: NEGATIVE mg/dL
Ketones, POC UA: NEGATIVE mg/dL
Leukocytes, UA: NEGATIVE
Nitrite, UA: NEGATIVE
Protein Ur, POC: NEGATIVE mg/dL
Spec Grav, UA: 1.01 (ref 1.010–1.025)
Urobilinogen, UA: 0.2 E.U./dL
pH, UA: 7.5 (ref 5.0–8.0)

## 2023-03-15 MED ORDER — TOLTERODINE TARTRATE ER 2 MG PO CP24: 2.0000 mg | ORAL_CAPSULE | Freq: Every day | ORAL | 3 refills | Status: DC

## 2023-03-16 LAB — URINE CULTURE
MICRO NUMBER:: 14837029
Result:: NO GROWTH
SPECIMEN QUALITY:: ADEQUATE

## 2023-03-17 ENCOUNTER — Encounter: Payer: Self-pay | Admitting: Family Medicine

## 2023-03-24 ENCOUNTER — Telehealth: Payer: Self-pay | Admitting: Family Medicine

## 2023-03-24 NOTE — Telephone Encounter (Signed)
In folder for signature.  °

## 2023-03-24 NOTE — Telephone Encounter (Signed)
Pt dropped off document to be filled out by provider (1page Disability Parking Placard) Pt would like to be called when document ready at [phone]. Document put at front office tray under providers name.

## 2023-04-10 ENCOUNTER — Other Ambulatory Visit: Payer: Self-pay | Admitting: Internal Medicine

## 2023-04-28 NOTE — Progress Notes (Deleted)
[facility] [address] [place], [place] [address] [phone]  Date:  05/01/2023   Name:  [name]   DOB:  1951/11/15   MRN:  [id number]  PCP:  Pearline Cables, MD    Chief Complaint: No chief complaint on file.   History of Present Illness:  [name] is a 72 y.o. very pleasant female patient who presents with the following:  Pt seen today with concern of elevated BP Last seen by myself about 6 weeks ago with concern of urinary sx History of hypertension, migraine headache, colon cancer status post sigmoid colectomy 2019, obesity, dyslipidemia, prediabetes, right lobe thyroidectomy 1992 for hyperthyroidism  She had oophorectomy last year due to concern of elevated testosterone- benign pathology  BP Readings from Last 3 Encounters:  03/15/23 (!) 160/100  12/08/22 137/67  12/07/22 (!) 162/82    Amlodipine 10 mg Carvedilol 40 daily Spiro 25 Valsartan 320   Lab Results  Component Value Date   HGBA1C 5.8 (H) 11/02/2022   Labs done in December  Noted on chart from [facility] from about 2 weeks ago, they are seeing her for headaches and cervical radiculopathy.  They also noted elevated blood pressure-it looks like they started her on clonidine in addition to her other medications  This will put her on 5 antihypertensives.  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   Hirsutism 10/11/2022   S/P panniculectomy 09/21/2020   Neck pain 12/20/2019   Colon cancer (HCC) 04/18/2018   Class 2 obesity with serious comorbidity and body mass index (BMI) of 38.0 to 38.9 in adult 07/27/2017   Vitamin D deficiency 07/27/2017   Other fatigue 06/29/2017   Shortness of breath on  exertion 06/29/2017   Class 2 obesity with serious comorbidity and body mass index (BMI) of 39.0 to 39.9 in adult 06/29/2017   Left hip pain 04/18/2017   Pre-diabetes 02/05/2016   Left thyroid nodule 11/10/2014   TBI (traumatic brain injury) (HCC) 07/15/2014   Chest pain 12/13/2013   Dyslipidemia 12/13/2013   Obesity (BMI 35.0-39.9 without comorbidity) 12/13/2013   Hypertension 01/18/2012   GERD (gastroesophageal reflux disease) 01/18/2012   Depression 01/18/2012   Migraine syndrome 01/18/2012    Past Medical History:  Diagnosis Date   Allergy    Anxiety    Arthritis    Back pain    Blood transfusion without reported diagnosis    Cancer of sigmoid colon (HCC)    Constipation    resolved with Miralax   Depression    Dyspnea    pt denies at preop on 11/02/22   Fatigue    Fatty liver    mild   GERD (gastroesophageal reflux disease)    Grade I diastolic dysfunction    Echo 08/08/2017 with Grade I DD, EF 51%, Mild Aortic regurgitation, Mild Tricuspid Regurgitation, Pulmonary pressure 30 mm Hg. She denies any chest pain, dyspnea, or peripheral edema.   Headaches due to old head injury    History of kidney stones    History of stomach ulcers    Hyperlipidemia    Hypertension    Lactose intolerance    Left hip pain    Left thyroid nodule  Lipoma of extremity 10/17/2017   Left lower   Migraines    Obesity    Panniculitis    Pneumonia    Pre-diabetes    TBI (traumatic brain injury) (HCC)    fell in 2008, some issues with short-term memory   Thyroid disease    Vitamin D deficiency     Past Surgical History:  Procedure Laterality Date   ABDOMINAL HYSTERECTOMY     ABDOMINOPLASTY/PANNICULECTOMY WITH LIPOSUCTION N/A 09/02/2020   Procedure: PANNICULECTOMY WITH LIPOSUCTION;  Surgeon: Peggye Form, DO;  Location: [facility];  Service: Plastics;  Laterality: N/A;  3 hours, please   BREAST LUMPECTOMY WITH RADIOACTIVE SEED LOCALIZATION Left 08/23/2016    Procedure: LEFT BREAST LUMPECTOMY WITH RADIOACTIVE SEED LOCALIZATION;  Surgeon: Emelia Loron, MD;  Location:  SUR[facility]ERY CENTER;  Service: General;  Laterality: Left;   BREAST SURGERY  2003   reduction   CESAREAN SECTION     x2   CHALAZION EXCISION Left 2010   COLONOSCOPY W/ POLYPECTOMY  02/16/2018   Dr. Alvino Chapel JOINT INJECTION  2011 and 2012   FLEXIBLE SIGMOIDOSCOPY N/A 04/18/2018   Procedure: FLEXIBLE SIGMOIDOSCOPY;  Surgeon: Andria Meuse, MD;  Location: [facility];  Service: General;  Laterality: N/A;   LAPAROSCOPIC SIGMOID COLECTOMY N/A 04/18/2018   Procedure: LAPAROSCOPIC ASSISTED SIGMOIDECTOMY;  Surgeon: Andria Meuse, MD;  Location: [facility];  Service: General;  Laterality: N/A;   MYOMECTOMY     MYOMECTOMY     x2   NM MYOCAR PERF WALL MOTION  03/2009   dipyridamole; normal pattern of perfusion in all regions, post-stress EF 65%, normal study    ROBOTIC ASSISTED LAPAROSCOPIC LYSIS OF ADHESION N/A 11/03/2022   Procedure: XI ROBOTIC ASSISTED LAPAROSCOPIC LYSIS OF ADHESION;  Surgeon: Carver Fila, MD;  Location: [facility];  Service: Gynecology;  Laterality: N/A;   ROBOTIC ASSISTED SALPINGO OOPHERECTOMY Left 11/03/2022   Procedure: XI ROBOTIC ASSISTED UNILATERAL SALPINGO OOPHORECTOMY, CYSTOSCOPY;  Surgeon: Carver Fila, MD;  Location: [facility];  Service: Gynecology;  Laterality: Left;   THYROIDECTOMY  1989   partial   TRANSTHORACIC ECHOCARDIOGRAM  03/2009   EF=>55%, borderline conc LVH; mild mitral annular calcif, trace MR; trace TR; AV mildly sclerotic, mild AV regurg; mild pulm valve regurg    TUBAL LIGATION      Social History   Tobacco Use   Smoking status: Never   Smokeless tobacco: Never  Vaping Use   Vaping Use: Never used  Substance Use Topics   Alcohol use: Yes    Comment: occ   Drug use: No    Family History  Problem Relation Age of Onset   Heart disease Mother        MI, HTN   Hypertension Mother    Thyroid disease Mother     Hyperlipidemia Mother    Sudden death Mother    Anxiety disorder Mother    Glaucoma Father    Diabetes Father    Thyroid disease Sister    Prostate cancer Brother    Heart disease Maternal Grandmother        MI, CVA   Heart disease Maternal Grandfather        MI   Stroke Maternal Grandfather    Heart disease Paternal Grandmother    Stroke Paternal Grandmother    Heart disease Paternal Grandfather        MI   Depression Daughter    ADD / ADHD Son    Heart attack  Paternal Uncle        HTN   Hypertension Paternal Uncle    Breast cancer Cousin    Breast cancer Paternal Great-grandmother    Colon cancer Neg Hx    Stomach cancer Neg Hx    Esophageal cancer Neg Hx    Rectal cancer Neg Hx    Colon polyps Neg Hx    Ovarian cancer Neg Hx    Endometrial cancer Neg Hx    Pancreatic cancer Neg Hx     Allergies  Allergen Reactions   Hydrochlorothiazide Other (See Comments)    Increases risk of gout flares    Botox [Botulinum Toxin Type A] Swelling   Onabotulinumtoxina Swelling   Toprol Xl [Metoprolol Succinate] Hives   Ubrelvy [Ubrogepant] Hives    Medication list has been reviewed and updated.  Current Outpatient Medications on File Prior to Visit  Medication Sig Dispense Refill   albuterol (VENTOLIN HFA) 108 (90 Base) MCG/ACT inhaler INHALE 2 PUFFS INTO THE LUNGS EVERY 6 HOURS AS NEEDED FOR WHEEZING OR SHORTNESS OF BREATH 54 g 1   amLODipine (NORVASC) 10 MG tablet Take 1 tablet (10 mg total) by mouth daily. 90 tablet 2   atorvastatin (LIPITOR) 80 MG tablet Take 1 tablet (80 mg total) by mouth daily. 90 tablet 3   carvedilol (COREG CR) 40 MG 24 hr capsule Take 1 capsule (40 mg total) by mouth daily. 90 capsule 3   cetirizine (ZYRTEC) 10 MG tablet Take 10 mg by mouth daily as needed for allergies.     Cholecalciferol (VITAMIN D3) 1.25 MG (50000 UT) CAPS Take 50,000 Units by mouth every Saturday.     desvenlafaxine (PRISTIQ) 100 MG 24 hr tablet Take 100 mg by mouth daily.      diclofenac Sodium (VOLTAREN) 1 % GEL Apply 1 Application topically 4 (four) times daily as needed (pain).     Fluocinolone Acetonide Body (DERMA-SMOOTHE/FS BODY) 0.01 % OIL Apply to scalp and leave on 8 hours or overnight as needed for eczema 118 mL 2   fluticasone (FLONASE) 50 MCG/ACT nasal spray Place 2 sprays into both nostrils daily. (Patient taking differently: Place 2 sprays into both nostrils daily as needed for allergies.) 16 g 0   frovatriptan (FROVA) 2.5 MG tablet Take 2.5 mg by mouth as needed for migraine. If recurs, may repeat after 2 hours. Max of 3 tabs in 24 hours.     gabapentin (NEURONTIN) 300 MG capsule Take 600-900 mg by mouth 3 (three) times daily as needed (headaches).     naproxen sodium (ALEVE) 220 MG tablet Take 220 mg by mouth daily as needed (knee swelling).     OLANZapine (ZYPREXA) 5 MG tablet Take 5 mg by mouth at bedtime as needed (sleep).     omeprazole (PRILOSEC) 40 MG capsule Take 40 mg by mouth daily.     polyethylene glycol (MIRALAX / GLYCOLAX) packet Take 17 g by mouth daily.     spironolactone (ALDACTONE) 25 MG tablet Take 1 tablet (25 mg total) by mouth daily. 90 tablet 3   Tetrahydrozoline HCl (MURINE FOR RED EYES OP) Place 1 drop into both eyes daily as needed (redness).     tolterodine (DETROL LA) 2 MG 24 hr capsule Take 1 capsule (2 mg total) by mouth daily. 30 capsule 3   valACYclovir (VALTREX) 1000 MG tablet Take 1 tablet (1,000 mg total) by mouth daily. For suppression (Patient taking differently: Take 1,000 mg by mouth daily as needed (fever blisters). For suppression) 90  tablet 2   valsartan (DIOVAN) 320 MG tablet TAKE 1 TABLET(320 MG) BY MOUTH DAILY 90 tablet 0   No current facility-administered medications on file prior to visit.    Review of Systems:  As per HPI- otherwise negative.   Physical Examination: There were no vitals filed for this visit. There were no vitals filed for this visit. There is no height or weight on file to  calculate BMI. Ideal Body Weight:    GEN: no acute distress. HEENT: Atraumatic, Normocephalic.  Ears and Nose: No external deformity. CV: RRR, No M/G/R. No JVD. No thrill. No extra heart sounds. PULM: CTA B, no wheezes, crackles, rhonchi. No retractions. No resp. distress. No accessory muscle use. ABD: S, NT, ND, +BS. No rebound. No HSM. EXTR: No c/c/e PSYCH: Normally interactive. Conversant.    Assessment and Plan: ***  Signed Abbe Amsterdam, MD

## 2023-05-01 ENCOUNTER — Ambulatory Visit: Payer: BC Managed Care – PPO | Admitting: Family Medicine

## 2023-05-01 DIAGNOSIS — R7303 Prediabetes: Secondary | ICD-10-CM

## 2023-05-01 DIAGNOSIS — I10 Essential (primary) hypertension: Secondary | ICD-10-CM

## 2023-06-21 ENCOUNTER — Ambulatory Visit: Payer: BC Managed Care – PPO | Attending: Internal Medicine | Admitting: Internal Medicine

## 2023-06-21 ENCOUNTER — Other Ambulatory Visit: Payer: Self-pay | Admitting: Physician Assistant

## 2023-06-21 ENCOUNTER — Encounter: Payer: Self-pay | Admitting: Internal Medicine

## 2023-06-21 VITALS — BP 140/88 | HR 53 | Ht 62.5 in | Wt 205.6 lb

## 2023-06-21 DIAGNOSIS — E785 Hyperlipidemia, unspecified: Secondary | ICD-10-CM | POA: Diagnosis not present

## 2023-06-21 DIAGNOSIS — I10 Essential (primary) hypertension: Secondary | ICD-10-CM

## 2023-06-21 MED ORDER — HYDRALAZINE HCL 25 MG PO TABS: 25.0000 mg | ORAL_TABLET | Freq: Two times a day (BID) | ORAL | 11 refills | Status: DC

## 2023-06-21 NOTE — Progress Notes (Signed)
OFFICE NOTE  Chief Complaint:  No complaints  Primary Care Physician: Pearline Cables, MD  HPI:  [name] is a pleasant 72 year old female with a strong family history of heart disease. Her mother died of a sudden heart attack at age 54, her both grandparents had heart disease and a grandmother had a stroke at age 32. She was seen remotely by cardiologist more than 3 years ago and had stress testing at that time which was negative. Back in November she was complaining of chest pain which was intermittent. It was somewhat worse at that time however she had an active bronchitis and was treated. Her symptoms improved somewhat however she still had some chest pain since that time. Some of it is associated with exertion and relieved by rest and other times it comes fairly randomly. It does feel like a squeezing sensation in the upper chest it does not radiate to the neck, arm or back. She describes the discomfort as a 5 or 6/10 in severity.  She occasionally takes nitroglycerin for this however her old prescription was only recently renewed.  She is desiring to start an exercise program and would like cardiovascular evaluation and risk stratification prior to that.  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  [name] had a recent nuclear stress test which was negative.  This demonstrated no reversible ischemia. She also had a lipid NMR performed, which demonstrated elevated LDL cholesterol of 117, with elevated LDL particle numbers to 1347.  Her contents a metabolic profile was normal.  Her blood pressure today is much improved. She denies any further chest pain and only occasionally has migraine headaches. We have not assessed her cholesterol since her last lipid profile and she's not on treatment  for her elevated cholesterol.  I saw [name] back in the office today. Her main complaint is some discomfort over the right trapezius area. She says this is usually worse with stress and tension. She denies any chest pain per se today. Recently her headaches have improved somewhat. Her cholesterol has been fairly well-controlled. Her blood pressure is at goal today. Unfortunate she's not managed to lose much weight and remains close to morbid obesity with BMI of 38.  08/02/2016  Returns today for follow-up. Over the past year she's had no complaints. Recently she was found to have a breast mass and is undergoing a biopsy later this month. She's not had a recent cholesterol check in the past year but had a fairly good cholesterol profile last saw her. She is continue to work on weight loss. She's on medication for depression which she says is working well. Her only issue right now is memory loss.  08/03/2017  [name] was seen today in follow-up. She has a migraine today. Overall though her migraines have improved some from her history of traumatic brain injury. She's taking less medication for that. She is more committed to primary prevention. Her lipid profile has improved some with weight loss recently [facility] Dr. Dalbert Garnet. She is optimistic about further weight loss which should be helpful for her risk reduction. Blood pressure is elevated today however possibly attributable to her migraine. Her last lipid profile showed an LDL of 119. She's not on a statin and  is a borderline diabetic on metformin. She has a significant family history of early onset coronary disease. She denies chest pain or shortness of breath with exertion.  09/14/2018  [name] is seen today in annual follow-up.  Unfortunately she recently was diagnosed with colon cancer.  She underwent surgical resection and is deemed cured of it.  She does have follow-up in  a year.  She did not require chemotherapy or radiation.  She was working on weight loss and was seen Dr. Dalbert Garnet however after colon surgery she could not tolerate the diet she was supposed to be on.  She had lost weight but then gained it back.  Overall she is feeling well.  Her blood sugars were borderline and she remained on metformin.  We discussed the importance of lowering her cholesterol which have been elevated with LDL at 130.  Earlier this year her LDL had come down to 118 with weight loss and dietary changes however she does not think that she will be able to make the significant dietary changes she needs to get it lower.  10/04/2019  [name] returns today for follow-up.  Overall she is under quite a bit of stress.  She is noted her blood pressures have been high recently, even higher than it was today 163/85.  She occasionally gets some right-sided chest discomfort which is worse when she is stressed.  She does not note any symptoms noticed necessarily when walking or exerting herself.  She does like to walk for exercise.  She can walk quite a distance without any leg pain but occasionally gets shooting pains in her legs.  She also has noted some numbness and tingling in her feet.  She is on gabapentin which she takes for headaches but has no diagnosis of neuropathy.  She is free of colon cancer now more than a year out.  CT of the chest for follow-up of cancer did not demonstrate any evidence of coronary calcification that I saw.  Recent lipid showed total cholesterol 140, triglycerides 82, HDL 50 and LDL 74.  06/24/2020  [name] returns today for follow-up.  Blood pressures well controlled today.  She denies any chest pain or worsening shortness of breath.  EKG shows a sinus bradycardia.  Labs from September 2020 showed an LDL 74 hemoglobin A1c 5.7.  06/21/2023  [name] is seen today in follow-up.  She reports her blood pressures have been trending upwards.  She was  seen earlier this year by one of our nurse practitioners who made some adjustments in her meds.  She is currently on 4 blood pressure medicines although blood pressure today was 140/88.  She recently had a lot of stress as her father passed away.  That has improved however she is concerned about persistently elevated blood pressures.  She denies any chest pain or shortness of breath.  PMHx:  Past Medical History:  Diagnosis Date   Allergy    Anxiety    Arthritis    Back pain    Blood transfusion without reported diagnosis    Cancer of sigmoid colon (HCC)    Constipation    resolved with Miralax   Depression    Dyspnea    pt denies at preop on 11/02/22   Fatigue    Fatty liver    mild   GERD (gastroesophageal reflux disease)    Grade I diastolic dysfunction    Echo 08/08/2017 with Grade I DD, EF 51%, Mild Aortic regurgitation, Mild Tricuspid Regurgitation, Pulmonary  pressure 30 mm Hg. She denies any chest pain, dyspnea, or peripheral edema.   Headaches due to old head injury    History of kidney stones    History of stomach ulcers    Hyperlipidemia    Hypertension    Lactose intolerance    Left hip pain    Left thyroid nodule    Lipoma of extremity 10/17/2017   Left lower   Migraines    Obesity    Panniculitis    Pneumonia    Pre-diabetes    TBI (traumatic brain injury) (HCC)    fell in 2008, some issues with short-term memory   Thyroid disease    Vitamin D deficiency     Past Surgical History:  Procedure Laterality Date   ABDOMINAL HYSTERECTOMY     ABDOMINOPLASTY/PANNICULECTOMY WITH LIPOSUCTION N/A 09/02/2020   Procedure: PANNICULECTOMY WITH LIPOSUCTION;  Surgeon: Peggye Form, DO;  Location: [facility];  Service: Plastics;  Laterality: N/A;  3 hours, please   BREAST LUMPECTOMY WITH RADIOACTIVE SEED LOCALIZATION Left 08/23/2016   Procedure: LEFT BREAST LUMPECTOMY WITH RADIOACTIVE SEED LOCALIZATION;  Surgeon: Emelia Loron, MD;  Location:  [facility];  Service: General;  Laterality: Left;   BREAST SURGERY  2003   reduction   CESAREAN SECTION     x2   CHALAZION EXCISION Left 2010   COLONOSCOPY W/ POLYPECTOMY  02/16/2018   Dr. Alvino Chapel JOINT INJECTION  2011 and 2012   FLEXIBLE SIGMOIDOSCOPY N/A 04/18/2018   Procedure: FLEXIBLE SIGMOIDOSCOPY;  Surgeon: Andria Meuse, MD;  Location: [facility];  Service: General;  Laterality: N/A;   LAPAROSCOPIC SIGMOID COLECTOMY N/A 04/18/2018   Procedure: LAPAROSCOPIC ASSISTED SIGMOIDECTOMY;  Surgeon: Andria Meuse, MD;  Location: [facility];  Service: General;  Laterality: N/A;   MYOMECTOMY     MYOMECTOMY     x2   NM MYOCAR PERF WALL MOTION  03/2009   dipyridamole; normal pattern of perfusion in all regions, post-stress EF 65%, normal study    ROBOTIC ASSISTED LAPAROSCOPIC LYSIS OF ADHESION N/A 11/03/2022   Procedure: XI ROBOTIC ASSISTED LAPAROSCOPIC LYSIS OF ADHESION;  Surgeon: Carver Fila, MD;  Location: [facility];  Service: Gynecology;  Laterality: N/A;   ROBOTIC ASSISTED SALPINGO OOPHERECTOMY Left 11/03/2022   Procedure: XI ROBOTIC ASSISTED UNILATERAL SALPINGO OOPHORECTOMY, CYSTOSCOPY;  Surgeon: Carver Fila, MD;  Location: [facility];  Service: Gynecology;  Laterality: Left;   THYROIDECTOMY  1989   partial   TRANSTHORACIC ECHOCARDIOGRAM  03/2009   EF=>55%, borderline conc LVH; mild mitral annular calcif, trace MR; trace TR; AV mildly sclerotic, mild AV regurg; mild pulm valve regurg    TUBAL LIGATION      FAMHx:  Family History  Problem Relation Age of Onset   Heart disease Mother        MI, HTN   Hypertension Mother    Thyroid disease Mother    Hyperlipidemia Mother    Sudden death Mother    Anxiety disorder Mother    Glaucoma Father    Diabetes Father    Thyroid disease Sister    Prostate cancer Brother    Heart disease Maternal Grandmother        MI, CVA   Heart disease Maternal Grandfather        MI   Stroke Maternal Grandfather     Heart disease Paternal Grandmother    Stroke Paternal Grandmother    Heart disease Paternal Grandfather  MI   Depression Daughter    ADD / ADHD Son    Heart attack Paternal Uncle        HTN   Hypertension Paternal Uncle    Breast cancer Cousin    Breast cancer Paternal Great-grandmother    Colon cancer Neg Hx    Stomach cancer Neg Hx    Esophageal cancer Neg Hx    Rectal cancer Neg Hx    Colon polyps Neg Hx    Ovarian cancer Neg Hx    Endometrial cancer Neg Hx    Pancreatic cancer Neg Hx     SOCHx:   reports that she has never smoked. She has never used smokeless tobacco. She reports current alcohol use. She reports that she does not use drugs.  ALLERGIES:  Allergies  Allergen Reactions   Hydrochlorothiazide Other (See Comments)    Increases risk of gout flares    Botox [Botulinum Toxin Type A] Swelling   Onabotulinumtoxina Swelling   Toprol Xl [Metoprolol Succinate] Hives   Ubrelvy [Ubrogepant] Hives    ROS: Pertinent items noted in HPI and remainder of comprehensive ROS otherwise negative.  HOME MEDS: Current Outpatient Medications  Medication Sig Dispense Refill   albuterol (VENTOLIN HFA) 108 (90 Base) MCG/ACT inhaler INHALE 2 PUFFS INTO THE LUNGS EVERY 6 HOURS AS NEEDED FOR WHEEZING OR SHORTNESS OF BREATH 54 g 1   amLODipine (NORVASC) 10 MG tablet Take 1 tablet (10 mg total) by mouth daily. 90 tablet 2   carvedilol (COREG CR) 40 MG 24 hr capsule Take 1 capsule (40 mg total) by mouth daily. 90 capsule 3   cetirizine (ZYRTEC) 10 MG tablet Take 10 mg by mouth daily as needed for allergies.     Cholecalciferol (VITAMIN D3) 1.25 MG (50000 UT) CAPS Take 50,000 Units by mouth every Saturday.     desvenlafaxine (PRISTIQ) 100 MG 24 hr tablet Take 100 mg by mouth daily.     diclofenac Sodium (VOLTAREN) 1 % GEL Apply 1 Application topically 4 (four) times daily as needed (pain).     Fluocinolone Acetonide Body (DERMA-SMOOTHE/FS BODY) 0.01 % OIL Apply to scalp and  leave on 8 hours or overnight as needed for eczema 118 mL 2   fluticasone (FLONASE) 50 MCG/ACT nasal spray Place 2 sprays into both nostrils daily. (Patient taking differently: Place 2 sprays into both nostrils daily as needed for allergies.) 16 g 0   frovatriptan (FROVA) 2.5 MG tablet Take 2.5 mg by mouth as needed for migraine. If recurs, may repeat after 2 hours. Max of 3 tabs in 24 hours.     gabapentin (NEURONTIN) 300 MG capsule Take 600-900 mg by mouth 3 (three) times daily as needed (headaches).     naproxen sodium (ALEVE) 220 MG tablet Take 220 mg by mouth daily as needed (knee swelling).     OLANZapine (ZYPREXA) 5 MG tablet Take 5 mg by mouth at bedtime as needed (sleep).     omeprazole (PRILOSEC) 40 MG capsule Take 40 mg by mouth daily.     polyethylene glycol (MIRALAX / GLYCOLAX) packet Take 17 g by mouth daily.     spironolactone (ALDACTONE) 25 MG tablet Take 1 tablet (25 mg total) by mouth daily. 90 tablet 3   Tetrahydrozoline HCl (MURINE FOR RED EYES OP) Place 1 drop into both eyes daily as needed (redness).     tolterodine (DETROL LA) 2 MG 24 hr capsule Take 1 capsule (2 mg total) by mouth daily. 30 capsule 3  valsartan (DIOVAN) 320 MG tablet TAKE 1 TABLET(320 MG) BY MOUTH DAILY 90 tablet 0   atorvastatin (LIPITOR) 80 MG tablet Take 1 tablet (80 mg total) by mouth daily. 90 tablet 3   valACYclovir (VALTREX) 1000 MG tablet Take 1 tablet (1,000 mg total) by mouth daily. For suppression (Patient not taking: Reported on 06/21/2023) 90 tablet 2   No current facility-administered medications for this visit.    LABS/IMAGING: No results found for this or any previous visit (from the past 48 hour(s)). No results found.  VITALS: BP (!) 140/88   Pulse (!) 53   Ht 5' 2.5" (1.588 m)   Wt 205 lb 9.6 oz (93.3 kg)   SpO2 96%   BMI 37.01 kg/m   EXAM: General appearance: alert, no distress and moderately obese Neck: no carotid bruit and no JVD Lungs: clear to auscultation  bilaterally Heart: regular rate and rhythm, S1, S2 normal, no murmur, click, rub or gallop Abdomen: soft, non-tender; bowel sounds normal; no masses,  no organomegaly Extremities: extremities normal, atraumatic, no cyanosis or edema Pulses: 2+ and symmetric Skin: Skin color, texture, turgor normal. No rashes or lesions Neurologic: Grossly normal  EKG: EKG Interpretation Date/Time:  Wednesday June 21 2023 09:27:16 EDT Ventricular Rate:  53 PR Interval:  154 QRS Duration:  86 QT Interval:  442 QTC Calculation: 414 R Axis:   -39  Text Interpretation: Sinus bradycardia Left axis deviation Minimal voltage criteria for LVH, may be normal variant ( R in aVL ) Anterolateral infarct (cited on or before 02-Nov-2022) When compared with ECG of 02-Nov-2022 08:39, No significant change was found Confirmed by Zoila Shutter [phone]) on 06/21/2023 9:42:50 AM    ASSESSMENT: Dyslipidemia Hypertension Obesity - [facility]  History of TBI with headaches and memory loss Strong family history of premature coronary artery disease Mild type 2 diabetes Recent colon cancer status post resection  PLAN: 1.   [name] has had issues with controlling blood pressure.  She has noted some memory issues although feels that she is pretty compliant with her medicines.  She did ask about blister packaging her medications and I encouraged her to reach out to [facility] or similar pharmacies which do this.  She probably needs more medical therapy.  She is maxed out on 4 different blood pressure medicines.  I would like to add hydralazine 25 mg twice a day.  I will also refer her to our advanced hypertension clinic for further workup and to see if she might be a candidate for investigational therapies.  Follow-up in 6 months or sooner as necessary.  Chrystie Nose, MD, [facility], FACP    [facility]  Medical Director of the Advanced Lipid Disorders &  Cardiovascular  Risk Reduction Clinic Diplomate of the American Board of Clinical Lipidology Attending Cardiologist  Direct Dial: [phone]  Fax: [phone]  Website:  [email or web address] 06/21/2023, 9:42 AM

## 2023-06-21 NOTE — Patient Instructions (Signed)
Medication Instructions:   START Hydralazine one (1 ) tablet by mouth ( 25 mg) twice daily.   *If you need a refill on your cardiac medications before your next appointment, please call your pharmacy*   Lab Work:  None ordered.  If you have labs (blood work) drawn today and your tests are completely normal, you will receive your results only by: MyChart Message (if you have MyChart) OR A paper copy in the mail If you have any lab test that is abnormal or we need to change your treatment, we will call you to review the results.   Testing/Procedures:  None ordered.   Follow-Up: At [facility], you and your health needs are our priority.  As part of our continuing mission to provide you with exceptional heart care, we have created designated Provider Care Teams.  These Care Teams include your primary Cardiologist (physician) and Advanced Practice Providers (APPs -  Physician Assistants and Nurse Practitioners) who all work together to provide you with the care you need, when you need it.  We recommend signing up for the patient portal called "MyChart".  Sign up information is provided on this After Visit Summary.  MyChart is used to connect with patients for Virtual Visits (Telemedicine).  Patients are able to view lab/test results, encounter notes, upcoming appointments, etc.  Non-urgent messages can be sent to your provider as well.   To learn more about what you can do with MyChart, go to [email or web address].    Your next appointment:   6 month(s)  Provider:   Chrystie Nose, MD     Other Instructions  Your physician wants you to follow-up in: 6 months.  You will receive a reminder letter in the mail two months in advance. If you don't receive a letter, please call our office to schedule the follow-up appointment.  You have been referred to The advanced Hypertension clinic with Dr. Duke Salvia.  The office will call you to schedule appointment.

## 2023-06-22 ENCOUNTER — Other Ambulatory Visit: Payer: Self-pay

## 2023-06-22 MED ORDER — ATORVASTATIN CALCIUM 80 MG PO TABS: 80.0000 mg | ORAL_TABLET | Freq: Every day | ORAL | 3 refills | Status: DC

## 2023-07-18 ENCOUNTER — Other Ambulatory Visit: Payer: Self-pay | Admitting: Internal Medicine

## 2023-08-01 ENCOUNTER — Encounter (INDEPENDENT_AMBULATORY_CARE_PROVIDER_SITE_OTHER): Payer: BC Managed Care – PPO | Admitting: Family Medicine

## 2023-08-01 ENCOUNTER — Other Ambulatory Visit: Payer: Self-pay | Admitting: Family Medicine

## 2023-08-01 DIAGNOSIS — R350 Frequency of micturition: Secondary | ICD-10-CM | POA: Diagnosis not present

## 2023-08-01 DIAGNOSIS — B009 Herpesviral infection, unspecified: Secondary | ICD-10-CM

## 2023-08-01 NOTE — Telephone Encounter (Signed)

## 2023-08-02 ENCOUNTER — Encounter: Payer: Self-pay | Admitting: Family Medicine

## 2023-08-02 ENCOUNTER — Other Ambulatory Visit (INDEPENDENT_AMBULATORY_CARE_PROVIDER_SITE_OTHER): Payer: BC Managed Care – PPO

## 2023-08-02 DIAGNOSIS — R350 Frequency of micturition: Secondary | ICD-10-CM | POA: Diagnosis not present

## 2023-08-02 LAB — POCT URINALYSIS DIP (MANUAL ENTRY)
Blood, UA: NEGATIVE
Glucose, UA: NEGATIVE mg/dL
Ketones, POC UA: NEGATIVE mg/dL
Nitrite, UA: NEGATIVE
Spec Grav, UA: 1.015 (ref 1.010–1.025)
Urobilinogen, UA: 0.2 U/dL
pH, UA: 6 (ref 5.0–8.0)

## 2023-08-02 LAB — URINALYSIS, MICROSCOPIC ONLY

## 2023-08-02 NOTE — Progress Notes (Signed)
Pt provided urine sample

## 2023-08-03 ENCOUNTER — Ambulatory Visit (HOSPITAL_BASED_OUTPATIENT_CLINIC_OR_DEPARTMENT_OTHER): Payer: BC Managed Care – PPO | Admitting: Family

## 2023-08-03 ENCOUNTER — Other Ambulatory Visit (HOSPITAL_BASED_OUTPATIENT_CLINIC_OR_DEPARTMENT_OTHER): Payer: Self-pay

## 2023-08-03 ENCOUNTER — Other Ambulatory Visit (HOSPITAL_COMMUNITY): Payer: Self-pay

## 2023-08-03 ENCOUNTER — Encounter (HOSPITAL_BASED_OUTPATIENT_CLINIC_OR_DEPARTMENT_OTHER): Payer: Self-pay | Admitting: Family

## 2023-08-03 ENCOUNTER — Encounter: Payer: Self-pay | Admitting: Family Medicine

## 2023-08-03 ENCOUNTER — Other Ambulatory Visit: Payer: Self-pay

## 2023-08-03 VITALS — BP 151/80 | HR 51 | Ht 62.5 in | Wt 206.0 lb

## 2023-08-03 DIAGNOSIS — R0683 Snoring: Secondary | ICD-10-CM | POA: Diagnosis not present

## 2023-08-03 DIAGNOSIS — G4730 Sleep apnea, unspecified: Secondary | ICD-10-CM

## 2023-08-03 DIAGNOSIS — E782 Mixed hyperlipidemia: Secondary | ICD-10-CM

## 2023-08-03 DIAGNOSIS — I1A0 Resistant hypertension: Secondary | ICD-10-CM

## 2023-08-03 LAB — URINE CULTURE
MICRO NUMBER:: 15420081
Result:: NO GROWTH
SPECIMEN QUALITY:: ADEQUATE

## 2023-08-03 MED ORDER — ATORVASTATIN CALCIUM 80 MG PO TABS
80.0000 mg | ORAL_TABLET | Freq: Every day | ORAL | 3 refills | Status: DC
Start: 2023-08-03 — End: 2024-05-17
  Filled 2023-08-03 (×2): qty 90, 90d supply, fill #0

## 2023-08-03 MED ORDER — HYDRALAZINE HCL 50 MG PO TABS
50.0000 mg | ORAL_TABLET | Freq: Two times a day (BID) | ORAL | 11 refills | Status: DC
  Filled 2023-08-03: qty 60, 30d supply, fill #0

## 2023-08-03 MED ORDER — AMLODIPINE BESYLATE-VALSARTAN 10-320 MG PO TABS
1.0000 | ORAL_TABLET | Freq: Every day | ORAL | 1 refills | Status: DC
  Filled 2023-08-03: qty 30, 30d supply, fill #0

## 2023-08-03 MED ORDER — CARVEDILOL PHOSPHATE ER 40 MG PO CP24
40.0000 mg | ORAL_CAPSULE | Freq: Every day | ORAL | 3 refills | Status: DC
Start: 2023-08-03 — End: 2024-04-02
  Filled 2023-08-03: qty 30, 30d supply, fill #0

## 2023-08-03 NOTE — Patient Instructions (Addendum)
Medication Instructions:  Your physician has recommended you make the following change in your medication:   Stop: Amlodipine   Stop: Valsartan   Start: amlodipine- valsartan 10-320mg  daily   Change: Hydralazine 50mg  twice daily   All medications sent to [facility] to be mailed to you   Labwork: Your physician recommends that you return for lab work today renin-aldosterone and thyroid panel    Testing/Procedures: Your physician has requested that you have a renal artery duplex. During this test, an ultrasound is used to evaluate blood flow to the kidneys. Allow one hour for this exam. Do not eat after midnight the day before and avoid carbonated beverages. Take your medications as you usually do.    Follow-Up: 6-8 WEEKS ADV HTN CLINIC   Special Instructions:  WatchPAT?  Is a FDA cleared portable home sleep study test that uses a watch and 3 points of contact to monitor 7 different channels, including your heart rate, oxygen saturations, body position, snoring, and chest motion.  The study is easy to use from the comfort of your own home and accurately detect sleep apnea.  Before bed, you attach the chest sensor, attached the sleep apnea bracelet to your nondominant hand, and attach the finger probe.  After the study, the raw data is downloaded from the watch and scored for apnea events.   For more information: [email or web address]  Patient Testing Instructions:  Do not put battery into the device until bedtime when you are ready to begin the test. Please call the support number if you need assistance after following the instructions below: 24 hour support line- [phone] or [facility] support at [phone] (option 2)  Download the IntelWatchPAT One" app through the google play store or App Store  Be sure to turn on or enable access to bluetooth in settlings on your smartphone/ device  Make sure no other bluetooth devices are on and within the vicinity of your  smartphone/ device and WatchPAT watch during testing.  Make sure to leave your smart phone/ device plugged in and charging all night.  When ready for bed:  Follow the instructions step by step in the WatchPAT One App to activate the testing device. For additional instructions, including video instruction, visit the WatchPAT One video on Youtube. You can search for WatchPat One within Youtube (video is 4 minutes and 18 seconds) or enter: https://youtube/watch?[email or web address]_vbiwxE Please note: You will be prompted to enter a Pin to connect via bluetooth when starting the test. The PIN will be assigned to you when you receive the test.  The device is disposable, but it recommended that you retain the device until you receive a call letting you know the study has been received and the results have been interpreted.  We will let you know if the study did not transmit to [place] properly after the test is completed. You do not need to call us to confirm the receipt of the test.  Please complete the test within 48 hours of receiving PIN.   Frequently Asked Questions:  What is Watch Dennie Bible one?  A single use fully disposable home sleep apnea testing device and will not need to be returned after completion.  What are the requirements to use WatchPAT one?  The be able to have a successful watchpat one sleep study, you should have your Watch pat one device, your smart phone, watch pat one app, your PIN number and Internet access What type of phone do I need?  You should have a smart phone  that uses Android 5.1 and above or any Iphone with IOS 10 and above How can I download the WatchPAT one app?  Based on your device type search for WatchPAT one app either in google play for android devices or APP store for Iphone's Where will I get my PIN for the study?  Your PIN will be provided by your physician's office. It is used for authentication and if you lose/forget your PIN, please reach out to your providers office.  I do not  have Internet at home. Can I do WatchPAT one study?  WatchPAT One needs Internet connection throughout the night to be able to transmit the sleep data. You can use your home/local internet or your cellular's data package. However, it is always recommended to use home/local Internet. It is estimated that between 20MB-30MB will be used with each study.However, the application will be looking for space in the phone to start the study.  What happens if I lose internet or bluetooth connection?  During the internet disconnection, your phone will not be able to transmit the sleep data. All the data, will be stored in your phone. As soon as the internet connection is back on, the phone will being sending the sleep data. During the bluetooth disconnection, WatchPAT one will not be able to to send the sleep data to your phone. Data will be kept in the [facility] one until two devices have bluetooth connection back on. As soon as the connection is back on, WatchPAT one will send the sleep data to the phone.  How long do I need to wear the WatchPAT one?  After you start the study, you should wear the device at least 6 hours.  How far should I keep my phone from the device?  During the night, your phone should be within 15 feet.  What happens if I leave the room for restroom or other reasons?  Leaving the room for any reason will not cause any problem. As soon as your get back to the room, both devices will reconnect and will continue to send the sleep data. Can I use my phone during the sleep study?  Yes, you can use your phone as usual during the study. But it is recommended to put your watchpat one on when you are ready to go to bed.  How will I get my study results?  A soon as you completed your study, your sleep data will be sent to the provider. They will then share the results with you when they are ready.

## 2023-08-03 NOTE — Progress Notes (Signed)
Advanced [facility]pertension [facility] Initial Assessment:    Date:  08/03/2023   ID:  [name], DOB Nov 01, 1951, MRN [id number]  PCP:  Pearline Cables, MD  Cardiologist:  Chrystie Nose, MD  Nephrologist:  Referring MD: Chrystie Nose, MD   CC: Hypertension  History of Present Illness:    [name] is a 72 y.o. 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   Strong family history of cardiovascular disease.  Myoview 03/2009 and 11/2013 were low risk.  CT abdomen without contrast 08/2022 with normal adrenal glands.  She saw Dr. Rennis Golden 06/21/2023 noting elevated blood pressure 140/88 despite 4 antihypertensive agents.  Hydralazine 25 mg twice daily was added.  She was recommended to continue amlodipine 10 mg daily, spironolactone 25 mg daily, valsartan 320 mg daily.  Carvedilol CR 40 mg daily.  [name] was diagnosed with hypertension in her early 67s. It has been easy to control until her lifestyle changed with more stress, less exercise. Blood pressure  checked with both arm and wrist cuff . Readings have been 160s/80s. she reports tobacco use never. Alcohol use socially. For exercise she enjoys walking but has had recent orthopedic difficulties which make it difficult. Also notes episodes of lightheadedness described as needing to sit down and spinning sensation. Attributes this to side effects from her migraine medications. she eats at home and outside of the home and does follow low sodium diet. Notes snoring, waking feeling tired, daytime somnolence with no prior sleep study. Notes difficulty with memory after TBI impacting medication adherence - her goal is to be on the minimum number of medications required to manage her BP. Notes stopped her Spironolactone as she was growing hair on her  chin and back.  OB did find small cyst on her ovary which was removed 10/30/22  Previous antihypertensives: Toprol - hives Hydrochlorothiazide - gout  Losartan - switched to valsartan Lisinopril - switched to ARB   Past Medical History:  Diagnosis Date   Allergy    Anxiety    Arthritis    Back pain    Blood transfusion without reported diagnosis    Cancer of sigmoid colon (HCC)    Constipation    resolved with Miralax   Depression    Dyspnea    pt denies at preop on 11/02/22   Fatigue    Fatty liver    mild   GERD (gastroesophageal reflux disease)    Grade I diastolic dysfunction    Echo 08/08/2017 with Grade I DD, EF 51%, Mild Aortic regurgitation, Mild Tricuspid Regurgitation, Pulmonary pressure 30 mm Hg. She denies any chest pain, dyspnea, or peripheral edema.   Headaches due to old head injury    History of kidney stones    History of stomach ulcers    Hyperlipidemia    Hypertension    Lactose intolerance    Left hip pain    Left thyroid nodule    Lipoma of extremity 10/17/2017   Left lower   Migraines    Obesity    Panniculitis    Pneumonia    Pre-diabetes    TBI (traumatic brain injury) (HCC)    fell in 2008, some issues with short-term memory   Thyroid disease    Vitamin D deficiency     Past Surgical History:  Procedure Laterality Date   ABDOMINAL  HYSTERECTOMY     ABDOMINOPLASTY/PANNICULECTOMY WITH LIPOSUCTION N/A 09/02/2020   Procedure: PANNICULECTOMY WITH LIPOSUCTION;  Surgeon: Peggye Form, DO;  Location: [facility];  Service: Plastics;  Laterality: N/A;  3 hours, please   BREAST LUMPECTOMY WITH RADIOACTIVE SEED LOCALIZATION Left 08/23/2016   Procedure: LEFT BREAST LUMPECTOMY WITH RADIOACTIVE SEED LOCALIZATION;  Surgeon: Emelia Loron, MD;  Location: [facility];  Service: General;  Laterality: Left;   BREAST SURGERY  2003   reduction   CESAREAN SECTION     x2   CHALAZION EXCISION Left 2010   COLONOSCOPY W/  POLYPECTOMY  02/16/2018   Dr. Alvino Chapel JOINT INJECTION  2011 and 2012   FLEXIBLE SIGMOIDOSCOPY N/A 04/18/2018   Procedure: FLEXIBLE SIGMOIDOSCOPY;  Surgeon: Andria Meuse, MD;  Location: [facility];  Service: General;  Laterality: N/A;   LAPAROSCOPIC SIGMOID COLECTOMY N/A 04/18/2018   Procedure: LAPAROSCOPIC ASSISTED SIGMOIDECTOMY;  Surgeon: Andria Meuse, MD;  Location: [facility];  Service: General;  Laterality: N/A;   MYOMECTOMY     MYOMECTOMY     x2   NM MYOCAR PERF WALL MOTION  03/2009   dipyridamole; normal pattern of perfusion in all regions, post-stress EF 65%, normal study    ROBOTIC ASSISTED LAPAROSCOPIC LYSIS OF ADHESION N/A 11/03/2022   Procedure: XI ROBOTIC ASSISTED LAPAROSCOPIC LYSIS OF ADHESION;  Surgeon: Carver Fila, MD;  Location: [facility];  Service: Gynecology;  Laterality: N/A;   ROBOTIC ASSISTED SALPINGO OOPHERECTOMY Left 11/03/2022   Procedure: XI ROBOTIC ASSISTED UNILATERAL SALPINGO OOPHORECTOMY, CYSTOSCOPY;  Surgeon: Carver Fila, MD;  Location: [facility];  Service: Gynecology;  Laterality: Left;   THYROIDECTOMY  1989   partial   TRANSTHORACIC ECHOCARDIOGRAM  03/2009   EF=>55%, borderline conc LVH; mild mitral annular calcif, trace MR; trace TR; AV mildly sclerotic, mild AV regurg; mild pulm valve regurg    TUBAL LIGATION      Current Medications: Current Meds  Medication Sig   albuterol (VENTOLIN HFA) 108 (90 Base) MCG/ACT inhaler INHALE 2 PUFFS INTO THE LUNGS EVERY 6 HOURS AS NEEDED FOR WHEEZING OR SHORTNESS OF BREATH   amLODipine-valsartan (EXFORGE) 10-320 MG tablet Take 1 tablet by mouth daily.   cetirizine (ZYRTEC) 10 MG tablet Take 10 mg by mouth daily as needed for allergies.   Cholecalciferol (VITAMIN D3) 1.25 MG (50000 UT) CAPS Take 50,000 Units by mouth every Saturday.   desvenlafaxine (PRISTIQ) 100 MG 24 hr tablet Take 100 mg by mouth daily.   diclofenac Sodium (VOLTAREN) 1 % GEL Apply 1 Application topically 4 (four) times daily as  needed (pain).   Fluocinolone Acetonide Body (DERMA-SMOOTHE/FS BODY) 0.01 % OIL Apply to scalp and leave on 8 hours or overnight as needed for eczema   fluticasone (FLONASE) 50 MCG/ACT nasal spray Place 2 sprays into both nostrils daily. (Patient taking differently: Place 2 sprays into both nostrils daily as needed for allergies.)   frovatriptan (FROVA) 2.5 MG tablet Take 2.5 mg by mouth as needed for migraine. If recurs, may repeat after 2 hours. Max of 3 tabs in 24 hours.   gabapentin (NEURONTIN) 300 MG capsule Take 600-900 mg by mouth 3 (three) times daily as needed (headaches).   naproxen sodium (ALEVE) 220 MG tablet Take 220 mg by mouth daily as needed (knee swelling).   OLANZapine (ZYPREXA) 5 MG tablet Take 5 mg by mouth at bedtime as needed (sleep).   omeprazole (PRILOSEC) 40 MG capsule Take 40 mg by mouth daily.  polyethylene glycol (MIRALAX / GLYCOLAX) packet Take 17 g by mouth daily.   Tetrahydrozoline HCl (MURINE FOR RED EYES OP) Place 1 drop into both eyes daily as needed (redness).   tolterodine (DETROL LA) 2 MG 24 hr capsule Take 1 capsule (2 mg total) by mouth daily.   valACYclovir (VALTREX) 1000 MG tablet TAKE 1 TABLET(1000 MG) BY MOUTH DAILY FOR SUPPRESSION   [DISCONTINUED] amLODipine (NORVASC) 10 MG tablet TAKE 1 TABLET(10 MG) BY MOUTH DAILY   [DISCONTINUED] atorvastatin (LIPITOR) 80 MG tablet Take 1 tablet (80 mg total) by mouth daily.   [DISCONTINUED] carvedilol (COREG CR) 40 MG 24 hr capsule Take 1 capsule (40 mg total) by mouth daily.   [DISCONTINUED] hydrALAZINE (APRESOLINE) 25 MG tablet Take 1 tablet (25 mg total) by mouth in the morning and at bedtime.   [DISCONTINUED] valsartan (DIOVAN) 320 MG tablet TAKE 1 TABLET(320 MG) BY MOUTH DAILY     Allergies:   Hydrochlorothiazide, Botox [botulinum toxin type a], Onabotulinumtoxina, Spironolactone, Toprol xl [metoprolol succinate], and Bernita Raisin [ubrogepant]   Social History   Socioeconomic History   Marital status: Married     Spouse name: Channing Mutters   Number of children: 2   Years of education: Not on file   Highest education level: Not on file  Occupational History   Occupation: retired    Associate Professor: [employer]  Tobacco Use   Smoking status: Never   Smokeless tobacco: Never  Vaping Use   Vaping status: Never Used  Substance and Sexual Activity   Alcohol use: Yes    Comment: occ   Drug use: No   Sexual activity: Yes    Birth control/protection: Surgical    Comment: Hyst/LSO  Other Topics Concern   Not on file  Social History Narrative   Not on file   Social Determinants of Health   Financial Resource Strain: Low Risk  (08/03/2023)   Overall Financial Resource Strain (CARDIA)    Difficulty of Paying Living Expenses: Not hard at all  Food Insecurity: No Food Insecurity (08/03/2023)   Hunger Vital Sign    Worried About Running Out of Food in the Last Year: Never true    Ran Out of Food in the Last Year: Never true  Transportation Needs: No Transportation Needs (08/03/2023)   PRAPARE - Administrator, Civil Service (Medical): No    Lack of Transportation (Non-Medical): No  Physical Activity: Inactive (08/03/2023)   Exercise Vital Sign    Days of Exercise per Week: 0 days    Minutes of Exercise per Session: 0 min  Stress: Not on file  Social Connections: Unknown (04/10/2022)   [facility]   Social Network    Social Network: Not on file     Family History: The patient's family history includes ADD / ADHD in her son; Anxiety disorder in her mother; Breast cancer in her cousin and paternal great-grandmother; Depression in her daughter; Diabetes in her father; Glaucoma in her father; Heart attack in her paternal uncle; Heart disease in her maternal grandfather, maternal grandmother, mother, paternal grandfather, and paternal grandmother; Hyperlipidemia in her mother; Hypertension in her mother and paternal uncle; Prostate cancer in her brother; Stroke in  her maternal grandfather and paternal grandmother; Sudden death in her mother; Thyroid disease in her mother and sister. There is no history of Colon cancer, Stomach cancer, Esophageal cancer, Rectal cancer, Colon polyps, Ovarian cancer, Endometrial cancer, or Pancreatic cancer.  ROS:   Please see the history  of present illness.    All other systems reviewed and are negative.  EKGs/Labs/Other Studies Reviewed:         Recent Labs: 11/02/2022: ALT 21; BUN 19; Creatinine, Ser 0.86; Hemoglobin 13.2; Platelets 259; Potassium 4.1; Sodium 141   Recent Lipid Panel    Component Value Date/Time   CHOL 137 05/23/2022 1447   CHOL 190 10/18/2021 1043   CHOL 206 (H) 12/19/2013 0820   TRIG 77.0 05/23/2022 1447   TRIG 111 12/19/2013 0820   HDL 50.80 05/23/2022 1447   HDL 51 10/18/2021 1043   HDL 70 12/19/2013 0820   CHOLHDL 3 05/23/2022 1447   VLDL 15.4 05/23/2022 1447   LDLCALC 71 05/23/2022 1447   LDLCALC 122 (H) 10/18/2021 1043   LDLCALC 114 (H) 12/19/2013 0820    Physical Exam:   VS:  BP (!) 151/80 Comment: right arm  Pulse (!) 51   Ht 5' 2.5" (1.588 m)   Wt 206 lb (93.4 kg)   BMI 37.08 kg/m  , BMI Body mass index is 37.08 kg/m. GENERAL:  Well appearing, overweight HEENT: Pupils equal round and reactive, fundi not visualized, oral mucosa unremarkable NECK:  No jugular venous distention, waveform within normal limits, carotid upstroke brisk and symmetric, no bruits, no thyromegaly LYMPHATICS:  No cervical adenopathy LUNGS:  Clear to auscultation bilaterally HEART:  RRR.  PMI not displaced or sustained,S1 and S2 within normal limits, no S3, no S4, no clicks, no rubs, no murmurs ABD:  Flat, positive bowel sounds normal in frequency in pitch, no bruits, no rebound, no guarding, no midline pulsatile mass, no hepatomegaly, no splenomegaly EXT:  2 plus pulses throughout, no edema, no cyanosis no clubbing SKIN:  No rashes no nodules NEURO:  Cranial nerves II through XII grossly intact,  motor grossly intact throughout PSYCH:  Cognitively intact, oriented to person place and time   ASSESSMENT/PLAN:    HTN - BP not at goal less than 130/80 despite four antihypertensive agents. Prior intolerance Toprol (hives), hydrochlorothiazide (gout flare), Spironolactone (chin/back hair growth).  Continue Coreg CR 40 mg daily.  Stop amlodipine, valsartan and start amlodipine-valsartan 10-320 mg daily.  Increase hydralazine from 25 to 50 mg twice daily.  Will send cardiac medications to [facility] long outpatient pharmacy for adherence packaging per her request. Sleep study, as below. Renal duplex to rule out RAS as contributory to uncontrolled hypertension. Labs today renin aldosterone, thyroid panel.  Did have prior right thyroidectomy but has no longer been requiring thyroid medication. Prior CT abdomen with normal adrenals, no evidence of pheochromocytoma.  Snores / Sleep disordered breathing - STOPBang 5. Itamar home sleep study provided in clinic.   HLD -follows with Dr. Rennis Golden.  Refill sent to [facility] outpatient pharmacy for adherence packaging per her request.  Screening for Secondary Hypertension:     08/03/2023   10:07 AM  Causes  Renovascular HTN Screened     - Comments 07/2023 renal duplex ordered.  Sleep Apnea Screened     - Comments 07/2023 sleep study ordered.  Thyroid Disease Screened     - Comments 07/2023 thyroid panel ordered.  Hyperaldosteronism Screened     - Comments 07/2023 renin aldosterone ordered.  Pheochromocytoma N/A     - Comments CT abdomen 08/2022 normal adrenals.  Coarctation of the Aorta N/A     - Comments BP symmetrical.    Relevant Labs/Studies:    Latest Ref Rng & Units 11/02/2022    8:30 AM 05/23/2022    2:47 PM 06/16/2021  9:44 AM  Basic Labs  Sodium 135 - 145 mmol/L 141  142  143   Potassium 3.5 - 5.1 mmol/L 4.1  3.6  3.5   Creatinine 0.44 - 1.00 mg/dL 3.87  5.64  3.32        Latest Ref Rng & Units 05/23/2022    2:47 PM 12/23/2021   10:33  AM  Thyroid   TSH 0.35 - 5.50 uIU/mL 0.74  0.53              Latest Ref Rng & Units 06/22/2022    8:19 AM 06/14/2022    8:45 AM  Cortisol  Cortisol  ug/dL 1.2  95.1        07/05/4165    9:30 AM  Renovascular   Renal Artery [place] Completed Yes        Disposition:    FU with MD/PharmD in 6-8 weeks    Medication Adjustments/Labs and Tests Ordered: Current medicines are reviewed at length with the patient today.  Concerns regarding medicines are outlined above.  Orders Placed This Encounter  Procedures   Aldosterone + renin activity w/ ratio   Thyroid Panel With TSH   Itamar Sleep Study   VAS US RENAL ARTERY DUPLEX   Meds ordered this encounter  Medications   amLODipine-valsartan (EXFORGE) 10-320 MG tablet    Sig: Take 1 tablet by mouth daily.    Dispense:  90 tablet    Refill:  1    Requests adherence packaging - mail delivery please    Order Specific Question:   Supervising Provider    Answer:   Jodelle Red [[id number]]   atorvastatin (LIPITOR) 80 MG tablet    Sig: Take 1 tablet (80 mg total) by mouth daily.    Dispense:  90 tablet    Refill:  3    Requests adherence packaging - mail delivery please    Order Specific Question:   Supervising Provider    Answer:   Jodelle Red [[id number]]   carvedilol (COREG CR) 40 MG 24 hr capsule    Sig: Take 1 capsule (40 mg total) by mouth daily.    Dispense:  90 capsule    Refill:  3    Requests adherence packaging - mail delivery please    Order Specific Question:   Supervising Provider    Answer:   Jodelle Red [[id number]]   hydrALAZINE (APRESOLINE) 50 MG tablet    Sig: Take 1 tablet (50 mg total) by mouth in the morning and at bedtime.    Dispense:  60 tablet    Refill:  11    Requests adherence packaging - mail delivery please    Order Specific Question:   Supervising Provider    Answer:   Jodelle Red [[id number]]     Signed, Alver Sorrow, NP  08/03/2023 10:08 AM    [facility]

## 2023-08-04 ENCOUNTER — Other Ambulatory Visit: Payer: Self-pay

## 2023-08-07 ENCOUNTER — Other Ambulatory Visit (HOSPITAL_COMMUNITY): Payer: Self-pay

## 2023-08-07 ENCOUNTER — Other Ambulatory Visit: Payer: Self-pay

## 2023-08-17 LAB — THYROID PANEL WITH TSH
Free Thyroxine Index: 2.4 (ref 1.2–4.9)
T3 Uptake Ratio: 23 % — ABNORMAL LOW (ref 24–39)
T4, Total: 10.3 ug/dL (ref 4.5–12.0)
TSH: 0.212 u[IU]/mL — ABNORMAL LOW (ref 0.450–4.500)

## 2023-08-17 LAB — ALDOSTERONE + RENIN ACTIVITY W/ RATIO
Aldos/Renin Ratio: >144.3 — ABNORMAL HIGH (ref 0.0–30.0)
Aldosterone: 24.1 ng/dL (ref 0.0–30.0)
Renin Activity, Plasma: <0.167 ng/mL/hr — ABNORMAL LOW (ref 0.167–5.380)

## 2023-08-18 ENCOUNTER — Telehealth (HOSPITAL_BASED_OUTPATIENT_CLINIC_OR_DEPARTMENT_OTHER): Payer: Self-pay | Admitting: *Deleted

## 2023-08-18 DIAGNOSIS — Z5181 Encounter for therapeutic drug level monitoring: Secondary | ICD-10-CM

## 2023-08-18 MED ORDER — EPLERENONE 25 MG PO TABS: 25.0000 mg | ORAL_TABLET | Freq: Every day | ORAL | 3 refills | Status: DC

## 2023-08-18 NOTE — Telephone Encounter (Signed)
-----   Message from Alver Sorrow sent at 08/18/2023 10:09 AM EDT ----- Lab work reveals hyperaldosteronism. Usually treated with Spironolactone, however as she did not tolerate previously, start Eplerenone 25mg  daily with BMP in 1 week.   Thyroid panel with subclinical hyperthyroidism which has also been noted previously. No indication for medical therapy at this time. Would recommend repeat thyroid panel in 3 months.

## 2023-08-18 NOTE — Telephone Encounter (Signed)
Spoke with patient, agreeable with plan  Send Rx to pharmacy and ordered labs

## 2023-08-22 NOTE — Addendum Note (Signed)
Addended by: Marlene Lard on: 08/22/2023 01:43 PM   Modules accepted: Orders

## 2023-08-23 ENCOUNTER — Ambulatory Visit (INDEPENDENT_AMBULATORY_CARE_PROVIDER_SITE_OTHER): Payer: BC Managed Care – PPO

## 2023-08-23 ENCOUNTER — Other Ambulatory Visit: Payer: Self-pay | Admitting: Family Medicine

## 2023-08-23 DIAGNOSIS — N3941 Urge incontinence: Secondary | ICD-10-CM

## 2023-08-23 DIAGNOSIS — I1A0 Resistant hypertension: Secondary | ICD-10-CM

## 2023-08-25 ENCOUNTER — Encounter (HOSPITAL_BASED_OUTPATIENT_CLINIC_OR_DEPARTMENT_OTHER): Payer: Self-pay | Admitting: *Deleted

## 2023-08-25 ENCOUNTER — Other Ambulatory Visit: Payer: Self-pay | Admitting: Family Medicine

## 2023-08-25 DIAGNOSIS — N3941 Urge incontinence: Secondary | ICD-10-CM

## 2023-09-05 ENCOUNTER — Telehealth (HOSPITAL_BASED_OUTPATIENT_CLINIC_OR_DEPARTMENT_OTHER): Payer: Self-pay

## 2023-09-05 NOTE — Telephone Encounter (Signed)
Patient [name] ordered 9/5, please advise if PA required

## 2023-09-15 ENCOUNTER — Telehealth: Payer: Self-pay

## 2023-09-15 NOTE — Telephone Encounter (Signed)
**Note De-Identified Thamas Appleyard Obfuscation** Ordering provider: Gillian Shields, NP Associated diagnoses: Snoring-R06.83 and Sleep disordered breathing-G47.30 WatchPAT PA obtained on 09/15/2023 by Aldred Mase, Lorelle Formosa, LPN Kollyns Mickelson the [employer] portal: The following solutions for the service date entered do not require Pre-Authorization by [employer]. CPT Code: 54098 Patient notified of PIN ([phone]) on 09/15/2023 [place] Notification Method: phone.  The pt stated that she will attempt to do her HST within 2 weeks of today's date but she has concerns that she may not be able to operate the [facility] One-HST Device that we provided her with. She is aware to call us back if she needs assistance  Phone note routed to covering staff for follow-up.

## 2023-09-18 ENCOUNTER — Encounter (INDEPENDENT_AMBULATORY_CARE_PROVIDER_SITE_OTHER): Payer: BC Managed Care – PPO | Admitting: Cardiology

## 2023-09-18 ENCOUNTER — Telehealth (HOSPITAL_BASED_OUTPATIENT_CLINIC_OR_DEPARTMENT_OTHER): Payer: Self-pay | Admitting: Family

## 2023-09-18 DIAGNOSIS — G4733 Obstructive sleep apnea (adult) (pediatric): Secondary | ICD-10-CM | POA: Diagnosis not present

## 2023-09-18 NOTE — Telephone Encounter (Signed)
Pt is calling due to problems with her "watch pat 1". She states she called the number to get help to get it started - the people on the line stated they could not help her because she wasn't in the system. They look by name, dob, etc., no luck. She states she hasn't been able to do the sleep test because she hasn't been able to do this test and fears she will need to reschedule her appointment with Dan Humphreys on 10/24. Please advise.

## 2023-09-18 NOTE — Telephone Encounter (Signed)
Verified patient device is registered in the system. Returned call to patient. Since calling in patient was able to find the correct application and get into the system. Will take test ASAP, advised patient still okay for follow up 10/24.

## 2023-09-19 ENCOUNTER — Other Ambulatory Visit: Payer: Self-pay | Admitting: Family Medicine

## 2023-09-19 DIAGNOSIS — N3941 Urge incontinence: Secondary | ICD-10-CM

## 2023-09-21 ENCOUNTER — Telehealth (HOSPITAL_BASED_OUTPATIENT_CLINIC_OR_DEPARTMENT_OTHER): Payer: Self-pay | Admitting: Internal Medicine

## 2023-09-21 ENCOUNTER — Ambulatory Visit (INDEPENDENT_AMBULATORY_CARE_PROVIDER_SITE_OTHER): Payer: BC Managed Care – PPO | Admitting: Family

## 2023-09-21 ENCOUNTER — Encounter (HOSPITAL_BASED_OUTPATIENT_CLINIC_OR_DEPARTMENT_OTHER): Payer: Self-pay | Admitting: Family

## 2023-09-21 VITALS — BP 138/84 | HR 65 | Ht 63.0 in | Wt 209.5 lb

## 2023-09-21 DIAGNOSIS — I10 Essential (primary) hypertension: Secondary | ICD-10-CM

## 2023-09-21 DIAGNOSIS — E269 Hyperaldosteronism, unspecified: Secondary | ICD-10-CM | POA: Diagnosis not present

## 2023-09-21 DIAGNOSIS — E782 Mixed hyperlipidemia: Secondary | ICD-10-CM | POA: Diagnosis not present

## 2023-09-21 DIAGNOSIS — R0683 Snoring: Secondary | ICD-10-CM

## 2023-09-21 DIAGNOSIS — I1A0 Resistant hypertension: Secondary | ICD-10-CM

## 2023-09-21 MED ORDER — HYDRALAZINE HCL 50 MG PO TABS: 50.0000 mg | ORAL_TABLET | Freq: Three times a day (TID) | ORAL | 3 refills | Status: DC

## 2023-09-21 NOTE — Telephone Encounter (Signed)
Good morning, Patient had an appointment with Dan Humphreys today 09/21/2023. Patient need a follow up appointment with [facility] in January 2025 per AVS. When looking at [facility] schedule I don't see any appointments. Could you please assist with scheduling appointment in jan with Hilty please and thank you

## 2023-09-21 NOTE — Patient Instructions (Signed)
Medication Instructions:  Your physician has recommended you make the following change in your medication:  HYDRALAZINE 50 MG THREE TIMES DAILY  *If you need a refill on your cardiac medications before your next appointment, please call your pharmacy*   Lab Work: BMP today  Testing/Procedures: NONE.   Follow-Up: At [facility], you and your health needs are our priority.  As part of our continuing mission to provide you with exceptional heart care, we have created designated Provider Care Teams.  These Care Teams include your primary Cardiologist (physician) and Advanced Practice Providers (APPs -  Physician Assistants and Nurse Practitioners) who all work together to provide you with the care you need, when you need it.  We recommend signing up for the patient portal called "MyChart".  Sign up information is provided on this After Visit Summary.  MyChart is used to connect with patients for Virtual Visits (Telemedicine).  Patients are able to view lab/test results, encounter notes, upcoming appointments, etc.  Non-urgent messages can be sent to your provider as well.   To learn more about what you can do with MyChart, go to [email or web address].    Your next appointment:   Follow up with Dr. Rennis Golden in January 2025 Follow up with Gillian Shields, NP in 4-5 months ([facility] CLINIC)

## 2023-09-21 NOTE — Progress Notes (Signed)
[facility]pert[facility] Assessment:    Date:  09/21/2023   ID:  MORGHAN HELF, DOB 1951-04-16, MRN [id number]  PCP:  Pearline Cables, MD  Cardiologist:  Chrystie Nose, MD  Nephrologist:  Referring MD: Pearline Cables, MD   CC: Hypertension  History of Present Illness:    DANAIJA JEWART is a 72 y.o. 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   Strong family history of cardiovascular disease.  Myoview 03/2009 and 11/2013 were low risk.  CT abdomen without contrast 08/2022 with normal adrenal glands.  She saw Dr. Rennis Golden 06/21/2023 noting elevated blood pressure 140/88 despite 4 antihypertensive agents.  Hydralazine 25 mg twice daily was added.  She was recommended to continue amlodipine 10 mg daily, spironolactone 25 mg daily, valsartan 320 mg daily.  Carvedilol CR 40 mg daily.  Established with [facility] 08/03/23. Diagnosed with hypertension in her early 75s. Home readings 160s/80s. No tobacco use and rare etoh use. Was following low sodium diet. Amlodipine, Valsartan were consolidated to Amlodipine-Valsartan 10-320mg  daily. Hydralazine was increased from 25 to 50mg  BID. Sleep study ordered but not yet performed. Renal duplex 07/2023 with no renal artery stenosis. Renin-aldosterone consistent with hyperaldosteronism but given prior intolerance to Spironolactone, Eplerenone was started. Thyroid panel with subclinical hyperthyroidism (TSH 0.212, T4 10.3, T3 23) with repeat labs in 3 months ordered.   Presents today for follow up. BP elevated 142/78 with recheck 138/84 today. BPs at home in the mornings before medications, 150s-160s/90s-100s. Minimal orthostatic hypotension but better after eating. Did at-home sleep study a couple of nights ago, notes poor sleep that night. Exercising  three times per week, walking 1.5 miles on the treadmill. Tries to eat at home, eats small portions and snacks a lot due to colon cancer history. Bilateral LE edema after being on feet, right greater than left after injury 20 years ago. Concerned about father's passing from PAD, notes no claudication with walking. Reviewed prior ABI 2020 were reassuring and Atorvastatin helps prevent PAD. She agrees repeat testing is not necessary at this time. Reports no shortness of breath nor dyspnea on exertion. Reports no chest pain, pressure, or tightness. No orthopnea, PND. Reports no palpitations.   Previous antihypertensives: Toprol - hives Hydrochlorothiazide - gout  Losartan - switched to valsartan Lisinopril - switched to ARB Spironolactone - chin hair growth   Past Medical History:  Diagnosis Date   Allergy    Anxiety    Arthritis    Back pain    Blood transfusion without reported diagnosis    Cancer of sigmoid colon (HCC)    Constipation    resolved with Miralax   Depression    Dyspnea    pt denies at preop on 11/02/22   Fatigue    Fatty liver    mild   GERD (gastroesophageal reflux disease)    Grade I diastolic dysfunction    Echo 08/08/2017 with Grade I DD, EF 51%, Mild Aortic regurgitation, Mild Tricuspid Regurgitation, Pulmonary pressure 30 mm Hg. She denies any chest pain, dyspnea, or peripheral edema.   Headaches due to old head injury    History of kidney stones    History of stomach ulcers    Hyperaldosteronism (HCC)    Hyperlipidemia    Hypertension    Lactose intolerance    Left hip pain    Left  thyroid nodule    Lipoma of extremity 10/17/2017   Left lower   Migraines    Obesity    Panniculitis    Pneumonia    Pre-diabetes    TBI (traumatic brain injury) (HCC)    fell in 2008, some issues with short-term memory   Thyroid disease    Vitamin D deficiency     Past Surgical History:  Procedure Laterality Date   ABDOMINAL HYSTERECTOMY      ABDOMINOPLASTY/PANNICULECTOMY WITH LIPOSUCTION N/A 09/02/2020   Procedure: PANNICULECTOMY WITH LIPOSUCTION;  Surgeon: Peggye Form, DO;  Location: [facility];  Service: Plastics;  Laterality: N/A;  3 hours, please   BREAST LUMPECTOMY WITH RADIOACTIVE SEED LOCALIZATION Left 08/23/2016   Procedure: LEFT BREAST LUMPECTOMY WITH RADIOACTIVE SEED LOCALIZATION;  Surgeon: Emelia Loron, MD;  Location: [facility];  Service: General;  Laterality: Left;   BREAST SURGERY  2003   reduction   CESAREAN SECTION     x2   CHALAZION EXCISION Left 2010   COLONOSCOPY W/ POLYPECTOMY  02/16/2018   Dr. Alvino Chapel JOINT INJECTION  2011 and 2012   FLEXIBLE SIGMOIDOSCOPY N/A 04/18/2018   Procedure: FLEXIBLE SIGMOIDOSCOPY;  Surgeon: Andria Meuse, MD;  Location: [facility];  Service: General;  Laterality: N/A;   LAPAROSCOPIC SIGMOID COLECTOMY N/A 04/18/2018   Procedure: LAPAROSCOPIC ASSISTED SIGMOIDECTOMY;  Surgeon: Andria Meuse, MD;  Location: [facility];  Service: General;  Laterality: N/A;   MYOMECTOMY     MYOMECTOMY     x2   NM MYOCAR PERF WALL MOTION  03/2009   dipyridamole; normal pattern of perfusion in all regions, post-stress EF 65%, normal study    ROBOTIC ASSISTED LAPAROSCOPIC LYSIS OF ADHESION N/A 11/03/2022   Procedure: XI ROBOTIC ASSISTED LAPAROSCOPIC LYSIS OF ADHESION;  Surgeon: Carver Fila, MD;  Location: [facility];  Service: Gynecology;  Laterality: N/A;   ROBOTIC ASSISTED SALPINGO OOPHERECTOMY Left 11/03/2022   Procedure: XI ROBOTIC ASSISTED UNILATERAL SALPINGO OOPHORECTOMY, CYSTOSCOPY;  Surgeon: Carver Fila, MD;  Location: [facility];  Service: Gynecology;  Laterality: Left;   THYROIDECTOMY  1989   partial   TRANSTHORACIC ECHOCARDIOGRAM  03/2009   EF=>55%, borderline conc LVH; mild mitral annular calcif, trace MR; trace TR; AV mildly sclerotic, mild AV regurg; mild pulm valve regurg    TUBAL LIGATION      Current Medications: Current  Meds  Medication Sig   albuterol (VENTOLIN HFA) 108 (90 Base) MCG/ACT inhaler INHALE 2 PUFFS INTO THE LUNGS EVERY 6 HOURS AS NEEDED FOR WHEEZING OR SHORTNESS OF BREATH   amLODipine-valsartan (EXFORGE) 10-320 MG tablet Take 1 tablet by mouth daily.   atorvastatin (LIPITOR) 80 MG tablet Take 1 tablet (80 mg total) by mouth daily.   carvedilol (COREG CR) 40 MG 24 hr capsule Take 1 capsule (40 mg total) by mouth daily.   cetirizine (ZYRTEC) 10 MG tablet Take 10 mg by mouth daily as needed for allergies.   Cholecalciferol (VITAMIN D3) 1.25 MG (50000 UT) CAPS Take 50,000 Units by mouth every Saturday.   desvenlafaxine (PRISTIQ) 100 MG 24 hr tablet Take 100 mg by mouth daily.   Diclofenac Potassium,Migraine, 50 MG PACK Take 50 mg by mouth as needed.   eplerenone (INSPRA) 25 MG tablet Take 1 tablet (25 mg total) by mouth daily.   Fluocinolone Acetonide Body (DERMA-SMOOTHE/FS BODY) 0.01 % OIL Apply to scalp and leave on 8 hours or overnight as needed for eczema   fluticasone (FLONASE) 50 MCG/ACT nasal  spray Place 2 sprays into both nostrils daily. (Patient taking differently: Place 2 sprays into both nostrils daily as needed for allergies.)   frovatriptan (FROVA) 2.5 MG tablet Take 2.5 mg by mouth as needed for migraine. If recurs, may repeat after 2 hours. Max of 3 tabs in 24 hours.   gabapentin (NEURONTIN) 300 MG capsule Take 600-900 mg by mouth 3 (three) times daily as needed (headaches).   naproxen sodium (ALEVE) 220 MG tablet Take 220 mg by mouth daily as needed (knee swelling).   OLANZapine (ZYPREXA) 5 MG tablet Take 5 mg by mouth at bedtime as needed (sleep).   omeprazole (PRILOSEC) 40 MG capsule Take 40 mg by mouth daily.   polyethylene glycol (MIRALAX / GLYCOLAX) packet Take 17 g by mouth daily.   Tetrahydrozoline HCl (MURINE FOR RED EYES OP) Place 1 drop into both eyes daily as needed (redness).   tolterodine (DETROL LA) 2 MG 24 hr capsule Take 1 capsule (2 mg total) by mouth daily.    valACYclovir (VALTREX) 1000 MG tablet TAKE 1 TABLET(1000 MG) BY MOUTH DAILY FOR SUPPRESSION   [DISCONTINUED] hydrALAZINE (APRESOLINE) 50 MG tablet Take 1 tablet (50 mg total) by mouth in the morning and at bedtime.     Allergies:   Hydrochlorothiazide, Botox [botulinum toxin type a], Onabotulinumtoxina, Spironolactone, Toprol xl [metoprolol succinate], and Bernita Raisin [ubrogepant]   Social History   Socioeconomic History   Marital status: Married    Spouse name: Channing Mutters   Number of children: 2   Years of education: Not on file   Highest education level: Not on file  Occupational History   Occupation: retired    Associate Professor: [employer]  Tobacco Use   Smoking status: Never   Smokeless tobacco: Never  Vaping Use   Vaping status: Never Used  Substance and Sexual Activity   Alcohol use: Yes    Comment: occ   Drug use: No   Sexual activity: Yes    Birth control/protection: Surgical    Comment: Hyst/LSO  Other Topics Concern   Not on file  Social History Narrative   Not on file   Social Determinants of Health   Financial Resource Strain: Low Risk  (08/03/2023)   Overall Financial Resource Strain (CARDIA)    Difficulty of Paying Living Expenses: Not hard at all  Food Insecurity: No Food Insecurity (08/03/2023)   Hunger Vital Sign    Worried About Running Out of Food in the Last Year: Never true    Ran Out of Food in the Last Year: Never true  Transportation Needs: No Transportation Needs (08/03/2023)   PRAPARE - Administrator, Civil Service (Medical): No    Lack of Transportation (Non-Medical): No  Physical Activity: Inactive (08/03/2023)   Exercise Vital Sign    Days of Exercise per Week: 0 days    Minutes of Exercise per Session: 0 min  Stress: Not on file  Social Connections: Unknown (04/10/2022)   [facility]   Social Network    Social Network: Not on file     Family History: The patient's family history includes ADD / ADHD in  her son; Anxiety disorder in her mother; Breast cancer in her cousin and paternal great-grandmother; Depression in her daughter; Diabetes in her father; Glaucoma in her father; Heart attack in her paternal uncle; Heart disease in her maternal grandfather, maternal grandmother, mother, paternal grandfather, and paternal grandmother; Hyperlipidemia in her mother; Hypertension in her mother and paternal uncle; Prostate  cancer in her brother; Stroke in her maternal grandfather and paternal grandmother; Sudden death in her mother; Thyroid disease in her mother and sister. There is no history of Colon cancer, Stomach cancer, Esophageal cancer, Rectal cancer, Colon polyps, Ovarian cancer, Endometrial cancer, or Pancreatic cancer.  ROS:   Please see the history of present illness.    All other systems reviewed and are negative.  EKGs/Labs/Other Studies Reviewed:         Recent Labs: 11/02/2022: ALT 21; BUN 19; Creatinine, Ser 0.86; Hemoglobin 13.2; Platelets 259; Potassium 4.1; Sodium 141 08/03/2023: TSH 0.212   Recent Lipid Panel    Component Value Date/Time   CHOL 137 05/23/2022 1447   CHOL 190 10/18/2021 1043   CHOL 206 (H) 12/19/2013 0820   TRIG 77.0 05/23/2022 1447   TRIG 111 12/19/2013 0820   HDL 50.80 05/23/2022 1447   HDL 51 10/18/2021 1043   HDL 70 12/19/2013 0820   CHOLHDL 3 05/23/2022 1447   VLDL 15.4 05/23/2022 1447   LDLCALC 71 05/23/2022 1447   LDLCALC 122 (H) 10/18/2021 1043   LDLCALC 114 (H) 12/19/2013 0820    Physical Exam:   VS:  BP 138/84 (BP Location: Right Arm, Patient Position: Sitting, Cuff Size: Normal)   Pulse 65   Ht 5\' 3"  (1.6 m)   Wt 209 lb 8 oz (95 kg)   SpO2 97%   BMI 37.11 kg/m  , BMI Body mass index is 37.11 kg/m. GENERAL:  Well appearing, overweight HEENT: Pupils equal round and reactive, fundi not visualized, oral mucosa unremarkable NECK:  No jugular venous distention, waveform within normal limits, carotid upstroke brisk and symmetric, no bruits, no  thyromegaly LYMPHATICS:  No cervical adenopathy LUNGS:  Clear to auscultation bilaterally HEART:  RRR.  PMI not displaced or sustained,S1 and S2 within normal limits, no S3, no S4, no clicks, no rubs, no murmurs ABD:  Flat, positive bowel sounds normal in frequency in pitch, no bruits, no rebound, no guarding, no midline pulsatile mass, no hepatomegaly, no splenomegaly EXT:  2 plus pulses throughout, no edema, no cyanosis no clubbing SKIN:  No rashes no nodules NEURO:  Cranial nerves II through XII grossly intact, motor grossly intact throughout PSYCH:  Cognitively intact, oriented to person place and time   ASSESSMENT/PLAN:    HTN / Hyperaldosteronism - BP not at goal less than 130/80 despite four antihypertensive agents. Prior intolerance Toprol (hives), hydrochlorothiazide (gout flare), Spironolactone (chin/back hair growth). Continue amlodipine-valsartan, carvedilol, and eplerenone. Increase hydralazine to 50 mg TID. Discussed to monitor BP at home at least 2 hours after medications and sitting for 5-10 minutes.  Sleep study completed, as below. Renal duplex negative for RAS.  Labs 08/03/23 consistent with hyperaldosteronism. Tolerating eplerenone. Update BMP since starting Eplerenone.  Prior CT abdomen with normal adrenals, no evidence of pheochromocytoma.  Snores / Sleep disordered breathing - STOPBang 5. [place] home sleep study completed, awaiting results.   HLD - follows with Dr. Rennis Golden. Continue atorvastatin. Heart healthy diet and regular cardiovascular exercise encouraged.   Venous insufficiency - Bilateral LE edema in the evenings. None appreciated on exam today. Discussed elevating feet and using compression stockings. Educated on concerns of father's PAD, had ABIs 10/21/19 that were normal and is asymptomatic with no claudication, reassurance provided.   Screening for Secondary Hypertension:     08/03/2023   10:07 AM  Causes  Renovascular HTN Screened     - Comments 07/2023  renal duplex ordered.  Sleep Apnea Screened     -  Comments 07/2023 sleep study ordered.  Thyroid Disease Screened     - Comments 07/2023 thyroid panel ordered.  Hyperaldosteronism Screened     - Comments 07/2023 renin aldosterone ordered.  Pheochromocytoma N/A     - Comments CT abdomen 08/2022 normal adrenals.  Coarctation of the Aorta N/A     - Comments BP symmetrical.    Relevant Labs/Studies:    Latest Ref Rng & Units 11/02/2022    8:30 AM 05/23/2022    2:47 PM 06/16/2021    9:44 AM  Basic Labs  Sodium 135 - 145 mmol/L 141  142  143   Potassium 3.5 - 5.1 mmol/L 4.1  3.6  3.5   Creatinine 0.44 - 1.00 mg/dL 3.08  6.57  8.46        Latest Ref Rng & Units 08/03/2023   10:55 AM 05/23/2022    2:47 PM  Thyroid   TSH 0.450 - 4.500 uIU/mL 0.212  0.74        Latest Ref Rng & Units 08/03/2023   10:55 AM  Renin/Aldosterone   Aldosterone 0.0 - 30.0 ng/dL 96.2   Aldos/Renin Ratio 0.0 - 30.0 >144.3           Latest Ref Rng & Units 06/22/2022    8:19 AM 06/14/2022    8:45 AM  Cortisol  Cortisol  ug/dL 1.2  95.2        8/41/3244   10:53 AM  Renovascular   Renal Artery [place] Completed Yes        Disposition:    FU with ADV HTN clinic in 4-5 mos   Medication Adjustments/Labs and Tests Ordered: Current medicines are reviewed at length with the patient today.  Concerns regarding medicines are outlined above.  Orders Placed This Encounter  Procedures   Basic metabolic panel   Meds ordered this encounter  Medications   hydrALAZINE (APRESOLINE) 50 MG tablet    Sig: Take 1 tablet (50 mg total) by mouth 3 (three) times daily.    Dispense:  252 tablet    Refill:  3    Requests adherence packaging - mail delivery please     Signed, Alver Sorrow, NP  09/21/2023 9:22 AM    [facility]

## 2023-09-22 LAB — BASIC METABOLIC PANEL
BUN/Creatinine Ratio: 14 (ref 12–28)
BUN: 10 mg/dL (ref 8–27)
CO2: 27 mmol/L (ref 20–29)
Calcium: 9.9 mg/dL (ref 8.7–10.3)
Chloride: 104 mmol/L (ref 96–106)
Creatinine, Ser: 0.73 mg/dL (ref 0.57–1.00)
Glucose: 101 mg/dL — ABNORMAL HIGH (ref 70–99)
Potassium: 3.7 mmol/L (ref 3.5–5.2)
Sodium: 146 mmol/L — ABNORMAL HIGH (ref 134–144)
eGFR: 88 mL/min/{1.73_m2} (ref >59)

## 2023-09-22 NOTE — Telephone Encounter (Signed)
Routed to scheduler for assistance.

## 2023-09-22 NOTE — Telephone Encounter (Signed)
Ordering provider: Gillian Shields Associated diagnoses: G47.30-R06.83 WatchPAT PA obtained on 09/22/2023 by Latrelle Dodrill, CMA. Authorization: do not require Pre-Authorization Patient notified of PIN ([id number]) on 09/22/2023 via Notification Method: phone.

## 2023-09-26 ENCOUNTER — Ambulatory Visit: Payer: BC Managed Care – PPO | Attending: Family

## 2023-09-26 NOTE — Procedures (Signed)
SLEEP STUDY REPORT Patient Information Study Date: 09/18/2023 Patient Name: [name] Patient ID: [id number] Birth Date: 06/14/1951 Age: 72 Gender: Female BMI: 36.7 (W=207 lb, H=5' 3'') Referring Physician: Gillian Shields, NP  TEST DESCRIPTION: Home sleep apnea testing was completed using the WatchPat, a Type 1 device, utilizing peripheral arterial tonometry (PAT), chest movement, actigraphy, pulse oximetry, pulse rate, body position and snore. AHI was calculated with apnea and hypopnea using valid sleep time as the denominator. RDI includes apneas, hypopneas, and RERAs. The data acquired and the scoring of sleep and all associated events were performed in accordance with the recommended standards and specifications as outlined in the AASM Manual for the Scoring of Sleep and Associated Events 2.2.0 (2015).  FINDINGS:  1. Mild Obstructive Sleep Apnea with AHI 6.2/hr overall but moderate during REM sleep with REM AHI 14.9/hr.  2. No Central Sleep Apnea with pAHIc 0/hr.  3. Oxygen desaturations as low as 86%.  4. Moderate snoring was present. O2 sats were < 88% for 0.4 min.  5. Total sleep time was 7 hrs and 22 min.  6. 32.7% of total sleep time was spent in REM sleep.  7. Normal sleep onset latency at 20 min.  8. Shortened REM sleep onset latency at 80 min.  9. Total awakenings were 11. 10. Arrhythmia detection: None  DIAGNOSIS: Mild to Moderate Obstructive Sleep Apnea (G47.33)  RECOMMENDATIONS: 1. Clinical correlation of these findings is necessary. The decision to treat obstructive sleep apnea (OSA) is usually based on the presence of apnea symptoms or the presence of associated medical conditions such as Hypertension, Congestive Heart Failure, Atrial Fibrillation or Obesity. The most common symptoms of OSA are snoring, gasping for breath while sleeping, daytime sleepiness and fatigue.  2. Initiating apnea therapy is recommended given the presence of symptoms  and/or associated conditions. Recommend proceeding with one of the following:   a. Auto-CPAP therapy with a pressure range of 5-20cm H2O.   b. An oral appliance (OA) that can be obtained from certain dentists with expertise in sleep medicine. These are primarily of use in non-obese patients with mild and moderate disease.   c. An ENT consultation which may be useful to look for specific causes of obstruction and possible treatment options.   d. If patient is intolerant to PAP therapy, consider referral to ENT for evaluation for hypoglossal nerve stimulator.  3. Close follow-up is necessary to ensure success with CPAP or oral appliance therapy for maximum benefit .  4. A follow-up oximetry study on CPAP is recommended to assess the adequacy of therapy and determine the need for supplemental oxygen or the potential need for Bi-level therapy. An arterial blood gas to determine the adequacy of baseline ventilation and oxygenation should also be considered.  5. Healthy sleep recommendations include: adequate nightly sleep (normal 7-9 hrs/night), avoidance of caffeine after noon and alcohol near bedtime, and maintaining a sleep environment that is cool, dark and quiet.  6. Weight loss for overweight patients is recommended. Even modest amounts of weight loss can significantly improve the severity of sleep apnea.  7. Snoring recommendations include: weight loss where appropriate, side sleeping, and avoidance of alcohol before bed.  8. Operation of motor vehicle should be avoided when sleepy.  Signature: Armanda Magic, MD; [facility]; Diplomat, American Board of Sleep Medicine Electronically Signed: 09/26/2023 4:54:00 AM

## 2023-10-20 ENCOUNTER — Telehealth: Payer: Self-pay | Admitting: *Deleted

## 2023-10-20 DIAGNOSIS — G4733 Obstructive sleep apnea (adult) (pediatric): Secondary | ICD-10-CM

## 2023-10-20 DIAGNOSIS — I10 Essential (primary) hypertension: Secondary | ICD-10-CM

## 2023-10-20 NOTE — Telephone Encounter (Signed)
-----   Message from Armanda Magic sent at 09/26/2023  4:55 AM EDT ----- Please let patient know that they have sleep apnea and recommend treating with CPAP.  Please order an auto CPAP from 4-15cm H2O with heated humidity and mask of choice.  Order overnight pulse ox on CPAP.  Followup with me in 6 weeks.

## 2023-10-20 NOTE — Telephone Encounter (Signed)
The patient has been notified of the result and verbalized understanding.  All questions (if any) were answered. Latrelle Dodrill, CMA 10/20/2023 3:14 PM     Upon patient request DME selection is [facility] Patient understands he will be contacted by [facility] to set up his cpap. Patient understands to call if [facility] does not contact him with new setup in a timely manner. Patient understands they will be called once confirmation has been received from [facility] that they have received their new machine to schedule 10 week follow up appointment.   [facility] notified of new cpap order  Please add to airview Patient was grateful for the call and thanked me.

## 2023-11-01 NOTE — Telephone Encounter (Signed)
Patient is calling with question concerning the cpap. Please advise

## 2023-11-07 ENCOUNTER — Encounter: Payer: Self-pay | Admitting: *Deleted

## 2023-11-08 NOTE — Telephone Encounter (Signed)
Reached out to [facility] to check on patients order and [facility] says they never received it so the order has been resent today.

## 2023-12-22 ENCOUNTER — Encounter (HOSPITAL_BASED_OUTPATIENT_CLINIC_OR_DEPARTMENT_OTHER): Payer: Self-pay | Admitting: Internal Medicine

## 2023-12-22 ENCOUNTER — Ambulatory Visit (HOSPITAL_BASED_OUTPATIENT_CLINIC_OR_DEPARTMENT_OTHER): Payer: 59 | Admitting: Internal Medicine

## 2023-12-22 VITALS — BP 122/78 | HR 59 | Ht 63.0 in | Wt 211.6 lb

## 2023-12-22 DIAGNOSIS — E782 Mixed hyperlipidemia: Secondary | ICD-10-CM

## 2023-12-22 DIAGNOSIS — G4733 Obstructive sleep apnea (adult) (pediatric): Secondary | ICD-10-CM | POA: Diagnosis not present

## 2023-12-22 DIAGNOSIS — E269 Hyperaldosteronism, unspecified: Secondary | ICD-10-CM

## 2023-12-22 DIAGNOSIS — I10 Essential (primary) hypertension: Secondary | ICD-10-CM | POA: Diagnosis not present

## 2023-12-22 NOTE — Progress Notes (Signed)
OFFICE NOTE  Chief Complaint:  No complaints  Primary Care Physician: Pearline Cables, MD  HPI:  [name] is a pleasant 73 year old female with a strong family history of heart disease. Her mother died of a sudden heart attack at age 70, her both grandparents had heart disease and a grandmother had a stroke at age 83. She was seen remotely by cardiologist more than 3 years ago and had stress testing at that time which was negative. Back in November she was complaining of chest pain which was intermittent. It was somewhat worse at that time however she had an active bronchitis and was treated. Her symptoms improved somewhat however she still had some chest pain since that time. Some of it is associated with exertion and relieved by rest and other times it comes fairly randomly. It does feel like a squeezing sensation in the upper chest it does not radiate to the neck, arm or back. She describes the discomfort as a 5 or 6/10 in severity.  She occasionally takes nitroglycerin for this however her old prescription was only recently renewed.  She is desiring to start an exercise program and would like cardiovascular evaluation and risk stratification prior to that.  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  [name] had a recent nuclear stress test which was negative.  This demonstrated no reversible ischemia. She also had a lipid NMR performed, which demonstrated elevated LDL cholesterol of 117, with elevated LDL particle numbers to 1347.  Her contents a metabolic profile was normal.  Her blood pressure today is much improved. She denies any further chest pain and only occasionally has migraine headaches. We have not assessed her cholesterol since her last lipid profile and she's not on treatment  for her elevated cholesterol.  I saw [name] back in the office today. Her main complaint is some discomfort over the right trapezius area. She says this is usually worse with stress and tension. She denies any chest pain per se today. Recently her headaches have improved somewhat. Her cholesterol has been fairly well-controlled. Her blood pressure is at goal today. Unfortunate she's not managed to lose much weight and remains close to morbid obesity with BMI of 38.  08/02/2016  Returns today for follow-up. Over the past year she's had no complaints. Recently she was found to have a breast mass and is undergoing a biopsy later this month. She's not had a recent cholesterol check in the past year but had a fairly good cholesterol profile last saw her. She is continue to work on weight loss. She's on medication for depression which she says is working well. Her only issue right now is memory loss.  08/03/2017  [name] was seen today in follow-up. She has a migraine today. Overall though her migraines have improved some from her history of traumatic brain injury. She's taking less medication for that. She is more committed to primary prevention. Her lipid profile has improved some with weight loss recently [facility] Dr. Dalbert Garnet. She is optimistic about further weight loss which should be helpful for her risk reduction. Blood pressure is elevated today however possibly attributable to her migraine. Her last lipid profile showed an LDL of 119. She's not on a statin and  is a borderline diabetic on metformin. She has a significant family history of early onset coronary disease. She denies chest pain or shortness of breath with exertion.  09/14/2018  [name] is seen today in annual follow-up.  Unfortunately she recently was diagnosed with colon cancer.  She underwent surgical resection and is deemed cured of it.  She does have follow-up in  a year.  She did not require chemotherapy or radiation.  She was working on weight loss and was seen Dr. Dalbert Garnet however after colon surgery she could not tolerate the diet she was supposed to be on.  She had lost weight but then gained it back.  Overall she is feeling well.  Her blood sugars were borderline and she remained on metformin.  We discussed the importance of lowering her cholesterol which have been elevated with LDL at 130.  Earlier this year her LDL had come down to 118 with weight loss and dietary changes however she does not think that she will be able to make the significant dietary changes she needs to get it lower.  10/04/2019  [name] returns today for follow-up.  Overall she is under quite a bit of stress.  She is noted her blood pressures have been high recently, even higher than it was today 163/85.  She occasionally gets some right-sided chest discomfort which is worse when she is stressed.  She does not note any symptoms noticed necessarily when walking or exerting herself.  She does like to walk for exercise.  She can walk quite a distance without any leg pain but occasionally gets shooting pains in her legs.  She also has noted some numbness and tingling in her feet.  She is on gabapentin which she takes for headaches but has no diagnosis of neuropathy.  She is free of colon cancer now more than a year out.  CT of the chest for follow-up of cancer did not demonstrate any evidence of coronary calcification that I saw.  Recent lipid showed total cholesterol 140, triglycerides 82, HDL 50 and LDL 74.  06/24/2020  [name] returns today for follow-up.  Blood pressures well controlled today.  She denies any chest pain or worsening shortness of breath.  EKG shows a sinus bradycardia.  Labs from September 2020 showed an LDL 74 hemoglobin A1c 5.7.  06/21/2023  [name] is seen today in follow-up.  She reports her blood pressures have been trending upwards.  She was  seen earlier this year by one of our nurse practitioners who made some adjustments in her meds.  She is currently on 4 blood pressure medicines although blood pressure today was 140/88.  She recently had a lot of stress as her father passed away.  That has improved however she is concerned about persistently elevated blood pressures.  She denies any chest pain or shortness of breath.  12/22/2023  Ms. Bonini returns today for follow-up.  Overall she says she feels significantly better.  She was diagnosed with sleep apnea and had mild to moderate obstructive sleep apnea.  She is now on CPAP and she says she has been very compliant with it and her energy level has improved.  Her blood pressure is also much better and now at goal at 122/78.  She was found to have some degree of hyperaldosteronism and is now on eplerenone since she had side effects with spironolactone.  Overall her blood pressure control is much better.  She has not had recent lipid assessment since 2023 although  cholesterol had been well-controlled with an LDL of 71 at the time.  PMHx:  Past Medical History:  Diagnosis Date   Allergy    Anxiety    Arthritis    Back pain    Blood transfusion without reported diagnosis    Cancer of sigmoid colon (HCC)    Constipation    resolved with Miralax   Depression    Dyspnea    pt denies at preop on 11/02/22   Fatigue    Fatty liver    mild   GERD (gastroesophageal reflux disease)    Grade I diastolic dysfunction    Echo 08/08/2017 with Grade I DD, EF 51%, Mild Aortic regurgitation, Mild Tricuspid Regurgitation, Pulmonary pressure 30 mm Hg. She denies any chest pain, dyspnea, or peripheral edema.   Headaches due to old head injury    History of kidney stones    History of stomach ulcers    Hyperaldosteronism (HCC)    Hyperlipidemia    Hypertension    Lactose intolerance    Left hip pain    Left thyroid nodule    Lipoma of extremity 10/17/2017   Left lower   Migraines     Obesity    Panniculitis    Pneumonia    Pre-diabetes    TBI (traumatic brain injury) (HCC)    fell in 2008, some issues with short-term memory   Thyroid disease    Vitamin D deficiency     Past Surgical History:  Procedure Laterality Date   ABDOMINAL HYSTERECTOMY     ABDOMINOPLASTY/PANNICULECTOMY WITH LIPOSUCTION N/A 09/02/2020   Procedure: PANNICULECTOMY WITH LIPOSUCTION;  Surgeon: Peggye Form, DO;  Location: [facility];  Service: Plastics;  Laterality: N/A;  3 hours, please   BREAST LUMPECTOMY WITH RADIOACTIVE SEED LOCALIZATION Left 08/23/2016   Procedure: LEFT BREAST LUMPECTOMY WITH RADIOACTIVE SEED LOCALIZATION;  Surgeon: Emelia Loron, MD;  Location: [facility];  Service: General;  Laterality: Left;   BREAST SURGERY  2003   reduction   CESAREAN SECTION     x2   CHALAZION EXCISION Left 2010   COLONOSCOPY W/ POLYPECTOMY  02/16/2018   Dr. Alvino Chapel JOINT INJECTION  2011 and 2012   FLEXIBLE SIGMOIDOSCOPY N/A 04/18/2018   Procedure: FLEXIBLE SIGMOIDOSCOPY;  Surgeon: Andria Meuse, MD;  Location: [facility];  Service: General;  Laterality: N/A;   LAPAROSCOPIC SIGMOID COLECTOMY N/A 04/18/2018   Procedure: LAPAROSCOPIC ASSISTED SIGMOIDECTOMY;  Surgeon: Andria Meuse, MD;  Location: [facility];  Service: General;  Laterality: N/A;   MYOMECTOMY     MYOMECTOMY     x2   NM MYOCAR PERF WALL MOTION  03/2009   dipyridamole; normal pattern of perfusion in all regions, post-stress EF 65%, normal study    ROBOTIC ASSISTED LAPAROSCOPIC LYSIS OF ADHESION N/A 11/03/2022   Procedure: XI ROBOTIC ASSISTED LAPAROSCOPIC LYSIS OF ADHESION;  Surgeon: Carver Fila, MD;  Location: [facility];  Service: Gynecology;  Laterality: N/A;   ROBOTIC ASSISTED SALPINGO OOPHERECTOMY Left 11/03/2022   Procedure: XI ROBOTIC ASSISTED UNILATERAL SALPINGO OOPHORECTOMY, CYSTOSCOPY;  Surgeon: Carver Fila, MD;  Location: [facility];  Service: Gynecology;   Laterality: Left;   THYROIDECTOMY  1989   partial   TRANSTHORACIC ECHOCARDIOGRAM  03/2009   EF=>55%, borderline conc LVH; mild mitral annular calcif, trace MR; trace TR; AV mildly sclerotic, mild AV regurg; mild pulm valve regurg    TUBAL LIGATION      FAMHx:  Family History  Problem Relation  Age of Onset   Heart disease Mother        MI, HTN   Hypertension Mother    Thyroid disease Mother    Hyperlipidemia Mother    Sudden death Mother    Anxiety disorder Mother    Glaucoma Father    Diabetes Father    Thyroid disease Sister    Prostate cancer Brother    Heart disease Maternal Grandmother        MI, CVA   Heart disease Maternal Grandfather        MI   Stroke Maternal Grandfather    Heart disease Paternal Grandmother    Stroke Paternal Grandmother    Heart disease Paternal Grandfather        MI   Depression Daughter    ADD / ADHD Son    Heart attack Paternal Uncle        HTN   Hypertension Paternal Uncle    Breast cancer Cousin    Breast cancer Paternal Great-grandmother    Colon cancer Neg Hx    Stomach cancer Neg Hx    Esophageal cancer Neg Hx    Rectal cancer Neg Hx    Colon polyps Neg Hx    Ovarian cancer Neg Hx    Endometrial cancer Neg Hx    Pancreatic cancer Neg Hx     SOCHx:   reports that she has never smoked. She has never used smokeless tobacco. She reports current alcohol use. She reports that she does not use drugs.  ALLERGIES:  Allergies  Allergen Reactions   Hydrochlorothiazide Other (See Comments)    Increases risk of gout flares    Botox [Botulinum Toxin Type A] Swelling   Onabotulinumtoxina Swelling   Spironolactone     Increased hair growth on chin/back   Toprol Xl [Metoprolol Succinate] Hives   Ubrelvy [Ubrogepant] Hives    ROS: Pertinent items noted in HPI and remainder of comprehensive ROS otherwise negative.  HOME MEDS: Current Outpatient Medications  Medication Sig Dispense Refill   albuterol (VENTOLIN HFA) 108 (90 Base)  MCG/ACT inhaler INHALE 2 PUFFS INTO THE LUNGS EVERY 6 HOURS AS NEEDED FOR WHEEZING OR SHORTNESS OF BREATH 54 g 1   amLODipine-valsartan (EXFORGE) 10-320 MG tablet Take 1 tablet by mouth daily. 90 tablet 1   carvedilol (COREG CR) 40 MG 24 hr capsule Take 1 capsule (40 mg total) by mouth daily. 90 capsule 3   cetirizine (ZYRTEC) 10 MG tablet Take 10 mg by mouth daily as needed for allergies.     Cholecalciferol (VITAMIN D3) 1.25 MG (50000 UT) CAPS Take 50,000 Units by mouth every Saturday.     desvenlafaxine (PRISTIQ) 100 MG 24 hr tablet Take 100 mg by mouth daily.     diclofenac Sodium (VOLTAREN) 1 % GEL Apply 1 Application topically 4 (four) times daily as needed (pain).     eplerenone (INSPRA) 25 MG tablet Take 1 tablet (25 mg total) by mouth daily. 90 tablet 3   Fluocinolone Acetonide Body (DERMA-SMOOTHE/FS BODY) 0.01 % OIL Apply to scalp and leave on 8 hours or overnight as needed for eczema 118 mL 2   fluticasone (FLONASE) 50 MCG/ACT nasal spray Place 2 sprays into both nostrils daily. (Patient taking differently: Place 2 sprays into both nostrils daily as needed for allergies.) 16 g 0   frovatriptan (FROVA) 2.5 MG tablet Take 2.5 mg by mouth as needed for migraine. If recurs, may repeat after 2 hours. Max of 3 tabs in 24 hours.  gabapentin (NEURONTIN) 300 MG capsule Take 600-900 mg by mouth 3 (three) times daily as needed (headaches).     LORazepam (ATIVAN) 0.5 MG tablet Take 0.5 mg by mouth as needed. Pt say she take for MRI     naproxen sodium (ALEVE) 220 MG tablet Take 220 mg by mouth daily as needed (knee swelling).     OLANZapine (ZYPREXA) 5 MG tablet Take 5 mg by mouth at bedtime as needed (sleep).     omeprazole (PRILOSEC) 40 MG capsule Take 40 mg by mouth daily.     polyethylene glycol (MIRALAX / GLYCOLAX) packet Take 17 g by mouth daily.     Tetrahydrozoline HCl (MURINE FOR RED EYES OP) Place 1 drop into both eyes daily as needed (redness).     tolterodine (DETROL LA) 2 MG 24 hr  capsule Take 1 capsule (2 mg total) by mouth daily. 90 capsule 0   valACYclovir (VALTREX) 1000 MG tablet TAKE 1 TABLET(1000 MG) BY MOUTH DAILY FOR SUPPRESSION 90 tablet 2   atorvastatin (LIPITOR) 80 MG tablet Take 1 tablet (80 mg total) by mouth daily. 90 tablet 3   hydrALAZINE (APRESOLINE) 50 MG tablet Take 1 tablet (50 mg total) by mouth 3 (three) times daily. 270 tablet 3   No current facility-administered medications for this visit.    LABS/IMAGING: No results found for this or any previous visit (from the past 48 hours). No results found.  VITALS: BP 122/78   Pulse (!) 59   Ht 5\' 3"  (1.6 m)   Wt 211 lb 9.6 oz (96 kg)   SpO2 98%   BMI 37.48 kg/m   EXAM: General appearance: alert, no distress and moderately obese Neck: no carotid bruit and no JVD Lungs: clear to auscultation bilaterally Heart: regular rate and rhythm, S1, S2 normal, no murmur, click, rub or gallop Abdomen: soft, non-tender; bowel sounds normal; no masses,  no organomegaly Extremities: extremities normal, atraumatic, no cyanosis or edema Pulses: 2+ and symmetric Skin: Skin color, texture, turgor normal. No rashes or lesions Neurologic: Grossly normal  EKG: EKG Interpretation Date/Time:  Friday December 22 2023 15:41:11 EST Ventricular Rate:  59 PR Interval:  144 QRS Duration:  86 QT Interval:  412 QTC Calculation: 407 R Axis:   -38  Text Interpretation: Sinus bradycardia Left axis deviation Anterolateral infarct (cited on or before 02-Nov-2022) When compared with ECG of 21-Jun-2023 09:27, No significant change was found Confirmed by Zoila Shutter [phone]) on 12/22/2023 4:03:39 PM    ASSESSMENT: Dyslipidemia Hypertension/hyperaldosteronism Obesity - [facility]  History of TBI with headaches and memory loss Strong family history of premature coronary artery disease Mild type 2 diabetes Recent colon cancer status post resection OSA on CPAP   PLAN: 1.   Mrs.  Epperly is doing much better with marked improvement in her blood pressure control.  She was diagnosed with sleep apnea and now is on CPAP and feels better and is more rested.  She is exercising more regularly.  Overall I think she is doing very well.  She has a blood pressure follow-up in the hypertension clinic next month.  No changes to her medicines today.  Follow-up with me annually or sooner as necessary.  Chrystie Nose, MD, [facility], FACP  [place]  [facility]  Medical Director of the Advanced Lipid Disorders &  Cardiovascular Risk Reduction Clinic Diplomate of the American Board of Clinical Lipidology Attending Cardiologist  Direct Dial: [phone]  Fax: [phone]  Website:  [email or web address] Nicely Johndaniel Catlin 12/22/2023, 4:03 PM

## 2023-12-22 NOTE — Patient Instructions (Signed)
Medication Instructions:  NO CHANGES  *If you need a refill on your cardiac medications before your next appointment, please call your pharmacy*   Lab Work: FASTING lipid panel -- complete before your next visit with Luther Parody NP    Follow-Up: At [facility], you and your health needs are our priority.  As part of our continuing mission to provide you with exceptional heart care, we have created designated Provider Care Teams.  These Care Teams include your primary Cardiologist (physician) and Advanced Practice Providers (APPs -  Physician Assistants and Nurse Practitioners) who all work together to provide you with the care you need, when you need it.  We recommend signing up for the patient portal called "MyChart".  Sign up information is provided on this After Visit Summary.  MyChart is used to connect with patients for Virtual Visits (Telemedicine).  Patients are able to view lab/test results, encounter notes, upcoming appointments, etc.  Non-urgent messages can be sent to your provider as well.   To learn more about what you can do with MyChart, go to [email or web address].    Your next appointment:   AS SCHEDULED with Luther Parody NP  12 months with Dr. Rennis Golden

## 2024-01-03 LAB — LIPID PANEL
Chol/HDL Ratio: 3 {ratio} (ref 0.0–4.4)
Cholesterol, Total: 205 mg/dL — ABNORMAL HIGH (ref 100–199)
HDL: 69 mg/dL (ref >39)
LDL Chol Calc (NIH): 124 mg/dL — ABNORMAL HIGH (ref 0–99)
Triglycerides: 68 mg/dL (ref 0–149)
VLDL Cholesterol Cal: 12 mg/dL (ref 5–40)

## 2024-01-05 LAB — POCT ABI - SCREENING FOR PILOT NO CHARGE
Left ABI: 1.01
Right ABI: 1.09

## 2024-01-05 LAB — HEMOGLOBIN A1C: Hemoglobin A1C: 5.8

## 2024-01-05 NOTE — Progress Notes (Signed)
 Dr to monitor A1C with med adjustments

## 2024-01-08 DIAGNOSIS — E782 Mixed hyperlipidemia: Secondary | ICD-10-CM

## 2024-01-09 MED ORDER — EZETIMIBE 10 MG PO TABS: 10.0000 mg | ORAL_TABLET | Freq: Every day | ORAL | 3 refills | Status: AC

## 2024-01-18 ENCOUNTER — Encounter (HOSPITAL_BASED_OUTPATIENT_CLINIC_OR_DEPARTMENT_OTHER): Payer: 59 | Admitting: Family

## 2024-01-18 NOTE — Progress Notes (Deleted)
 [facility] Assessment:    Date:  01/18/2024   ID:  MANAAL MANDALA, DOB [birth year], MRN [id number]  PCP:  Pearline Cables, MD  Cardiologist:  Chrystie Nose, MD  Nephrologist:  Referring MD: Pearline Cables, MD   CC: Hypertension  History of Present Illness:    [name] is a 73 y.o. 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   Strong family history of cardiovascular disease.  Myoview 03/2009 and 11/2013 were low risk.  CT abdomen without contrast 08/2022 with normal adrenal glands.  She saw Dr. Rennis Golden 06/21/2023 noting elevated blood pressure 140/88 despite 4 antihypertensive agents.  Hydralazine 25 mg twice daily was added.  She was recommended to continue amlodipine 10 mg daily, spironolactone 25 mg daily, valsartan 320 mg daily.  Carvedilol CR 40 mg daily.  Established with [facility] 08/03/23. Diagnosed with hypertension in her early 46s. Home readings 160s/80s. No tobacco use and rare etoh use. Was following low sodium diet. Amlodipine, Valsartan were consolidated to Amlodipine-Valsartan 10-320mg  daily. Hydralazine was increased from 25 to 50mg  BID. Renal duplex 07/2023 with no renal artery stenosis. Renin-aldosterone consistent with hyperaldosteronism but given prior intolerance to Spironolactone, Eplerenone was started. Thyroid panel with subclinical hyperthyroidism (TSH 0.212, T4 10.3, T3 23) with repeat labs in 3 months ordered.   Last seen 09/21/23 in [facility] . Sleep study completed, recommended for CPAP. Hydralazine increased to 50mg  TID. She saw Dr. Rennis Golden for lipid management 12/22/23 with improvement in sleep since starting CPAP and BP at goal. Zetia was added as LDL not at goal <70.   Presents today for follow up.  ***Exercising three times per week, walking 1.5 miles on the treadmill. Tries to eat at home, eats small portions and snacks a lot due to colon cancer history. Bilateral LE edema after being on feet, right greater than left after injury 20 years ago. ***  Previous antihypertensives: Toprol - hives Hydrochlorothiazide - gout  Losartan - switched to valsartan Lisinopril - switched to ARB Spironolactone - chin hair growth   Past Medical History:  Diagnosis Date   Allergy    Anxiety    Arthritis    Back pain    Blood transfusion without reported diagnosis    Cancer of sigmoid colon (HCC)    Constipation    resolved with Miralax   Depression    Dyspnea    pt denies at preop on 11/02/22   Fatigue    Fatty liver    mild   GERD (gastroesophageal reflux disease)    Grade I diastolic dysfunction    Echo 08/08/2017 with Grade I DD, EF 51%, Mild Aortic regurgitation, Mild Tricuspid Regurgitation, Pulmonary pressure 30 mm Hg. She denies any chest pain, dyspnea, or peripheral edema.   Headaches due to old head injury    History of kidney stones    History of stomach ulcers    Hyperaldosteronism (HCC)    Hyperlipidemia    Hypertension    Lactose intolerance    Left hip pain    Left thyroid nodule    Lipoma of extremity 10/17/2017   Left lower   Migraines    Obesity    Panniculitis    Pneumonia    Pre-diabetes    TBI (traumatic brain injury) (HCC)    fell in 2008, some issues  with short-term memory   Thyroid disease    Vitamin D deficiency     Past Surgical History:  Procedure Laterality Date   ABDOMINAL HYSTERECTOMY     ABDOMINOPLASTY/PANNICULECTOMY WITH LIPOSUCTION N/A 09/02/2020   Procedure: PANNICULECTOMY WITH LIPOSUCTION;  Surgeon: Peggye Form, DO;  Location: [facility];  Service: Plastics;  Laterality: N/A;  3 hours, please   BREAST LUMPECTOMY WITH RADIOACTIVE SEED LOCALIZATION Left 08/23/2016   Procedure: LEFT BREAST LUMPECTOMY WITH RADIOACTIVE  SEED LOCALIZATION;  Surgeon: Emelia Loron, MD;  Location: [facility];  Service: General;  Laterality: Left;   BREAST SURGERY  2003   reduction   CESAREAN SECTION     x2   CHALAZION EXCISION Left 2010   COLONOSCOPY W/ POLYPECTOMY  02/16/2018   Dr. Alvino Chapel JOINT INJECTION  2011 and 2012   FLEXIBLE SIGMOIDOSCOPY N/A 04/18/2018   Procedure: FLEXIBLE SIGMOIDOSCOPY;  Surgeon: Andria Meuse, MD;  Location: [facility];  Service: General;  Laterality: N/A;   LAPAROSCOPIC SIGMOID COLECTOMY N/A 04/18/2018   Procedure: LAPAROSCOPIC ASSISTED SIGMOIDECTOMY;  Surgeon: Andria Meuse, MD;  Location: [facility];  Service: General;  Laterality: N/A;   MYOMECTOMY     MYOMECTOMY     x2   NM MYOCAR PERF WALL MOTION  03/2009   dipyridamole; normal pattern of perfusion in all regions, post-stress EF 65%, normal study    ROBOTIC ASSISTED LAPAROSCOPIC LYSIS OF ADHESION N/A 11/03/2022   Procedure: XI ROBOTIC ASSISTED LAPAROSCOPIC LYSIS OF ADHESION;  Surgeon: Carver Fila, MD;  Location: [facility];  Service: Gynecology;  Laterality: N/A;   ROBOTIC ASSISTED SALPINGO OOPHERECTOMY Left 11/03/2022   Procedure: XI ROBOTIC ASSISTED UNILATERAL SALPINGO OOPHORECTOMY, CYSTOSCOPY;  Surgeon: Carver Fila, MD;  Location: [facility];  Service: Gynecology;  Laterality: Left;   THYROIDECTOMY  1989   partial   TRANSTHORACIC ECHOCARDIOGRAM  03/2009   EF=>55%, borderline conc LVH; mild mitral annular calcif, trace MR; trace TR; AV mildly sclerotic, mild AV regurg; mild pulm valve regurg    TUBAL LIGATION      Current Medications: No outpatient medications have been marked as taking for the 01/18/24 encounter (Appointment) with Alver Sorrow, NP.     Allergies:   Hydrochlorothiazide, Botox [botulinum toxin type a], Onabotulinumtoxina, Spironolactone, Toprol xl [metoprolol succinate], and Bernita Raisin [ubrogepant]   Social History   Socioeconomic History   Marital status: Married    Spouse  name: Channing Mutters   Number of children: 2   Years of education: Not on file   Highest education level: Not on file  Occupational History   Occupation: retired    Associate Professor: [employer]  Tobacco Use   Smoking status: Never   Smokeless tobacco: Never  Vaping Use   Vaping status: Never Used  Substance and Sexual Activity   Alcohol use: Yes    Comment: occ   Drug use: No   Sexual activity: Yes    Birth control/protection: Surgical    Comment: Hyst/LSO  Other Topics Concern   Not on file  Social History Narrative   Not on file   Social Drivers of Health   Financial Resource Strain: Low Risk  (08/03/2023)   Overall Financial Resource Strain (CARDIA)    Difficulty of Paying Living Expenses: Not hard at all  Food Insecurity: No Food Insecurity (01/05/2024)   Hunger Vital Sign    Worried About Running Out of Food in the Last Year: Never true    Ran Out of  Food in the Last Year: Never true  Transportation Needs: No Transportation Needs (01/05/2024)   PRAPARE - Administrator, Civil Service (Medical): No    Lack of Transportation (Non-Medical): No  Physical Activity: Inactive (08/03/2023)   Exercise Vital Sign    Days of Exercise per Week: 0 days    Minutes of Exercise per Session: 0 min  Stress: Not on file  Social Connections: Unknown (04/10/2022)   Received from [facility]   Social Network    Social Network: Not on file     Family History: The patient's family history includes ADD / ADHD in her son; Anxiety disorder in her mother; Breast cancer in her cousin and paternal great-grandmother; Depression in her daughter; Diabetes in her father; Glaucoma in her father; Heart attack in her paternal uncle; Heart disease in her maternal grandfather, maternal grandmother, mother, paternal grandfather, and paternal grandmother; Hyperlipidemia in her mother; Hypertension in her mother and paternal uncle; Prostate cancer in her brother; Stroke in her maternal  grandfather and paternal grandmother; Sudden death in her mother; Thyroid disease in her mother and sister. There is no history of Colon cancer, Stomach cancer, Esophageal cancer, Rectal cancer, Colon polyps, Ovarian cancer, Endometrial cancer, or Pancreatic cancer.  ROS:   Please see the history of present illness.    All other systems reviewed and are negative.  EKGs/Labs/Other Studies Reviewed:         Recent Labs: 08/03/2023: TSH 0.212 09/21/2023: BUN 10; Creatinine, Ser 0.73; Potassium 3.7; Sodium 146   Recent Lipid Panel    Component Value Date/Time   CHOL 205 (H) 01/03/2024 1001   CHOL 206 (H) 12/19/2013 0820   TRIG 68 01/03/2024 1001   TRIG 111 12/19/2013 0820   HDL 69 01/03/2024 1001   HDL 70 12/19/2013 0820   CHOLHDL 3.0 01/03/2024 1001   CHOLHDL 3 05/23/2022 1447   VLDL 15.4 05/23/2022 1447   LDLCALC 124 (H) 01/03/2024 1001   LDLCALC 114 (H) 12/19/2013 0820    Physical Exam:   VS:  There were no vitals taken for this visit. , BMI There is no height or weight on file to calculate BMI. GENERAL:  Well appearing, overweight HEENT: Pupils equal round and reactive, fundi not visualized, oral mucosa unremarkable NECK:  No jugular venous distention, waveform within normal limits, carotid upstroke brisk and symmetric, no bruits, no thyromegaly LYMPHATICS:  No cervical adenopathy LUNGS:  Clear to auscultation bilaterally HEART:  RRR.  PMI not displaced or sustained,S1 and S2 within normal limits, no S3, no S4, no clicks, no rubs, no murmurs ABD:  Flat, positive bowel sounds normal in frequency in pitch, no bruits, no rebound, no guarding, no midline pulsatile mass, no hepatomegaly, no splenomegaly EXT:  2 plus pulses throughout, no edema, no cyanosis no clubbing SKIN:  No rashes no nodules NEURO:  Cranial nerves II through XII grossly intact, motor grossly intact throughout PSYCH:  Cognitively intact, oriented to person place and time   ASSESSMENT/PLAN:    HTN /  Hyperaldosteronism - Prior intolerance Toprol (hives), hydrochlorothiazide (gout flare), Spironolactone (chin/back hair growth). Continue *** Discussed to monitor BP at home at least 2 hours after medications and sitting for 5-10 minutes.  Renal duplex negative for RAS.  Labs 08/03/23 consistent with hyperaldosteronism. Tolerating eplerenone.  Prior CT abdomen with normal adrenals, no evidence of pheochromocytoma.  OSA -   HLD - follows with Dr. Rennis Golden. Continue atorvastatin, Zetia. Heart healthy diet and regular cardiovascular exercise encouraged.  Venous insufficiency - Bilateral LE edema in the evenings. None appreciated on exam today. Discussed elevating feet and using compression stockings. Educated on concerns of father's PAD, had ABIs 10/21/19 that were normal and is asymptomatic with no claudication, reassurance provided. ***  Screening for Secondary Hypertension:     08/03/2023   10:07 AM  Causes  Renovascular HTN Screened     - Comments 07/2023 renal duplex ordered.  Sleep Apnea Screened     - Comments 07/2023 sleep study ordered.  Thyroid Disease Screened     - Comments 07/2023 thyroid panel ordered.  Hyperaldosteronism Screened     - Comments 07/2023 renin aldosterone ordered.  Pheochromocytoma N/A     - Comments CT abdomen 08/2022 normal adrenals.  Coarctation of the Aorta N/A     - Comments BP symmetrical.    Relevant Labs/Studies:    Latest Ref Rng & Units 09/21/2023    9:37 AM 11/02/2022    8:30 AM 05/23/2022    2:47 PM  Basic Labs  Sodium 134 - 144 mmol/L 146  141  142   Potassium 3.5 - 5.2 mmol/L 3.7  4.1  3.6   Creatinine 0.57 - 1.00 mg/dL 2.53  6.64  4.03        Latest Ref Rng & Units 08/03/2023   10:55 AM 05/23/2022    2:47 PM  Thyroid   TSH 0.450 - 4.500 uIU/mL 0.212  0.74        Latest Ref Rng & Units 08/03/2023   10:55 AM  Renin/Aldosterone   Aldosterone 0.0 - 30.0 ng/dL 47.4   Aldos/Renin Ratio 0.0 - 30.0 >144.3           Latest Ref Rng & Units  06/22/2022    8:19 AM 06/14/2022    8:45 AM  Cortisol  Cortisol  ug/dL 1.2  25.9        5/63/8756   10:53 AM  Renovascular   Renal Artery [place] Completed Yes        Disposition:    FU with ADV HTN clinic in ***   Medication Adjustments/Labs and Tests Ordered: Current medicines are reviewed at length with the patient today.  Concerns regarding medicines are outlined above.  No orders of the defined types were placed in this encounter.  No orders of the defined types were placed in this encounter.    Signed, Alver Sorrow, NP  01/18/2024 8:08 AM    [facility]

## 2024-01-30 ENCOUNTER — Telehealth: Payer: Self-pay | Admitting: Family

## 2024-01-30 NOTE — Telephone Encounter (Signed)
 Spoke with [facility] regarding thyroid panel, had dx code F26.49 New code R94.6, abnormal thyroid functions given

## 2024-01-30 NOTE — Telephone Encounter (Signed)
 Misty Stanley with [employer] called in to speak with nurse about an invalid diagnosis code from lab on 08/03/23

## 2024-02-12 ENCOUNTER — Ambulatory Visit (HOSPITAL_BASED_OUTPATIENT_CLINIC_OR_DEPARTMENT_OTHER): Admitting: Cardiovascular Disease

## 2024-02-12 ENCOUNTER — Encounter (HOSPITAL_BASED_OUTPATIENT_CLINIC_OR_DEPARTMENT_OTHER): Payer: Self-pay | Admitting: Cardiovascular Disease

## 2024-02-12 VITALS — BP 154/90 | HR 65 | Ht 63.0 in | Wt 208.0 lb

## 2024-02-12 DIAGNOSIS — Z5181 Encounter for therapeutic drug level monitoring: Secondary | ICD-10-CM

## 2024-02-12 DIAGNOSIS — E269 Hyperaldosteronism, unspecified: Secondary | ICD-10-CM | POA: Diagnosis not present

## 2024-02-12 DIAGNOSIS — E785 Hyperlipidemia, unspecified: Secondary | ICD-10-CM

## 2024-02-12 DIAGNOSIS — I10 Essential (primary) hypertension: Secondary | ICD-10-CM | POA: Diagnosis not present

## 2024-02-12 MED ORDER — EPLERENONE 50 MG PO TABS: 50.0000 mg | ORAL_TABLET | Freq: Every day | ORAL | 3 refills | Status: AC

## 2024-02-12 NOTE — Progress Notes (Signed)
 Hypertension Clinic Follow Up:    Date:  02/12/2024   ID:  [name], DOB [birth year], MRN [id number]  PCP:  Pearline Cables, MD  Cardiologist:  Chrystie Nose, MD   Referring MD: Pearline Cables, MD   CC: Hypertension  History of Present Illness:    [name] is a 73 y.o. female with a hx of hyperlipidemia, hypertension, prediabetes, OSA on CPAP, chronic migraines, colon cancer status post surgical resection, and surgical thyroidectomy here for follow-up.  She first establish care in the advanced hypertension clinic with Shelby Shields, NP on 07/2023.  She is requesting for help and blood pressures remain uncontrolled on this.  Spironolactone, valsartan, carvedilol, and amlodipine.  She saw [name] and hydralazine was increased.  Renal Dopplers were negative for renal artery stenosis.  Consistent with hyperaldosteronism.  Has not tolerated eplerenone.  Prior CT of the abdomen without contrast 08/2022 revealed normal adrenal glands.  She was diagnosed with OSA and started on CPAP.  [name] has a history of supraventricular tachycardia (SVT) and has experienced several episodes since her initial hospitalization. She has been unable to tolerate several different beta blockers that have been tried for her condition. She is currently taking eplerenone for blood pressure management, which she believes has made a positive difference. Her home blood pressure readings usually range between 130 and 140 mmHg, and she has recently acquired a new blood pressure cuff. She experiences pre-migraine symptoms, which can affect her blood pressure readings.  She has a history of hyperaldosteronism and underwent a CT scan in 2023, which showed normal adrenal glands without nodules. She uses a CPAP machine and reports sleeping well, which has improved her energy levels during the day. She no longer requires a sleeping pill that was previously prescribed by another  doctor.  She reports having a recent bronchial infection which made her too sick to take her medications for a few days, but she has resumed her regimen and feels her condition is stable.     Previous antihypertensives: Toprol - hives Hydrochlorothiazide - gout  Losartan - switched to valsartan Lisinopril - switched to ARB Spironolactone - chin hair growth  Past Medical History:  Diagnosis Date   Allergy    Anxiety    Arthritis    Back pain    Blood transfusion without reported diagnosis    Cancer of sigmoid colon (HCC)    Constipation    resolved with Miralax   Depression    Dyspnea    pt denies at preop on 11/02/22   Fatigue    Fatty liver    mild   GERD (gastroesophageal reflux disease)    Grade I diastolic dysfunction    Echo 08/08/2017 with Grade I DD, EF 51%, Mild Aortic regurgitation, Mild Tricuspid Regurgitation, Pulmonary pressure 30 mm Hg. She denies any chest pain, dyspnea, or peripheral edema.   Headaches due to old head injury    History of kidney stones    History of stomach ulcers    Hyperaldosteronism (HCC)    Hyperlipidemia    Hypertension    Lactose intolerance    Left hip pain    Left thyroid nodule    Lipoma of extremity 10/17/2017   Left lower   Migraines    Obesity    Panniculitis    Pneumonia    Pre-diabetes    TBI (traumatic brain injury) (HCC)    fell in 2008, some issues with short-term memory   Thyroid disease  Vitamin D deficiency     Past Surgical History:  Procedure Laterality Date   ABDOMINAL HYSTERECTOMY     ABDOMINOPLASTY/PANNICULECTOMY WITH LIPOSUCTION N/A 09/02/2020   Procedure: PANNICULECTOMY WITH LIPOSUCTION;  Surgeon: Shelby Form, DO;  Location: [facility];  Service: Plastics;  Laterality: N/A;  3 hours, please   BREAST LUMPECTOMY WITH RADIOACTIVE SEED LOCALIZATION Left 08/23/2016   Procedure: LEFT BREAST LUMPECTOMY WITH RADIOACTIVE SEED LOCALIZATION;  Surgeon: Shelby Loron, MD;  Location:  [facility];  Service: General;  Laterality: Left;   BREAST SURGERY  2003   reduction   CESAREAN SECTION     x2   CHALAZION EXCISION Left 2010   COLONOSCOPY W/ POLYPECTOMY  02/16/2018   Dr. Alvino Barr JOINT INJECTION  2011 and 2012   FLEXIBLE SIGMOIDOSCOPY N/A 04/18/2018   Procedure: FLEXIBLE SIGMOIDOSCOPY;  Surgeon: Shelby Meuse, MD;  Location: [facility];  Service: General;  Laterality: N/A;   LAPAROSCOPIC SIGMOID COLECTOMY N/A 04/18/2018   Procedure: LAPAROSCOPIC ASSISTED SIGMOIDECTOMY;  Surgeon: Shelby Meuse, MD;  Location: [facility];  Service: General;  Laterality: N/A;   MYOMECTOMY     MYOMECTOMY     x2   NM MYOCAR PERF WALL MOTION  03/2009   dipyridamole; normal pattern of perfusion in all regions, post-stress EF 65%, normal study    ROBOTIC ASSISTED LAPAROSCOPIC LYSIS OF ADHESION N/A 11/03/2022   Procedure: XI ROBOTIC ASSISTED LAPAROSCOPIC LYSIS OF ADHESION;  Surgeon: Shelby Fila, MD;  Location: [facility];  Service: Gynecology;  Laterality: N/A;   ROBOTIC ASSISTED SALPINGO OOPHERECTOMY Left 11/03/2022   Procedure: XI ROBOTIC ASSISTED UNILATERAL SALPINGO OOPHORECTOMY, CYSTOSCOPY;  Surgeon: Shelby Fila, MD;  Location: [facility];  Service: Gynecology;  Laterality: Left;   THYROIDECTOMY  1989   partial   TRANSTHORACIC ECHOCARDIOGRAM  03/2009   EF=>55%, borderline conc LVH; mild mitral annular calcif, trace MR; trace TR; AV mildly sclerotic, mild AV regurg; mild pulm valve regurg    TUBAL LIGATION      Current Medications: Current Meds  Medication Sig   albuterol (VENTOLIN HFA) 108 (90 Base) MCG/ACT inhaler INHALE 2 PUFFS INTO THE LUNGS EVERY 6 HOURS AS NEEDED FOR WHEEZING OR SHORTNESS OF BREATH   amLODipine-valsartan (EXFORGE) 10-320 MG tablet Take 1 tablet by mouth daily.   carvedilol (COREG CR) 40 MG 24 hr capsule Take 1 capsule (40 mg total) by mouth daily.   cetirizine (ZYRTEC) 10 MG tablet Take 10 mg by mouth daily as needed for  allergies.   Cholecalciferol (VITAMIN D3) 1.25 MG (50000 UT) CAPS Take 50,000 Units by mouth every Saturday.   desvenlafaxine (PRISTIQ) 100 MG 24 hr tablet Take 100 mg by mouth daily.   diclofenac Sodium (VOLTAREN) 1 % GEL Apply 1 Application topically 4 (four) times daily as needed (pain).   ezetimibe (ZETIA) 10 MG tablet Take 1 tablet (10 mg total) by mouth daily.   Fluocinolone Acetonide Body (DERMA-SMOOTHE/FS BODY) 0.01 % OIL Apply to scalp and leave on 8 hours or overnight as needed for eczema   fluticasone (FLONASE) 50 MCG/ACT nasal spray Place 2 sprays into both nostrils daily. (Patient taking differently: Place 2 sprays into both nostrils daily as needed for allergies.)   frovatriptan (FROVA) 2.5 MG tablet Take 2.5 mg by mouth as needed for migraine. If recurs, may repeat after 2 hours. Max of 3 tabs in 24 hours.   gabapentin (NEURONTIN) 300 MG capsule Take 600-900 mg by mouth 3 (three) times daily as  needed (headaches).   LORazepam (ATIVAN) 0.5 MG tablet Take 0.5 mg by mouth as needed. Pt say she take for MRI   naproxen sodium (ALEVE) 220 MG tablet Take 220 mg by mouth daily as needed (knee swelling).   omeprazole (PRILOSEC) 40 MG capsule Take 40 mg by mouth daily.   polyethylene glycol (MIRALAX / GLYCOLAX) packet Take 17 g by mouth daily.   Tetrahydrozoline HCl (MURINE FOR RED EYES OP) Place 1 drop into both eyes daily as needed (redness).   tolterodine (DETROL LA) 2 MG 24 hr capsule Take 1 capsule (2 mg total) by mouth daily.   valACYclovir (VALTREX) 1000 MG tablet TAKE 1 TABLET(1000 MG) BY MOUTH DAILY FOR SUPPRESSION   [DISCONTINUED] eplerenone (INSPRA) 25 MG tablet Take 1 tablet (25 mg total) by mouth daily.     Allergies:   Hydrochlorothiazide, Botox [botulinum toxin type a], Onabotulinumtoxina, Spironolactone, Toprol xl [metoprolol succinate], and Bernita Raisin [ubrogepant]   Social History   Socioeconomic History   Marital status: Married    Spouse name: Channing Mutters   Number of children:  2   Years of education: Not on file   Highest education level: Not on file  Occupational History   Occupation: retired    Associate Professor: [employer]  Tobacco Use   Smoking status: Never   Smokeless tobacco: Never  Vaping Use   Vaping status: Never Used  Substance and Sexual Activity   Alcohol use: Yes    Comment: occ   Drug use: No   Sexual activity: Yes    Birth control/protection: Surgical    Comment: Hyst/LSO  Other Topics Concern   Not on file  Social History Narrative   Not on file   Social Drivers of Health   Financial Resource Strain: Low Risk  (08/03/2023)   Overall Financial Resource Strain (CARDIA)    Difficulty of Paying Living Expenses: Not hard at all  Food Insecurity: No Food Insecurity (01/05/2024)   Hunger Vital Sign    Worried About Running Out of Food in the Last Year: Never true    Ran Out of Food in the Last Year: Never true  Transportation Needs: No Transportation Needs (01/05/2024)   PRAPARE - Administrator, Civil Service (Medical): No    Lack of Transportation (Non-Medical): No  Physical Activity: Inactive (08/03/2023)   Exercise Vital Sign    Days of Exercise per Week: 0 days    Minutes of Exercise per Session: 0 min  Stress: Not on file  Social Connections: Unknown (04/10/2022)   [facility]   Social Network    Social Network: Not on file     Family History: The patient's family history includes ADD / ADHD in her son; Anxiety disorder in her mother; Breast cancer in her cousin and paternal great-grandmother; Depression in her daughter; Diabetes in her father; Glaucoma in her father; Heart attack in her paternal uncle; Heart disease in her maternal grandfather, maternal grandmother, mother, paternal grandfather, and paternal grandmother; Hyperlipidemia in her mother; Hypertension in her mother and paternal uncle; Prostate cancer in her brother; Stroke in her maternal grandfather and paternal grandmother;  Sudden death in her mother; Thyroid disease in her mother and sister. There is no history of Colon cancer, Stomach cancer, Esophageal cancer, Rectal cancer, Colon polyps, Ovarian cancer, Endometrial cancer, or Pancreatic cancer.  ROS:   Please see the history of present illness.     All other systems reviewed and are negative.  EKGs/Labs/Other Studies  Reviewed:    EKG:  EKG is not ordered today.   Recent Labs: 08/03/2023: TSH 0.212 09/21/2023: BUN 10; Creatinine, Ser 0.73; Potassium 3.7; Sodium 146   Recent Lipid Panel    Component Value Date/Time   CHOL 205 (H) 01/03/2024 1001   CHOL 206 (H) 12/19/2013 0820   TRIG 68 01/03/2024 1001   TRIG 111 12/19/2013 0820   HDL 69 01/03/2024 1001   HDL 70 12/19/2013 0820   CHOLHDL 3.0 01/03/2024 1001   CHOLHDL 3 05/23/2022 1447   VLDL 15.4 05/23/2022 1447   LDLCALC 124 (H) 01/03/2024 1001   LDLCALC 114 (H) 12/19/2013 0820    Physical Exam:    VS:  BP (!) 154/90   Pulse 65   Ht 5\' 3"  (1.6 m)   Wt 208 lb (94.3 kg)   SpO2 98%   BMI 36.85 kg/m  , BMI Body mass index is 36.85 kg/m. GENERAL:  Well appearing HEENT: Pupils equal round and reactive, fundi not visualized, oral mucosa unremarkable NECK:  No jugular venous distention, waveform within normal limits, carotid upstroke brisk and symmetric, no bruits, no thyromegaly LUNGS:  Clear to auscultation bilaterally HEART:  RRR.  PMI not displaced or sustained,S1 and S2 within normal limits, no S3, no S4, no clicks, no rubs, no murmurs ABD:  Flat, positive bowel sounds normal in frequency in pitch, no bruits, no rebound, no guarding, no midline pulsatile mass, no hepatomegaly, no splenomegaly EXT:  2 plus pulses throughout, no edema, no cyanosis no clubbing SKIN:  No rashes no nodules NEURO:  Cranial nerves II through XII grossly intact, motor grossly intact throughout PSYCH:  Cognitively intact, oriented to person place and time   ASSESSMENT:    1. Primary hypertension   2.  Dyslipidemia   3. Hyperaldosteronism (HCC)   4. Therapeutic drug monitoring     PLAN:    # Hypertension Hypertension with elevated aldosterone levels, possible hyperaldosteronism. Previous CT normal, but high aldosterone suggests further investigation for adrenal adenoma. Discussed laparoscopic surgery if tumor found to reduce antihypertensive medication need.  She would be interested in surgery if needed.  BP remains uncontrolled.   - Increase eplerenone to 50 mg daily. - Order CT scan of the abdomen with adrenal protocol. - Schedule basic metabolic panel in one week to monitor potassium levels. - Refer to Dr. Lavonna Rua at [facility] for evaluation of potential adrenal adenoma and surgical candidacy.  Will need to hold eplerenone for adrenal vein sampling.  - Check with insurance regarding coverage for CT scan and specialist consultation.  # Supraventricular Tachycardia (SVT) SVT with intolerance to multiple beta blockers. Under Dr. Gershon Crane care. Discussed potential ablation due to medication intolerance and persistent symptoms.  Obstructive Sleep Apnea Managed with CPAP therapy. Reports significant improvement in sleep quality and daytime energy.  Follow-up Reassess hypertension management and further evaluate potential hyperaldosteronism. - Follow up in four months. - Send MyChart message with insurance information and preferred academic center for specialist referral.        Disposition:    FU with MD/PharmD in 3-4 months   Medication Adjustments/Labs and Tests Ordered: Current medicines are reviewed at length with the patient today.  Concerns regarding medicines are outlined above.  Orders Placed This Encounter  Procedures   CT ABDOMEN W WO CONTRAST   Basic metabolic panel   Meds ordered this encounter  Medications   eplerenone (INSPRA) 50 MG tablet    Sig: Take 1 tablet (50 mg total) by mouth daily.  Dispense:  90 tablet    Refill:  3    NEW DOSE, D/C 25 MG RX      Signed, Chilton Si, MD  02/12/2024 7:22 PM    [facility]

## 2024-02-12 NOTE — Patient Instructions (Signed)
 Medication Instructions:  INCREASE YOUR EPLERNONE TO 50 MG DAILY   Labwork: BMET IN 1 WEEK   Testing/Procedures: Non-Cardiac CT scanning, (CAT scanning), is a noninvasive, special x-ray that produces cross-sectional images of the body using x-rays and a computer. CT scans help physicians diagnose and treat medical conditions. For some CT exams, a contrast material is used to enhance visibility in the area of the body being studied. CT scans provide greater clarity and reveal more details than regular x-ray exams. OF ABDOMEN   Follow-Up: 3 TO 4 MONTHS WITH CAITLIN W NP OR DR [facility]   If you need a refill on your cardiac medications before your next appointment, please call your pharmacy.  Please check with your insurance will allow you to see Dr. Esmeralda Links at [facility].  You will likely need adrenal vein sampling and possibly laparoscopic adrenalectomy.  If they will not cover doctors at [facility], you can ask about [facility] or [place].

## 2024-02-22 LAB — BASIC METABOLIC PANEL WITH GFR
BUN/Creatinine Ratio: 15 (ref 12–28)
BUN: 11 mg/dL (ref 8–27)
CO2: 25 mmol/L (ref 20–29)
Calcium: 9.8 mg/dL (ref 8.7–10.3)
Chloride: 104 mmol/L (ref 96–106)
Creatinine, Ser: 0.73 mg/dL (ref 0.57–1.00)
Glucose: 78 mg/dL (ref 70–99)
Potassium: 3.8 mmol/L (ref 3.5–5.2)
Sodium: 143 mmol/L (ref 134–144)
eGFR: 88 mL/min/{1.73_m2} (ref >59)

## 2024-02-23 NOTE — Progress Notes (Signed)
 The patient attended a screening event on  01/05/24 where her blood pressure was measured at 168/89 mmHg, and her A1C was 5.8%, placing her in the prediabetic range. During the event, the patient reported that she does not smoke, has insurance, is established with a primary care provider (PCP), and does not have identified social determinants of health (SDOH) concerns. A chart review confirmed that the patient does have an active PCP.  The patient's most recent office visit was on 02/12/24, where she was referred by her PCP for Primary Hypertension. Her BP was 154/90 at that visit. Hypertension and prediabetes is appropriately listed in her medical record, Pt is monitoring her BP at home. A follow-up appointment is scheduled for 05/15/24.  The patient demonstrates consistent engagement in her care. At this time, no additional support from the Health Equity Team is indicated.

## 2024-02-25 ENCOUNTER — Encounter (HOSPITAL_BASED_OUTPATIENT_CLINIC_OR_DEPARTMENT_OTHER): Payer: Self-pay | Admitting: Cardiovascular Disease

## 2024-02-26 ENCOUNTER — Ambulatory Visit (HOSPITAL_BASED_OUTPATIENT_CLINIC_OR_DEPARTMENT_OTHER)
Admission: RE | Admit: 2024-02-26 | Discharge: 2024-02-26 | Disposition: A | Discharge status: Home / Self Care | Source: Ambulatory Visit | Attending: Cardiovascular Disease | Admitting: Cardiovascular Disease

## 2024-02-26 ENCOUNTER — Other Ambulatory Visit (HOSPITAL_BASED_OUTPATIENT_CLINIC_OR_DEPARTMENT_OTHER): Payer: Self-pay | Admitting: Cardiovascular Disease

## 2024-02-26 DIAGNOSIS — I10 Essential (primary) hypertension: Secondary | ICD-10-CM | POA: Insufficient documentation

## 2024-02-26 DIAGNOSIS — Z5181 Encounter for therapeutic drug level monitoring: Secondary | ICD-10-CM

## 2024-02-26 DIAGNOSIS — E269 Hyperaldosteronism, unspecified: Secondary | ICD-10-CM

## 2024-02-26 DIAGNOSIS — E785 Hyperlipidemia, unspecified: Secondary | ICD-10-CM

## 2024-02-26 MED ORDER — IOHEXOL 300 MG/ML  SOLN
100.0000 mL | Freq: Once | INTRAMUSCULAR | Status: AC | PRN
  Administered 2024-02-26: 100 mL via INTRAVENOUS

## 2024-02-28 ENCOUNTER — Telehealth: Payer: Self-pay | Admitting: Gastroenterology

## 2024-02-28 ENCOUNTER — Telehealth (HOSPITAL_BASED_OUTPATIENT_CLINIC_OR_DEPARTMENT_OTHER): Payer: Self-pay | Admitting: *Deleted

## 2024-02-28 DIAGNOSIS — K862 Cyst of pancreas: Secondary | ICD-10-CM

## 2024-02-28 DIAGNOSIS — K869 Disease of pancreas, unspecified: Secondary | ICD-10-CM

## 2024-02-28 DIAGNOSIS — R978 Other abnormal tumor markers: Secondary | ICD-10-CM

## 2024-02-28 DIAGNOSIS — Z85038 Personal history of other malignant neoplasm of large intestine: Secondary | ICD-10-CM

## 2024-02-28 NOTE — Telephone Encounter (Signed)
 [name] see the note from Dr Ardean Larsen pt needs MRI first

## 2024-02-28 NOTE — Telephone Encounter (Signed)
 Referral placed.

## 2024-02-28 NOTE — Addendum Note (Signed)
 Addended by: Regis Bill B on: 02/28/2024 05:29 PM   Modules accepted: Orders

## 2024-02-28 NOTE — Telephone Encounter (Signed)
 Reviewed imaging. 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 EUS is not unreasonable to consider, but would recommend MRI/MRCP first. This will help clarify ductal anatomy and see if we can clarify diagnosis with anesthesia risk or pancreatitis risk with EUS sampling. This patient is previously Dr. Larae Grooms but I'll take over her care. Please order MRI/MRCP and a CA19-9. Once results return will discuss indication for EUS thereafter vs surveillance with MRI/MRCP. Thanks. GM

## 2024-02-28 NOTE — Telephone Encounter (Signed)
 Please check with your insurance will allow you to see Dr. Esmeralda Links at [facility].  You will likely need adrenal vein sampling and possibly laparoscopic adrenalectomy.  If they will not cover doctors at [facility], you can ask about [facility] or [place].   Dr Duke Salvia added the above information to AVS at recent visit  No referral placed yet

## 2024-02-28 NOTE — Telephone Encounter (Signed)
-----   Message from [facility] sent at 02/28/2024 10:15 AM EDT ----- Discussed findings with patient.  Please cancel referral to Dr. Othella Boyer at [facility].  She needs a referral to GI.  She has seen dr. Rob Bunting in the past.  Urgent referral please.

## 2024-02-28 NOTE — Telephone Encounter (Signed)
 Unfamiliar with the correct test to order and Dr Duke Salvia was not for certain  Have reached out to [name] to make sure ordering the correct test, Dr Duke Salvia ok with ordering prior to visit with GI

## 2024-02-28 NOTE — Telephone Encounter (Signed)
 Urgent referral in [place] for EUS.  Please review and advise scheduing.  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  Differential diagnosis includes intraductal papillary mucinous neoplasm (favored) or postinflammatory cystic lesion. Given the relatively rapid appearance of this over the last 1.5 years, gastroenterology referral for endoscopic ultrasound and fine needle aspiration is probably the most appropriate course of action-especially if the patient has not had pancreatitis in the last 1.5 years.

## 2024-02-29 NOTE — Addendum Note (Signed)
 Addended by: Marlene Lard on: 02/29/2024 08:40 AM   Modules accepted: Orders

## 2024-02-29 NOTE — Telephone Encounter (Signed)
 Sherri returned staff message to [employer], lpn. Orders signed.

## 2024-03-04 NOTE — Telephone Encounter (Signed)
 Mansouraty, Netty Starring., MD to Wendall Mola, RN     02/28/24  4:30 PM Note Reviewed imaging. 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 EUS is not unreasonable to consider, but would recommend MRI/MRCP first. This will help clarify ductal anatomy and see if we can clarify diagnosis with anesthesia risk or pancreatitis risk with EUS sampling. This patient is previously Dr. Larae Grooms but I'll take over her care. Please order MRI/MRCP and a CA19-9. Once results return will discuss indication for EUS thereafter vs surveillance with MRI/MRCP. Thanks. GM     Order placed

## 2024-03-06 LAB — HM MAMMOGRAPHY

## 2024-03-07 ENCOUNTER — Encounter (HOSPITAL_BASED_OUTPATIENT_CLINIC_OR_DEPARTMENT_OTHER): Payer: 59 | Admitting: Family

## 2024-03-07 ENCOUNTER — Encounter: Payer: Self-pay | Admitting: Family Medicine

## 2024-04-01 ENCOUNTER — Other Ambulatory Visit: Payer: Self-pay | Admitting: Physician Assistant

## 2024-04-01 DIAGNOSIS — I1A0 Resistant hypertension: Secondary | ICD-10-CM

## 2024-04-02 ENCOUNTER — Encounter: Payer: Self-pay | Admitting: Family

## 2024-04-02 ENCOUNTER — Ambulatory Visit (HOSPITAL_BASED_OUTPATIENT_CLINIC_OR_DEPARTMENT_OTHER)
Admission: RE | Admit: 2024-04-02 | Discharge: 2024-04-02 | Disposition: A | Discharge status: Home / Self Care | Source: Ambulatory Visit | Attending: Family | Admitting: Family

## 2024-04-02 ENCOUNTER — Ambulatory Visit (INDEPENDENT_AMBULATORY_CARE_PROVIDER_SITE_OTHER): Admitting: Family

## 2024-04-02 VITALS — BP 122/70 | HR 55 | Ht 63.0 in | Wt 223.6 lb

## 2024-04-02 DIAGNOSIS — R062 Wheezing: Secondary | ICD-10-CM | POA: Diagnosis not present

## 2024-04-02 DIAGNOSIS — M2550 Pain in unspecified joint: Secondary | ICD-10-CM | POA: Diagnosis not present

## 2024-04-02 DIAGNOSIS — R600 Localized edema: Secondary | ICD-10-CM | POA: Diagnosis not present

## 2024-04-02 DIAGNOSIS — K869 Disease of pancreas, unspecified: Secondary | ICD-10-CM

## 2024-04-02 LAB — COMPREHENSIVE METABOLIC PANEL WITH GFR
ALT: 63 U/L — ABNORMAL HIGH (ref 0–35)
AST: 43 U/L — ABNORMAL HIGH (ref 0–37)
Albumin: 3.9 g/dL (ref 3.5–5.2)
Alkaline Phosphatase: 85 U/L (ref 39–117)
BUN: 15 mg/dL (ref 6–23)
CO2: 30 meq/L (ref 19–32)
Calcium: 9.2 mg/dL (ref 8.4–10.5)
Chloride: 105 meq/L (ref 96–112)
Creatinine, Ser: 0.8 mg/dL (ref 0.40–1.20)
GFR: 74.12 mL/min (ref >60.00)
Glucose, Bld: 117 mg/dL — ABNORMAL HIGH (ref 70–99)
Potassium: 4.2 meq/L (ref 3.5–5.1)
Sodium: 142 meq/L (ref 135–145)
Total Bilirubin: 0.5 mg/dL (ref 0.2–1.2)
Total Protein: 6.6 g/dL (ref 6.0–8.3)

## 2024-04-02 LAB — LIPASE: Lipase: 24 U/L (ref 11.0–59.0)

## 2024-04-02 LAB — AMYLASE: Amylase: 25 U/L — ABNORMAL LOW (ref 27–131)

## 2024-04-02 LAB — CBC WITH DIFFERENTIAL/PLATELET
Basophils Absolute: 0 10*3/uL (ref 0.0–0.1)
Basophils Relative: 0.6 % (ref 0.0–3.0)
Eosinophils Absolute: 0.1 10*3/uL (ref 0.0–0.7)
Eosinophils Relative: 3.4 % (ref 0.0–5.0)
HCT: 36.7 % (ref 36.0–46.0)
Hemoglobin: 12.1 g/dL (ref 12.0–15.0)
Lymphocytes Relative: 28.3 % (ref 12.0–46.0)
Lymphs Abs: 1 10*3/uL (ref 0.7–4.0)
MCHC: 33 g/dL (ref 30.0–36.0)
MCV: 89.6 fl (ref 78.0–100.0)
Monocytes Absolute: 0.5 10*3/uL (ref 0.1–1.0)
Monocytes Relative: 15.1 % — ABNORMAL HIGH (ref 3.0–12.0)
Neutro Abs: 1.9 10*3/uL (ref 1.4–7.7)
Neutrophils Relative %: 52.6 % (ref 43.0–77.0)
Platelets: 239 10*3/uL (ref 150.0–400.0)
RBC: 4.1 Mil/uL (ref 3.87–5.11)
RDW: 14.5 % (ref 11.5–15.5)
WBC: 3.5 10*3/uL — ABNORMAL LOW (ref 4.0–10.5)

## 2024-04-02 LAB — BRAIN NATRIURETIC PEPTIDE: Pro B Natriuretic peptide (BNP): 54 pg/mL (ref 0.0–100.0)

## 2024-04-02 LAB — URIC ACID: Uric Acid, Serum: 5.6 mg/dL (ref 2.4–7.0)

## 2024-04-02 NOTE — Telephone Encounter (Signed)
 Patient is requesting a call to discuss previous notes. States she has not been contacted regarding referral recommendations. Please advise, thank you.

## 2024-04-02 NOTE — Telephone Encounter (Signed)
 Called the patient. No answer. Left message of my call.

## 2024-04-02 NOTE — Telephone Encounter (Signed)
 Move forward with MRI/MRCP. Dx - pancreatic lesion. Thanks. GM

## 2024-04-02 NOTE — Progress Notes (Signed)
 [name] is a 73 y.o. 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   Hirsutism 10/11/2022   S/P panniculectomy 09/21/2020   Neck pain 12/20/2019   Colon cancer (HCC) 04/18/2018   Class 2 obesity with serious comorbidity and body mass index (BMI) of 38.0 to 38.9 in adult 07/27/2017   Vitamin D  deficiency 07/27/2017   Other fatigue 06/29/2017   Shortness of breath on exertion 06/29/2017   Class 2 obesity with serious comorbidity and body mass index (BMI) of 39.0 to 39.9 in adult 06/29/2017   Left hip pain 04/18/2017   Pre-diabetes 02/05/2016   Left thyroid  nodule 11/10/2014   TBI (traumatic brain injury) (HCC) 07/15/2014   Chest pain 12/13/2013   Dyslipidemia 12/13/2013   Obesity (BMI 35.0-39.9 without comorbidity) 12/13/2013   Hypertension 01/18/2012   GERD (gastroesophageal reflux disease) 01/18/2012   Depression 01/18/2012   Migraine syndrome 01/18/2012    Current Outpatient Medications  Medication Sig Dispense Refill   albuterol  (VENTOLIN  HFA) 108 (90 Base) MCG/ACT inhaler INHALE 2 PUFFS INTO THE LUNGS EVERY 6 HOURS AS NEEDED FOR WHEEZING OR SHORTNESS OF BREATH 54 g 1   amLODipine -valsartan  (EXFORGE ) 10-320 MG tablet Take 1 tablet by mouth daily. 90 tablet 1   cetirizine (ZYRTEC) 10 MG tablet Take 10 mg by mouth daily as needed for allergies.     Cholecalciferol (VITAMIN D3) 1.25 MG (50000 UT) CAPS Take 50,000 Units by mouth every Saturday.     desvenlafaxine  (PRISTIQ ) 100 MG 24 hr tablet Take 100 mg by mouth daily.     diclofenac  Sodium (VOLTAREN ) 1 % GEL Apply 1 Application topically 4 (four) times daily as needed (pain).     eplerenone  (INSPRA ) 50 MG tablet Take 1 tablet (50 mg total) by mouth daily. 90 tablet 3   ezetimibe  (ZETIA ) 10 MG tablet Take 1 tablet (10 mg total) by mouth daily. 90 tablet 3    Fluocinolone  Acetonide Body (DERMA-SMOOTHE /FS BODY) 0.01 % OIL Apply to scalp and leave on 8 hours or overnight as needed for eczema 118 mL 2   fluticasone  (FLONASE ) 50 MCG/ACT nasal spray Place 2 sprays into both nostrils daily. (Patient taking differently: Place 2 sprays into both nostrils daily as needed for allergies.) 16 g 0   frovatriptan (FROVA) 2.5 MG tablet Take 2.5 mg by mouth as needed for migraine. If recurs, may repeat after 2 hours. Max of 3 tabs in 24 hours.     gabapentin  (NEURONTIN ) 300 MG capsule Take 600-900 mg by mouth 3 (three) times daily as needed (headaches).     LORazepam  (ATIVAN ) 0.5 MG tablet Take 0.5 mg by mouth as needed. Pt say she take for MRI     naproxen sodium (ALEVE) 220 MG tablet Take 220 mg by mouth daily as needed (knee swelling).     OLANZapine (ZYPREXA) 5 MG tablet Take 5 mg by mouth at bedtime as needed (sleep).     omeprazole (PRILOSEC) 40 MG capsule Take 40 mg by mouth daily.     polyethylene glycol (MIRALAX  / GLYCOLAX ) packet Take 17 g by mouth daily.     Tetrahydrozoline HCl (MURINE FOR RED EYES OP) Place 1 drop into both eyes daily as needed (redness).     tolterodine  (DETROL  LA) 2 MG 24 hr capsule Take 1 capsule (2 mg total) by mouth daily. 90 capsule 0  valACYclovir  (VALTREX ) 1000 MG tablet TAKE 1 TABLET(1000 MG) BY MOUTH DAILY FOR SUPPRESSION 90 tablet 2   atorvastatin  (LIPITOR) 80 MG tablet Take 1 tablet (80 mg total) by mouth daily. 90 tablet 3   carvedilol  (COREG  CR) 40 MG 24 hr capsule TAKE 1 CAPSULE(40 MG) BY MOUTH DAILY 90 capsule 3   hydrALAZINE  (APRESOLINE ) 50 MG tablet Take 1 tablet (50 mg total) by mouth 3 (three) times daily. 270 tablet 3   No current facility-administered medications for this visit.    Allergies: Hydrochlorothiazide , Botox [botulinum toxin type a], Onabotulinumtoxina, Spironolactone , Toprol xl [metoprolol succinate], and Ubrelvy [ubrogepant]  Past Medical History:  Diagnosis Date   Allergy    Anxiety    Arthritis     Back pain    Blood transfusion without reported diagnosis    Cancer of sigmoid colon (HCC)    Constipation    resolved with Miralax    Depression    Dyspnea    pt denies at preop on 11/02/22   Fatigue    Fatty liver    mild   GERD (gastroesophageal reflux disease)    Grade I diastolic dysfunction    Echo 08/08/2017 with Grade I DD, EF 51%, Mild Aortic regurgitation, Mild Tricuspid Regurgitation, Pulmonary pressure 30 mm Hg. She denies any chest pain, dyspnea, or peripheral edema.   Headaches due to old head injury    History of kidney stones    History of stomach ulcers    Hyperaldosteronism (HCC)    Hyperlipidemia    Hypertension    Lactose intolerance    Left hip pain    Left thyroid  nodule    Lipoma of extremity 10/17/2017   Left lower   Migraines    Obesity    Panniculitis    Pneumonia    Pre-diabetes    TBI (traumatic brain injury) (HCC)    fell in 2008, some issues with short-term memory   Thyroid  disease    Vitamin D  deficiency     Past Surgical History:  Procedure Laterality Date   ABDOMINAL HYSTERECTOMY     ABDOMINOPLASTY/PANNICULECTOMY WITH LIPOSUCTION N/A 09/02/2020   Procedure: PANNICULECTOMY WITH LIPOSUCTION;  Surgeon: Thornell Flirt, DO;  Location: [facility];  Service: Plastics;  Laterality: N/A;  3 hours, please   BREAST LUMPECTOMY WITH RADIOACTIVE SEED LOCALIZATION Left 08/23/2016   Procedure: LEFT BREAST LUMPECTOMY WITH RADIOACTIVE SEED LOCALIZATION;  Surgeon: Enid Harry, MD;  Location: [facility];  Service: General;  Laterality: Left;   BREAST SURGERY  2003   reduction   CESAREAN SECTION     x2   CHALAZION EXCISION Left 2010   COLONOSCOPY W/ POLYPECTOMY  02/16/2018   Dr. Darcella Earnest JOINT INJECTION  2011 and 2012   FLEXIBLE SIGMOIDOSCOPY N/A 04/18/2018   Procedure: FLEXIBLE SIGMOIDOSCOPY;  Surgeon: Melvenia Stabs, MD;  Location: [facility];  Service: General;  Laterality: N/A;   LAPAROSCOPIC  SIGMOID COLECTOMY N/A 04/18/2018   Procedure: LAPAROSCOPIC ASSISTED SIGMOIDECTOMY;  Surgeon: Melvenia Stabs, MD;  Location: [facility];  Service: General;  Laterality: N/A;   MYOMECTOMY     MYOMECTOMY     x2   NM MYOCAR PERF WALL MOTION  03/2009   dipyridamole; normal pattern of perfusion in all regions, post-stress EF 65%, normal study    ROBOTIC ASSISTED LAPAROSCOPIC LYSIS OF ADHESION N/A 11/03/2022   Procedure: XI ROBOTIC ASSISTED LAPAROSCOPIC LYSIS OF ADHESION;  Surgeon: Suzi Essex, MD;  Location: [facility];  Service: Gynecology;  Laterality: N/A;   ROBOTIC ASSISTED SALPINGO OOPHERECTOMY Left 11/03/2022   Procedure: XI ROBOTIC ASSISTED UNILATERAL SALPINGO OOPHORECTOMY, CYSTOSCOPY;  Surgeon: Suzi Essex, MD;  Location: [facility];  Service: Gynecology;  Laterality: Left;   THYROIDECTOMY  1989   partial   TRANSTHORACIC ECHOCARDIOGRAM  03/2009   EF=>55%, borderline conc LVH; mild mitral annular calcif, trace MR; trace TR; AV mildly sclerotic, mild AV regurg; mild pulm valve regurg    TUBAL LIGATION      Family History  Problem Relation Age of Onset   Heart disease Mother        MI, HTN   Hypertension Mother    Thyroid  disease Mother    Hyperlipidemia Mother    Sudden death Mother    Anxiety disorder Mother    Glaucoma Father    Diabetes Father    Thyroid  disease Sister    Prostate cancer Brother    Heart disease Maternal Grandmother        MI, CVA   Heart disease Maternal Grandfather        MI   Stroke Maternal Grandfather    Heart disease Paternal Grandmother    Stroke Paternal Grandmother    Heart disease Paternal Grandfather        MI   Depression Daughter    ADD / ADHD Son    Heart attack Paternal Uncle        HTN   Hypertension Paternal Uncle    Breast cancer Cousin    Breast cancer Paternal Great-grandmother    Colon cancer Neg Hx    Stomach cancer Neg Hx    Esophageal cancer Neg Hx    Rectal cancer Neg Hx    Colon polyps Neg Hx    Ovarian cancer  Neg Hx    Endometrial cancer Neg Hx    Pancreatic cancer Neg Hx     Social History   Tobacco Use   Smoking status: Never   Smokeless tobacco: Never  Substance Use Topics   Alcohol use: Yes    Comment: occ    Subjective:  Presents with concerns for gout in her feet x 2 weeks; states that her feel have just been more swollen for the past 2 weeks; per patient, she has had issues with gout in the past but that presentation has always been in her toes; has been taking Aleve and tart cherry juice with limited relief; thinks last flare may occurred 2-3 years ago; note she has been drinking more non- alcoholic beer and eating more seafood lately;  Also concerned that she is wheezing more in the past 2 weeks but denies any chest pain or shortness of breath;   Objective:  Vitals:   04/02/24 0948  BP: 122/70  Pulse: (!) 55  SpO2: 98%  Weight: 223 lb 9.6 oz (101.4 kg)  Height: 5\' 3"  (1.6 m)    General: Well developed, well nourished, in no acute distress  Skin : Warm and dry.  Head: Normocephalic and atraumatic  Eyes: Sclera and conjunctiva clear; pupils round and reactive to light; extraocular movements intact  Ears: External normal; canals clear; tympanic membranes normal  Oropharynx: Pink, supple. No suspicious lesions  Neck: Supple without thyromegaly, adenopathy  Lungs: Respirations unlabored; clear to auscultation bilaterally without wheeze, rales, rhonchi  CVS exam: normal rate and regular rhythm.  Abdomen: Soft; nontender; nondistended; normoactive bowel sounds; no masses or hepatosplenomegaly  Musculoskeletal: No deformities; no active joint inflammation  Extremities:  Bilateral non-pitting edema, no cyanosis, no  clubbing  Vessels: Symmetric bilaterally  Neurologic: Alert and oriented; speech intact; face symmetrical; moves all extremities well; CNII-XII intact without focal deficit   Assessment:  1. Pedal edema   2. Arthralgia, unspecified joint   3. Pancreatic lesion   4.  Wheezing     Plan:  Patient is concerned her symptoms are gout but symptoms are more concerning for a cardiac issue; will update CXR today as well as labs; if uric acid level is up, will add short term prednisone  prescription; patient is not currently on any type of diuretic; may need to consider referral back to her cardiologist;  Reviewed recent notes from cardiology with patient- stressed the need to follow up with GI; I offered to update referral again but patient asked for the phone number so she could call and make her own appointment;  Patient has Rx for albuterol - need to get CXR results;   Will have patient plan to follow up with her PCP in 2 weeks to follow up on treatment plan/ symptoms;   No follow-ups on file.  Orders Placed This Encounter  Procedures   DG Chest 2 View    Standing Status:   Future    Number of Occurrences:   1    Expiration Date:   04/02/2025    Reason for Exam (SYMPTOM  OR DIAGNOSIS REQUIRED):   wheezing    Preferred imaging location?:   [facility]   CBC with Differential/Platelet   Comp Met (CMET)   B Nat Peptide   D-Dimer, Quantitative   Uric acid   Amylase   Lipase    Requested Prescriptions    No prescriptions requested or ordered in this encounter

## 2024-04-02 NOTE — Patient Instructions (Addendum)
 It is very important that you call the GI that Dr. Theodis Fiscal referred you to. You indicated today that you want to make your own appointment. Please call and get scheduled as soon as possible.  [phone]  Please get your labs today and then get your CXR today;

## 2024-04-03 ENCOUNTER — Other Ambulatory Visit: Payer: Self-pay | Admitting: Family

## 2024-04-03 ENCOUNTER — Ambulatory Visit: Admitting: Family Medicine

## 2024-04-03 DIAGNOSIS — R600 Localized edema: Secondary | ICD-10-CM

## 2024-04-03 LAB — D-DIMER, QUANTITATIVE: D-Dimer, Quant: 1.06 ug{FEU}/mL — ABNORMAL HIGH (ref <0.50)

## 2024-04-03 NOTE — Telephone Encounter (Signed)
 Pt notified, staff message to radiology scheduling sent.

## 2024-04-04 ENCOUNTER — Other Ambulatory Visit: Payer: Self-pay | Admitting: Family

## 2024-04-04 ENCOUNTER — Encounter: Payer: Self-pay | Admitting: Family

## 2024-04-04 ENCOUNTER — Ambulatory Visit (HOSPITAL_COMMUNITY)
Admission: RE | Admit: 2024-04-04 | Discharge: 2024-04-04 | Disposition: A | Discharge status: Home / Self Care | Source: Ambulatory Visit | Attending: Vascular Surgery | Admitting: Vascular Surgery

## 2024-04-04 ENCOUNTER — Telehealth: Payer: Self-pay

## 2024-04-04 DIAGNOSIS — R600 Localized edema: Secondary | ICD-10-CM

## 2024-04-04 NOTE — Telephone Encounter (Signed)
 FYI

## 2024-04-04 NOTE — Telephone Encounter (Signed)
 Copied from CRM [id number]. Topic: Clinical - Lab/Test Results >> Apr 04, 2024 11:30 AM Chuck Crater wrote: Reason for CRM: [name] with [facility] states that pt had a negative DVT exam in both legs.

## 2024-04-07 ENCOUNTER — Other Ambulatory Visit: Payer: Self-pay | Admitting: Family Medicine

## 2024-04-07 DIAGNOSIS — N3941 Urge incontinence: Secondary | ICD-10-CM

## 2024-04-08 ENCOUNTER — Telehealth (HOSPITAL_BASED_OUTPATIENT_CLINIC_OR_DEPARTMENT_OTHER): Payer: Self-pay | Admitting: Cardiovascular Disease

## 2024-04-08 ENCOUNTER — Ambulatory Visit: Admitting: Family Medicine

## 2024-04-08 DIAGNOSIS — R062 Wheezing: Secondary | ICD-10-CM

## 2024-04-08 NOTE — Telephone Encounter (Signed)
  Pt c/o swelling/edema: STAT if pt has developed SOB within 24 hours  If swelling, where is the swelling located? Legs and ankles   How much weight have you gained and in what time span?   Have you gained 2 pounds in a day or 5 pounds in a week? 10 lbs in 3 weeks   Do you have a log of your daily weights (if so, list)?   Are you currently taking a fluid pill? No   Are you currently SOB? Some SOB in exertion   Have you traveled recently in a car or plane for an extended period of time? No   Pt said, she recently had lab work and chest xray and her pcp said to call Dr. Theodis Fiscal because she has some fluid build up around her heart

## 2024-04-08 NOTE — Telephone Encounter (Signed)
 Spoke with patient regarding swelling Swelling started about 2 and 1/2 weeks ago  Thought may be gout  shortness of breath with exertion, heaviness in chest when exercises  Can hear wheezing when breathing  Swelling goes down some at night Does not keep legs elevated  Saw PCP 5/6 who did labs and xray  Was told by PCP to follow up with cardiology  Offered her appointment tomorrow with Dr Maximo Spar however she has to take her grandchild to hospital for a procedure  To prevent or reduce lower extremity swelling: Eat a low salt diet Sit with legs elevated Wear knee-high compression stockings during the daytime.   Above reviewed with patient   Have sent message to scheduling to put patient on cancellation list and reach out to get appointment scheduled   Will forward to Dr Maximo Spar for review

## 2024-04-09 ENCOUNTER — Encounter (HOSPITAL_BASED_OUTPATIENT_CLINIC_OR_DEPARTMENT_OTHER): Payer: Self-pay | Admitting: Internal Medicine

## 2024-04-09 ENCOUNTER — Ambulatory Visit (HOSPITAL_BASED_OUTPATIENT_CLINIC_OR_DEPARTMENT_OTHER): Admitting: Internal Medicine

## 2024-04-09 VITALS — BP 142/80 | HR 74 | Ht 63.0 in | Wt 219.0 lb

## 2024-04-09 DIAGNOSIS — I10 Essential (primary) hypertension: Secondary | ICD-10-CM

## 2024-04-09 DIAGNOSIS — Z8739 Personal history of other diseases of the musculoskeletal system and connective tissue: Secondary | ICD-10-CM

## 2024-04-09 DIAGNOSIS — Z01812 Encounter for preprocedural laboratory examination: Secondary | ICD-10-CM

## 2024-04-09 DIAGNOSIS — R600 Localized edema: Secondary | ICD-10-CM | POA: Diagnosis not present

## 2024-04-09 DIAGNOSIS — R0789 Other chest pain: Secondary | ICD-10-CM

## 2024-04-09 MED ORDER — HYDROCHLOROTHIAZIDE 12.5 MG PO CAPS: 12.5000 mg | ORAL_CAPSULE | Freq: Every day | ORAL | 3 refills | Status: AC

## 2024-04-09 MED ORDER — METOPROLOL TARTRATE 100 MG PO TABS: 100.0000 mg | ORAL_TABLET | ORAL | 0 refills | Status: DC

## 2024-04-09 NOTE — Progress Notes (Signed)
 OFFICE NOTE  Chief Complaint:  Leg swelling, chest pressure and dyspnea  Primary Care Physician: Kaylee Partridge, MD  HPI:  [name] is a pleasant 73 year old female with a strong family history of heart disease. Her mother died of a sudden heart attack at age 59, her both grandparents had heart disease and a grandmother had a stroke at age 57. She was seen remotely by cardiologist more than 3 years ago and had stress testing at that time which was negative. Back in November she was complaining of chest pain which was intermittent. It was somewhat worse at that time however she had an active bronchitis and was treated. Her symptoms improved somewhat however she still had some chest pain since that time. Some of it is associated with exertion and relieved by rest and other times it comes fairly randomly. It does feel like a squeezing sensation in the upper chest it does not radiate to the neck, arm or back. She describes the discomfort as a 5 or 6/10 in severity.  She occasionally takes nitroglycerin  for this however her old prescription was only recently renewed.  She is desiring to start an exercise program and would like cardiovascular evaluation and risk stratification prior to that.  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  [name] had a recent nuclear stress test which was negative.  This demonstrated no reversible ischemia. She also had a lipid NMR performed, which demonstrated elevated LDL cholesterol of 117, with elevated LDL particle numbers to 1347.  Her contents a metabolic profile was normal.  Her blood pressure today is much improved. She denies any further chest pain and only occasionally has migraine headaches. We have not assessed her cholesterol since her last lipid profile  and she's not on treatment for her elevated cholesterol.  I saw [name] back in the office today. Her main complaint is some discomfort over the right trapezius area. She says this is usually worse with stress and tension. She denies any chest pain per se today. Recently her headaches have improved somewhat. Her cholesterol has been fairly well-controlled. Her blood pressure is at goal today. Unfortunate she's not managed to lose much weight and remains close to morbid obesity with BMI of 38.  08/02/2016  Returns today for follow-up. Over the past year she's had no complaints. Recently she was found to have a breast mass and is undergoing a biopsy later this month. She's not had a recent cholesterol check in the past year but had a fairly good cholesterol profile last saw her. She is continue to work on weight loss. She's on medication for depression which she says is working well. Her only issue right now is memory loss.  08/03/2017  [name] was seen today in follow-up. She has a migraine today. Overall though her migraines have improved some from her history of traumatic brain injury. She's taking less medication for that. She is more committed to primary prevention. Her lipid profile has improved some with weight loss recently [facility] Dr[facility] Alvia Awkward. She is optimistic about further weight loss which should be helpful for her risk reduction. Blood pressure is elevated today however possibly attributable to her migraine. Her last lipid profile showed an LDL of 119. She's not  on a statin and is a borderline diabetic on metformin . She has a significant family history of early onset coronary disease. She denies chest pain or shortness of breath with exertion.  09/14/2018  [name] is seen today in annual follow-up.  Unfortunately she recently was diagnosed with colon cancer.  She underwent surgical resection and is deemed cured of it.   She does have follow-up in a year.  She did not require chemotherapy or radiation.  She was working on weight loss and was seen Dr. Alvia Awkward however after colon surgery she could not tolerate the diet she was supposed to be on.  She had lost weight but then gained it back.  Overall she is feeling well.  Her blood sugars were borderline and she remained on metformin .  We discussed the importance of lowering her cholesterol which have been elevated with LDL at 130.  Earlier this year her LDL had come down to 118 with weight loss and dietary changes however she does not think that she will be able to make the significant dietary changes she needs to get it lower.  10/04/2019  [name] returns today for follow-up.  Overall she is under quite a bit of stress.  She is noted her blood pressures have been high recently, even higher than it was today 163/85.  She occasionally gets some right-sided chest discomfort which is worse when she is stressed.  She does not note any symptoms noticed necessarily when walking or exerting herself.  She does like to walk for exercise.  She can walk quite a distance without any leg pain but occasionally gets shooting pains in her legs.  She also has noted some numbness and tingling in her feet.  She is on gabapentin  which she takes for headaches but has no diagnosis of neuropathy.  She is free of colon cancer now more than a year out.  CT of the chest for follow-up of cancer did not demonstrate any evidence of coronary calcification that I saw.  Recent lipid showed total cholesterol 140, triglycerides 82, HDL 50 and LDL 74.  06/24/2020  [name] returns today for follow-up.  Blood pressures well controlled today.  She denies any chest pain or worsening shortness of breath.  EKG shows a sinus bradycardia.  Labs from September 2020 showed an LDL 74 hemoglobin A1c 5.7.  06/21/2023  [name] is seen today in follow-up.  She reports her blood pressures have been  trending upwards.  She was seen earlier this year by one of our nurse practitioners who made some adjustments in her meds.  She is currently on 4 blood pressure medicines although blood pressure today was 140/88.  She recently had a lot of stress as her father passed away.  That has improved however she is concerned about persistently elevated blood pressures.  She denies any chest pain or shortness of breath.  12/22/2023  [name] returns today for follow-up.  Overall she says she feels significantly better.  She was diagnosed with sleep apnea and had mild to moderate obstructive sleep apnea.  She is now on CPAP and she says she has been very compliant with it and her energy level has improved.  Her blood pressure is also much better and now at goal at 122/78.  She was found to have some degree of hyperaldosteronism and is now on eplerenone  since she had side effects with spironolactone .  Overall her blood pressure control is much better.  She has not had recent lipid  assessment since 2023 although cholesterol had been well-controlled with an LDL of 71 at the time.  04/09/2024  [name] is seen today for an acute visit.  She reports recently she has been having some leg swelling, chest pressure and shortness of breath.  She saw her PCP last week and underwent a number of tests including D-dimer which was mildly abnormal but her lower extremity Dopplers were negative for DVT.  She had a chest x-ray which was normal.  She had a BNP which was low.  Labs otherwise unremarkable.  Her symptoms seem to be little worse with exertion and improved somewhat with rest.  She has had some swelling as well.  She is on a number of blood pressure medications although blood pressure still not at target.  PMHx:  Past Medical History:  Diagnosis Date   Allergy    Anxiety    Arthritis    Back pain    Blood transfusion without reported diagnosis    Cancer of sigmoid colon (HCC)    Constipation    resolved  with Miralax    Depression    Dyspnea    pt denies at preop on 11/02/22   Fatigue    Fatty liver    mild   GERD (gastroesophageal reflux disease)    Grade I diastolic dysfunction    Echo 08/08/2017 with Grade I DD, EF 51%, Mild Aortic regurgitation, Mild Tricuspid Regurgitation, Pulmonary pressure 30 mm Hg. She denies any chest pain, dyspnea, or peripheral edema.   Headaches due to old head injury    History of kidney stones    History of stomach ulcers    Hyperaldosteronism (HCC)    Hyperlipidemia    Hypertension    Lactose intolerance    Left hip pain    Left thyroid  nodule    Lipoma of extremity 10/17/2017   Left lower   Migraines    Obesity    Panniculitis    Pneumonia    Pre-diabetes    TBI (traumatic brain injury) (HCC)    fell in 2008, some issues with short-term memory   Thyroid  disease    Vitamin D  deficiency     Past Surgical History:  Procedure Laterality Date   ABDOMINAL HYSTERECTOMY     ABDOMINOPLASTY/PANNICULECTOMY WITH LIPOSUCTION N/A 09/02/2020   Procedure: PANNICULECTOMY WITH LIPOSUCTION;  Surgeon: Thornell Flirt, DO;  Location: [facility];  Service: Plastics;  Laterality: N/A;  3 hours, please   BREAST LUMPECTOMY WITH RADIOACTIVE SEED LOCALIZATION Left 08/23/2016   Procedure: LEFT BREAST LUMPECTOMY WITH RADIOACTIVE SEED LOCALIZATION;  Surgeon: Enid Harry, MD;  Location: [facility];  Service: General;  Laterality: Left;   BREAST SURGERY  2003   reduction   CESAREAN SECTION     x2   CHALAZION EXCISION Left 2010   COLONOSCOPY W/ POLYPECTOMY  02/16/2018   Dr. Darcella Earnest JOINT INJECTION  2011 and 2012   FLEXIBLE SIGMOIDOSCOPY N/A 04/18/2018   Procedure: FLEXIBLE SIGMOIDOSCOPY;  Surgeon: Melvenia Stabs, MD;  Location: [facility];  Service: General;  Laterality: N/A;   LAPAROSCOPIC SIGMOID COLECTOMY N/A 04/18/2018   Procedure: LAPAROSCOPIC ASSISTED SIGMOIDECTOMY;  Surgeon: Melvenia Stabs, MD;  Location:  [facility];  Service: General;  Laterality: N/A;   MYOMECTOMY     MYOMECTOMY     x2   NM MYOCAR PERF WALL MOTION  03/2009   dipyridamole; normal pattern of perfusion in all regions, post-stress EF 65%, normal study    ROBOTIC  ASSISTED LAPAROSCOPIC LYSIS OF ADHESION N/A 11/03/2022   Procedure: XI ROBOTIC ASSISTED LAPAROSCOPIC LYSIS OF ADHESION;  Surgeon: Suzi Essex, MD;  Location: [facility];  Service: Gynecology;  Laterality: N/A;   ROBOTIC ASSISTED SALPINGO OOPHERECTOMY Left 11/03/2022   Procedure: XI ROBOTIC ASSISTED UNILATERAL SALPINGO OOPHORECTOMY, CYSTOSCOPY;  Surgeon: Suzi Essex, MD;  Location: [facility];  Service: Gynecology;  Laterality: Left;   THYROIDECTOMY  1989   partial   TRANSTHORACIC ECHOCARDIOGRAM  03/2009   EF=>55%, borderline conc LVH; mild mitral annular calcif, trace MR; trace TR; AV mildly sclerotic, mild AV regurg; mild pulm valve regurg    TUBAL LIGATION      FAMHx:  Family History  Problem Relation Age of Onset   Heart disease Mother        MI, HTN   Hypertension Mother    Thyroid  disease Mother    Hyperlipidemia Mother    Sudden death Mother    Anxiety disorder Mother    Glaucoma Father    Diabetes Father    Thyroid  disease Sister    Prostate cancer Brother    Heart disease Maternal Grandmother        MI, CVA   Heart disease Maternal Grandfather        MI   Stroke Maternal Grandfather    Heart disease Paternal Grandmother    Stroke Paternal Grandmother    Heart disease Paternal Grandfather        MI   Depression Daughter    ADD / ADHD Son    Heart attack Paternal Uncle        HTN   Hypertension Paternal Uncle    Breast cancer Cousin    Breast cancer Paternal Great-grandmother    Colon cancer Neg Hx    Stomach cancer Neg Hx    Esophageal cancer Neg Hx    Rectal cancer Neg Hx    Colon polyps Neg Hx    Ovarian cancer Neg Hx    Endometrial cancer Neg Hx    Pancreatic cancer Neg Hx     SOCHx:   reports that she has never smoked. She  has never used smokeless tobacco. She reports current alcohol use. She reports that she does not use drugs.  ALLERGIES:  Allergies  Allergen Reactions   Hydrochlorothiazide  Other (See Comments)    Increases risk of gout flares    Botox [Botulinum Toxin Type A] Swelling   Onabotulinumtoxina Swelling   Spironolactone      Increased hair growth on chin/back   Toprol Xl [Metoprolol Succinate] Hives   Ubrelvy [Ubrogepant] Hives    ROS: Pertinent items noted in HPI and remainder of comprehensive ROS otherwise negative.  HOME MEDS: Current Outpatient Medications  Medication Sig Dispense Refill   albuterol  (VENTOLIN  HFA) 108 (90 Base) MCG/ACT inhaler INHALE 2 PUFFS INTO THE LUNGS EVERY 6 HOURS AS NEEDED FOR WHEEZING OR SHORTNESS OF BREATH 54 g 1   amLODipine -valsartan  (EXFORGE ) 10-320 MG tablet Take 1 tablet by mouth daily. 90 tablet 1   atorvastatin  (LIPITOR) 80 MG tablet Take 1 tablet (80 mg total) by mouth daily. 90 tablet 3   carvedilol  (COREG  CR) 40 MG 24 hr capsule TAKE 1 CAPSULE(40 MG) BY MOUTH DAILY 90 capsule 3   cetirizine (ZYRTEC) 10 MG tablet Take 10 mg by mouth daily as needed for allergies.     Cholecalciferol (VITAMIN D3) 1.25 MG (50000 UT) CAPS Take 50,000 Units by mouth every Saturday.     desvenlafaxine  (PRISTIQ ) 100 MG 24 hr  tablet Take 100 mg by mouth daily.     diclofenac  Sodium (VOLTAREN ) 1 % GEL Apply 1 Application topically 4 (four) times daily as needed (pain).     eplerenone  (INSPRA ) 50 MG tablet Take 1 tablet (50 mg total) by mouth daily. 90 tablet 3   ezetimibe  (ZETIA ) 10 MG tablet Take 1 tablet (10 mg total) by mouth daily. 90 tablet 3   Fluocinolone  Acetonide Body (DERMA-SMOOTHE /FS BODY) 0.01 % OIL Apply to scalp and leave on 8 hours or overnight as needed for eczema 118 mL 2   fluticasone  (FLONASE ) 50 MCG/ACT nasal spray Place 2 sprays into both nostrils daily. (Patient taking differently: Place 2 sprays into both nostrils daily as needed for allergies.) 16 g 0    frovatriptan (FROVA) 2.5 MG tablet Take 2.5 mg by mouth as needed for migraine. If recurs, may repeat after 2 hours. Max of 3 tabs in 24 hours.     gabapentin  (NEURONTIN ) 300 MG capsule Take 600-900 mg by mouth 3 (three) times daily as needed (headaches).     hydrALAZINE  (APRESOLINE ) 50 MG tablet Take 1 tablet (50 mg total) by mouth 3 (three) times daily. 270 tablet 3   LORazepam  (ATIVAN ) 0.5 MG tablet Take 0.5 mg by mouth as needed. Pt say she take for MRI     naproxen sodium (ALEVE) 220 MG tablet Take 220 mg by mouth daily as needed (knee swelling).     OLANZapine (ZYPREXA) 5 MG tablet Take 5 mg by mouth at bedtime as needed (sleep).     omeprazole (PRILOSEC) 40 MG capsule Take 40 mg by mouth daily.     polyethylene glycol (MIRALAX  / GLYCOLAX ) packet Take 17 g by mouth daily.     Tetrahydrozoline HCl (MURINE FOR RED EYES OP) Place 1 drop into both eyes daily as needed (redness).     tolterodine  (DETROL  LA) 2 MG 24 hr capsule TAKE 1 CAPSULE(2 MG) BY MOUTH DAILY 90 capsule 0   valACYclovir  (VALTREX ) 1000 MG tablet TAKE 1 TABLET(1000 MG) BY MOUTH DAILY FOR SUPPRESSION 90 tablet 2   No current facility-administered medications for this visit.    LABS/IMAGING: No results found for this or any previous visit (from the past 48 hours). No results found.  VITALS: BP (!) 142/80 (BP Location: Left Arm, Patient Position: Sitting, Cuff Size: Large)   Pulse 74   Ht 5\' 3"  (1.6 m)   Wt 219 lb (99.3 kg)   SpO2 94%   BMI 38.79 kg/m   EXAM: General appearance: alert and no distress Lungs: clear to auscultation bilaterally Heart: regular rate and rhythm Extremities: edema trace to 1+ bilateral lower extremity Neurologic: Grossly normal   EKG: EKG Interpretation Date/Time:  Tuesday Apr 09 2024 13:41:09 EDT Ventricular Rate:  71 PR Interval:  152 QRS Duration:  80 QT Interval:  372 QTC Calculation: 404 R Axis:   -31  Text Interpretation: Normal sinus rhythm Left axis deviation Minimal  voltage criteria for LVH, may be normal variant ( R in aVL ) Possible Anterolateral infarct (cited on or before 02-Nov-2022) When compared with ECG of 22-Dec-2023 15:41, Nonspecific T wave abnormality has replaced inverted T waves in Inferior leads Confirmed by Dinah Franco [phone]) on 04/09/2024 1:44:59 PM    ASSESSMENT: Chest pressure, dyspnea and lower extremity edema Dyslipidemia, goal LDL less than 70 Hypertension/hyperaldosteronism Obesity - [facility]  History of TBI with headaches and memory loss Strong family history of premature coronary artery disease Type 2  diabetes Recent colon cancer status post resection OSA on CPAP   PLAN: 1.   Mrs. Hanselman has been having chest pressure, this leg swelling.  BNP is negative, chest x-ray unremarkable.  Her edema might be a side effect of her blood pressure medications.  She previously been on some hydrochlorothiazide  however she says however she has been eating less red meats and drinking less and feels that her gout has been quiescent.  I like to start her back on a little bit of HCTZ if this helps her swelling.  It may also help her blood pressure which is not controlled at 142/80.  In addition I am concerned that this could be coronary ischemia.  She has not had a stress test or coronary evaluation since 2015 and has a strong family history of premature coronary disease.  Will plan a coronary CT angiogram.  She has reportedly had hives with Toprol-XL but takes carvedilol  without issues.  It may be the long-acting formulation.  Will try metoprolol to tartrate prior to the procedure for better heart rate control.  I will contact her with results of this study.  She has a follow-up scheduled with Dr. Theodis Fiscal already on June 18.  Hazle Lites, MD, [facility], FNLA, FACP  [place]  [facility]  Medical Director of the Advanced Lipid Disorders &  Cardiovascular Risk Reduction Clinic Diplomate of the  American Board of Clinical Lipidology Attending Cardiologist  Direct Dial: [phone]  Fax: [phone]  Website:  [email or web address] Markese Bloxham 04/09/2024, 1:45 PM

## 2024-04-09 NOTE — Telephone Encounter (Signed)
 Left message to call back re: appt  Opening at 1:30pm today at [facility] held

## 2024-04-09 NOTE — Patient Instructions (Signed)
 Medication Instructions:  START hydrochlorothiazide  12.5mg  once daily -- hold the day of CT test  TAKE ONE-TIME dose of metoprolol tartrate 100mg  -- two hours before CT test  *If you need a refill on your cardiac medications before your next appointment, please call your pharmacy*  Lab Work: Non-Fasting lab work 5/20-5/23 BMET, Uric Acid  If you have labs (blood work) drawn today and your tests are completely normal, you will receive your results only by: MyChart Message (if you have MyChart) OR A paper copy in the mail If you have any lab test that is abnormal or we need to change your treatment, we will call you to review the results.  Testing/Procedures: Coronary CTA - you will get a call to schedule this test once approved with insurance  Follow-Up: At [facility], you and your health needs are our priority.  As part of our continuing mission to provide you with exceptional heart care, our providers are all part of one team.  This team includes your primary Cardiologist (physician) and Advanced Practice Providers or APPs (Physician Assistants and Nurse Practitioners) who all work together to provide you with the care you need, when you need it.  Your next appointment:   AS SCHEDULED with Dr Theodis Fiscal  We recommend signing up for the patient portal called "MyChart".  Sign up information is provided on this After Visit Summary.  MyChart is used to connect with patients for Virtual Visits (Telemedicine).  Patients are able to view lab/test results, encounter notes, upcoming appointments, etc.  Non-urgent messages can be sent to your provider as well.   To learn more about what you can do with MyChart, go to [email or web address].   Other Instructions   Your cardiac CT will be scheduled at one of the below locations:   [facility] [address] [place], [place] [address] [phone]  OR  [facility] [address] [place], [place] [address] [phone]  OR   [facility] [address] [place], [place] [address] [phone]  OR   [facility] [address] [place], [place] [address] [phone]  OR   [facility] and Vascular Tower [address]  [place], [place] [address] Opening March 25, 2024  If scheduled at [facility], please arrive at the [facility] and Children's Entrance (Entrance C2) of [facility] 30 minutes prior to test start time. You can use the FREE valet parking offered at entrance C (encouraged to control the heart rate for the test)  Proceed to the [facility] Radiology Department (first floor) to check-in and test prep.   All radiology patients and guests should use entrance C2 at [facility], accessed from [facility], even though the hospital's physical address listed is [address].    If scheduled at the Heart and Vascular Tower at [address], please enter the parking lot using the [address] entrance and use the FREE valet service at the patient drop-off area. Enter the buidling and check-in with registration on the main floor.  If scheduled at [facility] or [facility], please arrive 15 mins early for check-in and test prep.  There is spacious parking and easy access to the radiology department from the [facility] Heart and Vascular entrance. Please enter here and check-in with the desk attendant.   If scheduled at [facility], please arrive 30 minutes  early for check-in and test prep.  Please follow these instructions carefully (unless otherwise directed):  An IV will be required for this test and Nitroglycerin  will be given.  Hold all erectile dysfunction medications at least 3 days (72 hrs) prior to test. (Ie viagra, cialis, sildenafil, tadalafil, etc)   On the Night Before the Test: Be  sure to Drink plenty of water . Do not consume any caffeinated/decaffeinated beverages or chocolate 12 hours prior to your test. Do not take any antihistamines 12 hours prior to your test.  On the Day of the Test: Drink plenty of water  until 1 hour prior to the test. Do not eat any food 1 hour prior to test. You may take your regular medications prior to the test.  Take metoprolol (Lopressor) two hours prior to test. If you take Furosemide/Hydrochlorothiazide /Spironolactone /Chlorthalidone, please HOLD on the morning of the test. Patients who wear a continuous glucose monitor MUST remove the device prior to scanning. FEMALES- please wear underwire-free bra if available, avoid dresses & tight clothing  After the Test: Drink plenty of water . After receiving IV contrast, you may experience a mild flushed feeling. This is normal. On occasion, you may experience a mild rash up to 24 hours after the test. This is not dangerous. If this occurs, you can take Benadryl  25 mg, Zyrtec, Claritin, or Allegra and increase your fluid intake. (Patients taking Tikosyn should avoid Benadryl , and may take Zyrtec, Claritin, or Allegra) If you experience trouble breathing, this can be serious. If it is severe call 911 IMMEDIATELY. If it is mild, please call our office.  We will call to schedule your test 2-4 weeks out understanding that some insurance companies will need an authorization prior to the service being performed.   For more information and frequently asked questions, please visit our website : [email or web address]  For non-scheduling related questions, please contact the cardiac imaging nurse navigator should you have any questions/concerns: Cardiac Imaging Nurse Navigators Direct Office Dial: [phone]   For scheduling needs, including cancellations and rescheduling, please call [place], [phone].

## 2024-04-09 NOTE — Telephone Encounter (Signed)
 Patient scheduled 04/09/24 @ 1:30pm with Dr. Maximo Spar

## 2024-04-10 ENCOUNTER — Ambulatory Visit (HOSPITAL_BASED_OUTPATIENT_CLINIC_OR_DEPARTMENT_OTHER)
Admission: RE | Admit: 2024-04-10 | Discharge: 2024-04-10 | Disposition: A | Discharge status: Home / Self Care | Source: Ambulatory Visit | Attending: Cardiovascular Disease | Admitting: Cardiovascular Disease

## 2024-04-10 ENCOUNTER — Other Ambulatory Visit: Payer: Self-pay | Admitting: Cardiovascular Disease

## 2024-04-10 ENCOUNTER — Encounter: Payer: Self-pay | Admitting: Cardiovascular Disease

## 2024-04-10 DIAGNOSIS — K862 Cyst of pancreas: Secondary | ICD-10-CM | POA: Diagnosis present

## 2024-04-10 DIAGNOSIS — Z85038 Personal history of other malignant neoplasm of large intestine: Secondary | ICD-10-CM

## 2024-04-10 MED ORDER — GADOBUTROL 1 MMOL/ML IV SOLN
10.0000 mL | Freq: Once | INTRAVENOUS | Status: AC | PRN
  Administered 2024-04-10: 10 mL via INTRAVENOUS

## 2024-04-10 MED ORDER — ALBUTEROL SULFATE HFA 108 (90 BASE) MCG/ACT IN AERS: 2.0000 | INHALATION_SPRAY | Freq: Four times a day (QID) | RESPIRATORY_TRACT | 1 refills | Status: AC | PRN

## 2024-04-10 NOTE — Addendum Note (Signed)
 Addended by: Rayven Hendrickson E on: 04/10/2024 04:03 PM   Modules accepted: Orders

## 2024-04-10 NOTE — Telephone Encounter (Signed)
 Copied from CRM [id number]. Topic: Clinical - Medication Refill >> Apr 10, 2024  3:23 PM Rosheka N wrote: Medication: albuterol  (VENTOLIN  HFA) 108 (90 Base) MCG/ACT inhaler, albuterol  (VENTOLIN  HFA) 108 (90 Base) MCG/ACT inhaler  Has the patient contacted their pharmacy? Yes (Agent: If no, request that the patient contact the pharmacy for the refill. If patient does not wish to contact the pharmacy document the reason why and proceed with request.) (Agent: If yes, when and what did the pharmacy advise?)  This is the patient's preferred pharmacy:  [id number] Jonette Nestle, [place] - [address] AT [facility] & [address] [place] [address] Phone: [phone] Fax: [phone]  Patient would like this prescription sent today    Is this the correct pharmacy for this prescription? Yes If no, delete pharmacy and type the correct one.   Has the prescription been filled recently? No  Is the patient out of the medication? Yes  Has the patient been seen for an appointment in the last year OR does the patient have an upcoming appointment? Yes  Can we respond through MyChart? No  Agent: Please be advised that Rx refills may take up to 3 business days. We ask that you follow-up with your pharmacy.

## 2024-04-10 NOTE — Addendum Note (Signed)
 Addended by: Berda Shelvin D on: 04/10/2024 04:05 PM   Modules accepted: Orders

## 2024-04-15 ENCOUNTER — Ambulatory Visit (HOSPITAL_BASED_OUTPATIENT_CLINIC_OR_DEPARTMENT_OTHER): Payer: Self-pay | Admitting: Cardiovascular Disease

## 2024-04-16 ENCOUNTER — Other Ambulatory Visit: Payer: Self-pay | Admitting: Family Medicine

## 2024-04-16 DIAGNOSIS — K869 Disease of pancreas, unspecified: Secondary | ICD-10-CM

## 2024-04-18 ENCOUNTER — Telehealth: Payer: Self-pay

## 2024-04-18 NOTE — Telephone Encounter (Signed)
 New patient appt set up with GM on 06/12/24 at 1050 am   Recall has been changed to GM   The pt has been advised and agrees to the appt

## 2024-04-18 NOTE — Telephone Encounter (Signed)
-----   Message from [facility] sent at 04/18/2024  4:14 AM EDT ----- Sharmaine Dearth, Thanks for reaching out. 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 I will have my team look for that referral but will reach out to the patient, she used to see Dr. Howard Macho. I will set her up a clinic visit with me to discuss if she wants to do high risk surveillance of this versus moving forward with an EUS before setting up surveillance. Thanks. GM  Adalei Novell, This patient needs a clinic visit with me (okay to use overbook or held slot or 3:50 PM slot if needed) to discuss her pancreatic cyst and whether high risk surveillance with imaging versus a EUS aspiration should be pursued. You can go ahead and move her colonoscopy recall for 2028 to my name as well. Thanks. GM ----- Message ----- From: Kaylee Partridge, MD Sent: 04/16/2024   1:37 PM EDT To: Normie Becton., MD  [place]- this is Jess Copland with [facility].  Are you the right person to evaluate this pancreatic finding?  IMPRESSION: 1. Unchanged fluid signal cystic lesion arising from the inferior margin of the pancreatic body measuring 2.1 x 1.9 cm. This communicates with the adjacent main pancreatic duct MR. No solid component or suspicious contrast enhancement. Additional tiny fluid signal cysts scattered in the pancreatic tail. 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 Although in general these lesions are exceedingly benign, given rapid growth of the largest lesion, consider EUS/FNA for definitive diagnosis, or alternately continued MR surveillance in 6 months to 1 year. 2. No acute findings of the abdomen.   I wanted to make sure I had the right person for this job!  I placed a referral for her to see you but  let me know if you suggest a different course of action  Thank you!  Jess

## 2024-04-19 ENCOUNTER — Ambulatory Visit (HOSPITAL_BASED_OUTPATIENT_CLINIC_OR_DEPARTMENT_OTHER): Payer: Self-pay | Admitting: Internal Medicine

## 2024-04-19 LAB — BASIC METABOLIC PANEL WITH GFR
BUN/Creatinine Ratio: 19 (ref 12–28)
BUN: 14 mg/dL (ref 8–27)
CO2: 24 mmol/L (ref 20–29)
Calcium: 9.5 mg/dL (ref 8.7–10.3)
Chloride: 106 mmol/L (ref 96–106)
Creatinine, Ser: 0.74 mg/dL (ref 0.57–1.00)
Glucose: 101 mg/dL — ABNORMAL HIGH (ref 70–99)
Potassium: 3.7 mmol/L (ref 3.5–5.2)
Sodium: 144 mmol/L (ref 134–144)
eGFR: 86 mL/min/{1.73_m2} (ref >59)

## 2024-04-19 LAB — URIC ACID: Uric Acid: 5.3 mg/dL (ref 3.1–7.9)

## 2024-04-26 ENCOUNTER — Encounter (HOSPITAL_COMMUNITY): Payer: Self-pay

## 2024-04-29 ENCOUNTER — Ambulatory Visit (HOSPITAL_COMMUNITY)
Admission: RE | Admit: 2024-04-29 | Discharge: 2024-04-29 | Disposition: A | Discharge status: Home / Self Care | Source: Ambulatory Visit | Attending: Cardiovascular Disease | Admitting: Cardiovascular Disease

## 2024-04-29 DIAGNOSIS — Q2112 Patent foramen ovale: Secondary | ICD-10-CM

## 2024-04-29 DIAGNOSIS — R0789 Other chest pain: Secondary | ICD-10-CM | POA: Diagnosis present

## 2024-04-29 MED ORDER — NITROGLYCERIN 0.4 MG SL SUBL
SUBLINGUAL_TABLET | SUBLINGUAL | Status: AC
  Filled 2024-04-29: qty 2

## 2024-04-29 MED ORDER — IOHEXOL 350 MG/ML SOLN
100.0000 mL | Freq: Once | INTRAVENOUS | Status: AC | PRN
  Administered 2024-04-29: 100 mL via INTRAVENOUS

## 2024-04-29 MED ORDER — NITROGLYCERIN 0.4 MG SL SUBL
0.8000 mg | SUBLINGUAL_TABLET | Freq: Once | SUBLINGUAL | Status: AC
  Administered 2024-04-29: 0.8 mg via SUBLINGUAL

## 2024-05-15 ENCOUNTER — Encounter (HOSPITAL_BASED_OUTPATIENT_CLINIC_OR_DEPARTMENT_OTHER): Payer: Self-pay | Admitting: Cardiovascular Disease

## 2024-05-15 ENCOUNTER — Ambulatory Visit (HOSPITAL_BASED_OUTPATIENT_CLINIC_OR_DEPARTMENT_OTHER): Admitting: Cardiovascular Disease

## 2024-05-15 VITALS — BP 127/74 | HR 64 | Wt 218.4 lb

## 2024-05-15 DIAGNOSIS — K8689 Other specified diseases of pancreas: Secondary | ICD-10-CM

## 2024-05-15 DIAGNOSIS — K862 Cyst of pancreas: Secondary | ICD-10-CM

## 2024-05-15 DIAGNOSIS — I10 Essential (primary) hypertension: Secondary | ICD-10-CM | POA: Diagnosis not present

## 2024-05-15 NOTE — Patient Instructions (Signed)
 Medication Instructions:  Your physician recommends that you continue on your current medications as directed. Please refer to the Current Medication list given to you today.   Labwork: CA 19-9  Testing/Procedures: NONE  Follow-Up: 6 MONTHS WITH DR Nino Bass NP, OR MICHELLE S NP   You have been referred to GASTROENTEROLOGY  IF YOU DO NOT HEAR FROM THEM IN 2 WEEKS YOU CAN CALL THE AT THE NUMBER HIGHLIGHTED   YOU HAVE BEEN REFERRED TO THE PREP PROGRAM   If you need a refill on your cardiac medications before your next appointment, please call your pharmacy.

## 2024-05-15 NOTE — Progress Notes (Signed)
 Hypertension Clinic Follow Up:    Date:  05/15/2024   ID:  [name], DOB [birth year], MRN [id number]  PCP:  Shelby Partridge, MD  Cardiologist:  Shelby Lites, MD   Referring MD: Shelby Partridge, MD   CC: Hypertension  History of Present Illness:    [name] is a 73 y.o. female with a hx of hyperlipidemia, hypertension, hyperaldosteronism, SVT, prediabetes, OSA on CPAP, chronic migraines, colon cancer status post surgical resection, pancreatic cyst, and surgical thyroidectomy here for follow-up.  She first establish care in the advanced hypertension clinic with Shelby Banks, NP on 07/2023.  She is requesting for help and blood pressures remain uncontrolled on this.  Spironolactone , valsartan , carvedilol , and amlodipine .  She saw [name] and hydralazine  was increased.  Renal Dopplers were negative for renal artery stenosis.  Consistent with hyperaldosteronism.  Has not tolerated eplerenone .  Prior CT of the abdomen without contrast 08/2022 revealed normal adrenal glands.  She was diagnosed with OSA and started on CPAP.  [name] has a history of supraventricular tachycardia (SVT) and has experienced several episodes since her initial hospitalization. She has been unable to tolerate several different beta blockers that have been tried for her condition. She has a history of hyperaldosteronism and underwent a CT scan in 2023, which showed normal adrenal glands without nodules.   At her visit 01/2024 eplerenone  was increased.  MRI of the abdomen revealed a pancreatic cyst 2.1 x 1.9 cm.    Discussed the use of AI scribe software for clinical note transcription with the patient, who gave verbal consent to proceed.  History of Present Illness [name] notes that her blood pressure is well controlled with home readings ranging from 120 to 130 mmHg systolic. She is satisfied with the current management and notes significant improvement. She has a history  of high aldosterone levels, but imaging shows normal adrenal glands without adenomas.  A CT scan in March identified a pancreatic cyst, and an MRI on May 14th showed rapid growth of the cyst.  She has not yet seen a gastroenterologist because when they tried to call her her phone number in the chart was inaccurate.  She is still quite eager to see them.  She is interested in trying to lose weight.  She does not engage in regular exercise and often eats without dietary restrictions. She has not participated in structured weight loss programs.  She recently returned from a vacation in [place] with her sister, where she maintained good hydration and limited alcohol intake.   Previous antihypertensives: Toprol  - hives Hydrochlorothiazide  - gout  Losartan  - switched to valsartan  Lisinopril  - switched to ARB Spironolactone  - chin hair growth    Past Medical History:  Diagnosis Date   Allergy    Anxiety    Arthritis    Back pain    Blood transfusion without reported diagnosis    Cancer of sigmoid colon (HCC)    Constipation    resolved with Miralax    Depression    Dyspnea    pt denies at preop on 11/02/22   Fatigue    Fatty liver    mild   GERD (gastroesophageal reflux disease)    Grade I diastolic dysfunction    Echo 08/08/2017 with Grade I DD, EF 51%, Mild Aortic regurgitation, Mild Tricuspid Regurgitation, Pulmonary pressure 30 mm Hg. She denies any chest pain, dyspnea, or peripheral edema.   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 2008, some issues with short-term memory   Thyroid  disease    Vitamin D   deficiency     Past Surgical History:  Procedure Laterality Date   ABDOMINAL HYSTERECTOMY     ABDOMINOPLASTY/PANNICULECTOMY WITH LIPOSUCTION N/A 09/02/2020   Procedure: PANNICULECTOMY WITH LIPOSUCTION;  Surgeon: Thornell Flirt, DO;  Location: [facility];  Service: Plastics;  Laterality: N/A;  3 hours, please   BREAST LUMPECTOMY WITH RADIOACTIVE SEED LOCALIZATION Left 08/23/2016   Procedure: LEFT BREAST LUMPECTOMY WITH RADIOACTIVE SEED LOCALIZATION;  Surgeon: Enid Harry, MD;  Location: [facility];  Service: General;  Laterality: Left;   BREAST SURGERY  2003   reduction   CESAREAN SECTION     x2   CHALAZION EXCISION Left 2010   COLONOSCOPY W/ POLYPECTOMY  02/16/2018   Dr. Darcella Earnest JOINT INJECTION  2011 and 2012   FLEXIBLE SIGMOIDOSCOPY N/A 04/18/2018   Procedure: FLEXIBLE SIGMOIDOSCOPY;  Surgeon: Melvenia Stabs, MD;  Location: [facility];  Service: General;  Laterality: N/A;   LAPAROSCOPIC SIGMOID COLECTOMY N/A 04/18/2018   Procedure: LAPAROSCOPIC ASSISTED SIGMOIDECTOMY;  Surgeon: Melvenia Stabs, MD;  Location: [facility];  Service: General;  Laterality: N/A;   MYOMECTOMY     MYOMECTOMY     x2   NM MYOCAR PERF WALL MOTION  03/2009   dipyridamole; normal pattern of perfusion in all regions, post-stress EF 65%, normal study    ROBOTIC ASSISTED LAPAROSCOPIC LYSIS OF ADHESION N/A 11/03/2022   Procedure: XI ROBOTIC ASSISTED LAPAROSCOPIC LYSIS OF ADHESION;  Surgeon: Suzi Essex, MD;  Location: [facility];  Service: Gynecology;  Laterality: N/A;   ROBOTIC ASSISTED SALPINGO OOPHERECTOMY Left 11/03/2022   Procedure: XI ROBOTIC ASSISTED UNILATERAL SALPINGO OOPHORECTOMY, CYSTOSCOPY;  Surgeon: Suzi Essex, MD;  Location: [facility];  Service: Gynecology;  Laterality: Left;   THYROIDECTOMY  1989   partial   TRANSTHORACIC ECHOCARDIOGRAM  03/2009   EF=>55%, borderline conc LVH; mild mitral annular calcif, trace MR; trace TR; AV mildly  sclerotic, mild AV regurg; mild pulm valve regurg    TUBAL LIGATION      Current Medications: Current Meds  Medication Sig   albuterol  (VENTOLIN  HFA) 108 (90 Base) MCG/ACT inhaler Inhale 2 puffs into the lungs every 6 (six) hours as needed for wheezing or shortness of breath.   amLODipine -valsartan  (EXFORGE ) 10-320 MG tablet Take 1 tablet by mouth daily.   atorvastatin  (LIPITOR) 80 MG tablet Take 1 tablet (80 mg total) by mouth daily.   carvedilol  (COREG  CR) 40 MG 24 hr capsule TAKE 1 CAPSULE(40 MG) BY MOUTH DAILY   cetirizine (ZYRTEC) 10 MG tablet Take 10 mg by mouth daily as needed for allergies.   Cholecalciferol (VITAMIN D3) 1.25 MG (50000 UT) CAPS Take 50,000 Units by mouth every Saturday.   desvenlafaxine  (PRISTIQ ) 100 MG 24 hr tablet Take 100 mg by mouth daily.   diclofenac  Sodium (VOLTAREN ) 1 % GEL Apply 1 Application topically 4 (four) times daily as needed (pain).  eplerenone  (INSPRA ) 50 MG tablet Take 1 tablet (50 mg total) by mouth daily.   ezetimibe  (ZETIA ) 10 MG tablet Take 1 tablet (10 mg total) by mouth daily.   Fluocinolone  Acetonide Body (DERMA-SMOOTHE /FS BODY) 0.01 % OIL Apply to scalp and leave on 8 hours or overnight as needed for eczema   fluticasone  (FLONASE ) 50 MCG/ACT nasal spray Place 2 sprays into both nostrils daily. (Patient taking differently: Place 2 sprays into both nostrils daily as needed for allergies.)   frovatriptan (FROVA) 2.5 MG tablet Take 2.5 mg by mouth as needed for migraine. If recurs, may repeat after 2 hours. Max of 3 tabs in 24 hours.   gabapentin  (NEURONTIN ) 300 MG capsule Take 600-900 mg by mouth 3 (three) times daily as needed (headaches).   hydrALAZINE  (APRESOLINE ) 50 MG tablet Take 1 tablet (50 mg total) by mouth 3 (three) times daily.   hydrochlorothiazide  (MICROZIDE ) 12.5 MG capsule Take 1 capsule (12.5 mg total) by mouth daily.   LORazepam  (ATIVAN ) 0.5 MG tablet Take 0.5 mg by mouth as needed. Pt say she take for MRI   metoprolol   tartrate (LOPRESSOR ) 100 MG tablet Take 1 tablet (100 mg total) by mouth as directed. Take TWO HOURS prior to CT test.   naproxen sodium (ALEVE) 220 MG tablet Take 220 mg by mouth daily as needed (knee swelling).   OLANZapine (ZYPREXA) 5 MG tablet Take 5 mg by mouth at bedtime as needed (sleep).   omeprazole (PRILOSEC) 40 MG capsule Take 40 mg by mouth daily.   polyethylene glycol (MIRALAX  / GLYCOLAX ) packet Take 17 g by mouth daily.   Tetrahydrozoline HCl (MURINE FOR RED EYES OP) Place 1 drop into both eyes daily as needed (redness).   tolterodine  (DETROL  LA) 2 MG 24 hr capsule TAKE 1 CAPSULE(2 MG) BY MOUTH DAILY   valACYclovir  (VALTREX ) 1000 MG tablet TAKE 1 TABLET(1000 MG) BY MOUTH DAILY FOR SUPPRESSION     Allergies:   Hydrochlorothiazide , Botox [botulinum toxin type a], Onabotulinumtoxina, Spironolactone , Toprol  xl [metoprolol  succinate], and Florette Hurry [ubrogepant]   Social History   Socioeconomic History   Marital status: Married    Spouse name: Joanette Moynahan   Number of children: 2   Years of education: Not on file   Highest education level: Not on file  Occupational History   Occupation: retired    Associate Professor: [employer]  Tobacco Use   Smoking status: Never   Smokeless tobacco: Never  Vaping Use   Vaping status: Never Used  Substance and Sexual Activity   Alcohol use: Yes    Comment: occ   Drug use: No   Sexual activity: Yes    Birth control/protection: Surgical    Comment: Hyst/LSO  Other Topics Concern   Not on file  Social History Narrative   Not on file   Social Drivers of Health   Financial Resource Strain: Low Risk  (08/03/2023)   Overall Financial Resource Strain (CARDIA)    Difficulty of Paying Living Expenses: Not hard at all  Food Insecurity: No Food Insecurity (01/05/2024)   Hunger Vital Sign    Worried About Running Out of Food in the Last Year: Never true    Ran Out of Food in the Last Year: Never true  Transportation Needs: No Transportation Needs  (01/05/2024)   PRAPARE - Administrator, Civil Service (Medical): No    Lack of Transportation (Non-Medical): No  Physical Activity: Inactive (08/03/2023)   Exercise Vital Sign    Days of Exercise per  Week: 0 days    Minutes of Exercise per Session: 0 min  Stress: Not on file  Social Connections: Unknown (04/10/2022)   Received from [facility]   Social Network    Social Network: Not on file     Family History: The patient's family history includes ADD / ADHD in her son; Anxiety disorder in her mother; Breast cancer in her cousin and paternal great-grandmother; Depression in her daughter; Diabetes in her father; Glaucoma in her father; Heart attack in her paternal uncle; Heart disease in her maternal grandfather, maternal grandmother, mother, paternal grandfather, and paternal grandmother; Hyperlipidemia in her mother; Hypertension in her mother and paternal uncle; Prostate cancer in her brother; Stroke in her maternal grandfather and paternal grandmother; Sudden death in her mother; Thyroid  disease in her mother and sister. There is no history of Colon cancer, Stomach cancer, Esophageal cancer, Rectal cancer, Colon polyps, Ovarian cancer, Endometrial cancer, or Pancreatic cancer.  ROS:   Please see the history of present illness.     All other systems reviewed and are negative.  EKGs/Labs/Other Studies Reviewed:    EKG:  EKG is not ordered today.   Recent Labs: 08/03/2023: TSH 0.212 04/02/2024: ALT 63; Hemoglobin 12.1; Platelets 239.0; Pro B Natriuretic peptide (BNP) 54.0 04/18/2024: BUN 14; Creatinine, Ser 0.74; Potassium 3.7; Sodium 144   Recent Lipid Panel    Component Value Date/Time   CHOL 205 (H) 01/03/2024 1001   CHOL 206 (H) 12/19/2013 0820   TRIG 68 01/03/2024 1001   TRIG 111 12/19/2013 0820   HDL 69 01/03/2024 1001   HDL 70 12/19/2013 0820   CHOLHDL 3.0 01/03/2024 1001   CHOLHDL 3 05/23/2022 1447   VLDL 15.4 05/23/2022 1447   LDLCALC 124 (H) 01/03/2024 1001    LDLCALC 114 (H) 12/19/2013 0820    Physical Exam:    VS:  BP 127/74 (BP Location: Right Arm, Patient Position: Sitting, Cuff Size: Normal)   Pulse 64   Wt 218 lb 6.4 oz (99.1 kg)   SpO2 97%   BMI 38.69 kg/m  , BMI Body mass index is 38.69 kg/m. GENERAL:  Well appearing HEENT: Pupils equal round and reactive, fundi not visualized, oral mucosa unremarkable NECK:  No jugular venous distention, waveform within normal limits, carotid upstroke brisk and symmetric, no bruits, no thyromegaly LUNGS:  Clear to auscultation bilaterally HEART:  RRR.  PMI not displaced or sustained,S1 and S2 within normal limits, no S3, no S4, no clicks, no rubs, no murmurs ABD:  Flat, positive bowel sounds normal in frequency in pitch, no bruits, no rebound, no guarding, no midline pulsatile mass, no hepatomegaly, no splenomegaly EXT:  2 plus pulses throughout, no edema, no cyanosis no clubbing SKIN:  No rashes no nodules NEURO:  Cranial nerves II through XII grossly intact, motor grossly intact throughout PSYCH:  Cognitively intact, oriented to person place and time  ASSESSMENT/PLAN:    Assessment and Plan Assessment & Plan # Resistant hypertension She reports multiple medications, but her blood pressure is finally controlled.  Hypertension well-controlled with current medications. Home BP readings 120-130 mmHg systolic. - Continue amlodipine /valsartan , carvedilol , eplerenone , and hydralazine .  # Hyperaldosteronism Hyperaldosteronism managed with eplerenone . Adrenal glands normal on imaging. No adenoma detected.  We did discuss referral to endocrine in possible adrenalectomy.  However given that her blood pressures are stabilized we will defer that for now. - Continue eplerenone .  # Hyperlipidemia Hyperlipidemia managed with Zetia . No changes discussed.  Managed by Dr. Maximo Spar.  - Continue Zetia .  Pancreatic cyst Pancreatic cyst with growth and new lesions on MRI. Requires further evaluation. - Re-refer  to GI - Collect CA 19-9 lab test.  Lifestyle modification Interested in weight loss and lifestyle changes. Current diet not restrictive, limited exercise. - Refer to [employer] program for diet and exercise support.    Disposition:    FU with MD/PharmD in 6 months   Medication Adjustments/Labs and Tests Ordered: Current medicines are reviewed at length with the patient today.  Concerns regarding medicines are outlined above.  Orders Placed This Encounter  Procedures   CA 19-9 (SERIAL)   Amb Referral To Provider Referral Exercise Program (P.R.E.P)   Ambulatory referral to Gastroenterology   No orders of the defined types were placed in this encounter.    Signed, Maudine Sos, MD  05/15/2024 12:57 PM    [facility]

## 2024-05-16 LAB — CA 19-9 (SERIAL): CA 19-9: 21 U/mL (ref 0–35)

## 2024-05-17 ENCOUNTER — Other Ambulatory Visit: Payer: Self-pay

## 2024-05-17 DIAGNOSIS — I1A0 Resistant hypertension: Secondary | ICD-10-CM

## 2024-05-17 DIAGNOSIS — E782 Mixed hyperlipidemia: Secondary | ICD-10-CM

## 2024-05-17 MED ORDER — ATORVASTATIN CALCIUM 80 MG PO TABS: 80.0000 mg | ORAL_TABLET | Freq: Every day | ORAL | 3 refills | Status: AC

## 2024-05-17 MED ORDER — AMLODIPINE BESYLATE-VALSARTAN 10-320 MG PO TABS: 1.0000 | ORAL_TABLET | Freq: Every day | ORAL | 3 refills | Status: AC

## 2024-05-18 ENCOUNTER — Ambulatory Visit: Payer: Self-pay | Admitting: Cardiovascular Disease

## 2024-05-22 ENCOUNTER — Telehealth: Payer: Self-pay

## 2024-06-12 ENCOUNTER — Ambulatory Visit: Admitting: Gastroenterology

## 2024-06-12 ENCOUNTER — Encounter: Payer: Self-pay | Admitting: Gastroenterology

## 2024-06-12 VITALS — BP 144/78 | HR 56 | Ht 62.5 in | Wt 219.4 lb

## 2024-06-12 DIAGNOSIS — F1090 Alcohol use, unspecified, uncomplicated: Secondary | ICD-10-CM | POA: Diagnosis not present

## 2024-06-12 DIAGNOSIS — K862 Cyst of pancreas: Secondary | ICD-10-CM | POA: Diagnosis not present

## 2024-06-12 DIAGNOSIS — Z85038 Personal history of other malignant neoplasm of large intestine: Secondary | ICD-10-CM

## 2024-06-12 NOTE — Progress Notes (Signed)
 GASTROENTEROLOGY OUTPATIENT CLINIC VISIT   Primary Care Provider Copland, Harlene BROCKS, MD [address] [place] [address] [phone]  Patient Profile: [name] is a 73 y.o. female with a pmh significant for hypertension, hyperlipidemia, hypothyroidism (status post thyroidectomy), allergies, migraines, sigmoid colon cancer, colon polyps (adenomas), pancreatic cyst.  The patient presents to the [facility] for an evaluation and management of problem(s) noted below:  Problem List 1. Pancreatic cyst   2. Personal history of colon cancer    Discussed the use of AI scribe software for clinical note transcription with the patient, who gave verbal consent to proceed.  History of Present Illness [name] is a 73 year old female who presents for evaluation of a pancreatic cyst found on recent imaging.  She experiences discomfort in the right lower abdomen a couple of times a week. The discomfort lasts for a few seconds and then resolves, not interfering with her daily activities.  Her bowel movements are regular, typically occurring at 6 AM, and are described as firm but not hard. No changes in stool color, presence of blood, nausea, vomiting, or heartburn symptoms are noted.  She describes no prior history of pancreatic disease being known and no known history of pancreatitis.  She has no family history of pancreatic cancer.  No new updated family history in regard to colon cancer.  GI Review of Systems Positive as above Negative for dysphagia, odynophagia, nausea, vomiting, pain, melena, hematochezia  Review of Systems General: Denies fevers/chills/weight loss unintentionally Cardiovascular: Denies chest pain Pulmonary: Denies shortness of breath Gastroenterological: See HPI Genitourinary: Denies darkened urine Hematological: Denies easy bruising/bleeding Dermatological: Denies jaundice Psychological: Mood is  stable  Medications Current Outpatient Medications  Medication Sig Dispense Refill   albuterol  (VENTOLIN  HFA) 108 (90 Base) MCG/ACT inhaler Inhale 2 puffs into the lungs every 6 (six) hours as needed for wheezing or shortness of breath. 54 g 1   amLODipine -valsartan  (EXFORGE ) 10-320 MG tablet Take 1 tablet by mouth daily. 90 tablet 3   atorvastatin  (LIPITOR) 80 MG tablet Take 1 tablet (80 mg total) by mouth daily. 90 tablet 3   carvedilol  (COREG  CR) 40 MG 24 hr capsule TAKE 1 CAPSULE(40 MG) BY MOUTH DAILY 90 capsule 3   cetirizine (ZYRTEC) 10 MG tablet Take 10 mg by mouth daily as needed for allergies.     Cholecalciferol (VITAMIN D3) 1.25 MG (50000 UT) CAPS Take 50,000 Units by mouth every Saturday.     desvenlafaxine  (PRISTIQ ) 100 MG 24 hr tablet Take 100 mg by mouth daily.     Diclofenac  Potassium,Migraine, 50 MG PACK Take 50 mg by mouth as needed.     diclofenac  Sodium (VOLTAREN ) 1 % GEL Apply 1 Application topically 4 (four) times daily as needed (pain).     eplerenone  (INSPRA ) 50 MG tablet Take 1 tablet (50 mg total) by mouth daily. 90 tablet 3   ezetimibe  (ZETIA ) 10 MG tablet Take 1 tablet (10 mg total) by mouth daily. 90 tablet 3   Fluocinolone  Acetonide Body (DERMA-SMOOTHE /FS BODY) 0.01 % OIL Apply to scalp and leave on 8 hours or overnight as needed for eczema 118 mL 2   fluticasone  (FLONASE ) 50 MCG/ACT nasal spray Place 2 sprays into both nostrils daily. (Patient taking differently: Place 2 sprays into both nostrils daily as needed for allergies.) 16 g 0   frovatriptan (FROVA) 2.5 MG tablet Take 2.5 mg by mouth as needed for migraine. If recurs, may repeat after 2 hours.  Max of 3 tabs in 24 hours.     gabapentin  (NEURONTIN ) 300 MG capsule Take 600-900 mg by mouth 3 (three) times daily as needed (headaches).     hydrALAZINE  (APRESOLINE ) 50 MG tablet Take 1 tablet (50 mg total) by mouth 3 (three) times daily. 270 tablet 3   hydrochlorothiazide  (MICROZIDE ) 12.5 MG capsule Take 1 capsule  (12.5 mg total) by mouth daily. 90 capsule 3   naproxen sodium (ALEVE) 220 MG tablet Take 220 mg by mouth daily as needed (knee swelling).     OLANZapine (ZYPREXA) 5 MG tablet Take 5 mg by mouth at bedtime as needed (sleep).     omeprazole (PRILOSEC) 40 MG capsule Take 40 mg by mouth daily.     polyethylene glycol (MIRALAX  / GLYCOLAX ) packet Take 17 g by mouth daily.     tacrolimus (PROTOPIC) 0.1 % ointment Apply 1 Application topically as needed.     Tetrahydrozoline HCl (MURINE FOR RED EYES OP) Place 1 drop into both eyes daily as needed (redness).     tolterodine  (DETROL  LA) 2 MG 24 hr capsule TAKE 1 CAPSULE(2 MG) BY MOUTH DAILY 90 capsule 0   triamcinolone cream (KENALOG) 0.1 % Apply 1 Application topically as needed.     valACYclovir  (VALTREX ) 1000 MG tablet TAKE 1 TABLET(1000 MG) BY MOUTH DAILY FOR SUPPRESSION 90 tablet 2   No current facility-administered medications for this visit.    Allergies Allergies  Allergen Reactions   Hydrochlorothiazide  Other (See Comments)    Increases risk of gout flares    Botox [Botulinum Toxin Type A] Swelling   Onabotulinumtoxina Swelling   Spironolactone      Increased hair growth on chin/back   Toprol  Xl [Metoprolol  Succinate] Hives   Ubrelvy [Ubrogepant] Hives    Histories Past Medical History:  Diagnosis Date   Allergy    Anxiety    Arthritis    Back pain    Blood transfusion without reported diagnosis    Cancer of sigmoid colon (HCC)    Constipation    resolved with Miralax    Depression    Dyspnea    pt denies at preop on 11/02/22   Fatigue    Fatty liver    mild   GERD (gastroesophageal reflux disease)    Grade I diastolic dysfunction    Echo 08/08/2017 with Grade I DD, EF 51%, Mild Aortic regurgitation, Mild Tricuspid Regurgitation, Pulmonary pressure 30 mm Hg. She denies any chest pain, dyspnea, or peripheral edema.   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, some issues with short-term memory   Thyroid  disease    Vitamin D  deficiency    Past Surgical History:  Procedure Laterality Date   ABDOMINAL HYSTERECTOMY     ABDOMINOPLASTY/PANNICULECTOMY WITH LIPOSUCTION N/A 09/02/2020   Procedure: PANNICULECTOMY WITH LIPOSUCTION;  Surgeon: Lowery Estefana RAMAN, DO;  Location:  SURGERY CENTER;  Service: Plastics;  Laterality: N/A;  3 hours, please   BREAST LUMPECTOMY WITH RADIOACTIVE SEED LOCALIZATION  Left 08/23/2016   Procedure: LEFT BREAST LUMPECTOMY WITH RADIOACTIVE SEED LOCALIZATION;  Surgeon: Donnice Bury, MD;  Location: [facility];  Service: General;  Laterality: Left;   BREAST SURGERY  2003   reduction   CESAREAN SECTION     x2   CHALAZION EXCISION Left 2010   COLONOSCOPY W/ POLYPECTOMY  02/16/2018   Dr. Teressa SAYERS JOINT INJECTION  2011 and 2012   FLEXIBLE SIGMOIDOSCOPY N/A 04/18/2018   Procedure: FLEXIBLE SIGMOIDOSCOPY;  Surgeon: Teresa Lonni HERO, MD;  Location: [facility];  Service: General;  Laterality: N/A;   LAPAROSCOPIC SIGMOID COLECTOMY N/A 04/18/2018   Procedure: LAPAROSCOPIC ASSISTED SIGMOIDECTOMY;  Surgeon: Teresa Lonni HERO, MD;  Location: [facility];  Service: General;  Laterality: N/A;   MYOMECTOMY     MYOMECTOMY     x2   NM MYOCAR PERF WALL MOTION  03/2009   dipyridamole; normal pattern of perfusion in all regions, post-stress EF 65%, normal study    ROBOTIC ASSISTED LAPAROSCOPIC LYSIS OF ADHESION N/A 11/03/2022   Procedure: XI ROBOTIC ASSISTED LAPAROSCOPIC LYSIS OF ADHESION;  Surgeon: Viktoria Comer SAUNDERS, MD;  Location: [facility];  Service: Gynecology;  Laterality: N/A;    ROBOTIC ASSISTED SALPINGO OOPHERECTOMY Left 11/03/2022   Procedure: XI ROBOTIC ASSISTED UNILATERAL SALPINGO OOPHORECTOMY, CYSTOSCOPY;  Surgeon: Viktoria Comer SAUNDERS, MD;  Location: [facility];  Service: Gynecology;  Laterality: Left;   THYROIDECTOMY  1989   partial   TRANSTHORACIC ECHOCARDIOGRAM  03/2009   EF=>55%, borderline conc LVH; mild mitral annular calcif, trace MR; trace TR; AV mildly sclerotic, mild AV regurg; mild pulm valve regurg    TUBAL LIGATION     Social History   Socioeconomic History   Marital status: Married    Spouse name: Gaither   Number of children: 2   Years of education: Not on file   Highest education level: Not on file  Occupational History   Occupation: retired    Associate Professor: [employer]  Tobacco Use   Smoking status: Never   Smokeless tobacco: Never  Vaping Use   Vaping status: Never Used  Substance and Sexual Activity   Alcohol use: Yes    Comment: occ   Drug use: No   Sexual activity: Yes    Birth control/protection: Surgical    Comment: Hyst/LSO  Other Topics Concern   Not on file  Social History Narrative   Not on file   Social Drivers of Health   Financial Resource Strain: Patient Declined (06/03/2024)   Received from [facility]   Overall Financial Resource Strain (CARDIA)    Difficulty of Paying Living Expenses: Patient declined  Food Insecurity: Patient Declined (06/03/2024)   Received from [facility]   Hunger Vital Sign    Within the past 12 months, you worried that your food would run out before you got the money to buy more.: Patient declined    Within the past 12 months, the food you bought just didn't last and you didn't have money to get more.: Patient declined  Transportation Needs: Patient Declined (06/03/2024)   Received from [facility] - Transportation    Lack of Transportation (Medical): Patient declined    Lack of Transportation (Non-Medical): Patient declined  Physical Activity: Inactive (08/03/2023)    Exercise Vital Sign    Days of Exercise per Week: 0 days    Minutes of Exercise per Session: 0 min  Stress: Not on file  Social Connections: Unknown (04/10/2022)   Received from [facility]   Social Network    Social Network: Not on file  Intimate Partner Violence: Not At Risk (01/05/2024)   Humiliation, Afraid, Rape, and Kick questionnaire    Fear of Current or Ex-Partner: No    Emotionally Abused: No    Physically Abused: No    Sexually Abused: No   Family History  Problem Relation Age of Onset   Heart disease Mother        MI, HTN   Hypertension Mother    Thyroid  disease Mother    Hyperlipidemia Mother    Sudden death Mother    Anxiety disorder Mother    Glaucoma Father    Diabetes Father    Thyroid  disease Sister    Prostate cancer Brother    Heart attack Paternal Uncle        HTN   Hypertension Paternal Uncle    Heart disease Maternal Grandmother        MI, CVA   Heart disease Maternal Grandfather        MI   Stroke Maternal Grandfather    Heart disease Paternal Grandmother    Stroke Paternal Grandmother    Heart disease Paternal Grandfather        MI   Depression Daughter    ADD / ADHD Son    Breast cancer Cousin    Breast cancer Paternal Great-grandmother    Colon cancer Neg Hx    Stomach cancer Neg Hx    Esophageal cancer Neg Hx    Rectal cancer Neg Hx    Colon polyps Neg Hx    Ovarian cancer Neg Hx    Endometrial cancer Neg Hx    Pancreatic cancer Neg Hx    Inflammatory bowel disease Neg Hx    Liver disease Neg Hx    I have reviewed her medical, social, and family history in detail and updated the electronic medical record as necessary.    PHYSICAL EXAMINATION  BP (!) 144/78 (BP Location: Left Arm, Patient Position: Sitting, Cuff Size: Large)   Pulse (!) 56 Comment: irregular  Ht 5' 2.5 (1.588 m) Comment: height measured without shoes  Wt 219 lb 6 oz (99.5 kg)   BMI 39.48 kg/m  Wt Readings from Last 3 Encounters:  06/12/24 219 lb 6 oz (99.5  kg)  05/15/24 218 lb 6.4 oz (99.1 kg)  04/09/24 219 lb (99.3 kg)  GEN: NAD, appears stated age, doesn't appear chronically ill PSYCH: Cooperative, without pressured speech EYE: Conjunctivae pink, sclerae anicteric ENT: MMM CV: Nontachycardic RESP: No audible wheezing GI: NABS, soft, NT/ND, without rebound  MSK/EXT: No significant lower extremity edema SKIN: No jaundice NEURO:  Alert & Oriented x 3, no focal deficits   REVIEW OF DATA  I reviewed the following data at the time of this encounter:  GI Procedures and Studies  2023 colonoscopy - The entire examined colon is normal on direct and retroflexion views. - Normal colo-colonic anastomosis in the distal sigmoid region.  Laboratory Studies  Reviewed those in epic  Imaging Studies  May 2025 MRCP IMPRESSION: 1. Unchanged fluid signal cystic lesion arising from the inferior margin of the pancreatic body measuring 2.1 x 1.9 cm. This communicates with the adjacent main pancreatic duct MR. No solid component or suspicious contrast enhancement. Additional tiny fluid signal cysts scattered in the pancreatic tail. 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 Although in general these lesions are exceedingly benign, given rapid growth of the largest lesion, consider EUS/FNA for definitive diagnosis, or alternately  continued MR surveillance in 6 months to 1 year. 2. No acute findings of the abdomen.  March 2025 CT abdomen IMPRESSION: 1. No adrenal mass is identified. 2. New 2.2 by 2.0 by 2.7 cm fluid signal intensity lesion along the inferior margin of the pancreatic body, not present on 09/27/2022. Differential diagnosis includes intraductal papillary mucinous neoplasm (favored) or postinflammatory cystic lesion. Given the relatively rapid appearance of this over the last 1.5 years, gastroenterology referral for endoscopic ultrasound and fine needle aspiration is probably the most appropriate course of action-especially if  the patient has not had pancreatitis in the last 1.5 years. Because of the relatively low risk imaging features, an alternative would be follow up pancreatic protocol MRI starting in 6 months time as a starting point for long-term surveillance. This recommendation follows ACR consensus guidelines: Management of Incidental Pancreatic Cysts: A White Paper of the ACR Incidental Findings Committee. J Am Coll Radiol 2017;14:911-923. 3. Mild cardiomegaly. 4. 3 mm in long axis nonobstructive left kidney upper pole calculus. 5. 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 6. Interbody fusion at T9-10 and severe loss of intervertebral disc height at T10-11. Multiple Schmorl's nodes in the lower thoracic spine and lumbar spine.    May 2023 CT abdomen pelvis IMPRESSION: 1. LEFT ovary without change in size from previous MR imaging. RIGHT ovary not visible. 2. Trabecular thickening and macroscopic fat within the RIGHT pubic root. Raise the question of intraosseous lipoma, hemangioma of bone or Paget's. Compatible with benign process. 3. No adrenal lesion. 4. Liver with question of cirrhotic morphology. No signs of portal hypertension. 5. Nephrolithiasis on the LEFT in the upper pole with 3-4 mm calculus. 6. Post surgical changes in the rectosigmoid colon with anastomosis. No surrounding thickening. Associated postoperative changes in the anterior abdominal wall with some granulation anterior to lower margin of rectus musculature.   ASSESSMENT  [name] is a 73 y.o. female with a pmh significant for hypertension, hyperlipidemia, hypothyroidism (status post thyroidectomy), allergies, migraines, sigmoid colon cancer, colon polyps (adenomas), pancreatic cyst.  The patient is seen today for evaluation and management of:  1. Pancreatic cyst   2. Personal history of colon cancer    The patient is clinically and  hemodynamically stable at this time.  As the cyst has increased in size over interval even though it does not have size criteria of 3 cm or pancreatic ductal dilation, it is reasonable to move forward with an endoscopic ultrasound in an effort of trying to further define what type of cyst this may be.  The risks of an EUS including intestinal perforation, bleeding, infection, aspiration, and medication effects were discussed as was the possibility it may not give a definitive diagnosis if a biopsy is performed.  When a biopsy of the pancreas is done as part of the EUS, there is an additional risk of pancreatitis at the rate of about 1-2%.  It was explained that procedure related pancreatitis is typically mild, although it can be severe and even life threatening, which is why we do not perform random pancreatic biopsies and only biopsy a lesion/area we feel is concerning enough to warrant the risk.  The risks and benefits of endoscopic evaluation were discussed with the patient; these include but are not limited to the risk of perforation, infection, bleeding, missed lesions, lack of diagnosis, severe illness requiring hospitalization, as well as anesthesia and sedation related illnesses.  The patient and/or  family is agreeable to proceed.  All patient questions were answered to the best of my ability, and the patient agrees to the aforementioned plan of action with follow-up as indicated.   PLAN  Proceed with upper EUS Follow-up to be dictated based on results of EUS Colonoscopy for recall in 2028   Orders Placed This Encounter  Procedures   Procedural/ Surgical Case Request: ULTRASOUND, UPPER GI TRACT, ENDOSCOPIC   Ambulatory referral to Gastroenterology    New Prescriptions   No medications on file   Modified Medications   No medications on file    Planned Follow Up No follow-ups on file.   Total Time in Face-to-Face and in Coordination of Care for patient including independent/personal  interpretation/review of prior testing, medical history, examination, medication adjustment, communicating results with the patient directly, and documentation within the EHR is 30 minutes.   Aloha Finner, MD [facility] Office # [phone]

## 2024-06-12 NOTE — Patient Instructions (Signed)
 You have been scheduled for an endoscopy. Please follow written instructions given to you at your visit today.  If you use inhalers (even only as needed), please bring them with you on the day of your procedure.  If you take any of the following medications, they will need to be adjusted prior to your procedure:   DO NOT TAKE 7 DAYS PRIOR TO TEST- Trulicity (dulaglutide) Ozempic, Wegovy (semaglutide) Mounjaro (tirzepatide) Bydureon Bcise (exanatide extended release)  DO NOT TAKE 1 DAY PRIOR TO YOUR TEST Rybelsus (semaglutide) Adlyxin (lixisenatide) Victoza (liraglutide) Byetta (exanatide) ___________________________________________________________________________   _______________________________________________________  If your blood pressure at your visit was 140/90 or greater, please contact your primary care physician to follow up on this.  _______________________________________________________  If you are age 72 or older, your body mass index should be between 23-30. Your Body mass index is 39.48 kg/m. If this is out of the aforementioned range listed, please consider follow up with your Primary Care Provider.  If you are age 2 or younger, your body mass index should be between 19-25. Your Body mass index is 39.48 kg/m. If this is out of the aformentioned range listed, please consider follow up with your Primary Care Provider.   ________________________________________________________  The [facility] providers would like to encourage you to use MYCHART to communicate with providers for non-urgent requests or questions.  Due to long hold times on the telephone, sending your provider a message by [facility] may be a faster and more efficient way to get a response.  Please allow 48 business hours for a response.  Please remember that this is for non-urgent requests.  _______________________________________________________  Thank you for choosing me and [facility].  Dr. Wilhelmenia

## 2024-06-16 ENCOUNTER — Encounter: Payer: Self-pay | Admitting: Gastroenterology

## 2024-06-24 ENCOUNTER — Telehealth: Payer: Self-pay

## 2024-06-24 NOTE — Telephone Encounter (Signed)
 Called about joining the PREP program, she was busy and asked me to return call.

## 2024-06-24 NOTE — Telephone Encounter (Signed)
 Discussed upcoming PREP class which she will join and arranged a time to meet for the initial assessment.

## 2024-07-09 ENCOUNTER — Encounter (HOSPITAL_BASED_OUTPATIENT_CLINIC_OR_DEPARTMENT_OTHER): Payer: Self-pay

## 2024-07-09 NOTE — Telephone Encounter (Signed)
 fyi

## 2024-07-24 ENCOUNTER — Telehealth: Payer: Self-pay

## 2024-07-24 NOTE — Telephone Encounter (Signed)
 Too busy right now to commit to the PREP Program. Told her she can contact me if she is able to join later on.

## 2024-08-07 ENCOUNTER — Telehealth: Payer: Self-pay | Admitting: Gastroenterology

## 2024-08-07 NOTE — Telephone Encounter (Addendum)
 Procedure:Upper EUS Procedure date: 08/15/24 Procedure location: [facility] Arrival Time: 10:15 am Spoke with the patient Y/N: Yes Any prep concerns? No  Has the patient obtained the prep from the pharmacy ? No prep needed Do you have a care partner and transportation: Yes Any additional concerns? No

## 2024-08-09 ENCOUNTER — Encounter (HOSPITAL_COMMUNITY): Payer: Self-pay | Admitting: Gastroenterology

## 2024-08-15 ENCOUNTER — Ambulatory Visit (HOSPITAL_COMMUNITY): Admitting: Anesthesiology

## 2024-08-15 ENCOUNTER — Encounter (HOSPITAL_COMMUNITY)
Admission: RE | Disposition: A | Discharge status: Home / Self Care | Payer: Self-pay | Source: Home / Self Care | Attending: Gastroenterology

## 2024-08-15 ENCOUNTER — Other Ambulatory Visit: Payer: Self-pay

## 2024-08-15 ENCOUNTER — Ambulatory Visit (HOSPITAL_COMMUNITY)
Admission: RE | Admit: 2024-08-15 | Discharge: 2024-08-15 | Disposition: A | Discharge status: Home / Self Care | Attending: Gastroenterology | Admitting: Gastroenterology

## 2024-08-15 ENCOUNTER — Encounter (HOSPITAL_COMMUNITY): Payer: Self-pay | Admitting: Gastroenterology

## 2024-08-15 DIAGNOSIS — K3189 Other diseases of stomach and duodenum: Secondary | ICD-10-CM

## 2024-08-15 DIAGNOSIS — K862 Cyst of pancreas: Secondary | ICD-10-CM

## 2024-08-15 DIAGNOSIS — K449 Diaphragmatic hernia without obstruction or gangrene: Secondary | ICD-10-CM | POA: Diagnosis not present

## 2024-08-15 DIAGNOSIS — K2960 Other gastritis without bleeding: Secondary | ICD-10-CM

## 2024-08-15 DIAGNOSIS — F418 Other specified anxiety disorders: Secondary | ICD-10-CM | POA: Diagnosis not present

## 2024-08-15 DIAGNOSIS — K8689 Other specified diseases of pancreas: Secondary | ICD-10-CM

## 2024-08-15 DIAGNOSIS — I10 Essential (primary) hypertension: Secondary | ICD-10-CM

## 2024-08-15 DIAGNOSIS — K259 Gastric ulcer, unspecified as acute or chronic, without hemorrhage or perforation: Secondary | ICD-10-CM | POA: Diagnosis not present

## 2024-08-15 DIAGNOSIS — I899 Noninfective disorder of lymphatic vessels and lymph nodes, unspecified: Secondary | ICD-10-CM | POA: Diagnosis not present

## 2024-08-15 DIAGNOSIS — Q399 Congenital malformation of esophagus, unspecified: Secondary | ICD-10-CM

## 2024-08-15 DIAGNOSIS — K2289 Other specified disease of esophagus: Secondary | ICD-10-CM | POA: Diagnosis not present

## 2024-08-15 DIAGNOSIS — K219 Gastro-esophageal reflux disease without esophagitis: Secondary | ICD-10-CM | POA: Diagnosis not present

## 2024-08-15 DIAGNOSIS — M1990 Unspecified osteoarthritis, unspecified site: Secondary | ICD-10-CM | POA: Insufficient documentation

## 2024-08-15 HISTORY — PX: FINE NEEDLE ASPIRATION: SHX6590

## 2024-08-15 HISTORY — PX: EUS: SHX5427

## 2024-08-15 HISTORY — PX: ESOPHAGOGASTRODUODENOSCOPY: SHX5428

## 2024-08-15 SURGERY — ULTRASOUND, UPPER GI TRACT, ENDOSCOPIC
Anesthesia: Monitor Anesthesia Care

## 2024-08-15 MED ORDER — CIPROFLOXACIN IN D5W 400 MG/200ML IV SOLN
INTRAVENOUS | Status: AC
  Filled 2024-08-15: qty 200

## 2024-08-15 MED ORDER — CIPROFLOXACIN HCL 500 MG PO TABS: 500.0000 mg | ORAL_TABLET | Freq: Two times a day (BID) | ORAL | 0 refills | Status: AC

## 2024-08-15 MED ORDER — SODIUM CHLORIDE 0.9 % IV SOLN: INTRAVENOUS | Status: DC

## 2024-08-15 MED ORDER — PROPOFOL 500 MG/50ML IV EMUL
INTRAVENOUS | Status: DC | PRN
  Administered 2024-08-15: 150 ug/kg/min via INTRAVENOUS

## 2024-08-15 MED ORDER — CIPROFLOXACIN IN D5W 400 MG/200ML IV SOLN
INTRAVENOUS | Status: DC | PRN
  Administered 2024-08-15: 400 mg via INTRAVENOUS

## 2024-08-15 MED ORDER — PROPOFOL 1000 MG/100ML IV EMUL
INTRAVENOUS | Status: AC
  Filled 2024-08-15: qty 100

## 2024-08-15 MED ORDER — LACTATED RINGERS IV SOLN
INTRAVENOUS | Status: AC | PRN
  Administered 2024-08-15: 1000 mL via INTRAVENOUS

## 2024-08-15 MED ORDER — OMEPRAZOLE 40 MG PO CPDR: 40.0000 mg | DELAYED_RELEASE_CAPSULE | Freq: Two times a day (BID) | ORAL | 12 refills | Status: AC

## 2024-08-15 NOTE — Anesthesia Preprocedure Evaluation (Signed)
 Anesthesia Evaluation  Patient identified by MRN, date of birth, ID band Patient awake    Reviewed: Allergy & Precautions, NPO status , Patient's Chart, lab work & pertinent test results  Airway Mallampati: II  TM Distance: >3 FB Neck ROM: Full    Dental no notable dental hx.    Pulmonary neg pulmonary ROS   Pulmonary exam normal        Cardiovascular hypertension, Pt. on medications and Pt. on home beta blockers  Rhythm:Regular Rate:Normal     Neuro/Psych  Headaches  Anxiety Depression       GI/Hepatic Neg liver ROS,GERD  Medicated,,Pancreatic cyst   Endo/Other  negative endocrine ROS    Renal/GU negative Renal ROS  negative genitourinary   Musculoskeletal  (+) Arthritis , Osteoarthritis,    Abdominal Normal abdominal exam  (+)   Peds  Hematology Lab Results      Component                Value               Date                      WBC                      3.5 (L)             04/02/2024                HGB                      12.1                04/02/2024                HCT                      36.7                04/02/2024                MCV                      89.6                04/02/2024                PLT                      239.0               04/02/2024              Anesthesia Other Findings   Reproductive/Obstetrics                              Anesthesia Physical Anesthesia Plan  ASA: 2  Anesthesia Plan: MAC   Post-op Pain Management:    Induction: Intravenous  PONV Risk Score and Plan: 2 and Treatment may vary due to age or medical condition and Propofol  infusion  Airway Management Planned: Simple Face Mask and Nasal Cannula  Additional Equipment: None  Intra-op Plan:   Post-operative Plan:   Informed Consent: I have reviewed the patients History and Physical, chart, labs and discussed the procedure including the risks, benefits and alternatives for the  proposed anesthesia with the  patient or authorized representative who has indicated his/her understanding and acceptance.     Dental advisory given  Plan Discussed with: CRNA  Anesthesia Plan Comments:         Anesthesia Quick Evaluation

## 2024-08-15 NOTE — Transfer of Care (Signed)
 Immediate Anesthesia Transfer of Care Note  Patient: [name]  Procedure(s) Performed: ULTRASOUND, UPPER GI TRACT, ENDOSCOPIC  Patient Location: PACU  Anesthesia Type:MAC  Level of Consciousness: oriented and sedated  Airway & Oxygen Therapy: Patient Spontanous Breathing and Patient connected to face mask oxygen  Post-op Assessment: Report given to RN and Post -op Vital signs reviewed and stable  Post vital signs: Reviewed and stable  Last Vitals:  Vitals Value Taken Time  BP 114/69 08/15/24 10:44  Temp    Pulse 59 08/15/24 10:45  Resp 22 08/15/24 10:45  SpO2 98 % 08/15/24 10:45  Vitals shown include unfiled device data.  Last Pain:  Vitals:   08/15/24 0940  TempSrc: Temporal  PainSc: 0-No pain         Complications: No notable events documented.

## 2024-08-15 NOTE — Anesthesia Postprocedure Evaluation (Signed)
 Anesthesia Post Note  Patient: [name]  Procedure(s) Performed: ULTRASOUND, UPPER GI TRACT, ENDOSCOPIC     Patient location during evaluation: PACU Anesthesia Type: MAC Level of consciousness: awake and alert Pain management: pain level controlled Vital Signs Assessment: post-procedure vital signs reviewed and stable Respiratory status: spontaneous breathing, nonlabored ventilation, respiratory function stable and patient connected to nasal cannula oxygen Cardiovascular status: stable and blood pressure returned to baseline Postop Assessment: no apparent nausea or vomiting Anesthetic complications: no   No notable events documented.  Last Vitals:  Vitals:   08/15/24 1100 08/15/24 1110  BP: 135/63 (!) 150/88  Pulse: (!) 57 (!) 55  Resp: 19 19  Temp:    SpO2: 97% 98%    Last Pain:  Vitals:   08/15/24 1110  TempSrc:   PainSc: 0-No pain                 Cordella P Vineta Carone

## 2024-08-15 NOTE — H&P (Signed)
 GASTROENTEROLOGY PROCEDURE H&P NOTE   Primary Care Physician: Watt Harlene BROCKS, MD  HPI: [name] is a 73 y.o. female who presents for EGD/EUS for enlarging pancreatic cyst.  Past Medical History:  Diagnosis Date   Allergy    Anxiety    Arthritis    Back pain    Blood transfusion without reported diagnosis    Cancer of sigmoid colon (HCC)    Constipation    resolved with Miralax    Depression    Dyspnea    pt denies at preop on 11/02/22   Fatigue    Fatty liver    mild   GERD (gastroesophageal reflux disease)    Grade I diastolic dysfunction    Echo 08/08/2017 with Grade I DD, EF 51%, Mild Aortic regurgitation, Mild Tricuspid Regurgitation, Pulmonary pressure 30 mm Hg. She denies any chest pain, dyspnea, or peripheral edema.   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, some issues with short-term memory   Thyroid  disease    Vitamin D  deficiency    Past Surgical History:  Procedure Laterality Date   ABDOMINAL HYSTERECTOMY     ABDOMINOPLASTY/PANNICULECTOMY WITH LIPOSUCTION N/A 09/02/2020   Procedure: PANNICULECTOMY WITH LIPOSUCTION;  Surgeon: Lowery Estefana RAMAN, DO;  Location: [facility];  Service: Plastics;  Laterality: N/A;  3 hours, please   BREAST LUMPECTOMY WITH RADIOACTIVE SEED LOCALIZATION Left 08/23/2016   Procedure: LEFT BREAST LUMPECTOMY WITH RADIOACTIVE SEED LOCALIZATION;  Surgeon: Donnice Bury, MD;  Location: [facility];  Service: General;  Laterality: Left;   BREAST SURGERY  2003   reduction   CESAREAN SECTION     x2   CHALAZION  EXCISION Left 2010   COLONOSCOPY W/ POLYPECTOMY  02/16/2018   Dr. Teressa SAYERS JOINT INJECTION  2011 and 2012   FLEXIBLE SIGMOIDOSCOPY N/A 04/18/2018   Procedure: FLEXIBLE SIGMOIDOSCOPY;  Surgeon: Teresa Lonni HERO, MD;  Location: [facility];  Service: General;  Laterality: N/A;   LAPAROSCOPIC SIGMOID COLECTOMY N/A 04/18/2018   Procedure: LAPAROSCOPIC ASSISTED SIGMOIDECTOMY;  Surgeon: Teresa Lonni HERO, MD;  Location: [facility];  Service: General;  Laterality: N/A;   MYOMECTOMY     MYOMECTOMY     x2   NM MYOCAR PERF WALL MOTION  03/2009   dipyridamole; normal pattern of perfusion in all regions, post-stress EF 65%, normal study    ROBOTIC ASSISTED LAPAROSCOPIC LYSIS OF ADHESION N/A 11/03/2022   Procedure: XI ROBOTIC ASSISTED LAPAROSCOPIC LYSIS OF ADHESION;  Surgeon: Viktoria Comer SAUNDERS, MD;  Location: [facility];  Service: Gynecology;  Laterality: N/A;   ROBOTIC ASSISTED SALPINGO OOPHERECTOMY Left 11/03/2022   Procedure: XI ROBOTIC ASSISTED UNILATERAL SALPINGO OOPHORECTOMY, CYSTOSCOPY;  Surgeon: Viktoria Comer SAUNDERS, MD;  Location: [facility];  Service: Gynecology;  Laterality: Left;   THYROIDECTOMY  1989   partial   TRANSTHORACIC ECHOCARDIOGRAM  03/2009   EF=>55%, borderline conc LVH; mild mitral annular calcif, trace MR; trace TR; AV mildly sclerotic, mild AV regurg;  mild pulm valve regurg    TUBAL LIGATION     Current Facility-Administered Medications  Medication Dose Route Frequency Provider Last Rate Last Admin   0.9 %  sodium chloride  infusion   Intravenous Continuous Mansouraty, Aloha Raddle., MD        Current Facility-Administered Medications:    0.9 %  sodium chloride  infusion, , Intravenous, Continuous, Mansouraty, Aloha Raddle., MD Allergies  Allergen Reactions   Hydrochlorothiazide  Other (See Comments)    Increases risk of gout flares    Botox [Botulinum Toxin Type A] Swelling   Onabotulinumtoxina Swelling   Spironolactone      Increased hair growth on chin/back   Toprol  Xl  [Metoprolol  Succinate] Hives   Ubrelvy [Ubrogepant] Hives   Family History  Problem Relation Age of Onset   Heart disease Mother        MI, HTN   Hypertension Mother    Thyroid  disease Mother    Hyperlipidemia Mother    Sudden death Mother    Anxiety disorder Mother    Glaucoma Father    Diabetes Father    Thyroid  disease Sister    Prostate cancer Brother    Heart attack Paternal Uncle        HTN   Hypertension Paternal Uncle    Heart disease Maternal Grandmother        MI, CVA   Heart disease Maternal Grandfather        MI   Stroke Maternal Grandfather    Heart disease Paternal Grandmother    Stroke Paternal Grandmother    Heart disease Paternal Grandfather        MI   Depression Daughter    ADD / ADHD Son    Breast cancer Cousin    Breast cancer Paternal Great-grandmother    Colon cancer Neg Hx    Stomach cancer Neg Hx    Esophageal cancer Neg Hx    Rectal cancer Neg Hx    Colon polyps Neg Hx    Ovarian cancer Neg Hx    Endometrial cancer Neg Hx    Pancreatic cancer Neg Hx    Inflammatory bowel disease Neg Hx    Liver disease Neg Hx    Social History   Socioeconomic History   Marital status: Married    Spouse name: Gaither   Number of children: 2   Years of education: Not on file   Highest education level: Not on file  Occupational History   Occupation: retired    Associate Professor: [employer]  Tobacco Use   Smoking status: Never   Smokeless tobacco: Never  Vaping Use   Vaping status: Never Used  Substance and Sexual Activity   Alcohol use: Yes    Comment: occ   Drug use: No   Sexual activity: Yes    Birth control/protection: Surgical    Comment: Hyst/LSO  Other Topics Concern   Not on file  Social History Narrative   Not on file   Social Drivers of Health   Financial Resource Strain: Patient Declined (06/03/2024)   Received from [facility]   Overall Financial Resource Strain (CARDIA)    Difficulty of Paying Living Expenses: Patient  declined  Food Insecurity: Patient Declined (06/03/2024)   Received [facility]   Hunger Vital Sign    Within the past 12 months, you worried that your food would run out before you got the money to buy more.: Patient declined    Within the past 12 months, the food you bought  just didn't last and you didn't have money to get more.: Patient declined  Transportation Needs: Patient Declined (06/03/2024)   Received from [facility]   PRAPARE - Transportation    Lack of Transportation (Medical): Patient declined    Lack of Transportation (Non-Medical): Patient declined  Physical Activity: Inactive (08/03/2023)   Exercise Vital Sign    Days of Exercise per Week: 0 days    Minutes of Exercise per Session: 0 min  Stress: Not on file  Social Connections: Unknown (04/10/2022)   Received from [facility]   Social Network    Social Network: Not on file  Intimate Partner Violence: Not At Risk (01/05/2024)   Humiliation, Afraid, Rape, and Kick questionnaire    Fear of Current or Ex-Partner: No    Emotionally Abused: No    Physically Abused: No    Sexually Abused: No    Physical Exam: Today's Vitals   08/09/24 1112  Weight: 99 kg   Body mass index is 39.28 kg/m. GEN: NAD EYE: Sclerae anicteric ENT: MMM CV: Non-tachycardic GI: Soft, NT/ND NEURO:  Alert & Oriented x 3  Lab Results: No results for input(s): WBC, HGB, HCT, PLT in the last 72 hours. BMET No results for input(s): NA, K, CL, CO2, GLUCOSE, BUN, CREATININE, CALCIUM  in the last 72 hours. LFT No results for input(s): PROT, ALBUMIN, AST, ALT, ALKPHOS, BILITOT, BILIDIR, IBILI in the last 72 hours. PT/INR No results for input(s): LABPROT, INR in the last 72 hours.   Impression / Plan: This is a 73 y.o.female who presents for EGD/EUS for enlarging pancreatic cyst.  The risks of an EUS including intestinal perforation, bleeding, infection, aspiration, and medication effects were  discussed as was the possibility it may not give a definitive diagnosis if a biopsy is performed.  When a biopsy of the pancreas is done as part of the EUS, there is an additional risk of pancreatitis at the rate of about 1-2%.  It was explained that procedure related pancreatitis is typically mild, although it can be severe and even life threatening, which is why we do not perform random pancreatic biopsies and only biopsy a lesion/area we feel is concerning enough to warrant the risk.  The risks and benefits of endoscopic evaluation/treatment were discussed with the patient and/or family; these include but are not limited to the risk of perforation, infection, bleeding, missed lesions, lack of diagnosis, severe illness requiring hospitalization, as well as anesthesia and sedation related illnesses.  The patient's history has been reviewed, patient examined, no change in status, and deemed stable for procedure.  The patient and/or family is agreeable to proceed.    Aloha Finner, MD [facility] Office # [phone]

## 2024-08-15 NOTE — Discharge Instructions (Signed)

## 2024-08-15 NOTE — Op Note (Signed)
 [facility] Patient Name: [name] Procedure Date: 08/15/2024 MRN: [id number] Attending MD: Aloha Finner , MD, [id number] Date of Birth: 01/08/1951 CSN: [id number] Age: 73 Admit Type: Outpatient Procedure:                Upper EUS Indications:              Pancreatic cyst on MRCP Providers:                Aloha Finner, MD, Robie Breed, RN,                            Lorrayne Kitty, Technician Referring MD:              Medicines:                Monitored Anesthesia Care, Cipro  400 mg IV Complications:            No immediate complications. Estimated Blood Loss:     Estimated blood loss was minimal. Procedure:                Pre-Anesthesia Assessment:                           - Prior to the procedure, a History and Physical                            was performed, and patient medications and                            allergies were reviewed. The patient's tolerance of                            previous anesthesia was also reviewed. The risks                            and benefits of the procedure and the sedation                            options and risks were discussed with the patient.                            All questions were answered, and informed consent                            was obtained. Prior Anticoagulants: The patient has                            taken no anticoagulant or antiplatelet agents. ASA                            Grade Assessment: III - A patient with severe                            systemic disease. After reviewing the risks and  benefits, the patient was deemed in satisfactory                            condition to undergo the procedure.                           After obtaining informed consent, the endoscope was                            passed under direct vision. Throughout the                            procedure, the patient's blood pressure, pulse, and                             oxygen saturations were monitored continuously. The                            [id number]-[id number] ([id number]) Olympus endoscope was introduced                            through the mouth, and advanced to the second part                            of duodenum. The [id number] ([id number]) Olympus                            endosonoscope was introduced through the mouth, and                            advanced to the duodenum for ultrasound examination                            from the stomach and duodenum. The upper EUS was                            accomplished without difficulty. The patient                            tolerated the procedure. Scope In: Scope Out: Findings:      ENDOSCOPIC FINDING: :      The examined esophagus was mildly tortuous.      No gross mucosal lesions were noted in the entire esophagus otherwise.      The Z-line was irregular and was found 37 cm from the incisors.      A 1 cm hiatal hernia was present.      Multiple dispersed small erosions with no bleeding and no stigmata of       recent bleeding were found in the gastric antrum.      No other gross lesions were noted in the entire examined stomach.       Biopsies were taken with a cold forceps for histology and Helicobacter       pylori testing.      No gross lesions were noted in the duodenal bulb, in the first portion  of the duodenum and in the second portion of the duodenum.      The major papilla was normal.      ENDOSONOGRAPHIC FINDING: :      An anechoic lesion suggestive of a cyst was identified in the genu/body.       It communicates with the pancreatic duct. The lesion measured 24 mm by       20 mm in maximal cross-sectional diameter. There was a single       compartment without septae. The outer wall of the lesion was thin. There       was no associated mass. There was no internal debris within the       fluid-filled cavity. Diagnostic needle aspiration for fluid was       performed.  Color Doppler imaging was utilized prior to needle puncture       to confirm a lack of significant vascular structures within the needle       path. One pass was made with the 22 gauge needle using a transgastric       approach. A stylet was used. The amount of fluid collected was 4 mL. The       fluid was clear, white and watery. Sample(s) were sent for chemistry,       amylase concentration and CEA.      Pancreatic parenchymal abnormalities were noted in the entire pancreas.       These consisted of lobularity without honeycombing.      The diameter of the main pancreatic duct (MPD) measured:      - HOP 2.5 mm (head of pancreas)      - NOP 2.0 -> 1.3 mm (neck of pancreas)      - BOP 1.5 mm (body of the pancreas)      - TOP 1.0 mm (tail of the pancreas).      There was no sign of significant endosonographic abnormality in the       common bile duct and in the common hepatic duct. An unremarkable       gallbladder was identified.      Endosonographic imaging in the visualized portion of the liver showed no       mass.      No malignant-appearing lymph nodes were visualized in the celiac region       (level 20), peripancreatic region and porta hepatis region.      The celiac region was visualized. Impression:               EGD impression:                           - Tortuous esophagus. No gross mucosal lesions in                            the entire esophagus otherwise.                           - Z-line irregular, 37 cm from the incisors.                           - 1 cm hiatal hernia.                           - Erosive gastropathy  with no bleeding and no                            stigmata of recent bleeding. No other gross lesions                            in the entire stomach. Biopsied.                           - No gross lesions in the duodenal bulb, in the                            first portion of the duodenum and in the second                            portion of the  duodenum.                           - Normal major papilla.                           EUS impression:                           - A cystic lesion was seen in the genu of the                            pancreas and pancreatic body. Cytology results are                            pending. 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 Fine needle aspiration for fluid                            performed.                           - Pancreatic parenchymal abnormalities consisting                            of lobularity were noted in the entire pancreas.                           - Main pancreatic duct (MPD) diameter was measured.                            Endosonographically, the MPD had a normal                            appearance.                           -  There was no sign of significant pathology in the                            common bile duct and in the common hepatic duct.                           - No malignant-appearing lymph nodes were                            visualized in the celiac region (level 20),                            peripancreatic region and porta hepatis region. Moderate Sedation:      Not Applicable - Patient had care per Anesthesia. Recommendation:           - The patient will be observed post-procedure,                            until all discharge criteria are met.                           - Discharge patient to home.                           - Patient has a contact number available for                            emergencies. The signs and symptoms of potential                            delayed complications were discussed with the                            patient. Return to normal activities tomorrow.                            Written discharge instructions were provided to the                            patient.                            - Low fat diet.                           - Await path results.                           - Pending results likely monitoring and follow-up                            in 6 to 12 months with surveillance MRI/MRCP.                           - Ciprofloxacin  500 mg twice daily for 3  days to                            decrease risk of infection.                           - Continue present medications otherwise.                           - The findings and recommendations were discussed                            with the patient.                           - The findings and recommendations were discussed                            with the patient's family. Procedure Code(s):        --- Professional ---                           [phone], Esophagogastroduodenoscopy, flexible,                            transoral; with transendoscopic ultrasound-guided                            intramural or transmural fine needle                            aspiration/biopsy(s), (includes endoscopic                            ultrasound examination limited to the esophagus,                            stomach or duodenum, and adjacent structures)                           43239, 59, Esophagogastroduodenoscopy, flexible,                            transoral; with biopsy, single or multiple Diagnosis Code(s):        --- Professional ---                           Q39.9, Congenital malformation of esophagus,                            unspecified                           K22.89, Other specified disease of esophagus                           K44.9, Diaphragmatic hernia without obstruction or  gangrene                           K31.89, Other diseases of stomach and duodenum                           K86.2, Cyst of pancreas                           K86.9, Disease of pancreas, unspecified                           I89.9, Noninfective disorder of lymphatic vessels                             and lymph nodes, unspecified CPT copyright 2022 American Medical Association. All rights reserved. The codes documented in this report are preliminary and upon coder review may  be revised to meet current compliance requirements. Aloha Finner, MD 08/15/2024 11:10:41 AM Number of Addenda: 0

## 2024-08-16 ENCOUNTER — Encounter (HOSPITAL_COMMUNITY): Payer: Self-pay | Admitting: Gastroenterology

## 2024-08-16 LAB — SURGICAL PATHOLOGY

## 2024-08-19 ENCOUNTER — Ambulatory Visit: Payer: Self-pay | Admitting: Gastroenterology

## 2024-08-20 ENCOUNTER — Encounter: Payer: Self-pay | Admitting: Gastroenterology

## 2024-08-20 NOTE — Progress Notes (Signed)
 Pancreatic fluid analysis  Amylase 153 units/L CEA 196 ng/mL Glucose less than 2 mg/dL  Based on the findings of this pancreatic fluid analysis, this is most consistent with an IPMN.  We recommend a MRI/MRCP in 1 year for surveillance.  I will forward this to the patient's PCP to be aware of as well.  Should the patient develop new diabetes for some reason, we will update her imaging sooner if necessary.  Patient agrees with this plan of action as I have spoken with her this afternoon. We will print out her report and send it for her records and be scanned into the chart as well.   Aloha Finner, MD [facility] Office # [phone]

## 2024-08-20 NOTE — Progress Notes (Signed)
 Staff message to call pt in 1 year

## 2024-08-27 ENCOUNTER — Encounter: Payer: Self-pay | Admitting: Gastroenterology

## 2024-09-21 ENCOUNTER — Other Ambulatory Visit: Payer: Self-pay | Admitting: Family Medicine

## 2024-09-21 DIAGNOSIS — B009 Herpesviral infection, unspecified: Secondary | ICD-10-CM

## 2024-09-30 ENCOUNTER — Other Ambulatory Visit: Payer: Self-pay | Admitting: Family Medicine

## 2024-09-30 DIAGNOSIS — N3941 Urge incontinence: Secondary | ICD-10-CM

## 2024-10-05 ENCOUNTER — Other Ambulatory Visit: Payer: Self-pay | Admitting: Family Medicine

## 2024-10-05 DIAGNOSIS — N3941 Urge incontinence: Secondary | ICD-10-CM

## 2024-10-07 ENCOUNTER — Other Ambulatory Visit (HOSPITAL_BASED_OUTPATIENT_CLINIC_OR_DEPARTMENT_OTHER): Payer: Self-pay | Admitting: Family

## 2024-10-07 DIAGNOSIS — I1A0 Resistant hypertension: Secondary | ICD-10-CM

## 2024-10-21 ENCOUNTER — Encounter: Payer: Self-pay | Admitting: Family Medicine

## 2024-11-29 ENCOUNTER — Telehealth: Admitting: Physician Assistant

## 2024-11-29 DIAGNOSIS — H1012 Acute atopic conjunctivitis, left eye: Secondary | ICD-10-CM

## 2024-11-29 MED ORDER — FLUTICASONE PROPIONATE 50 MCG/ACT NA SUSP: 2.0000 | Freq: Every day | NASAL | 0 refills | Status: AC

## 2024-11-29 MED ORDER — OLOPATADINE HCL 0.1 % OP SOLN: 1.0000 [drp] | Freq: Two times a day (BID) | OPHTHALMIC | 0 refills | Status: AC

## 2024-11-29 NOTE — Progress Notes (Signed)
 E visit for Allergic Rhinitis We are sorry that you are not feeling well.  Here is how we plan to help!  Based on what you have shared with me, it looks like you have Allergic Rhinitis.  Rhinitis is when a reaction occurs that causes nasal congestion, runny nose, sneezing, and itching.    There are several types of rhinitis, with the most common being acute rhinitis, allergic rhinitis, and nonallergic rhinitis. Acute rhinitis is typically caused by a viral infection. Allergic rhinitis, also known as seasonal rhinitis, occurs at certain times of the year when airborne allergens--such as pollen--trigger the immune system to release histamine. This chemical causes symptoms like itching, swelling, and fluid buildup in the delicate linings of the nasal passages, sinuses, and eyelids. As a result, individuals often experience an itchy nose and a clear nasal discharge. Nonallergic rhinitis, on the other hand, is not triggered by allergens and tends to persist year-round.   You should take a daily antihistamine. Continue Zyrtec as you already are  A nasal spray would also be beneficial. I have prescribed:Flonase  --  2 sprays into each nostril once daily.  You may also benefit from eye drops such as: Olopatadine  0.1%  1 drop into each eye twice daily. This prescription has been sent to your pharmacy.  HOME CARE:  You can use an over-the-counter saline nasal spray as needed. Avoid areas where there is heavy dust, mites, or molds. Stay indoors on windy days during the pollen season. Keep windows closed in home, at least in bedroom; use air conditioner. Keep house pets out of the bedroom or at least off of your bed. Use high-efficiency house air filters in the home. Keep windows closed in car, turn AC on re-circulate. Avoid playing outside with animals during peak pollen season.  GET HELP RIGHT AWAY IF:  If your symptoms do not improve within 10 days. You become short of breath. You develop yellow or  green discharge from your nose for over 3 days. You have coughing fits.  MAKE SURE YOU:  Understand these instructions. Monitor your symptoms and get help right away if anything continues to worsen despite treatment.  Thank you for choosing an e-visit.   Your e-visit answers were reviewed by a board certified advanced clinical practitioner to complete your personal care plan. Depending upon the condition, your plan could have included both over the counter or prescription medications.   Please review your pharmacy choice. Be sure that the pharmacy you have chosen is open so that you can pick up your prescription now.  If there is a problem, you may message your provider in MyChart to have the prescription routed to another pharmacy.   Your safety is important to us . If you have drug allergies, check your prescription carefully.    For the next 24 hours, you can use MyChart to ask questions about todays visit, request a non-urgent call back, or ask for a work or school excuse from your e-visit provider.   You will receive an email in the next two days asking about your experience. I hope that your e-visit has been valuable and will speed up your recovery.    I have spent 5 minutes in review of e-visit questionnaire, review and updating patient chart, medical decision making and response to patient.   Delon CHRISTELLA Dickinson, PA-C

## 2024-12-12 ENCOUNTER — Ambulatory Visit (HOSPITAL_BASED_OUTPATIENT_CLINIC_OR_DEPARTMENT_OTHER): Admitting: Family

## 2024-12-12 ENCOUNTER — Encounter (HOSPITAL_BASED_OUTPATIENT_CLINIC_OR_DEPARTMENT_OTHER): Payer: Self-pay | Admitting: Family

## 2024-12-12 VITALS — BP 116/68 | HR 76 | Ht 63.0 in | Wt 226.0 lb

## 2024-12-12 DIAGNOSIS — I10 Essential (primary) hypertension: Secondary | ICD-10-CM | POA: Diagnosis not present

## 2024-12-12 DIAGNOSIS — Z131 Encounter for screening for diabetes mellitus: Secondary | ICD-10-CM

## 2024-12-12 DIAGNOSIS — Z79899 Other long term (current) drug therapy: Secondary | ICD-10-CM | POA: Diagnosis not present

## 2024-12-12 DIAGNOSIS — G4733 Obstructive sleep apnea (adult) (pediatric): Secondary | ICD-10-CM

## 2024-12-12 DIAGNOSIS — E782 Mixed hyperlipidemia: Secondary | ICD-10-CM

## 2024-12-12 LAB — LIPID PANEL
Chol/HDL Ratio: 1.9 ratio (ref 0.0–4.4)
Cholesterol, Total: 114 mg/dL (ref 100–199)
HDL: 61 mg/dL
LDL Chol Calc (NIH): 39 mg/dL (ref 0–99)
Triglycerides: 65 mg/dL (ref 0–149)
VLDL Cholesterol Cal: 14 mg/dL (ref 5–40)

## 2024-12-12 LAB — BASIC METABOLIC PANEL WITH GFR
BUN/Creatinine Ratio: 20 (ref 12–28)
BUN: 17 mg/dL (ref 8–27)
CO2: 25 mmol/L (ref 20–29)
Calcium: 10.3 mg/dL (ref 8.7–10.3)
Chloride: 103 mmol/L (ref 96–106)
Creatinine, Ser: 0.85 mg/dL (ref 0.57–1.00)
Glucose: 80 mg/dL (ref 70–99)
Potassium: 4.1 mmol/L (ref 3.5–5.2)
Sodium: 145 mmol/L — ABNORMAL HIGH (ref 134–144)
eGFR: 73 mL/min/1.73

## 2024-12-12 LAB — HEMOGLOBIN A1C
Est. average glucose Bld gHb Est-mCnc: 131 mg/dL
Hgb A1c MFr Bld: 6.2 % — ABNORMAL HIGH (ref 4.8–5.6)

## 2024-12-12 NOTE — Patient Instructions (Addendum)
 Medication Instructions:   Your physician recommends that you continue on your current medications as directed. Please refer to the Current Medication list given to you today.    Labwork:  TODAY--A1C, LIPIDS, AND BMET      Follow-Up: Please follow up in  6__ months in [facility] CLINIC with Dr. Raford, Reche Finder, NP or Allean Mink PharmD

## 2024-12-12 NOTE — Progress Notes (Signed)
 "  Advanced Hypertension Clinic Assessment:    Date:  12/12/2024   ID:  [name], DOB 25-Jun-1951, MRN [id number]  PCP:  Watt Harlene BROCKS, MD  Cardiologist:  Vinie BROCKS Maxcy, MD  Nephrologist:  Referring MD: Watt Harlene BROCKS, MD   CC: Hypertension  History of Present Illness:    [name] is a 74 y.o. 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   Strong family history of cardiovascular disease.  Myoview 03/2009 and 11/2013 were low risk.  CT abdomen without contrast 08/2022 with normal adrenal glands.  She saw Dr. Maxcy 06/21/2023 noting elevated blood pressure 140/88 despite 4 antihypertensive agents.  Hydralazine  25 mg twice daily was added.  She was recommended to continue amlodipine  10 mg daily, spironolactone  25 mg daily, valsartan  320 mg daily.  Carvedilol  CR 40 mg daily.  Established with [facility] 08/03/23. Diagnosed with hypertension in her early 23s. No tobacco use and rare etoh use. Was following low sodium diet. Amlodipine , Valsartan  were consolidated to Amlodipine -Valsartan  10-320mg  daily. Hydralazine  was increased from 25 to 50mg  BID. Renal duplex 07/2023 with no renal artery stenosis. Renin-aldosterone consistent with hyperaldosteronism but given prior intolerance to Spironolactone , Eplerenone  was started. She was diagnosed with OSA and started on CPAP.   Seen 05/15/24 with BP controlled by home readings and regimen of Amlodipine -Valsartan , Carvedilol , Eplerenone , Hydralazine . She was re-referred to GI for pancreatic cyst.  She subsequently had endoscopic ultrasound of the pancreatic cyst consistent with an IPMN.   Presents today for follow up. She has been doing well since her last visit. Today she is 116/68 in the office, she reports her blood  pressure readings at home are 120s/70s. She is compliant with her HTN medications and reports no side effects.   She is interested in re-checking her A1c today and the possibility of starting a GLP-1. Last A1c 2/25 was 5.8. Notes interruption in exercise routine due to toe injury which is improving. She notes her eating habits are out of routine after the holidays. Discussed need for lifestyle changes along with possible GLP1.  Denies any chest pain, palpitations, shortness of breath, orthopnea, N/V, and early satiety.   Previous antihypertensives: Toprol  - hives Hydrochlorothiazide  - gout  Losartan  - switched to valsartan  Lisinopril  - switched to ARB Spironolactone  - chin hair growth   Past Medical History:  Diagnosis Date   Allergy    Anxiety    Arthritis    Back pain    Blood transfusion without reported diagnosis    Cancer of sigmoid colon (HCC)    Constipation    resolved with Miralax    Depression    Dyspnea    pt denies at preop on 11/02/22   Fatigue    Fatty liver    mild   GERD (gastroesophageal reflux disease)    Grade I diastolic dysfunction    Echo 08/08/2017 with Grade I DD, EF 51%, Mild Aortic regurgitation, Mild Tricuspid Regurgitation, Pulmonary pressure 30 mm Hg. She denies any chest pain, dyspnea, or peripheral edema.   Headaches due to old head injury    History of kidney stones    History of stomach ulcers    Hyperaldosteronism    Hyperlipidemia    Hypertension    Lactose intolerance    Left hip pain    Left thyroid  nodule  Lipoma of extremity 10/17/2017   Left lower   Migraines    Obesity    Pancreatic cyst 05/15/2024   Panniculitis    Pneumonia    Pre-diabetes    TBI (traumatic brain injury) (HCC)    fell in 2008, some issues with short-term memory   Thyroid  disease    Vitamin D  deficiency     Past Surgical History:  Procedure Laterality Date   ABDOMINAL HYSTERECTOMY     ABDOMINOPLASTY/PANNICULECTOMY WITH LIPOSUCTION N/A 09/02/2020    Procedure: PANNICULECTOMY WITH LIPOSUCTION;  Surgeon: Lowery Estefana RAMAN, DO;  Location: [facility];  Service: Plastics;  Laterality: N/A;  3 hours, please   BREAST LUMPECTOMY WITH RADIOACTIVE SEED LOCALIZATION Left 08/23/2016   Procedure: LEFT BREAST LUMPECTOMY WITH RADIOACTIVE SEED LOCALIZATION;  Surgeon: Donnice Bury, MD;  Location: [facility];  Service: General;  Laterality: Left;   BREAST SURGERY  2003   reduction   CESAREAN SECTION     x2   CHALAZION EXCISION Left 2010   COLONOSCOPY W/ POLYPECTOMY  02/16/2018   Dr. Teressa   ESOPHAGOGASTRODUODENOSCOPY N/A 08/15/2024   Procedure: EGD (ESOPHAGOGASTRODUODENOSCOPY);  Surgeon: Wilhelmenia Aloha Raddle., MD;  Location: [facility];  Service: Gastroenterology;  Laterality: N/A;   EUS N/A 08/15/2024   Procedure: ULTRASOUND, UPPER GI TRACT, ENDOSCOPIC;  Surgeon: Wilhelmenia Aloha Raddle., MD;  Location: [facility];  Service: Gastroenterology;  Laterality: N/A;   FACET JOINT INJECTION  2011 and 2012   FINE NEEDLE ASPIRATION  08/15/2024   Procedure: FINE NEEDLE ASPIRATION;  Surgeon: Wilhelmenia Aloha Raddle., MD;  Location: [facility];  Service: Gastroenterology;;   ENID SIGMOIDOSCOPY N/A 04/18/2018   Procedure: FLEXIBLE SIGMOIDOSCOPY;  Surgeon: Teresa Lonni HERO, MD;  Location: [facility];  Service: General;  Laterality: N/A;   LAPAROSCOPIC SIGMOID COLECTOMY N/A 04/18/2018   Procedure: LAPAROSCOPIC ASSISTED SIGMOIDECTOMY;  Surgeon: Teresa Lonni HERO, MD;  Location: [facility];  Service: General;  Laterality: N/A;   MYOMECTOMY     MYOMECTOMY     x2   NM MYOCAR PERF WALL MOTION  03/2009   dipyridamole; normal pattern of perfusion in all regions, post-stress EF 65%, normal study    ROBOTIC ASSISTED LAPAROSCOPIC LYSIS OF ADHESION N/A 11/03/2022   Procedure: XI ROBOTIC ASSISTED LAPAROSCOPIC LYSIS OF ADHESION;  Surgeon: Viktoria Comer SAUNDERS, MD;  Location: [facility];  Service: Gynecology;  Laterality: N/A;   ROBOTIC  ASSISTED SALPINGO OOPHERECTOMY Left 11/03/2022   Procedure: XI ROBOTIC ASSISTED UNILATERAL SALPINGO OOPHORECTOMY, CYSTOSCOPY;  Surgeon: Viktoria Comer SAUNDERS, MD;  Location: [facility];  Service: Gynecology;  Laterality: Left;   THYROIDECTOMY  1989   partial   TRANSTHORACIC ECHOCARDIOGRAM  03/2009   EF=>55%, borderline conc LVH; mild mitral annular calcif, trace MR; trace TR; AV mildly sclerotic, mild AV regurg; mild pulm valve regurg    TUBAL LIGATION      Current Medications: Current Meds  Medication Sig   albuterol  (VENTOLIN  HFA) 108 (90 Base) MCG/ACT inhaler Inhale 2 puffs into the lungs every 6 (six) hours as needed for wheezing or shortness of breath.   amLODipine -valsartan  (EXFORGE ) 10-320 MG tablet Take 1 tablet by mouth daily.   atorvastatin  (LIPITOR) 80 MG tablet Take 1 tablet (80 mg total) by mouth daily.   carvedilol  (COREG  CR) 40 MG 24 hr capsule TAKE 1 CAPSULE(40 MG) BY MOUTH DAILY   cetirizine (ZYRTEC) 10 MG tablet Take 10 mg by mouth daily as needed for allergies.   Cholecalciferol (VITAMIN D3) 1.25 MG (50000 UT) CAPS Take 50,000 Units  by mouth every Saturday.   cyclobenzaprine  (FEXMID ) 7.5 MG tablet Take 7.5 mg by mouth 3 (three) times daily as needed for muscle spasms.   desvenlafaxine  (PRISTIQ ) 100 MG 24 hr tablet Take 100 mg by mouth daily.   Diclofenac  Potassium,Migraine, 50 MG PACK Take 50 mg by mouth as needed.   diclofenac  Sodium (VOLTAREN ) 1 % GEL Apply 1 Application topically 4 (four) times daily as needed (pain).   eplerenone  (INSPRA ) 50 MG tablet Take 1 tablet (50 mg total) by mouth daily.   ezetimibe  (ZETIA ) 10 MG tablet Take 1 tablet (10 mg total) by mouth daily.   Fluocinolone  Acetonide Body (DERMA-SMOOTHE /FS BODY) 0.01 % OIL Apply to scalp and leave on 8 hours or overnight as needed for eczema   fluticasone  (FLONASE ) 50 MCG/ACT nasal spray Place 2 sprays into both nostrils daily.   frovatriptan (FROVA) 2.5 MG tablet Take 2.5 mg by mouth as needed for migraine. If  recurs, may repeat after 2 hours. Max of 3 tabs in 24 hours.   gabapentin  (NEURONTIN ) 300 MG capsule Take 600-900 mg by mouth 3 (three) times daily as needed (headaches).   hydrALAZINE  (APRESOLINE ) 50 MG tablet TAKE 1 TABLET(50 MG) BY MOUTH THREE TIMES DAILY   hydrochlorothiazide  (MICROZIDE ) 12.5 MG capsule Take 1 capsule (12.5 mg total) by mouth daily.   naproxen sodium (ALEVE) 220 MG tablet Take 220 mg by mouth daily as needed (knee swelling).   OLANZapine (ZYPREXA) 5 MG tablet Take 5 mg by mouth at bedtime as needed (sleep).   olopatadine  (PATANOL) 0.1 % ophthalmic solution Place 1 drop into both eyes 2 (two) times daily.   omeprazole  (PRILOSEC) 40 MG capsule Take 1 capsule (40 mg total) by mouth 2 (two) times daily before a meal.   polyethylene glycol (MIRALAX  / GLYCOLAX ) packet Take 17 g by mouth daily.   tacrolimus (PROTOPIC) 0.1 % ointment Apply 1 Application topically as needed.   Tetrahydrozoline HCl (MURINE FOR RED EYES OP) Place 1 drop into both eyes daily as needed (redness).   tolterodine  (DETROL  LA) 2 MG 24 hr capsule TAKE 1 CAPSULE(2 MG) BY MOUTH DAILY   triamcinolone cream (KENALOG) 0.1 % Apply 1 Application topically as needed.   valACYclovir  (VALTREX ) 1000 MG tablet TAKE 1 TABLET(1000 MG) BY MOUTH DAILY FOR SUPPRESSION     Allergies:   Hydrochlorothiazide , Botox [botulinum toxin type a], Onabotulinumtoxina, Spironolactone , Toprol  xl [metoprolol  succinate], and Holland [ubrogepant]   Social History   Socioeconomic History   Marital status: Married    Spouse name: Gaither   Number of children: 2   Years of education: Not on file   Highest education level: Not on file  Occupational History   Occupation: retired    Associate Professor: [employer]  Tobacco Use   Smoking status: Never    Passive exposure: Never   Smokeless tobacco: Never  Vaping Use   Vaping status: Never Used  Substance and Sexual Activity   Alcohol use: Yes    Comment: occ   Drug use: No   Sexual  activity: Yes    Birth control/protection: Surgical    Comment: Hyst/LSO  Other Topics Concern   Not on file  Social History Narrative   Not on file   Social Drivers of Health   Tobacco Use: Low Risk (12/12/2024)   Patient History    Smoking Tobacco Use: Never    Smokeless Tobacco Use: Never    Passive Exposure: Never  Financial Resource Strain: Low Risk (10/06/2024)   Received  [facility]   Overall Financial Resource Strain (CARDIA)    How hard is it for you to pay for the very basics like food, housing, medical care, and heating?: Not hard at all  Food Insecurity: No Food Insecurity (10/06/2024)   Received from [facility]   Epic    Within the past 12 months, you worried that your food would run out before you got the money to buy more.: Never true    Within the past 12 months, the food you bought just didn't last and you didn't have money to get more.: Never true  Transportation Needs: No Transportation Needs (10/06/2024)   Received from [facility]    In the past 12 months, has lack of transportation kept you from medical appointments or from getting medications?: No    In the past 12 months, has lack of transportation kept you from meetings, work, or from getting things needed for daily living?: No  Physical Activity: Insufficiently Active (10/06/2024)   Received from [facility]   Exercise Vital Sign    On average, how many days per week do you engage in moderate to strenuous exercise (like a brisk walk)?: 1 day    On average, how many minutes do you engage in exercise at this level?: 30 min  Stress: No Stress Concern Present (10/06/2024)   Received from [facility] - Occupational Stress Questionnaire    Do you feel stress - tense, restless, nervous, or anxious, or unable to sleep at night because your mind is troubled all the time - these days?: Not at all  Social Connections: Moderately Integrated (10/06/2024)    Received from [facility]   Social Network    How would you rate your social network (family, work, friends)?: Adequate participation with social networks  Depression (PHQ2-9): Low Risk (04/02/2024)   Depression (PHQ2-9)    PHQ-2 Score: 0  Alcohol Screen: Low Risk (08/03/2023)   Alcohol Screen    Last Alcohol Screening Score (AUDIT): 2  Housing: Low Risk (10/06/2024)   Received from [facility]    In the last 12 months, was there a time when you were not able to pay the mortgage or rent on time?: No    In the past 12 months, how many times have you moved where you were living?: 0    At any time in the past 12 months, were you homeless or living in a shelter (including now)?: No  Utilities: Not At Risk (10/06/2024)   Received from [facility]    In the past 12 months has the electric, gas, oil, or water  company threatened to shut off services in your home?: No  Health Literacy: Not on file     Family History: The patient's family history includes ADD / ADHD in her son; Anxiety disorder in her mother; Breast cancer in her cousin and paternal great-grandmother; Depression in her daughter; Diabetes in her father; Glaucoma in her father; Heart attack in her maternal grandfather, maternal grandmother, maternal uncle, mother, and paternal uncle; Heart disease in her maternal grandfather, maternal grandmother, mother, paternal grandfather, and paternal grandmother; Hyperlipidemia in her mother; Hypertension in her mother and paternal uncle; Prostate cancer in her brother; Stroke in her maternal grandfather and paternal grandmother; Sudden death in her mother; Thyroid  disease in her mother and sister. There is no history of Colon cancer, Stomach cancer, Esophageal cancer, Rectal cancer, Colon  polyps, Ovarian cancer, Endometrial cancer, Pancreatic cancer, Inflammatory bowel disease, or Liver disease.  ROS:   Please see the history of present illness.    All other systems reviewed and  are negative.  EKGs/Labs/Other Studies Reviewed:         Recent Labs: 04/02/2024: ALT 63; Hemoglobin 12.1; Platelets 239.0; Pro B Natriuretic peptide (BNP) 54.0 04/18/2024: BUN 14; Creatinine, Ser 0.74; Potassium 3.7; Sodium 144   Recent Lipid Panel    Component Value Date/Time   CHOL 205 (H) 01/03/2024 1001   CHOL 206 (H) 12/19/2013 0820   TRIG 68 01/03/2024 1001   TRIG 111 12/19/2013 0820   HDL 69 01/03/2024 1001   HDL 70 12/19/2013 0820   CHOLHDL 3.0 01/03/2024 1001   CHOLHDL 3 05/23/2022 1447   VLDL 15.4 05/23/2022 1447   LDLCALC 124 (H) 01/03/2024 1001   LDLCALC 114 (H) 12/19/2013 0820    Physical Exam:   VS:  BP 116/68 (BP Location: Left Arm, Patient Position: Sitting, Cuff Size: Large)   Pulse 76   Ht 5' 3 (1.6 m)   Wt 226 lb (102.5 kg)   SpO2 97%   BMI 40.03 kg/m  , BMI Body mass index is 40.03 kg/m. GENERAL:  Well appearing, overweight HEENT: Pupils equal round and reactive, fundi not visualized, oral mucosa unremarkable NECK:  No jugular venous distention, waveform within normal limits, carotid upstroke brisk and symmetric, no bruits, no thyromegaly LYMPHATICS:  No cervical adenopathy LUNGS:  Clear to auscultation bilaterally HEART:  RRR.  PMI not displaced or sustained,S1 and S2 within normal limits, no S3, no S4, no clicks, no rubs, no murmurs ABD:  Flat, positive bowel sounds normal in frequency in pitch, no bruits, no rebound, no guarding, no midline pulsatile mass, no hepatomegaly, no splenomegaly EXT:  2 plus pulses throughout, no edema, no cyanosis no clubbing SKIN:  No rashes no nodules NEURO:  Cranial nerves II through XII grossly intact, motor grossly intact throughout PSYCH:  Cognitively intact, oriented to person place and time   ASSESSMENT/PLAN:    HTN / Hyperaldosteronism - Well controlled at goal <130/80, she is 116/68 in the office today. Current regimen is amlodipine -valsartan  10-320 mg daily, carvedilol  40 mg 24 hr daily, eplerenone  50 mg  daily, hydralazine  50 mg TID, and hydrochlorothiazide  12.5 mg daily. Will repeat BMET today. Encouraged to continue to monitor BPs at home.   OSA - She is complaint with CPAP without issues or concerns.  HLD, LDL goal <70 - Last LDL on 2/25 was 124, total 205. She is currently on atorvastatin  80 mg daily. She has been fasting since 10 am, will repeat lipids. Consider adding zetia  if LDL >100.   Venous insufficiency - Euvolemic on exam today without symptoms of claudication. Had ABIs 12/2023 that were normal (L 1.01, R 1.09).   5. DM screening / Obesity: She has concerns of  developing DM. Last A1c 5.8 2/25. Will recheck today. Consider adding GLP-1 based on results. If A1c not diabetic, can submit for prior auth for Wegovy or Zepbound for weight. Weight loss via diet and exercise encouraged. Discussed the impact being overweight would have on cardiovascular risk.   5. Disposition: f/u labs, f/u in office in 6 months or sooner if necessary.   Screening for Secondary Hypertension:     08/03/2023   10:07 AM  Causes  Renovascular HTN Screened     - Comments 07/2023 renal duplex ordered.  Sleep Apnea Screened     - Comments 07/2023 sleep  study ordered.  Thyroid  Disease Screened     - Comments 07/2023 thyroid  panel ordered.  Hyperaldosteronism Screened     - Comments 07/2023 renin aldosterone ordered.  Pheochromocytoma N/A     - Comments CT abdomen 08/2022 normal adrenals.  Coarctation of the Aorta N/A     - Comments BP symmetrical.    Relevant Labs/Studies:    Latest Ref Rng & Units 04/18/2024    9:13 AM 04/02/2024   10:18 AM 02/21/2024    1:31 PM  Basic Labs  Sodium 134 - 144 mmol/L 144  142  143   Potassium 3.5 - 5.2 mmol/L 3.7  4.2  3.8   Creatinine 0.57 - 1.00 mg/dL 9.25  9.19  9.26        Latest Ref Rng & Units 08/03/2023   10:55 AM 05/23/2022    2:47 PM  Thyroid    TSH 0.450 - 4.500 uIU/mL 0.212  0.74        Latest Ref Rng & Units 08/03/2023   10:55 AM  Renin/Aldosterone    Aldosterone 0.0 - 30.0 ng/dL 75.8   Aldos/Renin Ratio 0.0 - 30.0 >144.3           Latest Ref Rng & Units 06/22/2022    8:19 AM 06/14/2022    8:45 AM  Cortisol  Cortisol  ug/dL 1.2  84.7        0/74/7975   10:53 AM  Renovascular   Renal Artery US  Completed Yes        Disposition:    FU with ADV HTN clinic in 6 mos   Medication Adjustments/Labs and Tests Ordered: Current medicines are reviewed at length with the patient today.  Concerns regarding medicines are outlined above.  No orders of the defined types were placed in this encounter.  No orders of the defined types were placed in this encounter.    Signed, Reche GORMAN Finder, NP  12/12/2024 2:24 PM    [facility] "

## 2024-12-13 ENCOUNTER — Ambulatory Visit (HOSPITAL_BASED_OUTPATIENT_CLINIC_OR_DEPARTMENT_OTHER): Payer: Self-pay | Admitting: Family

## 2024-12-16 ENCOUNTER — Other Ambulatory Visit (HOSPITAL_COMMUNITY): Payer: Self-pay

## 2024-12-16 ENCOUNTER — Telehealth: Payer: Self-pay | Admitting: Pharmacy Technician

## 2024-12-16 NOTE — Telephone Encounter (Signed)
 Pharmacy Patient Advocate Encounter   Received notification from Physician's Office that prior authorization for Zepbound is required/requested.   Insurance verification completed.   The patient is insured through [facility] ADVANTAGE/RX ADVANCE.   Per test claim: The current 12/06/24 day co-pay is, $1030.63- one month.  No PA needed at this time. This test claim was processed through [facility]- copay amounts may vary at other pharmacies due to pharmacy/plan contracts, or as the patient moves through the different stages of their insurance plan.

## 2024-12-24 MED ORDER — ZEPBOUND 2.5 MG/0.5ML ~~LOC~~ SOAJ: 2.5000 mg | SUBCUTANEOUS | 0 refills | Status: AC

## 2024-12-24 MED ORDER — ZEPBOUND 5 MG/0.5ML ~~LOC~~ SOAJ: 5.0000 mg | SUBCUTANEOUS | 0 refills | Status: AC

## 2024-12-24 MED ORDER — ZEPBOUND 7.5 MG/0.5ML ~~LOC~~ SOAJ: 7.5000 mg | SUBCUTANEOUS | 1 refills | Status: AC

## 2024-12-24 NOTE — Addendum Note (Signed)
 Addended by: Chareese Sergent S on: 12/24/2024 09:37 AM   Modules accepted: Orders
